# Patient Record
Sex: Male | Born: 1945
Health system: Southern US, Community
[De-identification: ages and names within clinical notes are randomized; demographics above are authoritative.]

## PROBLEM LIST (undated history)

## (undated) DIAGNOSIS — I714 Abdominal aortic aneurysm, without rupture, unspecified: Secondary | ICD-10-CM

## (undated) DIAGNOSIS — F0280 Dementia in other diseases classified elsewhere without behavioral disturbance: Secondary | ICD-10-CM

## (undated) DIAGNOSIS — E785 Hyperlipidemia, unspecified: Secondary | ICD-10-CM

## (undated) DIAGNOSIS — R270 Ataxia, unspecified: Secondary | ICD-10-CM

## (undated) DIAGNOSIS — I10 Essential (primary) hypertension: Secondary | ICD-10-CM

## (undated) DIAGNOSIS — Z8719 Personal history of other diseases of the digestive system: Secondary | ICD-10-CM

## (undated) DIAGNOSIS — E559 Vitamin D deficiency, unspecified: Secondary | ICD-10-CM

## (undated) DIAGNOSIS — G309 Alzheimer's disease, unspecified: Secondary | ICD-10-CM

## (undated) DIAGNOSIS — D649 Anemia, unspecified: Secondary | ICD-10-CM

## (undated) DIAGNOSIS — E119 Type 2 diabetes mellitus without complications: Secondary | ICD-10-CM

## (undated) HISTORY — DX: Essential (primary) hypertension: I10

## (undated) HISTORY — DX: Anemia, unspecified: D64.9

## (undated) HISTORY — PX: TONSILLECTOMY: SUR1361

## (undated) HISTORY — PX: INGUINAL HERNIA REPAIR: SUR1180

## (undated) HISTORY — DX: Type 2 diabetes mellitus without complications: E11.9

## (undated) HISTORY — PX: ERCP: SHX60

## (undated) HISTORY — DX: Hyperlipidemia, unspecified: E78.5

## (undated) HISTORY — DX: Personal history of other diseases of the digestive system: Z87.19

## (undated) HISTORY — DX: Abdominal aortic aneurysm, without rupture: I71.4

## (undated) HISTORY — DX: Vitamin D deficiency, unspecified: E55.9

## (undated) HISTORY — DX: Abdominal aortic aneurysm, without rupture, unspecified: I71.40

## (undated) HISTORY — DX: Ataxia, unspecified: R27.0

---

## 1999-01-14 ENCOUNTER — Ambulatory Visit (HOSPITAL_COMMUNITY): Admission: RE | Admit: 1999-01-14 | Discharge: 1999-01-14 | Payer: Self-pay | Admitting: Critical Care Medicine

## 1999-01-14 ENCOUNTER — Encounter: Payer: Self-pay | Admitting: Critical Care Medicine

## 1999-01-20 ENCOUNTER — Ambulatory Visit: Admission: RE | Admit: 1999-01-20 | Discharge: 1999-01-20 | Payer: Self-pay | Admitting: Critical Care Medicine

## 2009-07-23 ENCOUNTER — Encounter: Admission: RE | Admit: 2009-07-23 | Discharge: 2009-10-21 | Payer: Self-pay | Admitting: Family Medicine

## 2012-04-08 ENCOUNTER — Other Ambulatory Visit: Payer: Self-pay | Admitting: Family Medicine

## 2012-04-08 DIAGNOSIS — R4182 Altered mental status, unspecified: Secondary | ICD-10-CM

## 2012-04-09 ENCOUNTER — Ambulatory Visit (HOSPITAL_COMMUNITY)
Admission: RE | Admit: 2012-04-09 | Discharge: 2012-04-09 | Disposition: A | Discharge status: Home / Self Care | Source: Ambulatory Visit | Attending: Family Medicine | Admitting: Family Medicine

## 2012-04-09 DIAGNOSIS — R4182 Altered mental status, unspecified: Secondary | ICD-10-CM | POA: Insufficient documentation

## 2012-04-09 DIAGNOSIS — G319 Degenerative disease of nervous system, unspecified: Secondary | ICD-10-CM | POA: Insufficient documentation

## 2013-01-07 DIAGNOSIS — E119 Type 2 diabetes mellitus without complications: Secondary | ICD-10-CM | POA: Diagnosis not present

## 2013-02-06 ENCOUNTER — Ambulatory Visit (INDEPENDENT_AMBULATORY_CARE_PROVIDER_SITE_OTHER): Payer: Medicare Other | Admitting: Family Medicine

## 2013-02-06 ENCOUNTER — Encounter: Payer: Self-pay | Admitting: Family Medicine

## 2013-02-06 VITALS — BP 108/65 | HR 67 | Temp 97.9°F | Wt 193.4 lb

## 2013-02-06 DIAGNOSIS — I714 Abdominal aortic aneurysm, without rupture, unspecified: Secondary | ICD-10-CM | POA: Diagnosis not present

## 2013-02-06 DIAGNOSIS — G589 Mononeuropathy, unspecified: Secondary | ICD-10-CM | POA: Diagnosis not present

## 2013-02-06 DIAGNOSIS — Z789 Other specified health status: Secondary | ICD-10-CM

## 2013-02-06 DIAGNOSIS — E559 Vitamin D deficiency, unspecified: Secondary | ICD-10-CM | POA: Diagnosis not present

## 2013-02-06 DIAGNOSIS — G629 Polyneuropathy, unspecified: Secondary | ICD-10-CM

## 2013-02-06 DIAGNOSIS — E538 Deficiency of other specified B group vitamins: Secondary | ICD-10-CM | POA: Diagnosis not present

## 2013-02-06 DIAGNOSIS — E1165 Type 2 diabetes mellitus with hyperglycemia: Secondary | ICD-10-CM | POA: Insufficient documentation

## 2013-02-06 DIAGNOSIS — I10 Essential (primary) hypertension: Secondary | ICD-10-CM | POA: Diagnosis not present

## 2013-02-06 DIAGNOSIS — Z87891 Personal history of nicotine dependence: Secondary | ICD-10-CM | POA: Insufficient documentation

## 2013-02-06 DIAGNOSIS — E785 Hyperlipidemia, unspecified: Secondary | ICD-10-CM

## 2013-02-06 DIAGNOSIS — R683 Clubbing of fingers: Secondary | ICD-10-CM | POA: Diagnosis not present

## 2013-02-06 DIAGNOSIS — E1140 Type 2 diabetes mellitus with diabetic neuropathy, unspecified: Secondary | ICD-10-CM | POA: Insufficient documentation

## 2013-02-06 DIAGNOSIS — Z79899 Other long term (current) drug therapy: Secondary | ICD-10-CM | POA: Diagnosis not present

## 2013-02-06 DIAGNOSIS — E119 Type 2 diabetes mellitus without complications: Secondary | ICD-10-CM | POA: Diagnosis not present

## 2013-02-06 DIAGNOSIS — Z87898 Personal history of other specified conditions: Secondary | ICD-10-CM | POA: Insufficient documentation

## 2013-02-06 LAB — POCT GLYCOSYLATED HEMOGLOBIN (HGB A1C): Hemoglobin A1C: 9

## 2013-02-06 MED ORDER — METFORMIN HCL 1000 MG PO TABS: 1000.0000 mg | ORAL_TABLET | Freq: Two times a day (BID) | ORAL | Status: DC

## 2013-02-06 MED ORDER — SAXAGLIPTIN HCL 2.5 MG PO TABS: 5.0000 mg | ORAL_TABLET | Freq: Every day | ORAL | Status: DC

## 2013-02-06 MED ORDER — INSULIN GLARGINE 100 UNIT/ML SOLOSTAR PEN: 60.0000 [IU] | PEN_INJECTOR | Freq: Every day | SUBCUTANEOUS | Status: DC

## 2013-02-06 MED ORDER — INSULIN PEN NEEDLE 32G X 6 MM MISC: 1.0000 | Freq: Every day | Status: DC

## 2013-02-06 MED ORDER — LISINOPRIL 2.5 MG PO TABS: 2.5000 mg | ORAL_TABLET | Freq: Every day | ORAL | Status: DC

## 2013-02-06 NOTE — Patient Instructions (Addendum)
      Dr Manvi Guilliams's Recommendations  Diet and Exercise discussed with patient.  For nutrition information, I recommend books:  1).Eat to Live by Dr Joel Fuhrman. 2).Prevent and Reverse Heart Disease by Dr Caldwell Esselstyn. 3) Dr Neal Barnard's Book:  Program to Reverse Diabetes  Exercise recommendations are:  If unable to walk, then the patient can exercise in a chair 3 times a day. By flapping arms like a bird gently and raising legs outwards to the front.  If ambulatory, the patient can go for walks for 30 minutes 3 times a week. Then increase the intensity and duration as tolerated.  Goal is to try to attain exercise frequency to 5 times a week.  If applicable: Best to perform resistance exercises (machines or weights) 2 days a week and cardio type exercises 3 days per week.  

## 2013-02-06 NOTE — Progress Notes (Signed)
Patient ID: Jon [name], male   DOB: Jul 18, 1945, 67 y.o.   MRN: [id number] SUBJECTIVE: CC: Chief Complaint  Patient presents with  . Follow-up    non fasting  follow up diabetes  wants wriiten rx 90 day     HPI: Patient is here for follow up of Diabetes Mellitus: Symptoms of DM: Denies Nocturia ,Denies Urinary Frequency , denies Blurred vision ,deniesDizziness,denies.Dysuria,denies paresthesias, denies extremity pain or ulcers.Marland Kitchendenies chest pain. has had an annual eye exam. do check the feet. Does check CBGs. Average CBG: 135-150 Denies episodes of hypoglycemia. Does have an emergency hypoglycemic plan. admits toCompliance with medications. Denies Problems with medications.  Gets a stress test and AAA scanned annually in [place].  Past Medical History  Diagnosis Date  . Diabetes mellitus without complication   . AAA (abdominal aortic aneurysm)   . Hypertension   . Anemia   . History of gallstones    Past Surgical History  Procedure Laterality Date  . Ercp     History   Social History  . Marital Status: Married    Spouse Name: N/A    Number of Children: N/A  . Years of Education: N/A   Occupational History  . Not on file.   Social History Main Topics  . Smoking status: Former Smoker    Quit date: 02/06/2009  . Smokeless tobacco: Not on file  . Alcohol Use: Not on file  . Drug Use: Not on file  . Sexually Active: Not on file   Other Topics Concern  . Not on file   Social History Narrative  . No narrative on file   No family history on file. No current outpatient prescriptions on file prior to visit.   No current facility-administered medications on file prior to visit.   No Known Allergies  There is no immunization history on file for this patient. Prior to Admission medications   Medication Sig Start Date End Date Taking? Authorizing Provider  aspirin 81 MG tablet Take 81 mg by mouth daily.   Yes Historical Provider, MD  Chromium-Cinnamon  50-500 MCG-MG CAPS Take 1 capsule by mouth daily.   Yes Historical Provider, MD  Insulin Glargine (LANTUS SOLOSTAR) 100 UNIT/ML SOPN Inject 60 Units into the skin at bedtime.   Yes Historical Provider, MD  Insulin Pen Needle 32G X 6 MM MISC 1 Device by Does not apply route daily.   Yes Historical Provider, MD  lisinopril (PRINIVIL,ZESTRIL) 2.5 MG tablet Take 2.5 mg by mouth daily.   Yes Historical Provider, MD  metFORMIN (GLUCOPHAGE) 1000 MG tablet Take 1,000 mg by mouth 2 (two) times daily with a meal.   Yes Historical Provider, MD  Milk Thistle 1000 MG CAPS Take 1 capsule by mouth daily.   Yes Historical Provider, MD  Saw Palmetto, Serenoa repens, ([employer] SAW PALMETTO) 450 MG CAPS Take 1 capsule by mouth 2 (two) times daily.   Yes Historical Provider, MD  saxagliptin HCl (ONGLYZA) 2.5 MG TABS tablet Take 5 mg by mouth daily.   Yes Historical Provider, MD      ROS: As above in the HPI. All other systems are stable or negative.  OBJECTIVE: APPEARANCE:  Patient in no acute distress.The patient appeared well nourished and normally developed. Acyanotic. Waist: VITAL SIGNS:BP 108/65  Pulse 67  Temp(Src) 97.9 F (36.6 C) (Oral)  Wt 193 lb 6.4 oz (87.726 kg) WM  SKIN: warm and  Dry without overt rashes, tattoos and scars  HEAD and Neck: without JVD, Head  and scalp: normal Eyes:No scleral icterus. Fundi normal, eye movements normal. Ears: Auricle normal, canal normal, Tympanic membranes normal, insufflation normal. Nose: normal Throat: normal Neck & thyroid: normal  CHEST & LUNGS: Chest wall: normal Lungs: Clear  CVS: Reveals the PMI to be normally located. Regular rhythm, First and Second Heart sounds are normal,  absence of murmurs, rubs or gallops. Peripheral vasculature: Radial pulses: normal Dorsal pedis pulses: normal Posterior pulses: normal Clubbing of the finger tips  ABDOMEN:  Appearance: normal Benign, no organomegaly, no masses, no Abdominal Aortic  enlargement. No Guarding , no rebound. No Bruits. Bowel sounds: normal  RECTAL: N/A GU: N/A  EXTREMETIES: nonedematous. Clubbing of the fingers. Both Femoral and Pedal pulses are normal.  MUSCULOSKELETAL:  Spine: normal Joints: intact Clubbing of the finger nails of both hands.  NEUROLOGIC: oriented to time,place and person; nonfocal. Gait:a little off seen by neurologist. Strength is normal Sensory is abnormal: subjective paresthesias of the feet and arms. Reflexes are normal Cranial Nerves are normal.  ASSESSMENT: DM (diabetes mellitus) - Plan: CMP14+EGFR, POCT glycosylated hemoglobin (Hb A1C), POCT UA - Microalbumin, Insulin Glargine (LANTUS SOLOSTAR) 100 UNIT/ML SOPN, Insulin Pen Needle 32G X 6 MM MISC, metFORMIN (GLUCOPHAGE) 1000 MG tablet, saxagliptin HCl (ONGLYZA) 2.5 MG TABS tablet  HTN (hypertension) - Plan: CMP14+EGFR, lisinopril (PRINIVIL,ZESTRIL) 2.5 MG tablet  AAA (abdominal aortic aneurysm) without rupture  Clubbing of fingers - Plan: CBC With differential/Platelet  Ex-smoker  History of unsteady gait  Neuropathy - Plan: Vitamin B12, Folate, Vitamin D 25 hydroxy, TSH  HLD (hyperlipidemia) - Plan: CMP14+EGFR, NMR, lipoprofile  PLAN: Orders Placed This Encounter  Procedures  . CBC With differential/Platelet  . CMP14+EGFR  . NMR, lipoprofile  . Vitamin B12  . Folate  . Vitamin D 25 hydroxy  . TSH  . POCT glycosylated hemoglobin (Hb A1C)  . POCT UA - Microalbumin    Meds ordered this encounter  Medications  . DISCONTD: metFORMIN (GLUCOPHAGE) 1000 MG tablet    Sig: Take 1,000 mg by mouth 2 (two) times daily with a meal.  . DISCONTD: lisinopril (PRINIVIL,ZESTRIL) 2.5 MG tablet    Sig: Take 2.5 mg by mouth daily.  [name] DISCONTD: saxagliptin HCl (ONGLYZA) 2.5 MG TABS tablet    Sig: Take 5 mg by mouth daily.  [name] DISCONTD: Insulin Glargine (LANTUS SOLOSTAR) 100 UNIT/ML SOPN    Sig: Inject 60 Units into the skin at bedtime.  [name] DISCONTD: Insulin Pen Needle  32G X 6 MM MISC    Sig: 1 Device by Does not apply route daily.  . Saw Palmetto, Serenoa repens, ([employer] SAW PALMETTO) 450 MG CAPS    Sig: Take 1 capsule by mouth 2 (two) times daily.  . Chromium-Cinnamon 50-500 MCG-MG CAPS    Sig: Take 1 capsule by mouth daily.  . Milk Thistle 1000 MG CAPS    Sig: Take 1 capsule by mouth daily.  [name] aspirin 81 MG tablet    Sig: Take 81 mg by mouth daily.  . Insulin Glargine (LANTUS SOLOSTAR) 100 UNIT/ML SOPN    Sig: Inject 60 Units into the skin at bedtime.    Dispense:  15 pen    Refill:  11  . Insulin Pen Needle 32G X 6 MM MISC    Sig: 1 Device by Does not apply route daily.    Dispense:  100 each    Refill:  11  . lisinopril (PRINIVIL,ZESTRIL) 2.5 MG tablet    Sig: Take 1 tablet (2.5 mg total) by mouth daily.  Dispense:  90 tablet    Refill:  3  . metFORMIN (GLUCOPHAGE) 1000 MG tablet    Sig: Take 1 tablet (1,000 mg total) by mouth 2 (two) times daily with a meal.    Dispense:  90 tablet    Refill:  3  . saxagliptin HCl (ONGLYZA) 2.5 MG TABS tablet    Sig: Take 2 tablets (5 mg total) by mouth daily.    Dispense:  90 tablet    Refill:  3         Dr Woodroe Mode Recommendations  Diet and Exercise discussed with patient.  For nutrition information, I recommend books:  1).Eat to Live by Dr Monico Hoar. 2).Prevent and Reverse Heart Disease by Dr Suzzette Righter. 3) Dr Katherina Right Book:  Program to Reverse Diabetes  Exercise recommendations are:  If unable to walk, then the patient can exercise in a chair 3 times a day. By flapping arms like a bird gently and raising legs outwards to the front.  If ambulatory, the patient can go for walks for 30 minutes 3 times a week. Then increase the intensity and duration as tolerated.  Goal is to try to attain exercise frequency to 5 times a week.  If applicable: Best to perform resistance exercises (machines or weights) 2 days a week and cardio type exercises 3 days per week.  Return in  about 3 months (around 05/09/2013) for Recheck medical problems.  Sahiti Joswick P. Modesto Charon, M.D.

## 2013-02-08 LAB — VITAMIN D 25 HYDROXY (VIT D DEFICIENCY, FRACTURES): Vit D, 25-Hydroxy: 28.8 ng/mL — ABNORMAL LOW (ref 30.0–100.0)

## 2013-02-08 LAB — CMP14+EGFR
ALT: 45 IU/L — ABNORMAL HIGH (ref 0–44)
AST: 31 IU/L (ref 0–40)
Albumin/Globulin Ratio: 1.3 (ref 1.1–2.5)
Albumin: 4.1 g/dL (ref 3.6–4.8)
Alkaline Phosphatase: 115 IU/L (ref 39–117)
BUN/Creatinine Ratio: 18 (ref 10–22)
BUN: 17 mg/dL (ref 8–27)
CO2: 22 mmol/L (ref 18–29)
Calcium: 9.2 mg/dL (ref 8.6–10.2)
Chloride: 102 mmol/L (ref 97–108)
Creatinine, Ser: 0.94 mg/dL (ref 0.76–1.27)
GFR calc Af Amer: 98 mL/min/{1.73_m2} (ref >59)
GFR calc non Af Amer: 85 mL/min/{1.73_m2} (ref >59)
Globulin, Total: 3.1 g/dL (ref 1.5–4.5)
Glucose: 140 mg/dL — ABNORMAL HIGH (ref 65–99)
Potassium: 4.5 mmol/L (ref 3.5–5.2)
Sodium: 139 mmol/L (ref 134–144)
Total Bilirubin: 0.4 mg/dL (ref 0.0–1.2)
Total Protein: 7.2 g/dL (ref 6.0–8.5)

## 2013-02-08 LAB — NMR, LIPOPROFILE
Cholesterol: 175 mg/dL (ref <200)
HDL Cholesterol by NMR: 54 mg/dL (ref >=40)
HDL Particle Number: 34.7 umol/L (ref >=30.5)
LDL Particle Number: 1362 nmol/L — ABNORMAL HIGH (ref <1000)
LDL Size: 20.6 nm (ref >20.5)
LDLC SERPL CALC-MCNC: 100 mg/dL — ABNORMAL HIGH (ref <100)
LP-IR Score: 57 — ABNORMAL HIGH (ref <=45)
Small LDL Particle Number: 704 nmol/L — ABNORMAL HIGH (ref <=527)
Triglycerides by NMR: 106 mg/dL (ref <150)

## 2013-02-08 LAB — CBC WITH DIFFERENTIAL
Basophils Absolute: 0 10*3/uL (ref 0.0–0.2)
Basos: 0 % (ref 0–3)
Eos: 1 % (ref 0–5)
Eosinophils Absolute: 0.1 10*3/uL (ref 0.0–0.4)
HCT: 43.9 % (ref 37.5–51.0)
Hemoglobin: 15.4 g/dL (ref 12.6–17.7)
Immature Grans (Abs): 0 10*3/uL (ref 0.0–0.1)
Immature Granulocytes: 0 % (ref 0–2)
Lymphocytes Absolute: 3.5 10*3/uL — ABNORMAL HIGH (ref 0.7–3.1)
Lymphs: 33 % (ref 14–46)
MCH: 28.7 pg (ref 26.6–33.0)
MCHC: 35.1 g/dL (ref 31.5–35.7)
MCV: 82 fL (ref 79–97)
Monocytes Absolute: 0.9 10*3/uL (ref 0.1–0.9)
Monocytes: 8 % (ref 4–12)
Neutrophils Absolute: 6.1 10*3/uL (ref 1.4–7.0)
Neutrophils Relative %: 58 % (ref 40–74)
Platelets: 202 10*3/uL (ref 150–379)
RBC: 5.37 x10E6/uL (ref 4.14–5.80)
RDW: 14 % (ref 12.3–15.4)
WBC: 10.8 10*3/uL (ref 3.4–10.8)

## 2013-02-08 LAB — TSH: TSH: 2.68 u[IU]/mL (ref 0.450–4.500)

## 2013-02-08 LAB — FOLATE: Folate: 8.8 ng/mL (ref >3.0)

## 2013-02-08 LAB — VITAMIN B12: Vitamin B-12: 436 pg/mL (ref 211–946)

## 2013-02-27 ENCOUNTER — Telehealth: Payer: Self-pay | Admitting: Family Medicine

## 2013-02-27 NOTE — Telephone Encounter (Signed)
Agree with neurology appointment. Raymir Frommelt P. Modesto Charon, M.D.

## 2013-02-27 NOTE — Telephone Encounter (Signed)
Spoke with wife and she is concerned about his mental altered status and she states he has seen a neurologist . States he cant "figure things out ." Stated he is alert and oriented x 3. But worried about him. Advised to call and sch appt with his neurologist and will let dr Modesto Charon know

## 2013-03-19 ENCOUNTER — Ambulatory Visit (INDEPENDENT_AMBULATORY_CARE_PROVIDER_SITE_OTHER): Payer: Medicare Other | Admitting: Pharmacist

## 2013-03-19 ENCOUNTER — Encounter: Payer: Self-pay | Admitting: Pharmacist

## 2013-03-19 VITALS — BP 116/80 | HR 72 | Ht 72.0 in | Wt 194.0 lb

## 2013-03-19 DIAGNOSIS — R2681 Unsteadiness on feet: Secondary | ICD-10-CM

## 2013-03-19 DIAGNOSIS — E119 Type 2 diabetes mellitus without complications: Secondary | ICD-10-CM

## 2013-03-19 DIAGNOSIS — I10 Essential (primary) hypertension: Secondary | ICD-10-CM

## 2013-03-19 DIAGNOSIS — E785 Hyperlipidemia, unspecified: Secondary | ICD-10-CM

## 2013-03-19 MED ORDER — INSULIN PEN NEEDLE 32G X 4 MM MISC: Status: DC

## 2013-03-19 MED ORDER — GLUCOSE BLOOD VI STRP: ORAL_STRIP | Status: DC

## 2013-03-19 MED ORDER — INSULIN GLARGINE 100 UNIT/ML SOLOSTAR PEN: 60.0000 [IU] | PEN_INJECTOR | Freq: Every day | SUBCUTANEOUS | Status: DC

## 2013-03-19 MED ORDER — SAXAGLIPTIN HCL 5 MG PO TABS: 5.0000 mg | ORAL_TABLET | Freq: Every day | ORAL | Status: DC

## 2013-03-19 NOTE — Progress Notes (Signed)
Diabetes Flow Sheet:  Visit 1  Chief Complaint:   Chief Complaint  Patient presents with  . Diabetes  . Hyperlipidemia     Filed Vitals:   03/19/13 1321  BP: 116/80  Pulse: 72   Filed Weights   03/19/13 1321  Weight: 194 lb (87.998 kg)   HPI:  First visit for diabetes with clinical pharmacist.   A1C was 9.0% - per patient he was eating lots of snack and donuts.  Also LDL-P was eleaveted, triglycerides were normal.    Lantus 60 units Qhs, onzyza 5mg  daily and metformin 1000mg  bid  Exam Edema:  negative  Polyuria:  positive  Polydipsia:  negative Polyphagia:  negative  BMI:  Body mass index is 26.31 kg/(m^2).   Weight changes:  stable General Appearance:  alert, oriented, no acute distress, obese and postive abdominal obesity Mood/Affect:  normal and very talkative.    Low fat/carbohydrate diet?  No Nicotine Abuse?  No Medication Compliance?  Yes Exercise?  No Alcohol Abuse?  No  Checks BG a few times a week, usually only in the morning.  Am 110 - 130.  Lab Results  Component Value Date   HGBA1C 9.0 02/06/2013    Lab Results  Component Value Date   CHOL 175 02/06/2013     Medication Checklist: ACE Inhibitor/ARB?  yes Lipid Lowering Agent?  No Aspirin?  Yes Oral Hypoglycemic Agent(s)?  Yes  Assessment: 1.  type 2 Diabetes.  Uncontrolled and non compliant with ADA CHO counting diet.  2.  Blood Pressure Control.  Normal today 3.  Lipid Control.  Elevated LDL-P  Recommendations: 1.  1800 calorie, carbohydrate counting diet.  Patient is counseled extensively on carbohydrate counting, serving sizes, saturated fat intake and meal planning.  Patient is instructed to eat 3 meals a day and 3 small snacks.  Patient will supplement snacks based on physical activity. 2.  30 minutes of physical activity.  Patient is counseled to always carry glucose tablets, lifesavers, hard candies, etc., while exercising in case of hypoglycemic event. 3.  Patient is counseled on  pathophysiology of diabetes and the risk of long-term complications.  Fasting blood glucose goals are 80-120mg /dL.  Post-prandial goals are < 140.  A1C goals < 7.0%. 4.  LDL goal of < 100, HDL > 40 and TG < 150; BP goal < 130/80 5.  Patient is counseled on proper use of glucometer and lancing device.  Patient instructed to check BG 1 - 2 times daily a varying times and how to respond to unsuitable results. 6.  Medication recommendations at this time are as follows:    Discussed changes and patient refused - feels that diet was main cause of elevated BG.    RTC in 1 month to reassess BG control.  NOTE - patient's wife asked for referral to neurologist for second opinion due to unsteady gait, clubbing of fingernails and changes in memory and comprehension.    Time spent counseling patient:  45 minutes Referring provider:  Modesto Charon   PharmD:  Henrene Pastor, [facility]

## 2013-03-19 NOTE — Patient Instructions (Signed)
Blood Glucose / Sugar Goals - check blood glucose 2 times a day  Fasting 80 to 130  Within 2 hours of start of a meal - less than 180

## 2013-03-31 ENCOUNTER — Telehealth: Payer: Self-pay | Admitting: Family Medicine

## 2013-03-31 NOTE — Telephone Encounter (Signed)
Spoke with Jon [name] in referrals.  Patient has been referred to Dr Jon [name] a neurologist with [name] in [place].  Jon [name] sent referral prior auth to [employer] and we are waiting to hear if it was approved.   Expect to hear in 7 to 14 days (sent 03/24/13). Patient's wife instructed to call our office if they receive letter about approval (we should receive letter, Dr Jon [name] will receive a letter and patient will receive a letter).

## 2013-04-09 ENCOUNTER — Encounter: Payer: Self-pay | Admitting: Family Medicine

## 2013-04-09 ENCOUNTER — Telehealth: Payer: Self-pay | Admitting: Pharmacist

## 2013-04-09 DIAGNOSIS — E119 Type 2 diabetes mellitus without complications: Secondary | ICD-10-CM

## 2013-04-09 MED ORDER — SAXAGLIPTIN HCL 5 MG PO TABS: 5.0000 mg | ORAL_TABLET | Freq: Every day | ORAL | Status: DC

## 2013-04-09 MED ORDER — INSULIN GLARGINE 100 UNIT/ML SOLOSTAR PEN: 60.0000 [IU] | PEN_INJECTOR | Freq: Every day | SUBCUTANEOUS | Status: DC

## 2013-04-09 MED ORDER — INSULIN PEN NEEDLE 32G X 4 MM MISC: Status: DC

## 2013-04-09 MED ORDER — GLUCOSE BLOOD VI STRP: ORAL_STRIP | Status: DC

## 2013-04-09 NOTE — Telephone Encounter (Signed)
Spoke with patient - his wife actually just found rx's Cancelled Rx's printed today.

## 2013-04-11 DIAGNOSIS — R209 Unspecified disturbances of skin sensation: Secondary | ICD-10-CM | POA: Diagnosis not present

## 2013-04-11 DIAGNOSIS — R269 Unspecified abnormalities of gait and mobility: Secondary | ICD-10-CM | POA: Diagnosis not present

## 2013-04-11 DIAGNOSIS — R413 Other amnesia: Secondary | ICD-10-CM | POA: Diagnosis not present

## 2013-04-29 DIAGNOSIS — R413 Other amnesia: Secondary | ICD-10-CM | POA: Diagnosis not present

## 2013-05-08 ENCOUNTER — Other Ambulatory Visit: Payer: Self-pay

## 2013-05-09 ENCOUNTER — Ambulatory Visit (INDEPENDENT_AMBULATORY_CARE_PROVIDER_SITE_OTHER): Payer: Medicare Other | Admitting: Family Medicine

## 2013-05-09 ENCOUNTER — Encounter: Payer: Self-pay | Admitting: Family Medicine

## 2013-05-09 VITALS — BP 109/67 | HR 64 | Temp 98.7°F | Ht 72.0 in | Wt 190.2 lb

## 2013-05-09 DIAGNOSIS — Z87898 Personal history of other specified conditions: Secondary | ICD-10-CM

## 2013-05-09 DIAGNOSIS — I714 Abdominal aortic aneurysm, without rupture, unspecified: Secondary | ICD-10-CM | POA: Diagnosis not present

## 2013-05-09 DIAGNOSIS — E559 Vitamin D deficiency, unspecified: Secondary | ICD-10-CM | POA: Diagnosis not present

## 2013-05-09 DIAGNOSIS — E119 Type 2 diabetes mellitus without complications: Secondary | ICD-10-CM | POA: Diagnosis not present

## 2013-05-09 DIAGNOSIS — E785 Hyperlipidemia, unspecified: Secondary | ICD-10-CM

## 2013-05-09 DIAGNOSIS — Z87891 Personal history of nicotine dependence: Secondary | ICD-10-CM

## 2013-05-09 DIAGNOSIS — R683 Clubbing of fingers: Secondary | ICD-10-CM

## 2013-05-09 DIAGNOSIS — Z789 Other specified health status: Secondary | ICD-10-CM

## 2013-05-09 DIAGNOSIS — I10 Essential (primary) hypertension: Secondary | ICD-10-CM | POA: Diagnosis not present

## 2013-05-09 LAB — POCT UA - MICROALBUMIN: Microalbumin Ur, POC: POSITIVE mg/L

## 2013-05-09 LAB — POCT GLYCOSYLATED HEMOGLOBIN (HGB A1C): Hemoglobin A1C: 7.5

## 2013-05-09 MED ORDER — INSULIN GLARGINE 100 UNIT/ML SOLOSTAR PEN: 60.0000 [IU] | PEN_INJECTOR | Freq: Every day | SUBCUTANEOUS | Status: DC

## 2013-05-09 NOTE — Patient Instructions (Addendum)
    Dr Hamsa Laurich's Recommendations  For nutrition information, I recommend books:  1).Eat to Live by Dr Joel Fuhrman. 2).Prevent and Reverse Heart Disease by Dr Caldwell Esselstyn. 3) Dr Neal Barnard's Book:  Program to Reverse Diabetes  Exercise recommendations are:  If unable to walk, then the patient can exercise in a chair 3 times a day. By flapping arms like a bird gently and raising legs outwards to the front.  If ambulatory, the patient can go for walks for 30 minutes 3 times a week. Then increase the intensity and duration as tolerated.  Goal is to try to attain exercise frequency to 5 times a week.  If applicable: Best to perform resistance exercises (machines or weights) 2 days a week and cardio type exercises 3 days per week.   Diabetes and Foot Care Diabetes may cause you to have problems because of poor blood supply (circulation) to your feet and legs. This may cause the skin on your feet to become thinner, break easier, and heal more slowly. Your skin may become dry, and the skin may peel and crack. You may also have nerve damage in your legs and feet causing decreased feeling in them. You may not notice minor injuries to your feet that could lead to infections or more serious problems. Taking care of your feet is one of the most important things you can do for yourself.  HOME CARE INSTRUCTIONS  Wear shoes at all times, even in the house. Do not go barefoot. Bare feet are easily injured.  Check your feet daily for blisters, cuts, and redness. If you cannot see the bottom of your feet, use a mirror or ask someone for help.  Wash your feet with warm water (do not use hot water) and mild soap. Then pat your feet and the areas between your toes until they are completely dry. Do not soak your feet as this can dry your skin.  Apply a moisturizing lotion or petroleum jelly (that does not contain alcohol and is unscented) to the skin on your feet and to dry, brittle toenails.  Do not apply lotion between your toes.  Trim your toenails straight across. Do not dig under them or around the cuticle. File the edges of your nails with an emery board or nail file.  Do not cut corns or calluses or try to remove them with medicine.  Wear clean socks or stockings every day. Make sure they are not too tight. Do not wear knee-high stockings since they may decrease blood flow to your legs.  Wear shoes that fit properly and have enough cushioning. To break in new shoes, wear them for just a few hours a day. This prevents you from injuring your feet. Always look in your shoes before you put them on to be sure there are no objects inside.  Do not cross your legs. This may decrease the blood flow to your feet.  If you find a minor scrape, cut, or break in the skin on your feet, keep it and the skin around it clean and dry. These areas may be cleansed with mild soap and water. Do not cleanse the area with peroxide, alcohol, or iodine.  When you remove an adhesive bandage, be sure not to damage the skin around it.  If you have a wound, look at it several times a day to make sure it is healing.  Do not use heating pads or hot water bottles. They may burn your skin. If   you have lost feeling in your feet or legs, you may not know it is happening until it is too late.  Make sure your health care provider performs a complete foot exam at least annually or more often if you have foot problems. Report any cuts, sores, or bruises to your health care provider immediately. SEEK MEDICAL CARE IF:   You have an injury that is not healing.  You have cuts or breaks in the skin.  You have an ingrown nail.  You notice redness on your legs or feet.  You feel burning or tingling in your legs or feet.  You have pain or cramps in your legs and feet.  Your legs or feet are numb.  Your feet always feel cold. SEEK IMMEDIATE MEDICAL CARE IF:   There is increasing redness, swelling, or pain in  or around a wound.  There is a red line that goes up your leg.  Pus is coming from a wound.  You develop a fever or as directed by your health care provider.  You notice a bad smell coming from an ulcer or wound. Document Released: 06/16/2000 Document Revised: 02/19/2013 Document Reviewed: 11/26/2012 [employer] Patient Information 2014 [employer].  

## 2013-05-09 NOTE — Addendum Note (Signed)
Addended by: Lisbeth Ply C on: 05/09/2013 11:29 AM   Modules accepted: Orders

## 2013-05-09 NOTE — Progress Notes (Signed)
SUBJECTIVE: CC: Chief Complaint  Patient presents with  . Follow-up    3 month follow up chronic problems wants lantus pen corrected     HPI: Patient is here for follow up of Diabetes Mellitus: Symptoms evaluated: Denies Nocturia ,Denies Urinary Frequency , denies Blurred vision ,deniesDizziness,denies.Dysuria,denies paresthesias, denies extremity pain or ulcers.Jon Kitchendenies chest pain. has had an annual eye exam. do check the feet. Does check CBGs. Average CBG:130-135 Denies episodes of hypoglycemia. Does have an emergency hypoglycemic plan. admits toCompliance with medications. Denies Problems with medications.  Saw neurologist: had work up. Awaiting results. Breakfast: eggs & Sausage, bubbles& sweets (cabbage & potatoes) Lunch: nothing Supper: 2 pizzas Past Medical History  Diagnosis Date  . Diabetes mellitus without complication   . AAA (abdominal aortic aneurysm)   . Hypertension   . Anemia   . History of gallstones   . Vitamin D deficiency    Past Surgical History  Procedure Laterality Date  . Ercp     History   Social History  . Marital Status: Married    Spouse Name: N/A    Number of Children: N/A  . Years of Education: N/A   Occupational History  . Not on file.   Social History Main Topics  . Smoking status: Former Smoker    Quit date: 02/06/2009  . Smokeless tobacco: Not on file  . Alcohol Use: Not on file  . Drug Use: Not on file  . Sexual Activity: Not on file   Other Topics Concern  . Not on file   Social History Narrative  . No narrative on file   No family history on file. Current Outpatient Prescriptions on File Prior to Visit  Medication Sig Dispense Refill  . aspirin 81 MG tablet Take 81 mg by mouth daily.      . Chromium-Cinnamon 50-500 MCG-MG CAPS Take 1 capsule by mouth daily.      Jon Kitchen glucose blood (ACCU-CHEK SMARTVIEW) test strip Use to check BG twice a day.  DX:  250.02 uncontrolled type 2 diabetes.  200 each  2  . Insulin Pen  Needle 32G X 4 MM MISC Use with Lantus Solostar pen daily  100 each  2  . lisinopril (PRINIVIL,ZESTRIL) 2.5 MG tablet Take 1 tablet (2.5 mg total) by mouth daily.  90 tablet  3  . metFORMIN (GLUCOPHAGE) 1000 MG tablet Take 1 tablet (1,000 mg total) by mouth 2 (two) times daily with a meal.  90 tablet  3  . Milk Thistle 1000 MG CAPS Take 1 capsule by mouth daily.      . Saw Palmetto, Serenoa repens, ([facility] SAW PALMETTO) 450 MG CAPS Take 1 capsule by mouth 2 (two) times daily.      . saxagliptin HCl (ONGLYZA) 5 MG TABS tablet Take 1 tablet (5 mg total) by mouth daily.  90 tablet  1   No current facility-administered medications on file prior to visit.   No Known Allergies  There is no immunization history on file for this patient. Prior to Admission medications   Medication Sig Start Date End Date Taking? Authorizing Provider  aspirin 81 MG tablet Take 81 mg by mouth daily.    Historical Provider, MD  Chromium-Cinnamon 50-500 MCG-MG CAPS Take 1 capsule by mouth daily.    Historical Provider, MD  glucose blood (ACCU-CHEK SMARTVIEW) test strip Use to check BG twice a day.  DX:  250.02 uncontrolled type 2 diabetes. 04/09/13   Tammy Eckard, PHARMD  Insulin Glargine (LANTUS SOLOSTAR) 100 UNIT/ML  SOPN Inject 60 Units into the skin at bedtime. 04/09/13   Tammy Eckard, PHARMD  Insulin Pen Needle 32G X 4 MM MISC Use with Lantus Solostar pen daily 04/09/13   Tammy Eckard, PHARMD  lisinopril (PRINIVIL,ZESTRIL) 2.5 MG tablet Take 1 tablet (2.5 mg total) by mouth daily. 02/06/13   Ileana Ladd, MD  metFORMIN (GLUCOPHAGE) 1000 MG tablet Take 1 tablet (1,000 mg total) by mouth 2 (two) times daily with a meal. 02/06/13   Ileana Ladd, MD  Milk Thistle 1000 MG CAPS Take 1 capsule by mouth daily.    Historical Provider, MD  Saw Palmetto, Serenoa repens, ([employer] SAW PALMETTO) 450 MG CAPS Take 1 capsule by mouth 2 (two) times daily.    Historical Provider, MD  saxagliptin HCl (ONGLYZA) 5 MG TABS tablet Take 1 tablet (5 mg  total) by mouth daily. 04/09/13   Tammy Eckard, PHARMD     ROS: As above in the HPI. All other systems are stable or negative.  OBJECTIVE: APPEARANCE:  Patient in no acute distress.The patient appeared well nourished and normally developed. Acyanotic. Waist: VITAL SIGNS:BP 109/67  Pulse 64  Temp(Src) 98.7 F (37.1 C) (Oral)  Ht 6' (1.829 m)  Wt 190 lb 3.2 oz (86.274 kg)  BMI 25.79 kg/m2 WM  SKIN: warm and  Dry without overt rashes, tattoos and scars  HEAD and Neck: without JVD, Head and scalp: normal Eyes:No scleral icterus. Fundi normal, eye movements normal. Ears: Auricle normal, canal normal, Tympanic membranes normal, insufflation normal. Nose: normal Throat: normal Neck & thyroid: normal  CHEST & LUNGS: Chest wall: normal Lungs: Clear  CVS: Reveals the PMI to be normally located. Regular rhythm, First and Second Heart sounds are normal,  absence of murmurs, rubs or gallops. Peripheral vasculature: Radial pulses: normal Dorsal pedis pulses: normal Posterior pulses: normal  ABDOMEN:  Appearance: normal Benign, no organomegaly, no masses, no Abdominal Aortic enlargement. No Guarding , no rebound. No Bruits. Bowel sounds: normal  RECTAL: N/A GU: N/A  EXTREMETIES: nonedematous.  MUSCULOSKELETAL:  Spine: normal Joints: intact  NEUROLOGIC: oriented to time,place and person; nonfocal. Strength is normal Sensory is normal.DM foot exam: intact Reflexes are normal Cranial Nerves are normal.  ASSESSMENT:  DM (diabetes mellitus) - Plan: POCT glycosylated hemoglobin (Hb A1C), POCT UA - Microalbumin, CMP14+EGFR, Insulin Glargine (LANTUS SOLOSTAR) 100 UNIT/ML SOPN, DISCONTINUED: Insulin Glargine (LANTUS SOLOSTAR) 100 UNIT/ML SOPN  HTN (hypertension) - Plan: CMP14+EGFR  AAA (abdominal aortic aneurysm) without rupture  History of unsteady gait  Clubbing of fingers  Ex-smoker  Hyperlipidemia - Plan: NMR, lipoprofile  Vitamin D deficiency - Plan: Vit D   25 hydroxy (rtn osteoporosis monitoring) Work up in progress with neurologist for memory impairment.  PLAN:      Dr Woodroe Mode Recommendations  For nutrition information, I recommend books:  1).Eat to Live by Dr Monico Hoar. 2).Prevent and Reverse Heart Disease by Dr Suzzette Righter. 3) Dr Katherina Right Book:  Program to Reverse Diabetes  Exercise recommendations are:  If unable to walk, then the patient can exercise in a chair 3 times a day. By flapping arms like a bird gently and raising legs outwards to the front.  If ambulatory, the patient can go for walks for 30 minutes 3 times a week. Then increase the intensity and duration as tolerated.  Goal is to try to attain exercise frequency to 5 times a week.  If applicable: Best to perform resistance exercises (machines or weights) 2 days a week and cardio type  exercises 3 days per week.  Orders Placed This Encounter  Procedures  . CMP14+EGFR  . NMR, lipoprofile  . Vit D  25 hydroxy (rtn osteoporosis monitoring)  . POCT glycosylated hemoglobin (Hb A1C)  . POCT UA - Microalbumin   Meds ordered this encounter  Medications  . DISCONTD: Insulin Glargine (LANTUS SOLOSTAR) 100 UNIT/ML SOPN    Sig: Inject 60 Units into the skin at bedtime.    Dispense:  63 mL    Refill:  3  . Insulin Glargine (LANTUS SOLOSTAR) 100 UNIT/ML SOPN    Sig: Inject 60 Units into the skin at bedtime.    Dispense:  63 mL    Refill:  3   Medications Discontinued During This Encounter  Medication Reason  . Insulin Glargine (LANTUS SOLOSTAR) 100 UNIT/ML SOPN Reorder  . Insulin Glargine (LANTUS SOLOSTAR) 100 UNIT/ML SOPN Reorder   Return in about 3 months (around 08/09/2013) for Recheck medical problems.  Jon Hark P. [place] Charon, M.D.

## 2013-05-10 LAB — MICROALBUMIN, URINE: Microalbumin, Urine: 30.6 ug/mL — ABNORMAL HIGH (ref 0.0–17.0)

## 2013-05-11 LAB — CMP14+EGFR
ALT: 27 IU/L (ref 0–44)
AST: 26 IU/L (ref 0–40)
Albumin/Globulin Ratio: 1.5 (ref 1.1–2.5)
Albumin: 4.5 g/dL (ref 3.6–4.8)
Alkaline Phosphatase: 117 IU/L (ref 39–117)
BUN/Creatinine Ratio: 17 (ref 10–22)
BUN: 17 mg/dL (ref 8–27)
CO2: 22 mmol/L (ref 18–29)
Calcium: 9.7 mg/dL (ref 8.6–10.2)
Chloride: 100 mmol/L (ref 97–108)
Creatinine, Ser: 1.03 mg/dL (ref 0.76–1.27)
GFR calc Af Amer: 88 mL/min/{1.73_m2} (ref >59)
GFR calc non Af Amer: 76 mL/min/{1.73_m2} (ref >59)
Globulin, Total: 3.1 g/dL (ref 1.5–4.5)
Glucose: 84 mg/dL (ref 65–99)
Potassium: 4.4 mmol/L (ref 3.5–5.2)
Sodium: 138 mmol/L (ref 134–144)
Total Bilirubin: 0.4 mg/dL (ref 0.0–1.2)
Total Protein: 7.6 g/dL (ref 6.0–8.5)

## 2013-05-11 LAB — NMR, LIPOPROFILE
Cholesterol: 182 mg/dL (ref <200)
HDL Cholesterol by NMR: 57 mg/dL (ref >=40)
HDL Particle Number: 35.3 umol/L (ref >=30.5)
LDL Particle Number: 1390 nmol/L — ABNORMAL HIGH (ref <1000)
LDL Size: 20.8 nm (ref >20.5)
LDLC SERPL CALC-MCNC: 106 mg/dL — ABNORMAL HIGH (ref <100)
LP-IR Score: 48 — ABNORMAL HIGH (ref <=45)
Small LDL Particle Number: 704 nmol/L — ABNORMAL HIGH (ref <=527)
Triglycerides by NMR: 97 mg/dL (ref <150)

## 2013-05-11 LAB — VITAMIN D 25 HYDROXY (VIT D DEFICIENCY, FRACTURES): Vit D, 25-Hydroxy: 29.4 ng/mL — ABNORMAL LOW (ref 30.0–100.0)

## 2013-05-15 DIAGNOSIS — F09 Unspecified mental disorder due to known physiological condition: Secondary | ICD-10-CM | POA: Insufficient documentation

## 2013-05-15 DIAGNOSIS — R209 Unspecified disturbances of skin sensation: Secondary | ICD-10-CM | POA: Diagnosis not present

## 2013-05-15 DIAGNOSIS — R269 Unspecified abnormalities of gait and mobility: Secondary | ICD-10-CM | POA: Diagnosis not present

## 2013-09-13 ENCOUNTER — Other Ambulatory Visit: Payer: Self-pay | Admitting: Pharmacist

## 2013-10-01 DIAGNOSIS — I714 Abdominal aortic aneurysm, without rupture, unspecified: Secondary | ICD-10-CM | POA: Diagnosis not present

## 2013-10-01 DIAGNOSIS — E7800 Pure hypercholesterolemia, unspecified: Secondary | ICD-10-CM | POA: Diagnosis not present

## 2013-10-01 DIAGNOSIS — E1159 Type 2 diabetes mellitus with other circulatory complications: Secondary | ICD-10-CM | POA: Diagnosis not present

## 2013-10-01 DIAGNOSIS — I6529 Occlusion and stenosis of unspecified carotid artery: Secondary | ICD-10-CM | POA: Diagnosis not present

## 2013-10-01 DIAGNOSIS — I798 Other disorders of arteries, arterioles and capillaries in diseases classified elsewhere: Secondary | ICD-10-CM | POA: Diagnosis not present

## 2013-10-01 DIAGNOSIS — I10 Essential (primary) hypertension: Secondary | ICD-10-CM | POA: Diagnosis not present

## 2013-10-01 DIAGNOSIS — I658 Occlusion and stenosis of other precerebral arteries: Secondary | ICD-10-CM | POA: Diagnosis not present

## 2013-10-21 ENCOUNTER — Telehealth: Payer: Self-pay | Admitting: Family Medicine

## 2013-10-21 DIAGNOSIS — E119 Type 2 diabetes mellitus without complications: Secondary | ICD-10-CM

## 2013-10-23 ENCOUNTER — Other Ambulatory Visit: Payer: Self-pay | Admitting: Pharmacist

## 2013-10-23 MED ORDER — METFORMIN HCL 1000 MG PO TABS: 1000.0000 mg | ORAL_TABLET | Freq: Two times a day (BID) | ORAL | Status: DC

## 2013-10-23 NOTE — Telephone Encounter (Signed)
Per Paulene Floormary martin ok to refill for #60 for a month supply until seen with wong

## 2013-10-24 ENCOUNTER — Other Ambulatory Visit: Payer: Self-pay | Admitting: Family Medicine

## 2013-10-24 NOTE — Telephone Encounter (Signed)
Apparently patient dismissed. Refill denied. .[name]

## 2013-10-24 NOTE — Telephone Encounter (Signed)
This has already been taken care of and has been sent over

## 2013-11-06 ENCOUNTER — Encounter: Payer: Self-pay | Admitting: Family Medicine

## 2013-11-06 ENCOUNTER — Ambulatory Visit (INDEPENDENT_AMBULATORY_CARE_PROVIDER_SITE_OTHER): Payer: Medicare Other | Admitting: Family Medicine

## 2013-11-06 VITALS — BP 108/65 | HR 61 | Temp 97.6°F | Ht 72.0 in | Wt 189.6 lb

## 2013-11-06 DIAGNOSIS — I714 Abdominal aortic aneurysm, without rupture, unspecified: Secondary | ICD-10-CM

## 2013-11-06 DIAGNOSIS — Z87898 Personal history of other specified conditions: Secondary | ICD-10-CM

## 2013-11-06 DIAGNOSIS — E559 Vitamin D deficiency, unspecified: Secondary | ICD-10-CM

## 2013-11-06 DIAGNOSIS — R683 Clubbing of fingers: Secondary | ICD-10-CM

## 2013-11-06 DIAGNOSIS — I10 Essential (primary) hypertension: Secondary | ICD-10-CM | POA: Diagnosis not present

## 2013-11-06 DIAGNOSIS — Z9189 Other specified personal risk factors, not elsewhere classified: Secondary | ICD-10-CM

## 2013-11-06 DIAGNOSIS — E119 Type 2 diabetes mellitus without complications: Secondary | ICD-10-CM | POA: Diagnosis not present

## 2013-11-06 DIAGNOSIS — E785 Hyperlipidemia, unspecified: Secondary | ICD-10-CM | POA: Diagnosis not present

## 2013-11-06 DIAGNOSIS — Z87891 Personal history of nicotine dependence: Secondary | ICD-10-CM

## 2013-11-06 LAB — HEMOGLOBIN A1C: HEMOGLOBIN A1C: 7.5 % — AB (ref 4.0–6.0)

## 2013-11-06 LAB — POCT GLYCOSYLATED HEMOGLOBIN (HGB A1C): Hemoglobin A1C: 7.5

## 2013-11-06 MED ORDER — METFORMIN HCL 1000 MG PO TABS: 1000.0000 mg | ORAL_TABLET | Freq: Two times a day (BID) | ORAL | Status: DC

## 2013-11-06 MED ORDER — INSULIN GLARGINE 100 UNIT/ML SOLOSTAR PEN: 60.0000 [IU] | PEN_INJECTOR | Freq: Every day | SUBCUTANEOUS | Status: DC

## 2013-11-06 MED ORDER — SAXAGLIPTIN HCL 5 MG PO TABS: 5.0000 mg | ORAL_TABLET | Freq: Every day | ORAL | Status: DC

## 2013-11-06 MED ORDER — LISINOPRIL 2.5 MG PO TABS: 2.5000 mg | ORAL_TABLET | Freq: Every day | ORAL | Status: DC

## 2013-11-06 NOTE — Progress Notes (Signed)
Patient ID: Jon [name], male   DOB: 15-Jul-1945, 68 y.o.   MRN: [id number] SUBJECTIVE: CC: Chief Complaint  Patient presents with  . Diabetes    6 month recheck  . Hypertension  . Hyperlipidemia    HPI: Patient is here for follow up of Diabetes Mellitus/HLD/HTN/Vit D Def: Symptoms evaluated: Denies Nocturia ,Denies Urinary Frequency , denies Blurred vision ,deniesDizziness,denies.Dysuria,denies paresthesias, denies extremity pain or ulcers.Marland Kitchendenies chest pain. has had an annual eye exam. do check the feet. Does check CBGs. Average CBG:has been stable. Denies episodes of hypoglycemia. Does have an emergency hypoglycemic plan. admits toCompliance with medications. Denies Problems with medications.  Past Medical History  Diagnosis Date  . Diabetes mellitus without complication   . AAA (abdominal aortic aneurysm)   . Hypertension   . Anemia   . History of gallstones   . Vitamin D deficiency    Past Surgical History  Procedure Laterality Date  . Ercp     History   Social History  . Marital Status: Married    Spouse Name: N/A    Number of Children: N/A  . Years of Education: N/A   Occupational History  . Not on file.   Social History Main Topics  . Smoking status: Former Smoker    Quit date: 02/06/2009  . Smokeless tobacco: Not on file  . Alcohol Use: Not on file  . Drug Use: Not on file  . Sexual Activity: Not on file   Other Topics Concern  . Not on file   Social History Narrative  . No narrative on file   No family history on file. Current Outpatient Prescriptions on File Prior to Visit  Medication Sig Dispense Refill  . aspirin 81 MG tablet Take 81 mg by mouth daily.      [name] glucose blood (ACCU-CHEK SMARTVIEW) test strip Use to check BG twice a day.  DX:  250.02 uncontrolled type 2 diabetes.  200 each  2  . Insulin Pen Needle 32G X 4 MM MISC Use with Lantus Solostar pen daily  100 each  2   No current facility-administered medications on file prior to  visit.   No Known Allergies  There is no immunization history on file for this patient. Prior to Admission medications   Medication Sig Start Date End Date Taking? Authorizing Provider  aspirin 81 MG tablet Take 81 mg by mouth daily.   Yes Historical Provider, MD  glucose blood (ACCU-CHEK SMARTVIEW) test strip Use to check BG twice a day.  DX:  250.02 uncontrolled type 2 diabetes. 04/09/13  Yes Tammy Eckard, PHARMD  Insulin Glargine (LANTUS SOLOSTAR) 100 UNIT/ML SOPN Inject 60 Units into the skin at bedtime. 05/09/13  Yes Vernie Shanks, MD  Insulin Pen Needle 32G X 4 MM MISC Use with Lantus Solostar pen daily 04/09/13  Yes Tammy Eckard, PHARMD  lisinopril (PRINIVIL,ZESTRIL) 2.5 MG tablet Take 1 tablet (2.5 mg total) by mouth daily. 02/06/13  Yes Vernie Shanks, MD  metFORMIN (GLUCOPHAGE) 1000 MG tablet Take 1 tablet (1,000 mg total) by mouth 2 (two) times daily with a meal. 10/23/13  Yes Mary-Margaret Hassell Done, FNP  saxagliptin HCl (ONGLYZA) 5 MG TABS tablet Take 1 tablet (5 mg total) by mouth daily. 04/09/13  Yes Tammy Eckard, PHARMD     ROS: As above in the HPI. All other systems are stable or negative.  OBJECTIVE: APPEARANCE:  Patient in no acute distress.The patient appeared well nourished and normally developed. Acyanotic. Waist: VITAL SIGNS:BP 108/65  Pulse  61  Temp(Src) 97.6 F (36.4 C) (Oral)  Ht 6' (1.829 m)  Wt 189 lb 9.6 oz (86.002 kg)  BMI 25.71 kg/m2 WM  SKIN: warm and  Dry without overt rashes, tattoos and scars  HEAD and Neck: without JVD, Head and scalp: normal Eyes:No scleral icterus. Fundi normal, eye movements normal. Ears: Auricle normal, canal normal, Tympanic membranes normal, insufflation normal. Nose: normal Throat: normal Neck & thyroid: normal  CHEST & LUNGS: Chest wall: normal Lungs: Clear  CVS: Reveals the PMI to be normally located. Regular rhythm, First and Second Heart sounds are normal,  absence of murmurs, rubs or gallops. Peripheral  vasculature: Radial pulses: normal Dorsal pedis pulses: normal Posterior pulses: normal  ABDOMEN:  Appearance: normal Benign, no organomegaly, no masses, no Abdominal Aortic enlargement. No Guarding , no rebound. No Bruits. Bowel sounds: normal  RECTAL: N/A GU: N/A  EXTREMETIES: nonedematous.  MUSCULOSKELETAL:  Spine: normal Joints: intact  NEUROLOGIC: oriented to time,place and person; nonfocal. Cranial Nerves are normal.  ASSESSMENT: Hyperlipidemia - Plan: Lipid panel  HTN (hypertension) - Plan: CMP14+EGFR, lisinopril (PRINIVIL,ZESTRIL) 2.5 MG tablet  History of unsteady gait  AAA (abdominal aortic aneurysm) without rupture  DM (diabetes mellitus) - Plan: POCT glycosylated hemoglobin (Hb A1C), POCT UA - Microalbumin, Insulin Glargine (LANTUS SOLOSTAR) 100 UNIT/ML Solostar Pen, metFORMIN (GLUCOPHAGE) 1000 MG tablet, saxagliptin HCl (ONGLYZA) 5 MG TABS tablet, DISCONTINUED: saxagliptin HCl (ONGLYZA) 5 MG TABS tablet  Vitamin D deficiency - Plan: Vit D  25 hydroxy (rtn osteoporosis monitoring)  Clubbing of fingers  Ex-smoker  PLAN: Dash diet DM foot care       Dr Paula Libra Recommendations  For nutrition information, I recommend books:  1).Eat to Live by Dr Excell Seltzer. 2).Prevent and Reverse Heart Disease by Dr Karl Luke. 3) Dr Janene Harvey Book:  Program to Reverse Diabetes  Exercise recommendations are:  If unable to walk, then the patient can exercise in a chair 3 times a day. By flapping arms like a bird gently and raising legs outwards to the front.  If ambulatory, the patient can go for walks for 30 minutes 3 times a week. Then increase the intensity and duration as tolerated.  Goal is to try to attain exercise frequency to 5 times a week.  If applicable: Best to perform resistance exercises (machines or weights) 2 days a week and cardio type exercises 3 days per week.  Orders Placed This Encounter  Procedures  . CMP14+EGFR  . Vit  D  25 hydroxy (rtn osteoporosis monitoring)  . Lipid panel  . POCT glycosylated hemoglobin (Hb A1C)  . POCT UA - Microalbumin   Meds ordered this encounter  Medications  . Insulin Glargine (LANTUS SOLOSTAR) 100 UNIT/ML Solostar Pen    Sig: Inject 60 Units into the skin at bedtime.    Dispense:  63 mL    Refill:  5  . DISCONTD: saxagliptin HCl (ONGLYZA) 5 MG TABS tablet    Sig: Take 1 tablet (5 mg total) by mouth daily.    Dispense:  90 tablet    Refill:  3  . lisinopril (PRINIVIL,ZESTRIL) 2.5 MG tablet    Sig: Take 1 tablet (2.5 mg total) by mouth daily.    Dispense:  90 tablet    Refill:  3  . metFORMIN (GLUCOPHAGE) 1000 MG tablet    Sig: Take 1 tablet (1,000 mg total) by mouth 2 (two) times daily with a meal.    Dispense:  180 tablet    Refill:  3  . saxagliptin HCl (ONGLYZA) 5 MG TABS tablet    Sig: Take 1 tablet (5 mg total) by mouth daily.    Dispense:  90 tablet    Refill:  3   Medications Discontinued During This Encounter  Medication Reason  . Chromium-Cinnamon 50-500 MCG-MG CAPS Error  . Milk Thistle 1000 MG CAPS Error  . Saw Palmetto, Serenoa repens, ([employer] SAW PALMETTO) 450 MG CAPS Error  . Insulin Glargine (LANTUS SOLOSTAR) 100 UNIT/ML SOPN Reorder  . saxagliptin HCl (ONGLYZA) 5 MG TABS tablet Reorder  . lisinopril (PRINIVIL,ZESTRIL) 2.5 MG tablet Reorder  . metFORMIN (GLUCOPHAGE) 1000 MG tablet Reorder  . saxagliptin HCl (ONGLYZA) 5 MG TABS tablet Reorder   Return in about 4 months (around 03/09/2014) for Recheck medical problems.  Cameo Schmiesing P. Jacelyn Grip, M.D.

## 2013-11-06 NOTE — Patient Instructions (Signed)
DASH Diet The DASH diet stands for "Dietary Approaches to Stop Hypertension." It is a healthy eating plan that has been shown to reduce high blood pressure (hypertension) in as little as 14 days, while also possibly providing other significant health benefits. These other health benefits include reducing the risk of breast cancer after menopause and reducing the risk of type 2 diabetes, heart disease, colon cancer, and stroke. Health benefits also include weight loss and slowing kidney failure in patients with chronic kidney disease.  DIET GUIDELINES  Limit salt (sodium). Your diet should contain less than 1500 mg of sodium daily.  Limit refined or processed carbohydrates. Your diet should include mostly whole grains. Desserts and added sugars should be used sparingly.  Include small amounts of heart-healthy fats. These types of fats include nuts, oils, and tub margarine. Limit saturated and trans fats. These fats have been shown to be harmful in the body. CHOOSING FOODS  The following food groups are based on a 2000 calorie diet. See your Registered Dietitian for individual calorie needs. Grains and Grain Products (6 to 8 servings daily)  Eat More Often: Whole-wheat bread, brown rice, whole-grain or wheat pasta, quinoa, popcorn without added fat or salt (air popped).  Eat Less Often: White bread, white pasta, white rice, cornbread. Vegetables (4 to 5 servings daily)  Eat More Often: Fresh, frozen, and canned vegetables. Vegetables may be raw, steamed, roasted, or grilled with a minimal amount of fat.  Eat Less Often/Avoid: Creamed or fried vegetables. Vegetables in a cheese sauce. Fruit (4 to 5 servings daily)  Eat More Often: All fresh, canned (in natural juice), or frozen fruits. Dried fruits without added sugar. One hundred percent fruit juice ( cup [237 mL] daily).  Eat Less Often: Dried fruits with added sugar. Canned fruit in light or heavy syrup. Lean Meats, Fish, and Poultry (2  servings or less daily. One serving is 3 to 4 oz [85-114 g]).  Eat More Often: Ninety percent or leaner ground beef, tenderloin, sirloin. Round cuts of beef, chicken breast, turkey breast. All fish. Grill, bake, or broil your meat. Nothing should be fried.  Eat Less Often/Avoid: Fatty cuts of meat, turkey, or chicken leg, thigh, or wing. Fried cuts of meat or fish. Dairy (2 to 3 servings)  Eat More Often: Low-fat or fat-free milk, low-fat plain or light yogurt, reduced-fat or part-skim cheese.  Eat Less Often/Avoid: Milk (whole, 2%).Whole milk yogurt. Full-fat cheeses. Nuts, Seeds, and Legumes (4 to 5 servings per week)  Eat More Often: All without added salt.  Eat Less Often/Avoid: Salted nuts and seeds, canned beans with added salt. Fats and Sweets (limited)  Eat More Often: Vegetable oils, tub margarines without trans fats, sugar-free gelatin. Mayonnaise and salad dressings.  Eat Less Often/Avoid: Coconut oils, palm oils, butter, stick margarine, cream, half and half, cookies, candy, pie. FOR MORE INFORMATION The Dash Diet Eating Plan: www.dashdiet.org Document Released: 06/08/2011 Document Revised: 09/11/2011 Document Reviewed: 06/08/2011 Exit[employer] Patient Information 2014 [employer].   Diabetes and Foot Care Diabetes may cause you to have problems because of poor blood supply (circulation) to your feet and legs. This may cause the skin on your feet to become thinner, break easier, and heal more slowly. Your skin may become dry, and the skin may peel and crack. You may also have nerve damage in your legs and feet causing decreased feeling in them. You may not notice minor injuries to your feet that could lead to infections or more serious problems. Taking   care of your feet is one of the most important things you can do for yourself.  HOME CARE INSTRUCTIONS  Wear shoes at all times, even in the house. Do not go barefoot. Bare feet are easily injured.  Check your feet daily for  blisters, cuts, and redness. If you cannot see the bottom of your feet, use a mirror or ask someone for help.  Wash your feet with warm water (do not use hot water) and mild soap. Then pat your feet and the areas between your toes until they are completely dry. Do not soak your feet as this can dry your skin.  Apply a moisturizing lotion or petroleum jelly (that does not contain alcohol and is unscented) to the skin on your feet and to dry, brittle toenails. Do not apply lotion between your toes.  Trim your toenails straight across. Do not dig under them or around the cuticle. File the edges of your nails with an emery board or nail file.  Do not cut corns or calluses or try to remove them with medicine.  Wear clean socks or stockings every day. Make sure they are not too tight. Do not wear knee-high stockings since they may decrease blood flow to your legs.  Wear shoes that fit properly and have enough cushioning. To break in new shoes, wear them for just a few hours a day. This prevents you from injuring your feet. Always look in your shoes before you put them on to be sure there are no objects inside.  Do not cross your legs. This may decrease the blood flow to your feet.  If you find a minor scrape, cut, or break in the skin on your feet, keep it and the skin around it clean and dry. These areas may be cleansed with mild soap and water. Do not cleanse the area with peroxide, alcohol, or iodine.  When you remove an adhesive bandage, be sure not to damage the skin around it.  If you have a wound, look at it several times a day to make sure it is healing.  Do not use heating pads or hot water bottles. They may burn your skin. If you have lost feeling in your feet or legs, you may not know it is happening until it is too late.  Make sure your health care provider performs a complete foot exam at least annually or more often if you have foot problems. Report any cuts, sores, or bruises to your  health care provider immediately. SEEK MEDICAL CARE IF:   You have an injury that is not healing.  You have cuts or breaks in the skin.  You have an ingrown nail.  You notice redness on your legs or feet.  You feel burning or tingling in your legs or feet.  You have pain or cramps in your legs and feet.  Your legs or feet are numb.  Your feet always feel cold. SEEK IMMEDIATE MEDICAL CARE IF:   There is increasing redness, swelling, or pain in or around a wound.  There is a red line that goes up your leg.  Pus is coming from a wound.  You develop a fever or as directed by your health care provider.  You notice a bad smell coming from an ulcer or wound. Document Released: 06/16/2000 Document Revised: 02/19/2013 Document Reviewed: 11/26/2012 [employer] Patient Information 2014 [employer].        Dr Tobie Perdue's Recommendations  For nutrition information, I recommend books:    1).Eat to Live by Dr Joel Fuhrman. 2).Prevent and Reverse Heart Disease by Dr Caldwell Esselstyn. 3) Dr Neal Barnard's Book:  Program to Reverse Diabetes  Exercise recommendations are:  If unable to walk, then the patient can exercise in a chair 3 times a day. By flapping arms like a bird gently and raising legs outwards to the front.  If ambulatory, the patient can go for walks for 30 minutes 3 times a week. Then increase the intensity and duration as tolerated.  Goal is to try to attain exercise frequency to 5 times a week.  If applicable: Best to perform resistance exercises (machines or weights) 2 days a week and cardio type exercises 3 days per week.  

## 2013-11-07 LAB — LIPID PANEL
Chol/HDL Ratio: 3.1 ratio units (ref 0.0–5.0)
Cholesterol, Total: 170 mg/dL (ref 100–199)
HDL: 54 mg/dL (ref >39)
LDL Calculated: 93 mg/dL (ref 0–99)
Triglycerides: 116 mg/dL (ref 0–149)
VLDL Cholesterol Cal: 23 mg/dL (ref 5–40)

## 2013-11-07 LAB — VITAMIN D 25 HYDROXY (VIT D DEFICIENCY, FRACTURES): Vit D, 25-Hydroxy: 24.3 ng/mL — ABNORMAL LOW (ref 30.0–100.0)

## 2013-11-07 LAB — CMP14+EGFR
ALT: 27 IU/L (ref 0–44)
AST: 22 IU/L (ref 0–40)
Albumin/Globulin Ratio: 1.6 (ref 1.1–2.5)
Albumin: 4.5 g/dL (ref 3.6–4.8)
Alkaline Phosphatase: 114 IU/L (ref 39–117)
BUN/Creatinine Ratio: 16 (ref 10–22)
BUN: 16 mg/dL (ref 8–27)
CO2: 22 mmol/L (ref 18–29)
Calcium: 9.4 mg/dL (ref 8.6–10.2)
Chloride: 98 mmol/L (ref 97–108)
Creatinine, Ser: 1.01 mg/dL (ref 0.76–1.27)
GFR calc Af Amer: 90 mL/min/{1.73_m2} (ref >59)
GFR calc non Af Amer: 78 mL/min/{1.73_m2} (ref >59)
Globulin, Total: 2.9 g/dL (ref 1.5–4.5)
Glucose: 111 mg/dL — ABNORMAL HIGH (ref 65–99)
Potassium: 4.4 mmol/L (ref 3.5–5.2)
Sodium: 135 mmol/L (ref 134–144)
Total Bilirubin: 0.5 mg/dL (ref 0.0–1.2)
Total Protein: 7.4 g/dL (ref 6.0–8.5)

## 2013-11-07 NOTE — Progress Notes (Signed)
Quick Note:  Need to see Clinical Pharmacist/Tammy for patient education, medication review and adjustment to achieve goals.HGBA1C is not at goal. The vitamin D is low. Start on Vitamin D 2,000 units daily. Recheck the level in 2 to 3 months. ______

## 2013-11-13 ENCOUNTER — Ambulatory Visit: Payer: Self-pay

## 2013-12-08 ENCOUNTER — Ambulatory Visit (INDEPENDENT_AMBULATORY_CARE_PROVIDER_SITE_OTHER): Payer: Medicare Other | Admitting: Pharmacist

## 2013-12-08 ENCOUNTER — Encounter: Payer: Self-pay | Admitting: Pharmacist

## 2013-12-08 VITALS — BP 110/68 | HR 65 | Ht 72.0 in | Wt 191.0 lb

## 2013-12-08 DIAGNOSIS — E119 Type 2 diabetes mellitus without complications: Secondary | ICD-10-CM

## 2013-12-08 DIAGNOSIS — E559 Vitamin D deficiency, unspecified: Secondary | ICD-10-CM

## 2013-12-08 MED ORDER — VITAMIN D 1000 UNITS PO CAPS: 1000.0000 [IU] | ORAL_CAPSULE | Freq: Every day | ORAL | Status: DC

## 2013-12-08 MED ORDER — INSULIN GLARGINE 100 UNIT/ML SOLOSTAR PEN: PEN_INJECTOR | SUBCUTANEOUS | Status: DC

## 2013-12-08 NOTE — Progress Notes (Signed)
Diabetes Flow Sheet:  Visit 1  Chief Complaint:   Chief Complaint  Patient presents with  . Diabetes     Filed Vitals:   12/08/13 1100  BP: 110/68  Pulse: 65   Filed Weights   12/08/13 1100  Weight: 191 lb (86.637 kg)   HPI:  Most recent  A1C was 7.5% which is much improved from the first time I saw patient about 1 year ago when A1c was 9%.  He has been working hard to limit sugar and high CHO foods.     Current diabetes medcations:  Lantus 60 units Qhs, onzyza 5mg  daily and metformin 1000mg  bid  Exam Edema:  negative  Polyuria:  positive  Polydipsia:  negative Polyphagia:  negative  BMI:  Body mass index is 25.9 kg/(m^2).   Weight changes:  stable General Appearance:  alert, oriented, no acute distress, obese and postive abdominal obesity Mood/Affect:  normal and very talkative.    Low fat/carbohydrate diet?  Yes Nicotine Abuse?  No Medication Compliance?  Yes Exercise?  No Alcohol Abuse?  No  Checks BG 1-2 times a day a varing times.   AM usually 95 - 130;  Lunch - 150 - 200's; supper / night - 150 - 200's Denies any episodes of hypoglycemia  Lab Results  Component Value Date   HGBA1C 7.5% 11/06/2013    Lab Results  Component Value Date   CHOL 182 05/09/2013   HDL 54 11/06/2013   LDLCALC 93 11/06/2013   TRIG 116 11/06/2013   CHOLHDL 3.1 11/06/2013     Medication Checklist: ACE Inhibitor/ARB?  yes Lipid Lowering Agent?  No Aspirin?  Yes Oral Hypoglycemic Agent(s)?  Yes  Assessment: 1.  type 2 Diabetes.  Uncontrolled  2.  Blood Pressure Control.  Normal today 3.  Lipid Control.  At goals 4.  Vitamin D Deficiency  Recommendations: 1.  Reviewed 1800 calorie, carbohydrate counting diet.  2.  30 minutes of physical activity.  Patient is counseled to always carry glucose tablets, lifesavers, hard candies, etc., while exercising in case of hypoglycemic event. 3.  Reveied goals:  Fasting blood glucose goals are 80-130mg /dL.  Post-prandial goals are < 180.  A1C goals <  7.0%. 4.  LDL goal of < 100, HDL > 40 and TG < 150; BP goal < 140/80 5.  Patient instructed to continue to check BG 1 - 2 times daily a varying times and reviewed how to respond to unsuitable results. 6.  Medication recommendations at this time are as follows:    Increase Lantus to 62 units daily and switch from bedtime to morning (may increase every to 2days by 2 units until FBG is 120 or less)  Discussed Afrezza inhaled short acting insulin for patient  Start OTC vitamin d3 1000 IU daily RTC in 2 months to reassess BG control.  Time spent counseling patient:  30 minutes  Referring provider:  Modesto Charon    PharmD:  Henrene Pastor, [facility]

## 2013-12-08 NOTE — Patient Instructions (Signed)
Change Lantus to 62 units in the morning -  To do this take 20 units tonight (12/08/13) and then 42 units tomorrow morning (12/09/13).  Thereafter take 62 units every morning.

## 2013-12-15 ENCOUNTER — Telehealth: Payer: Self-pay | Admitting: Family Medicine

## 2013-12-16 ENCOUNTER — Ambulatory Visit (INDEPENDENT_AMBULATORY_CARE_PROVIDER_SITE_OTHER): Payer: Medicare Other | Admitting: Family

## 2013-12-16 ENCOUNTER — Encounter: Payer: Self-pay | Admitting: Family

## 2013-12-16 VITALS — BP 112/61 | HR 60 | Temp 97.6°F | Ht 72.0 in | Wt 186.6 lb

## 2013-12-16 DIAGNOSIS — S90569A Insect bite (nonvenomous), unspecified ankle, initial encounter: Secondary | ICD-10-CM | POA: Diagnosis not present

## 2013-12-16 DIAGNOSIS — S80862A Insect bite (nonvenomous), left lower leg, initial encounter: Secondary | ICD-10-CM

## 2013-12-16 DIAGNOSIS — W57XXXA Bitten or stung by nonvenomous insect and other nonvenomous arthropods, initial encounter: Secondary | ICD-10-CM

## 2013-12-16 MED ORDER — TRIAMCINOLONE ACETONIDE 0.025 % EX OINT: 1.0000 "application " | TOPICAL_OINTMENT | Freq: Two times a day (BID) | CUTANEOUS | Status: DC

## 2013-12-16 NOTE — Patient Instructions (Signed)
Insect Bite  Mosquitoes, flies, fleas, bedbugs, and many other insects can bite. Insect bites are different from insect stings. A sting is when venom is injected into the skin. Some insect bites can transmit infectious diseases.  SYMPTOMS   Insect bites usually turn red, swell, and itch for 2 to 4 days. They often go away on their own.  TREATMENT   Your caregiver may prescribe antibiotic medicines if a bacterial infection develops in the bite.  HOME CARE INSTRUCTIONS   Do not scratch the bite area.   Keep the bite area clean and dry. Wash the bite area thoroughly with soap and water.   Put ice or cool compresses on the bite area.   Put ice in a plastic bag.   Place a towel between your skin and the bag.   Leave the ice on for 20 minutes, 4 times a day for the first 2 to 3 days, or as directed.   You may apply a baking soda paste, cortisone cream, or calamine lotion to the bite area as directed by your caregiver. This can help reduce itching and swelling.   Only take over-the-counter or prescription medicines as directed by your caregiver.   If you are given antibiotics, take them as directed. Finish them even if you start to feel better.  You may need a tetanus shot if:   You cannot remember when you had your last tetanus shot.   You have never had a tetanus shot.   The injury broke your skin.  If you get a tetanus shot, your arm may swell, get red, and feel warm to the touch. This is common and not a problem. If you need a tetanus shot and you choose not to have one, there is a rare chance of getting tetanus. Sickness from tetanus can be serious.  SEEK IMMEDIATE MEDICAL CARE IF:    You have increased pain, redness, or swelling in the bite area.   You see a red line on the skin coming from the bite.   You have a fever.   You have joint pain.   You have a headache or neck pain.   You have unusual weakness.   You have a rash.   You have chest pain or shortness of breath.    You have abdominal pain, nausea, or vomiting.   You feel unusually tired or sleepy.  MAKE SURE YOU:    Understand these instructions.   Will watch your condition.   Will get help right away if you are not doing well or get worse.  Document Released: 07/27/2004 Document Revised: 09/11/2011 Document Reviewed: 01/18/2011  [employer] Patient Information 2014 [employer].

## 2013-12-16 NOTE — Progress Notes (Signed)
   Subjective:    Patient ID: Jon [name], male    DOB: 26-Apr-1946, 68 y.o.   MRN: [id number]  HPI Pt presents to office with several insect bites on left lower leg that occurred over a month ago, but that are still itching and have not healed. PT has tried some "anti-itch cream" and hot baths with no relief. Pt states some of them have "healed" that were on his right leg and upper left leg.    Review of Systems  HENT: Negative.   Respiratory: Negative.   Cardiovascular: Negative.   Gastrointestinal: Positive for abdominal distention.  Genitourinary: Negative.   Musculoskeletal: Negative.   Hematological: Negative.   Psychiatric/Behavioral: Negative.   All other systems reviewed and are negative.      Objective:   Physical Exam  Vitals reviewed. Constitutional: He is oriented to person, place, and time. He appears well-developed and well-nourished. No distress.  Cardiovascular: Normal rate, regular rhythm, normal heart sounds and intact distal pulses.   No murmur heard. Pulmonary/Chest: Effort normal and breath sounds normal. No respiratory distress. He has no wheezes.  Abdominal: Soft. Bowel sounds are normal. He exhibits no distension. There is no tenderness.  Musculoskeletal: Normal range of motion. He exhibits no edema and no tenderness.  Neurological: He is alert and oriented to person, place, and time.  Skin: Skin is warm and dry. No rash noted. There is erythema.  Scattered circular erythemas on lower left leg  Psychiatric: He has a normal mood and affect. His behavior is normal. Judgment and thought content normal.    BP 112/61  Pulse 60  Temp(Src) 97.6 F (36.4 C) (Oral)  Ht 6' (1.829 m)  Wt 186 lb 9.6 oz (84.641 kg)  BMI 25.30 kg/m2       Assessment & Plan:  1. Insect bite of leg, left -Do not scratch area -Keep clean and dry -Monitor of s/s of infection - triamcinolone (KENALOG) 0.025 % ointment; Apply 1 application topically 2 (two) times daily.   Dispense: 30 g; Refill: 1 -RTO prn  Jannifer Rodneyhristy Hawks, FNP

## 2013-12-23 DIAGNOSIS — G3184 Mild cognitive impairment, so stated: Secondary | ICD-10-CM | POA: Diagnosis not present

## 2013-12-23 DIAGNOSIS — R488 Other symbolic dysfunctions: Secondary | ICD-10-CM | POA: Diagnosis not present

## 2013-12-30 ENCOUNTER — Ambulatory Visit: Payer: Medicare Other | Attending: Neurology | Admitting: Physical Therapy

## 2013-12-30 DIAGNOSIS — IMO0001 Reserved for inherently not codable concepts without codable children: Secondary | ICD-10-CM | POA: Diagnosis not present

## 2013-12-30 DIAGNOSIS — R5381 Other malaise: Secondary | ICD-10-CM | POA: Insufficient documentation

## 2013-12-30 DIAGNOSIS — E119 Type 2 diabetes mellitus without complications: Secondary | ICD-10-CM | POA: Insufficient documentation

## 2013-12-30 DIAGNOSIS — R269 Unspecified abnormalities of gait and mobility: Secondary | ICD-10-CM | POA: Diagnosis not present

## 2014-01-01 ENCOUNTER — Encounter: Admitting: Physical Therapy

## 2014-01-06 ENCOUNTER — Ambulatory Visit: Payer: Medicare Other | Attending: Neurology | Admitting: Physical Therapy

## 2014-01-06 DIAGNOSIS — E119 Type 2 diabetes mellitus without complications: Secondary | ICD-10-CM | POA: Insufficient documentation

## 2014-01-06 DIAGNOSIS — IMO0001 Reserved for inherently not codable concepts without codable children: Secondary | ICD-10-CM | POA: Insufficient documentation

## 2014-01-06 DIAGNOSIS — R5381 Other malaise: Secondary | ICD-10-CM | POA: Insufficient documentation

## 2014-01-06 DIAGNOSIS — R269 Unspecified abnormalities of gait and mobility: Secondary | ICD-10-CM | POA: Insufficient documentation

## 2014-01-08 ENCOUNTER — Ambulatory Visit: Payer: Medicare Other | Admitting: Physical Therapy

## 2014-01-08 DIAGNOSIS — R5381 Other malaise: Secondary | ICD-10-CM | POA: Diagnosis not present

## 2014-01-08 DIAGNOSIS — IMO0001 Reserved for inherently not codable concepts without codable children: Secondary | ICD-10-CM | POA: Diagnosis not present

## 2014-01-08 DIAGNOSIS — R269 Unspecified abnormalities of gait and mobility: Secondary | ICD-10-CM | POA: Diagnosis not present

## 2014-01-08 DIAGNOSIS — E119 Type 2 diabetes mellitus without complications: Secondary | ICD-10-CM | POA: Diagnosis not present

## 2014-01-13 ENCOUNTER — Ambulatory Visit: Payer: Medicare Other | Admitting: Physical Therapy

## 2014-01-13 DIAGNOSIS — IMO0001 Reserved for inherently not codable concepts without codable children: Secondary | ICD-10-CM | POA: Diagnosis not present

## 2014-01-13 DIAGNOSIS — R269 Unspecified abnormalities of gait and mobility: Secondary | ICD-10-CM | POA: Diagnosis not present

## 2014-01-13 DIAGNOSIS — R5381 Other malaise: Secondary | ICD-10-CM | POA: Diagnosis not present

## 2014-01-13 DIAGNOSIS — E119 Type 2 diabetes mellitus without complications: Secondary | ICD-10-CM | POA: Diagnosis not present

## 2014-01-15 ENCOUNTER — Ambulatory Visit: Payer: Medicare Other | Admitting: Physical Therapy

## 2014-01-15 DIAGNOSIS — R5381 Other malaise: Secondary | ICD-10-CM | POA: Diagnosis not present

## 2014-01-15 DIAGNOSIS — IMO0001 Reserved for inherently not codable concepts without codable children: Secondary | ICD-10-CM | POA: Diagnosis not present

## 2014-01-15 DIAGNOSIS — E119 Type 2 diabetes mellitus without complications: Secondary | ICD-10-CM | POA: Diagnosis not present

## 2014-01-15 DIAGNOSIS — R269 Unspecified abnormalities of gait and mobility: Secondary | ICD-10-CM | POA: Diagnosis not present

## 2014-01-20 ENCOUNTER — Ambulatory Visit: Payer: Medicare Other | Admitting: *Deleted

## 2014-01-20 DIAGNOSIS — R269 Unspecified abnormalities of gait and mobility: Secondary | ICD-10-CM | POA: Diagnosis not present

## 2014-01-20 DIAGNOSIS — R5381 Other malaise: Secondary | ICD-10-CM | POA: Diagnosis not present

## 2014-01-20 DIAGNOSIS — E119 Type 2 diabetes mellitus without complications: Secondary | ICD-10-CM | POA: Diagnosis not present

## 2014-01-20 DIAGNOSIS — IMO0001 Reserved for inherently not codable concepts without codable children: Secondary | ICD-10-CM | POA: Diagnosis not present

## 2014-01-21 DIAGNOSIS — E119 Type 2 diabetes mellitus without complications: Secondary | ICD-10-CM | POA: Diagnosis not present

## 2014-01-21 DIAGNOSIS — G44209 Tension-type headache, unspecified, not intractable: Secondary | ICD-10-CM | POA: Diagnosis not present

## 2014-01-21 LAB — HM DIABETES EYE EXAM

## 2014-01-22 ENCOUNTER — Ambulatory Visit: Payer: Medicare Other | Admitting: *Deleted

## 2014-01-22 DIAGNOSIS — IMO0001 Reserved for inherently not codable concepts without codable children: Secondary | ICD-10-CM | POA: Diagnosis not present

## 2014-01-22 DIAGNOSIS — R5381 Other malaise: Secondary | ICD-10-CM | POA: Diagnosis not present

## 2014-01-22 DIAGNOSIS — R269 Unspecified abnormalities of gait and mobility: Secondary | ICD-10-CM | POA: Diagnosis not present

## 2014-01-22 DIAGNOSIS — E119 Type 2 diabetes mellitus without complications: Secondary | ICD-10-CM | POA: Diagnosis not present

## 2014-01-27 ENCOUNTER — Ambulatory Visit: Payer: Medicare Other | Admitting: Physical Therapy

## 2014-01-27 DIAGNOSIS — IMO0001 Reserved for inherently not codable concepts without codable children: Secondary | ICD-10-CM | POA: Diagnosis not present

## 2014-01-27 DIAGNOSIS — R269 Unspecified abnormalities of gait and mobility: Secondary | ICD-10-CM | POA: Diagnosis not present

## 2014-01-27 DIAGNOSIS — E119 Type 2 diabetes mellitus without complications: Secondary | ICD-10-CM | POA: Diagnosis not present

## 2014-01-27 DIAGNOSIS — R5381 Other malaise: Secondary | ICD-10-CM | POA: Diagnosis not present

## 2014-01-29 ENCOUNTER — Ambulatory Visit: Payer: Medicare Other | Admitting: *Deleted

## 2014-02-02 ENCOUNTER — Encounter: Payer: Self-pay | Admitting: Family Medicine

## 2014-02-02 ENCOUNTER — Ambulatory Visit (INDEPENDENT_AMBULATORY_CARE_PROVIDER_SITE_OTHER): Payer: Medicare Other | Admitting: Family Medicine

## 2014-02-02 VITALS — BP 104/66 | HR 103 | Temp 98.3°F | Ht 72.0 in | Wt 187.6 lb

## 2014-02-02 DIAGNOSIS — L259 Unspecified contact dermatitis, unspecified cause: Secondary | ICD-10-CM | POA: Diagnosis not present

## 2014-02-02 MED ORDER — HYDROXYZINE HCL 25 MG PO TABS: 25.0000 mg | ORAL_TABLET | Freq: Three times a day (TID) | ORAL | Status: DC | PRN

## 2014-02-02 MED ORDER — METHYLPREDNISOLONE ACETATE 80 MG/ML IJ SUSP
80.0000 mg | Freq: Once | INTRAMUSCULAR | Status: AC
  Administered 2014-02-02: 80 mg via INTRAMUSCULAR

## 2014-02-02 MED ORDER — METHYLPREDNISOLONE (PAK) 4 MG PO TABS: ORAL_TABLET | ORAL | Status: DC

## 2014-02-02 NOTE — Progress Notes (Signed)
   Subjective:    Patient ID: Annabell Howellsony L Thatch, male    DOB: 06/08/1946, 68 y.o.   MRN: [id number]  HPI This 68 y.o. male presents for evaluation of rash on lower extremities.   Review of Systems    No chest pain, SOB, HA, dizziness, vision change, N/V, diarrhea, constipation, dysuria, urinary urgency or frequency, myalgias, arthralgias or rash.  Objective:   Physical Exam  Vital signs noted  Well developed well nourished male.  HEENT - Head atraumatic Normocephalic                Eyes - PERRLA, Conjuctiva - clear Sclera- Clear EOMI                Ears - EAC's Wnl TM's Wnl Gross Hearing WNL\                Throat - oropharanx wnl Respiratory - Lungs CTA bilateral Cardiac - RRR S1 and S2 without murmur GI - Abdomen soft Nontender and bowel sounds active x 4 Extremities - No edema. Neuro - Grossly intact. Skin - Erythematous macular papular rash on lower extremities     Assessment & Plan:  Contact dermatitis - Depomedrol 80mg  po qd Medrol Dose pack as directed Vistaril 25mg  po qd qid prn #30  Deatra CanterWilliam J Marijo Quizon FNP

## 2014-02-03 ENCOUNTER — Ambulatory Visit: Payer: Medicare Other | Attending: Neurology | Admitting: Physical Therapy

## 2014-02-03 DIAGNOSIS — R269 Unspecified abnormalities of gait and mobility: Secondary | ICD-10-CM | POA: Diagnosis not present

## 2014-02-03 DIAGNOSIS — IMO0001 Reserved for inherently not codable concepts without codable children: Secondary | ICD-10-CM | POA: Diagnosis not present

## 2014-02-03 DIAGNOSIS — R5381 Other malaise: Secondary | ICD-10-CM | POA: Insufficient documentation

## 2014-02-03 DIAGNOSIS — E119 Type 2 diabetes mellitus without complications: Secondary | ICD-10-CM | POA: Diagnosis not present

## 2014-02-05 ENCOUNTER — Ambulatory Visit: Payer: Medicare Other | Admitting: *Deleted

## 2014-02-05 DIAGNOSIS — IMO0001 Reserved for inherently not codable concepts without codable children: Secondary | ICD-10-CM | POA: Diagnosis not present

## 2014-02-06 ENCOUNTER — Ambulatory Visit (INDEPENDENT_AMBULATORY_CARE_PROVIDER_SITE_OTHER): Payer: Medicare Other | Admitting: Pharmacist

## 2014-02-06 ENCOUNTER — Encounter: Payer: Self-pay | Admitting: Pharmacist

## 2014-02-06 ENCOUNTER — Telehealth: Payer: Self-pay | Admitting: Pharmacist

## 2014-02-06 VITALS — BP 134/72 | HR 77 | Ht 70.0 in | Wt 186.0 lb

## 2014-02-06 DIAGNOSIS — E119 Type 2 diabetes mellitus without complications: Secondary | ICD-10-CM

## 2014-02-06 DIAGNOSIS — E785 Hyperlipidemia, unspecified: Secondary | ICD-10-CM

## 2014-02-06 DIAGNOSIS — E559 Vitamin D deficiency, unspecified: Secondary | ICD-10-CM

## 2014-02-06 DIAGNOSIS — Z0000 Encounter for general adult medical examination without abnormal findings: Secondary | ICD-10-CM | POA: Diagnosis not present

## 2014-02-06 DIAGNOSIS — E1165 Type 2 diabetes mellitus with hyperglycemia: Secondary | ICD-10-CM

## 2014-02-06 LAB — POCT GLYCOSYLATED HEMOGLOBIN (HGB A1C): HEMOGLOBIN A1C: 8

## 2014-02-06 NOTE — Progress Notes (Addendum)
Subjective:    Jon [name] is a 68 y.o. male who presents for Medicare Initial Wellness Visit and Recheck type 2 DM   I saw patinet about 6 weeks ago for diabetes education and insulin adjustment.  He is currenlty taking Lantus 62 units daily, metformin 1041m bid and onglyza 563mqd.   HBG redings range from 130 to 175.   Preventive Screening-Counseling & Management  Tobacco History  Smoking status  . Former Smoker  . Quit date: 02/06/2009  Smokeless tobacco  . Never Used    Current Problems (verified) Patient Active Problem List   Diagnosis Date Noted  . Mild cognitive disorder 05/15/2013  . Vitamin D deficiency   . Hyperlipidemia 03/19/2013  . DM (diabetes mellitus) 02/06/2013  . HTN (hypertension) 02/06/2013  . AAA (abdominal aortic aneurysm) without rupture 02/06/2013  . Clubbing of fingers 02/06/2013  . Ex-smoker 02/06/2013  . History of unsteady gait 02/06/2013    Medications Prior to Visit Current Outpatient Prescriptions on File Prior to Visit  Medication Sig Dispense Refill  . aspirin 81 MG tablet Take 81 mg by mouth daily.      . Cholecalciferol (VITAMIN D) 1000 UNITS capsule Take 1 capsule (1,000 Units total) by mouth daily.      . Marland Kitchenlucose blood (ACCU-CHEK SMARTVIEW) test strip Use to check BG twice a day.  DX:  250.02 uncontrolled type 2 diabetes.  200 each  2  . hydrOXYzine (ATARAX/VISTARIL) 25 MG tablet Take 1 tablet (25 mg total) by mouth 3 (three) times daily as needed.  30 tablet  0  . Insulin Pen Needle 32G X 4 MM MISC Use with Lantus Solostar pen daily  100 each  2  . lisinopril (PRINIVIL,ZESTRIL) 2.5 MG tablet Take 1 tablet (2.5 mg total) by mouth daily.  90 tablet  3  . metFORMIN (GLUCOPHAGE) 1000 MG tablet Take 1 tablet (1,000 mg total) by mouth 2 (two) times daily with a meal.  180 tablet  3  . methylPREDNIsolone (MEDROL DOSPACK) 4 MG tablet follow package directions  21 tablet  0  . saxagliptin HCl (ONGLYZA) 5 MG TABS tablet Take 1 tablet (5 mg  total) by mouth daily.  90 tablet  3  . triamcinolone (KENALOG) 0.025 % ointment Apply 1 application topically 2 (two) times daily.  30 g  1   No current facility-administered medications on file prior to visit.    Current Medications (verified) Current Outpatient Prescriptions  Medication Sig Dispense Refill  . aspirin 81 MG tablet Take 81 mg by mouth daily.      . Cholecalciferol (VITAMIN D) 1000 UNITS capsule Take 1 capsule (1,000 Units total) by mouth daily.      . Marland Kitchenlucose blood (ACCU-CHEK SMARTVIEW) test strip Use to check BG twice a day.  DX:  250.02 uncontrolled type 2 diabetes.  200 each  2  . hydrOXYzine (ATARAX/VISTARIL) 25 MG tablet Take 1 tablet (25 mg total) by mouth 3 (three) times daily as needed.  30 tablet  0  . Insulin Glargine (LANTUS SOLOSTAR) 100 UNIT/ML Solostar Pen Inject up to 70 units daily as directed  60 mL  1  . Insulin Pen Needle 32G X 4 MM MISC Use with Lantus Solostar pen daily  100 each  2  . lisinopril (PRINIVIL,ZESTRIL) 2.5 MG tablet Take 1 tablet (2.5 mg total) by mouth daily.  90 tablet  3  . metFORMIN (GLUCOPHAGE) 1000 MG tablet Take 1 tablet (1,000 mg total) by mouth 2 (two) times daily  with a meal.  180 tablet  3  . methylPREDNIsolone (MEDROL DOSPACK) 4 MG tablet follow package directions  21 tablet  0  . saxagliptin HCl (ONGLYZA) 5 MG TABS tablet Take 1 tablet (5 mg total) by mouth daily.  90 tablet  3  . triamcinolone (KENALOG) 0.025 % ointment Apply 1 application topically 2 (two) times daily.  30 g  1   No current facility-administered medications for this visit.     Allergies (verified) Review of patient's allergies indicates no known allergies.   PAST HISTORY  Family History Family History  Problem Relation Age of Onset  . Diabetes Mother   . Hip fracture Mother   . Pulmonary embolism Mother     after hip fracture  . Lupus Sister   . Scleroderma Sister   . Diabetes Brother   . Hyperlipidemia Brother   . Hypertension Brother      Social History History  Substance Use Topics  . Smoking status: Former Smoker    Quit date: 02/06/2009  . Smokeless tobacco: Never Used  . Alcohol Use: No    Are there smokers in your home (other than you)?  Yes  Risk Factors Current exercise habits: Gym/ health club routine includes Physical Therapy 2 times per week.  Dietary issues discussed: limiting CHO intake  Cardiac risk factors: advanced age (older than 79 for men, 71 for women), diabetes mellitus, dyslipidemia, hypertension, male gender and microalbuminuria.  Depression Screen (Note: if answer to either of the following is "Yes", a more complete depression screening is indicated)   Q1: Over the past two weeks, have you felt down, depressed or hopeless? No  Q2: Over the past two weeks, have you felt little interest or pleasure in doing things? No  Have you lost interest or pleasure in daily life? No  Do you often feel hopeless? No  Do you cry easily over simple problems? No  Activities of Daily Living In your present state of health, do you have any difficulty performing the following activities?:  Driving? No Managing money?  No Feeding yourself? No Getting from bed to chair? No Climbing a flight of stairs? No Preparing food and eating?: No Bathing or showering? No Getting dressed: No Getting to the toilet? No Using the toilet:No Moving around from place to place: No In the past year have you fallen or had a near fall?:No   Are you sexually active?  Yes  Do you have more than one partner?  No  Hearing Difficulties: No - but c/o ear need to be washed out Do you often ask people to speak up or repeat themselves? No Do you experience ringing or noises in your ears? No Do you have difficulty understanding soft or whispered voices? No   Do you feel that you have a problem with memory? Per patient - Sometimes  Do you often misplace items? No  Do you feel safe at home?  Yes  Cognitive Testing  Alert? Yes   Normal Appearance?Yes  Oriented to person? Yes    Place? Yes   Time? Yes  Recall of three objects?  Remembered 2 of 3  Can perform simple calculations? Yes  Displays appropriate judgment?Yes  Can read the correct time from a watch face?Yes   Advanced Directives have been discussed with the patient? Yes   List the Names of Other Physician/Practitioners you currently use: 1.  Eye = Dr Truman Hayward / [facility] 2.  Vascular Surgeon / checks AAA yearly - [facility] 3.  Neurologist - [facility]   Indicate any recent Medical Services you may have received from other than [facility] providers in the past year (date may be approximate).   There is no immunization history on file for this patient.  Screening Tests Health Maintenance  Topic Date Due  . Tetanus/tdap  12/20/1966  . Colonoscopy  12/19/1997  . Zostavax  12/20/2007  . Pneumococcal Polysaccharide Vaccine Age 75 And Over  12/19/2012  . Influenza Vaccine  01/31/2014  . Foot Exam  05/09/2014  . Hemoglobin A1c  05/09/2014  . Urine Microalbumin  05/09/2014  . Ophthalmology Exam  01/22/2015    All answers were reviewed with the patient and necessary referrals were made:  Cherre Robins, [facility]   02/06/2014   History reviewed: allergies, current medications, past family history, past medical history, past social history, past surgical history and problem list   Objective:   Blood pressure 134/72, pulse 77, height _0  (1.778 m), weight 186 lb (84.369 kg). Body mass index is 26.69 kg/(m^2).   Assessment:   Medicare Wellness Visit - Initial Type 2 DM - not at goals     Plan:     During the course of the visit the patient was educated and counseled about appropriate screening and preventive services including:    Pneumococcal vaccine - patient declined  Influenza vaccine  Hepatitis B vaccine  Td vaccine - patient declined  Screening electrocardiogram  Prostate cancer screening  Colorectal cancer screening -  discussed in depth / patient declined  Glaucoma screening  Nutrition counseling   Advanced Directives Packet GIven  Increase Lantus to 70 units daily  Rx for new glucometer and test strips given - May use whichever glucometer is covered by his insurance. Patient has a letter which mentions which glucometers are covered at home 0 he will take along with prescriptions to pharmacy. Orders Placed This Encounter  Procedures  . CMP14+EGFR  . Lipid panel  . Vit D  25 hydroxy (rtn osteoporosis monitoring)  . POCT glycosylated hemoglobin (Hb A1C)     Diet review for nutrition referral? Yes ____  Not Indicated ____   Patient Instructions (the written plan) was given to the patient.  Medicare Attestation I have personally reviewed: The patient's medical and social history Their use of alcohol, tobacco or illicit drugs Their current medications and supplements The patient's functional ability including ADLs,fall risks, home safety risks, cognitive, and hearing and visual impairment Diet and physical activities Evidence for depression or mood disorders  The patient's weight, height, BMI and BP/HR have been recorded in the chart.  I have made referrals, counseling, and provided education to the patient based on review of the above and I have provided the patient with a written personalized care plan for preventive services.     Cherre Robins, [facility]   02/06/2014

## 2014-02-06 NOTE — Telephone Encounter (Signed)
A1c has increased - increase lantus to 70 units daily  rtc in 1 month

## 2014-02-06 NOTE — Patient Instructions (Addendum)
Health Maintenance Summary    TETANUS/TDAP Overdue 12/20/1966      COLONOSCOPY Overdue 12/19/1997      ZOSTAVAX Overdue 12/20/2007      PNEUMOCOCCAL POLYSACCHARIDE VACCINE AGE 66 AND OVER Overdue 12/19/2012      INFLUENZA VACCINE Next Due 01/31/2014      FOOT EXAM Next Due 05/09/2014      HEMOGLOBIN A1C Next Due 05/09/2014  Last was 7.5% 11/06/2013    URINE MICROALBUMIN Next Due 05/09/2014      OPHTHALMOLOGY EXAM Next Due 01/22/2015             Preventive Care for Adults A healthy lifestyle and preventive care can promote health and wellness. Preventive health guidelines for men include the following key practices:  A routine yearly physical is a good way to check with your health care provider about your health and preventative screening. It is a chance to share any concerns and updates on your health and to receive a thorough exam.  Visit your dentist for a routine exam and preventative care every 6 months. Brush your teeth twice a day and floss once a day. Good oral hygiene prevents tooth decay and gum disease.  The frequency of eye exams is based on your age, health, family medical history, use of contact lenses, and other factors. Follow your health care provider's recommendations for frequency of eye exams.  Eat a healthy diet. Foods such as vegetables, fruits, whole grains, low-fat dairy products, and lean protein foods contain the nutrients you need without too many calories. Decrease your intake of foods high in solid fats, added sugars, and salt. Eat the right amount of calories for you.Get information about a proper diet from your health care provider, if necessary.  Regular physical exercise is one of the most important things you can do for your health. Most adults should get at least 150 minutes of moderate-intensity exercise (any activity that increases your heart rate and causes you to sweat) each week. In addition, most adults need muscle-strengthening exercises on 2 or more days a  week.  Maintain a healthy weight. The body mass index (BMI) is a screening tool to identify possible weight problems. It provides an estimate of body fat based on height and weight. Your health care provider can find your BMI and can help you achieve or maintain a healthy weight.For adults 20 years and older:  A BMI below 18.5 is considered underweight.  A BMI of 18.5 to 24.9 is normal.  A BMI of 25 to 29.9 is considered overweight.  A BMI of 30 and above is considered obese.  Maintain normal blood lipids and cholesterol levels by exercising and minimizing your intake of saturated fat. Eat a balanced diet with plenty of fruit and vegetables. Blood tests for lipids and cholesterol should begin at age 57 and be repeated every 5 years. If your lipid or cholesterol levels are high, you are over 50, or you are at high risk for heart disease, you may need your cholesterol levels checked more frequently.Ongoing high lipid and cholesterol levels should be treated with medicines if diet and exercise are not working.  If you smoke, find out from your health care provider how to quit. If you do not use tobacco, do not start.  Lung cancer screening is recommended for adults aged 44-80 years who are at high risk for developing lung cancer because of a history of smoking. A yearly low-dose CT scan of the lungs is recommended for people who have  at least a 30-pack-year history of smoking and are a current smoker or have quit within the past 15 years. A pack year of smoking is smoking an average of 1 pack of cigarettes a day for 1 year (for example: 1 pack a day for 30 years or 2 packs a day for 15 years). Yearly screening should continue until the smoker has stopped smoking for at least 15 years. Yearly screening should be stopped for people who develop a health problem that would prevent them from having lung cancer treatment.  If you choose to drink alcohol, do not have more than 2 drinks per day. One drink  is considered to be 12 ounces (355 mL) of beer, 5 ounces (148 mL) of wine, or 1.5 ounces (44 mL) of liquor.  Avoid use of street drugs. Do not share needles with anyone. Ask for help if you need support or instructions about stopping the use of drugs.  High blood pressure causes heart disease and increases the risk of stroke. Your blood pressure should be checked at least every 1-2 years. Ongoing high blood pressure should be treated with medicines, if weight loss and exercise are not effective.  If you are 88-65 years old, ask your health care provider if you should take aspirin to prevent heart disease.  Diabetes screening involves taking a blood sample to check your fasting blood sugar level. This should be done once every 3 years, after age 13, if you are within normal weight and without risk factors for diabetes. Testing should be considered at a younger age or be carried out more frequently if you are overweight and have at least 1 risk factor for diabetes.  Colorectal cancer can be detected and often prevented. Most routine colorectal cancer screening begins at the age of 77 and continues through age 5. However, your health care provider may recommend screening at an earlier age if you have risk factors for colon cancer. On a yearly basis, your health care provider may provide home test kits to check for hidden blood in the stool. Use of a small camera at the end of a tube to directly examine the colon (sigmoidoscopy or colonoscopy) can detect the earliest forms of colorectal cancer. Talk to your health care provider about this at age 51, when routine screening begins. Direct exam of the colon should be repeated every 5-10 years through age 47, unless early forms of precancerous polyps or small growths are found.  People who are at an increased risk for hepatitis B should be screened for this virus. You are considered at high risk for hepatitis B if:  You were born in a country where hepatitis  B occurs often. Talk with your health care provider about which countries are considered high risk.  Your parents were born in a high-risk country and you have not received a shot to protect against hepatitis B (hepatitis B vaccine).  You have HIV or AIDS.  You use needles to inject street drugs.  You live with, or have sex with, someone who has hepatitis B.  You are a man who has sex with other men (MSM).  You get hemodialysis treatment.  You take certain medicines for conditions such as cancer, organ transplantation, and autoimmune conditions.  Hepatitis C blood testing is recommended for all people born from [birth year] through 1965 and any individual with known risks for hepatitis C.  Practice safe sex. Use condoms and avoid high-risk sexual practices to reduce the spread of sexually transmitted infections (  STIs). STIs include gonorrhea, chlamydia, syphilis, trichomonas, herpes, HPV, and human immunodeficiency virus (HIV). Herpes, HIV, and HPV are viral illnesses that have no cure. They can result in disability, cancer, and death.  If you are at risk of being infected with HIV, it is recommended that you take a prescription medicine daily to prevent HIV infection. This is called preexposure prophylaxis (PrEP). You are considered at risk if:  You are a man who has sex with other men (MSM) and have other risk factors.  You are a heterosexual man, are sexually active, and are at increased risk for HIV infection.  You take drugs by injection.  You are sexually active with a partner who has HIV.  Talk with your health care provider about whether you are at high risk of being infected with HIV. If you choose to begin PrEP, you should first be tested for HIV. You should then be tested every 3 months for as long as you are taking PrEP.  A one-time screening for abdominal aortic aneurysm (AAA) and surgical repair of large AAAs by ultrasound are recommended for men ages 95 to 65 years who are  current or former smokers.  Healthy men should no longer receive prostate-specific antigen (PSA) blood tests as part of routine cancer screening. Talk with your health care provider about prostate cancer screening.  Testicular cancer screening is not recommended for adult males who have no symptoms. Screening includes self-exam, a health care provider exam, and other screening tests. Consult with your health care provider about any symptoms you have or any concerns you have about testicular cancer.  Use sunscreen. Apply sunscreen liberally and repeatedly throughout the day. You should seek shade when your shadow is shorter than you. Protect yourself by wearing long sleeves, pants, a wide-brimmed hat, and sunglasses year round, whenever you are outdoors.  Once a month, do a whole-body skin exam, using a mirror to look at the skin on your back. Tell your health care provider about new moles, moles that have irregular borders, moles that are larger than a pencil eraser, or moles that have changed in shape or color.  Stay current with required vaccines (immunizations).  Influenza vaccine. All adults should be immunized every year.  Tetanus, diphtheria, and acellular pertussis (Td, Tdap) vaccine. An adult who has not previously received Tdap or who does not know his vaccine status should receive 1 dose of Tdap. This initial dose should be followed by tetanus and diphtheria toxoids (Td) booster doses every 10 years. Adults with an unknown or incomplete history of completing a 3-dose immunization series with Td-containing vaccines should begin or complete a primary immunization series including a Tdap dose. Adults should receive a Td booster every 10 years.  Varicella vaccine. An adult without evidence of immunity to varicella should receive 2 doses or a second dose if he has previously received 1 dose.  Human papillomavirus (HPV) vaccine. Males aged 67-21 years who have not received the vaccine  previously should receive the 3-dose series. Males aged 22-26 years may be immunized. Immunization is recommended through the age of 61 years for any male who has sex with males and did not get any or all doses earlier. Immunization is recommended for any person with an immunocompromised condition through the age of 65 years if he did not get any or all doses earlier. During the 3-dose series, the second dose should be obtained 4-8 weeks after the first dose. The third dose should be obtained 24 weeks after the  first dose and 16 weeks after the second dose.  Zoster vaccine. One dose is recommended for adults aged 57 years or older unless certain conditions are present.  Measles, mumps, and rubella (MMR) vaccine. Adults born before [birth year] generally are considered immune to measles and mumps. Adults born in [birth year] or later should have 1 or more doses of MMR vaccine unless there is a contraindication to the vaccine or there is laboratory evidence of immunity to each of the three diseases. A routine second dose of MMR vaccine should be obtained at least 28 days after the first dose for students attending postsecondary schools, health care workers, or international travelers. People who received inactivated measles vaccine or an unknown type of measles vaccine during 1963-1967 should receive 2 doses of MMR vaccine. People who received inactivated mumps vaccine or an unknown type of mumps vaccine before 1979 and are at high risk for mumps infection should consider immunization with 2 doses of MMR vaccine. Unvaccinated health care workers born before [birth year] who lack laboratory evidence of measles, mumps, or rubella immunity or laboratory confirmation of disease should consider measles and mumps immunization with 2 doses of MMR vaccine or rubella immunization with 1 dose of MMR vaccine.  Pneumococcal 13-valent conjugate (PCV13) vaccine. When indicated, a person who is uncertain of his immunization history and has no record  of immunization should receive the PCV13 vaccine. An adult aged 2 years or older who has certain medical conditions and has not been previously immunized should receive 1 dose of PCV13 vaccine. This PCV13 should be followed with a dose of pneumococcal polysaccharide (PPSV23) vaccine. The PPSV23 vaccine dose should be obtained at least 8 weeks after the dose of PCV13 vaccine. An adult aged 19 years or older who has certain medical conditions and previously received 1 or more doses of PPSV23 vaccine should receive 1 dose of PCV13. The PCV13 vaccine dose should be obtained 1 or more years after the last PPSV23 vaccine dose.  Pneumococcal polysaccharide (PPSV23) vaccine. When PCV13 is also indicated, PCV13 should be obtained first. All adults aged 65 years and older should be immunized. An adult younger than age 87 years who has certain medical conditions should be immunized. Any person who resides in a nursing home or long-term care facility should be immunized. An adult smoker should be immunized. People with an immunocompromised condition and certain other conditions should receive both PCV13 and PPSV23 vaccines. People with human immunodeficiency virus (HIV) infection should be immunized as soon as possible after diagnosis. Immunization during chemotherapy or radiation therapy should be avoided. Routine use of PPSV23 vaccine is not recommended for American Indians, [place] Natives, or people younger than 65 years unless there are medical conditions that require PPSV23 vaccine. When indicated, people who have unknown immunization and have no record of immunization should receive PPSV23 vaccine. One-time revaccination 5 years after the first dose of PPSV23 is recommended for people aged 19-64 years who have chronic kidney failure, nephrotic syndrome, asplenia, or immunocompromised conditions. People who received 1-2 doses of PPSV23 before age 21 years should receive another dose of PPSV23 vaccine at age 33 years or  later if at least 5 years have passed since the previous dose. Doses of PPSV23 are not needed for people immunized with PPSV23 at or after age 66 years.  Meningococcal vaccine. Adults with asplenia or persistent complement component deficiencies should receive 2 doses of quadrivalent meningococcal conjugate (MenACWY-D) vaccine. The doses should be obtained at least 2 months apart. Microbiologists working with  certain meningococcal bacteria, [employer] recruits, people at risk during an outbreak, and people who travel to or live in countries with a high rate of meningitis should be immunized. A first-year college student up through age 32 years who is living in a residence hall should receive a dose if he did not receive a dose on or after his 16th birthday. Adults who have certain high-risk conditions should receive one or more doses of vaccine.  Hepatitis A vaccine. Adults who wish to be protected from this disease, have certain high-risk conditions, work with hepatitis A-infected animals, work in hepatitis A research labs, or travel to or work in countries with a high rate of hepatitis A should be immunized. Adults who were previously unvaccinated and who anticipate close contact with an international adoptee during the first 60 days after arrival in the [place] from a country with a high rate of hepatitis A should be immunized.  Hepatitis B vaccine. Adults should be immunized if they wish to be protected from this disease, have certain high-risk conditions, may be exposed to blood or other infectious body fluids, are household contacts or sex partners of hepatitis B positive people, are clients or workers in certain care facilities, or travel to or work in countries with a high rate of hepatitis B.  Ages 19 and over  Blood pressure check.** / Every 1 to 2 years.  Lipid and cholesterol check.**/ Every 5 years beginning at age 46.  Lung cancer screening. / Every year if you are aged 72-80 years  and have a 30-pack-year history of smoking and currently smoke or have quit within the past 15 years. Yearly screening is stopped once you have quit smoking for at least 15 years or develop a health problem that would prevent you from having lung cancer treatment.  Fecal occult blood test (FOBT) of stool. / Every year beginning at age 62 and continuing until age 79. You may not have to do this test if you get a colonoscopy every 10 years.  Flexible sigmoidoscopy** or colonoscopy.** / Every 5 years for a flexible sigmoidoscopy or every 10 years for a colonoscopy beginning at age 35 and continuing until age 51.  Hepatitis C blood test.** / For all people born from [birth year] through 1965 and any individual with known risks for hepatitis C.  Abdominal aortic aneurysm (AAA) screening.** / A one-time screening for ages 89 to 30 years who are current or former smokers.  Skin self-exam. / Monthly.  Influenza vaccine. / Every year.  Tetanus, diphtheria, and acellular pertussis (Tdap/Td) vaccine.** / 1 dose of Td every 10 years.  Zoster vaccine.** / 1 dose for adults aged 66 years or older.  Pneumococcal 13-valent conjugate (PCV13) vaccine.** / Consult your health care provider.  Pneumococcal polysaccharide (PPSV23) vaccine.** / 1 dose for all adults aged 42 years and older.  Meningococcal vaccine.** / Consult your health care provider.  Hepatitis A vaccine.** / Consult your health care provider.  Hepatitis B vaccine.** / Consult your health care provider.  Haemophilus influenzae type b (Hib) vaccine.** / Consult your health care provider.

## 2014-02-07 LAB — LIPID PANEL
CHOLESTEROL TOTAL: 179 mg/dL (ref 100–199)
Chol/HDL Ratio: 2.8 ratio units (ref 0.0–5.0)
HDL: 63 mg/dL (ref >39)
LDL Calculated: 93 mg/dL (ref 0–99)
Triglycerides: 114 mg/dL (ref 0–149)
VLDL CHOLESTEROL CAL: 23 mg/dL (ref 5–40)

## 2014-02-07 LAB — CMP14+EGFR
ALT: 31 IU/L (ref 0–44)
AST: 25 IU/L (ref 0–40)
Albumin/Globulin Ratio: 1.4 (ref 1.1–2.5)
Albumin: 4.4 g/dL (ref 3.6–4.8)
Alkaline Phosphatase: 132 IU/L — ABNORMAL HIGH (ref 39–117)
BUN/Creatinine Ratio: 19 (ref 10–22)
BUN: 19 mg/dL (ref 8–27)
CALCIUM: 9.8 mg/dL (ref 8.6–10.2)
CHLORIDE: 95 mmol/L — AB (ref 97–108)
CO2: 24 mmol/L (ref 18–29)
Creatinine, Ser: 0.99 mg/dL (ref 0.76–1.27)
GFR calc Af Amer: 91 mL/min/{1.73_m2} (ref >59)
GFR calc non Af Amer: 79 mL/min/{1.73_m2} (ref >59)
GLUCOSE: 181 mg/dL — AB (ref 65–99)
Globulin, Total: 3.1 g/dL (ref 1.5–4.5)
POTASSIUM: 4.4 mmol/L (ref 3.5–5.2)
Sodium: 135 mmol/L (ref 134–144)
TOTAL PROTEIN: 7.5 g/dL (ref 6.0–8.5)
Total Bilirubin: 0.4 mg/dL (ref 0.0–1.2)

## 2014-02-07 LAB — VITAMIN D 25 HYDROXY (VIT D DEFICIENCY, FRACTURES): Vit D, 25-Hydroxy: 31 ng/mL (ref 30.0–100.0)

## 2014-02-10 ENCOUNTER — Ambulatory Visit: Payer: Medicare Other | Admitting: Physical Therapy

## 2014-02-10 DIAGNOSIS — IMO0001 Reserved for inherently not codable concepts without codable children: Secondary | ICD-10-CM | POA: Diagnosis not present

## 2014-02-12 ENCOUNTER — Ambulatory Visit: Payer: Medicare Other | Admitting: Physical Therapy

## 2014-02-12 DIAGNOSIS — IMO0001 Reserved for inherently not codable concepts without codable children: Secondary | ICD-10-CM | POA: Diagnosis not present

## 2014-02-13 ENCOUNTER — Encounter: Payer: Self-pay | Admitting: Pharmacist

## 2014-02-17 ENCOUNTER — Ambulatory Visit: Payer: Medicare Other | Admitting: Physical Therapy

## 2014-02-17 DIAGNOSIS — IMO0001 Reserved for inherently not codable concepts without codable children: Secondary | ICD-10-CM | POA: Diagnosis not present

## 2014-02-19 ENCOUNTER — Ambulatory Visit: Payer: Medicare Other | Admitting: Physical Therapy

## 2014-02-19 DIAGNOSIS — IMO0001 Reserved for inherently not codable concepts without codable children: Secondary | ICD-10-CM | POA: Diagnosis not present

## 2014-02-24 ENCOUNTER — Encounter: Admitting: Physical Therapy

## 2014-02-26 ENCOUNTER — Ambulatory Visit: Payer: Medicare Other | Admitting: Physical Therapy

## 2014-02-26 DIAGNOSIS — IMO0001 Reserved for inherently not codable concepts without codable children: Secondary | ICD-10-CM | POA: Diagnosis not present

## 2014-03-12 ENCOUNTER — Ambulatory Visit (INDEPENDENT_AMBULATORY_CARE_PROVIDER_SITE_OTHER): Payer: Medicare Other | Admitting: Pharmacist

## 2014-03-12 ENCOUNTER — Encounter: Payer: Self-pay | Admitting: Pharmacist

## 2014-03-12 VITALS — BP 110/78 | HR 72 | Ht 70.0 in | Wt 189.0 lb

## 2014-03-12 DIAGNOSIS — E119 Type 2 diabetes mellitus without complications: Secondary | ICD-10-CM

## 2014-03-12 DIAGNOSIS — I10 Essential (primary) hypertension: Secondary | ICD-10-CM | POA: Diagnosis not present

## 2014-03-12 DIAGNOSIS — E1165 Type 2 diabetes mellitus with hyperglycemia: Secondary | ICD-10-CM

## 2014-03-12 MED ORDER — INSULIN PEN NEEDLE 32G X 6 MM MISC: Status: DC

## 2014-03-12 MED ORDER — INSULIN GLARGINE 100 UNIT/ML SOLOSTAR PEN: PEN_INJECTOR | SUBCUTANEOUS | Status: DC

## 2014-03-12 NOTE — Progress Notes (Signed)
Diabetes Flow Sheet:  Visit 1  Chief Complaint:   Chief Complaint  Patient presents with  . Diabetes     Filed Vitals:   03/12/14 0909  BP: 110/78  Pulse: 72   Filed Weights   03/12/14 0909  Weight: 189 lb (85.73 kg)   HPI:  Most recent  A1C was 8.0% which is increased from 3 month ago when A1c was 7.5% [place] lower than 1 year ago when A1c was 9%.  He has been working hard to limit sugar and high CHO foods.     Current diabetes medcations:  Lantus 70 units Qhs, onzyza  daily and metformin  bid  Exam Edema:  negative  Polyuria:  positive  Polydipsia:  negative Polyphagia:  negative  BMI:  Body mass index is 27.12 kg/(m^2).   Weight changes:  Increased by 3# General Appearance:  alert, oriented, no acute distress, obese and postive abdominal obesity Mood/Affect:  normal and very talkative.    Low fat/carbohydrate diet?  Yes Nicotine Abuse?  No Medication Compliance?  Yes Exercise?  Yes - physical therapy 3x/week Alcohol Abuse?  No  Checks BG 1-2 times a day a varing times.   Averages about 130 fasting and just over 200 after meals Denies any episodes of hypoglycemia  Lab Results  Component Value Date   HGBA1C 8.0 02/06/2014    Lab Results  Component Value Date   CHOL 182 05/09/2013   HDL 63 02/06/2014   LDLCALC 93 02/06/2014   TRIG 114 02/06/2014   CHOLHDL 2.8 02/06/2014     Medication Checklist: ACE Inhibitor/ARB?  yes Lipid Lowering Agent?  No Aspirin?  Yes Oral Hypoglycemic Agent(s)?  Yes  Assessment: 1.  type 2 Diabetes.  Uncontrolled  2.  Blood Pressure Control.  Normal today 3.  Lipid Control.  At goals  Recommendations: 1.  Reviewed 1800 calorie, carbohydrate counting diet.  2.  Continue with physical therapy  - patient also plans to start walking daily. 3.  Reveied goals:  Fasting blood glucose goals are 80-130mg /dL.  Post-prandial goals are < 180.  A1C goals < 7.0%. 4.  LDL goal of < 100, HDL > 40 and TG < 150; BP goal < 140/80 5.  Patient  instructed to continue to check BG 1 - 2 times daily a varying times and reviewed how to respond to unsuitable results. 6.  Medication recommendations at this time are as follows:    Increase Lantus to 75 units daily  RTC in 2 months for labs and establish care with Dr Hyacinth Meeker  Time spent counseling patient:  30 minutes  Referring provider:  Modesto Charon    PharmD:  Henrene Pastor, [facility]

## 2014-03-13 ENCOUNTER — Ambulatory Visit (INDEPENDENT_AMBULATORY_CARE_PROVIDER_SITE_OTHER): Payer: Medicare Other | Admitting: Family

## 2014-03-13 ENCOUNTER — Encounter: Payer: Self-pay | Admitting: Family

## 2014-03-13 VITALS — BP 107/72 | HR 88 | Temp 98.2°F | Ht 70.0 in | Wt 185.5 lb

## 2014-03-13 DIAGNOSIS — H938X9 Other specified disorders of ear, unspecified ear: Secondary | ICD-10-CM | POA: Diagnosis not present

## 2014-03-13 DIAGNOSIS — H938X3 Other specified disorders of ear, bilateral: Secondary | ICD-10-CM

## 2014-03-13 DIAGNOSIS — H6123 Impacted cerumen, bilateral: Secondary | ICD-10-CM

## 2014-03-13 DIAGNOSIS — H6120 Impacted cerumen, unspecified ear: Secondary | ICD-10-CM

## 2014-03-13 NOTE — Patient Instructions (Addendum)
Cerumen Impaction A cerumen impaction is when the wax in your ear forms a plug. This plug usually causes reduced hearing. Sometimes it also causes an earache or dizziness. Removing a cerumen impaction can be difficult and painful. The wax sticks to the ear canal. The canal is sensitive and bleeds easily. If you try to remove a heavy wax buildup with a cotton tipped swab, you may push it in further. Irrigation with water, suction, and small ear curettes may be used to clear out the wax. If the impaction is fixed to the skin in the ear canal, ear drops may be needed for a few days to loosen the wax. People who build up a lot of wax frequently can use ear wax removal products available in your local drugstore. SEEK MEDICAL CARE IF:  You develop an earache, increased hearing loss, or marked dizziness. Document Released: 07/27/2004 Document Revised: 09/11/2011 Document Reviewed: 09/16/2009 [facility] Patient Information [address], [place]. This information is not intended to replace advice given to you by your health care provider. Make sure you discuss any questions you have with your health care provider.  Ear wax drops- Ask pharmacists

## 2014-03-13 NOTE — Progress Notes (Signed)
   Subjective:    Patient ID: Jon [name], male    DOB: Jan 09, 1946, 68 y.o.   MRN: [id number]  Ear Fullness  There is pain in both ears. This is a recurrent problem. The current episode started more than 1 month ago. The problem occurs constantly. The problem has been unchanged. There has been no fever. The pain is at a severity of 1/10. The pain is mild. Pertinent negatives include no coughing, diarrhea, drainage, ear discharge, headaches, hearing loss, rhinorrhea or sore throat. He has tried nothing for the symptoms. The treatment provided no relief.      Review of Systems  Constitutional: Negative.   HENT: Negative.  Negative for ear discharge, hearing loss, rhinorrhea and sore throat.   Respiratory: Negative.  Negative for cough.   Cardiovascular: Negative.   Gastrointestinal: Negative.  Negative for diarrhea.  Endocrine: Negative.   Genitourinary: Negative.   Musculoskeletal: Negative.   Neurological: Negative.  Negative for headaches.  Hematological: Negative.   Psychiatric/Behavioral: Negative.   All other systems reviewed and are negative.      Objective:   Physical Exam  Vitals reviewed. Constitutional: He is oriented to person, place, and time. He appears well-developed and well-nourished. No distress.  HENT:  Head: Normocephalic.  Right Ear: External ear normal.  Left Ear: External ear normal.  Mouth/Throat: Oropharynx is clear and moist.  Eyes: Pupils are equal, round, and reactive to light. Right eye exhibits no discharge. Left eye exhibits no discharge.  Neck: Normal range of motion. Neck supple. No thyromegaly present.  Cardiovascular: Normal rate, regular rhythm, normal heart sounds and intact distal pulses.   No murmur heard. Pulmonary/Chest: Effort normal and breath sounds normal. No respiratory distress. He has no wheezes.  Abdominal: Soft. Bowel sounds are normal. He exhibits no distension. There is no tenderness.  Musculoskeletal: Normal range of motion.  He exhibits no edema and no tenderness.  Neurological: He is alert and oriented to person, place, and time. He has normal reflexes. No cranial nerve deficit.  Skin: Skin is warm and dry. No rash noted. No erythema.  Psychiatric: He has a normal mood and affect. His behavior is normal. Judgment and thought content normal.      BP 107/72  Pulse 88  Temp(Src) 98.2 F (36.8 C) (Oral)  Ht  (1.778 m)  Wt 185 lb 8 oz (84.142 kg)  BMI 26.62 kg/m2     Assessment & Plan:  1. Ear fullness, bilateral  2. Cerumen impaction, bilateral   Keep ears clean and dry May need to use ear drops to loosen wax RTO prn  Jannifer Rodney, FNP

## 2014-05-13 ENCOUNTER — Ambulatory Visit: Payer: Self-pay | Admitting: Family Medicine

## 2014-06-24 ENCOUNTER — Encounter: Payer: Self-pay | Admitting: Family Medicine

## 2014-06-24 ENCOUNTER — Telehealth: Payer: Self-pay | Admitting: *Deleted

## 2014-06-24 ENCOUNTER — Ambulatory Visit (INDEPENDENT_AMBULATORY_CARE_PROVIDER_SITE_OTHER): Payer: Medicare Other | Admitting: Family Medicine

## 2014-06-24 VITALS — BP 99/59 | HR 72 | Temp 97.5°F | Ht 70.0 in | Wt 189.0 lb

## 2014-06-24 DIAGNOSIS — E119 Type 2 diabetes mellitus without complications: Secondary | ICD-10-CM

## 2014-06-24 DIAGNOSIS — G8929 Other chronic pain: Secondary | ICD-10-CM

## 2014-06-24 DIAGNOSIS — M25569 Pain in unspecified knee: Secondary | ICD-10-CM

## 2014-06-24 LAB — POCT GLYCOSYLATED HEMOGLOBIN (HGB A1C): Hemoglobin A1C: 8.3

## 2014-06-24 NOTE — Telephone Encounter (Signed)
Aware of lab results  

## 2014-06-24 NOTE — Progress Notes (Signed)
   Subjective:    Patient ID: Jon [name], male    DOB: [birth year], 68 y.o.   MRN: [id number]  HPI 68 year old male here to follow-up diabetes. His main complaint today is that he feels like there something in his ears and that his ears itch. He has seen several doctors in the past for balance issues with results of testing apparently been negative. He also complains of some knee discomfort as well as weakness in his legs after walking a bit. This is not described his pain such as one might find with claudication.    Review of Systems  Constitutional: Negative.   HENT: Negative for ear discharge.        Fullness and itching in his ears  Eyes: Negative.   Respiratory: Negative.   Cardiovascular: Negative.   Genitourinary: Negative.   Musculoskeletal: Negative.   Neurological: Negative.   Psychiatric/Behavioral: Negative.        Objective:   Physical Exam  HENT:  TMs are dull and immobile with Valsalva maneuver  Cardiovascular: Normal rate and regular rhythm.   Pulmonary/Chest: Effort normal and breath sounds normal.    BP 99/59 mmHg  Pulse 72  Temp(Src) 97.5 F (36.4 C) (Oral)  Ht 5\' 10"  (1.778 m)  Wt 189 lb (85.73 kg)  BMI 27.12 kg/m2      Assessment & Plan:  1. Knee pain, chronic, unspecified laterality With weakness consider polymyalgia. Will check sedimentation rate - Sedimentation rate  2. Type 2 diabetes mellitus without complication  - POCT glycosylated hemoglobin (Hb A1C)Jon Paul HalfM Miller MD

## 2014-06-25 ENCOUNTER — Telehealth: Payer: Self-pay | Admitting: *Deleted

## 2014-06-25 LAB — SEDIMENTATION RATE: SED RATE: 4 mm/h (ref 0–30)

## 2014-06-25 NOTE — Telephone Encounter (Signed)
-----   Message from Frederica KusterStephen M Miller, MD sent at 06/25/2014  7:38 AM EST ----- Normal sedimentation rate effectively rules out polymyalgia which was a consideration at the time of his visit

## 2014-06-25 NOTE — Telephone Encounter (Signed)
No answer

## 2014-07-21 ENCOUNTER — Encounter: Payer: Self-pay | Admitting: *Deleted

## 2014-08-04 ENCOUNTER — Other Ambulatory Visit: Payer: Self-pay | Admitting: Pharmacist

## 2014-09-14 ENCOUNTER — Telehealth: Payer: Self-pay | Admitting: Family Medicine

## 2014-09-14 DIAGNOSIS — R413 Other amnesia: Secondary | ICD-10-CM

## 2014-09-14 NOTE — Telephone Encounter (Signed)
Patient is requesting a referral to neurologist patient is having trouble with his memory and behavior. They are also requesting a referral for his knee pain, feet pain. He saw Hyacinth MeekerMiller for this is 12.

## 2014-09-14 NOTE — Telephone Encounter (Signed)
Okay to refer back to neuro

## 2014-09-15 NOTE — Telephone Encounter (Signed)
See referral from 03/19/13

## 2014-09-24 ENCOUNTER — Encounter: Payer: Self-pay | Admitting: Family Medicine

## 2014-09-24 ENCOUNTER — Ambulatory Visit (INDEPENDENT_AMBULATORY_CARE_PROVIDER_SITE_OTHER): Payer: Medicare Other | Admitting: Family Medicine

## 2014-09-24 VITALS — BP 113/69 | HR 87 | Temp 97.0°F | Ht 70.0 in | Wt 193.0 lb

## 2014-09-24 DIAGNOSIS — E1165 Type 2 diabetes mellitus with hyperglycemia: Secondary | ICD-10-CM | POA: Diagnosis not present

## 2014-09-24 DIAGNOSIS — R945 Abnormal results of liver function studies: Secondary | ICD-10-CM

## 2014-09-24 DIAGNOSIS — I10 Essential (primary) hypertension: Secondary | ICD-10-CM | POA: Diagnosis not present

## 2014-09-24 DIAGNOSIS — R7989 Other specified abnormal findings of blood chemistry: Secondary | ICD-10-CM

## 2014-09-24 DIAGNOSIS — F068 Other specified mental disorders due to known physiological condition: Secondary | ICD-10-CM

## 2014-09-24 DIAGNOSIS — E785 Hyperlipidemia, unspecified: Secondary | ICD-10-CM

## 2014-09-24 DIAGNOSIS — E119 Type 2 diabetes mellitus without complications: Secondary | ICD-10-CM | POA: Diagnosis not present

## 2014-09-24 DIAGNOSIS — F09 Unspecified mental disorder due to known physiological condition: Secondary | ICD-10-CM

## 2014-09-24 LAB — POCT UA - MICROALBUMIN: MICROALBUMIN (UR) POC: 20 mg/L

## 2014-09-24 LAB — POCT GLYCOSYLATED HEMOGLOBIN (HGB A1C): Hemoglobin A1C: 8.2

## 2014-09-24 MED ORDER — GABAPENTIN 300 MG PO CAPS: 300.0000 mg | ORAL_CAPSULE | Freq: Three times a day (TID) | ORAL | Status: DC

## 2014-09-24 NOTE — Progress Notes (Signed)
   Subjective:    Patient ID: Jon [name], male    DOB: 09/28/1945, 69 y.o.   MRN: [id number]  HPI  69 year old gentleman who is accompanied today by his girlfriend. She has lots of issues and concerns about change in personality memory loss. His chief complaint is pain in his left foot. He does have diabetes and has been checked one or 2 years ago for neuropathy. There are also balance issues and he has had what sounds like a very thorough neurologic workup for that symptom. Changes in behavior include cursing, temper outbursts, and general apathy such that some days he does not get dressed and stays around the house choosing to watch TV.    Review of Systems  Constitutional: Negative.   HENT: Negative.   Respiratory: Negative.   Cardiovascular: Negative.   Genitourinary: Negative.  Negative for dysuria.  Psychiatric/Behavioral: Positive for confusion and decreased concentration.   Patient Active Problem List   Diagnosis Date Noted  . Mild cognitive disorder 05/15/2013  . Vitamin D deficiency   . Hyperlipidemia 03/19/2013  . DM (diabetes mellitus) 02/06/2013  . HTN (hypertension) 02/06/2013  . AAA (abdominal aortic aneurysm) without rupture 02/06/2013  . Clubbing of fingers 02/06/2013  . Ex-smoker 02/06/2013  . History of unsteady gait 02/06/2013   Outpatient Encounter Prescriptions as of 09/24/2014  Medication Sig  . aspirin 81 MG tablet Take 81 mg by mouth daily.  . Cholecalciferol (VITAMIN D) 1000 UNITS capsule Take 1 capsule (1,000 Units total) by mouth daily.  [name] glucose blood (ACCU-CHEK SMARTVIEW) test strip Use to check BG twice a day.  DX:  250.02 uncontrolled type 2 diabetes.  [name] LANTUS SOLOSTAR 100 UNIT/ML Solostar Pen INJECT UP TO 75 UNITS DAILY AS DIRECTED  . lisinopril (PRINIVIL,ZESTRIL) 2.5 MG tablet Take 1 tablet (2.5 mg total) by mouth daily.  . metFORMIN (GLUCOPHAGE) 1000 MG tablet Take 1 tablet (1,000 mg total) by mouth 2 (two) times daily with a meal.  .  saxagliptin HCl (ONGLYZA) 5 MG TABS tablet Take 1 tablet (5 mg total) by mouth daily.  . SURE COMFORT PEN NEEDLES 32G X 6 MM MISC USE WITH LANTUS PEN DAILY      Objective:   Physical Exam  Constitutional: He is oriented to person, place, and time. He appears well-developed and well-nourished.  Cardiovascular: Normal rate and regular rhythm.   Pulmonary/Chest: Effort normal and breath sounds normal.  Musculoskeletal: Normal range of motion.  Neurological: He is alert and oriented to person, place, and time.          Assessment & Plan:  1. Type 2 diabetes mellitus with hyperglycemia May have some early neuropathy. I would like to initiate a trial of Neurontin and recheck him in one month  - POCT glycosylated hemoglobin (Hb A1C) - POCT UA - Microalbumin  2. Hyperlipidemia   3. Essential hypertension   4. Elevated LFTs  - CMP14+EGFR  5. Mild cognitive disorder Scored 30 out of 30 on mental status exam. From the history of significant other really sounds like there may be a problem I would consider possible atypical depression with psychomotor retardation and have asked them to consider beginning an antidepressant to help treat and make diagnosis.

## 2014-09-25 LAB — CMP14+EGFR
A/G RATIO: 1.4 (ref 1.1–2.5)
ALBUMIN: 4.2 g/dL (ref 3.6–4.8)
ALK PHOS: 106 IU/L (ref 39–117)
ALT: 30 IU/L (ref 0–44)
AST: 24 IU/L (ref 0–40)
BILIRUBIN TOTAL: 0.4 mg/dL (ref 0.0–1.2)
BUN / CREAT RATIO: 16 (ref 10–22)
BUN: 16 mg/dL (ref 8–27)
CO2: 20 mmol/L (ref 18–29)
Calcium: 9.5 mg/dL (ref 8.6–10.2)
Chloride: 100 mmol/L (ref 97–108)
Creatinine, Ser: 1.02 mg/dL (ref 0.76–1.27)
GFR, EST AFRICAN AMERICAN: 88 mL/min/{1.73_m2} (ref >59)
GFR, EST NON AFRICAN AMERICAN: 76 mL/min/{1.73_m2} (ref >59)
Globulin, Total: 2.9 g/dL (ref 1.5–4.5)
Glucose: 270 mg/dL — ABNORMAL HIGH (ref 65–99)
Potassium: 5 mmol/L (ref 3.5–5.2)
SODIUM: 138 mmol/L (ref 134–144)
Total Protein: 7.1 g/dL (ref 6.0–8.5)

## 2014-09-25 LAB — MICROALBUMIN, URINE: Microalbumin, Urine: 48.5 ug/mL — ABNORMAL HIGH (ref 0.0–17.0)

## 2014-09-29 ENCOUNTER — Telehealth: Payer: Self-pay | Admitting: *Deleted

## 2014-09-29 NOTE — Telephone Encounter (Signed)
-----   Message from Frederica KusterStephen M Miller, MD sent at 09/28/2014  7:51 AM EDT ----- There is protein in the urine up some from one year ago and metabolic panel shows elevated sugar but normal liver enzymes

## 2014-09-29 NOTE — Telephone Encounter (Signed)
Aware of results. 

## 2014-10-15 ENCOUNTER — Other Ambulatory Visit: Payer: Self-pay | Admitting: Family Medicine

## 2014-10-22 ENCOUNTER — Ambulatory Visit: Payer: Medicare Other | Admitting: Family Medicine

## 2014-10-26 ENCOUNTER — Ambulatory Visit: Payer: Medicare Other | Admitting: Family Medicine

## 2014-11-10 ENCOUNTER — Telehealth: Payer: Self-pay | Admitting: Family Medicine

## 2014-11-10 ENCOUNTER — Other Ambulatory Visit: Payer: Self-pay | Admitting: Family Medicine

## 2014-11-10 ENCOUNTER — Other Ambulatory Visit: Payer: Self-pay | Admitting: *Deleted

## 2014-11-10 MED ORDER — LISINOPRIL 2.5 MG PO TABS: 2.5000 mg | ORAL_TABLET | Freq: Every day | ORAL | Status: DC

## 2014-11-10 MED ORDER — METFORMIN HCL 1000 MG PO TABS: 1000.0000 mg | ORAL_TABLET | Freq: Two times a day (BID) | ORAL | Status: DC

## 2014-11-10 NOTE — Telephone Encounter (Signed)
Wants 30 day supply sent to [facility] of l[facility]ril and metformin until med order arrives

## 2014-11-10 NOTE — Telephone Encounter (Signed)
done

## 2014-12-03 ENCOUNTER — Ambulatory Visit (INDEPENDENT_AMBULATORY_CARE_PROVIDER_SITE_OTHER): Payer: Medicare Other | Admitting: Family Medicine

## 2014-12-03 ENCOUNTER — Encounter: Payer: Self-pay | Admitting: Family Medicine

## 2014-12-03 VITALS — BP 114/66 | HR 73 | Temp 98.0°F | Ht 70.0 in | Wt 189.0 lb

## 2014-12-03 DIAGNOSIS — F068 Other specified mental disorders due to known physiological condition: Secondary | ICD-10-CM

## 2014-12-03 DIAGNOSIS — F09 Unspecified mental disorder due to known physiological condition: Secondary | ICD-10-CM

## 2014-12-03 DIAGNOSIS — Z87898 Personal history of other specified conditions: Secondary | ICD-10-CM | POA: Diagnosis not present

## 2014-12-03 DIAGNOSIS — I10 Essential (primary) hypertension: Secondary | ICD-10-CM

## 2014-12-03 MED ORDER — INSULIN GLARGINE 100 UNIT/ML SOLOSTAR PEN: 75.0000 [IU] | PEN_INJECTOR | Freq: Every day | SUBCUTANEOUS | Status: DC

## 2014-12-03 MED ORDER — METFORMIN HCL 1000 MG PO TABS: 1000.0000 mg | ORAL_TABLET | Freq: Two times a day (BID) | ORAL | Status: DC

## 2014-12-03 MED ORDER — LISINOPRIL 2.5 MG PO TABS: 2.5000 mg | ORAL_TABLET | Freq: Every day | ORAL | Status: DC

## 2014-12-03 MED ORDER — SAXAGLIPTIN HCL 5 MG PO TABS: 5.0000 mg | ORAL_TABLET | Freq: Every day | ORAL | Status: DC

## 2014-12-03 NOTE — Addendum Note (Signed)
Addended by: Gwenith DailyHUDY, Oluwatoyin Banales N on: 12/03/2014 05:07 PM   Modules accepted: Orders

## 2014-12-03 NOTE — Addendum Note (Signed)
Addended by: Gwenith DailyHUDY, KRISTEN N on: 12/03/2014 12:09 PM   Modules accepted: Orders

## 2014-12-03 NOTE — Progress Notes (Signed)
Subjective:    Patient ID: Jon [name], male    DOB: 01-30-1946, 69 y.o.   MRN: [id number]  HPI  69 year old man here to follow-up diabetes. He has similar symptoms as last visit pain in his foot. He questions heel spur. I explained that I could evaluate that but he desires to see a sports medicine doctor for some reason and will provide the name who he wants to be referred to. He also has some other pain in his legs and we spent some time talking about neuropathy but he rejects that as a calls even though he is a diabetic he was told about 2 years ago by a neurologist that he did not have neuropathy so he's pretty sure that it's not he does have some knee pain and we talked about degenerative arthritis in he seems to agree that that might be a possibility. He also had an episode of dizziness where he fell out of the shower was associated with vomiting but then seemed to clear. He denies any palpitations. He checked his sugar and blood pressure and they were normal at that time.  Patient Active Problem List   Diagnosis Date Noted  . Mild cognitive disorder 05/15/2013  . Vitamin D deficiency   . Hyperlipidemia 03/19/2013  . DM (diabetes mellitus) 02/06/2013  . HTN (hypertension) 02/06/2013  . AAA (abdominal aortic aneurysm) without rupture 02/06/2013  . Clubbing of fingers 02/06/2013  . Ex-smoker 02/06/2013  . History of unsteady gait 02/06/2013   Outpatient Encounter Prescriptions as of 12/03/2014  Medication Sig  . aspirin 81 MG tablet Take 81 mg by mouth daily.  . Cholecalciferol (VITAMIN D) 1000 UNITS capsule Take 1 capsule (1,000 Units total) by mouth daily.  [name]. gabapentin (NEURONTIN) 300 MG capsule Take 1 capsule (300 mg total) by mouth 3 (three) times daily.  [name]. glucose blood (ACCU-CHEK SMARTVIEW) test strip Use to check BG twice a day.  DX:  250.02 uncontrolled type 2 diabetes.  [name]. LANTUS SOLOSTAR 100 UNIT/ML Solostar Pen INJECT UP TO 75 UNITS DAILY AS DIRECTED  . lisinopril  (PRINIVIL,ZESTRIL) 2.5 MG tablet Take 1 tablet (2.5 mg total) by mouth daily.  . metFORMIN (GLUCOPHAGE) 1000 MG tablet Take 1 tablet (1,000 mg total) by mouth 2 (two) times daily with a meal.  . saxagliptin HCl (ONGLYZA) 5 MG TABS tablet Take 1 tablet (5 mg total) by mouth daily.  Ronette Deter. SURE COMFORT PEN NEEDLES 32G X 6 MM MISC USE WITH LANTUS PEN DAILY   No facility-administered encounter medications on file as of 12/03/2014.       Review of Systems  Constitutional: Negative.   Respiratory: Positive for cough.   Cardiovascular: Negative.   Neurological: Positive for dizziness.  Psychiatric/Behavioral: Negative.        Objective:   Physical Exam  Constitutional: He is oriented to person, place, and time. He appears well-developed and well-nourished.  HENT:  Head: Normocephalic.  Cardiovascular: Normal rate and regular rhythm.   Pulmonary/Chest: Effort normal and breath sounds normal.  Neurological: He is alert and oriented to person, place, and time.  Psychiatric: He has a normal mood and affect.    BP 114/66 mmHg  Pulse 73  Temp(Src) 98 F (36.7 C) (Oral)  Ht 5\' 10"  (1.778 m)  Wt 189 lb (85.73 kg)  BMI 27.12 kg/m2       Assessment & Plan:  1. Essential hypertension Blood pressures have been good on low-dose lisinopril. I think he takes this medicine more for  renal protective effect and blood pressure  2. Mild cognitive disorder He demonstrates good memory today I do not think that any change in cognition. When asked about temper outbursts he tends to shrug that off  3. History of unsteady gait No change on exam. As noted he has issues with tandem gait but Romberg is negative.  Frederica Kuster MD

## 2014-12-10 ENCOUNTER — Other Ambulatory Visit: Payer: Self-pay | Admitting: Family Medicine

## 2014-12-15 ENCOUNTER — Telehealth: Payer: Self-pay | Admitting: Family Medicine

## 2014-12-16 NOTE — Telephone Encounter (Signed)
Rx left up front for patient pick up. Patients wife notified

## 2015-02-11 DIAGNOSIS — I6523 Occlusion and stenosis of bilateral carotid arteries: Secondary | ICD-10-CM | POA: Diagnosis not present

## 2015-02-11 DIAGNOSIS — E1151 Type 2 diabetes mellitus with diabetic peripheral angiopathy without gangrene: Secondary | ICD-10-CM | POA: Diagnosis not present

## 2015-02-11 DIAGNOSIS — I10 Essential (primary) hypertension: Secondary | ICD-10-CM | POA: Diagnosis not present

## 2015-02-11 DIAGNOSIS — E780 Pure hypercholesterolemia: Secondary | ICD-10-CM | POA: Diagnosis not present

## 2015-02-11 DIAGNOSIS — I714 Abdominal aortic aneurysm, without rupture: Secondary | ICD-10-CM | POA: Diagnosis not present

## 2015-02-11 DIAGNOSIS — I719 Aortic aneurysm of unspecified site, without rupture: Secondary | ICD-10-CM | POA: Diagnosis not present

## 2015-02-15 ENCOUNTER — Encounter: Payer: Self-pay | Admitting: *Deleted

## 2015-02-22 DIAGNOSIS — R569 Unspecified convulsions: Secondary | ICD-10-CM | POA: Diagnosis not present

## 2015-02-22 DIAGNOSIS — G3184 Mild cognitive impairment, so stated: Secondary | ICD-10-CM | POA: Diagnosis not present

## 2015-03-09 ENCOUNTER — Encounter: Payer: Self-pay | Admitting: Family Medicine

## 2015-03-09 ENCOUNTER — Ambulatory Visit (INDEPENDENT_AMBULATORY_CARE_PROVIDER_SITE_OTHER): Payer: Medicare Other | Admitting: Family Medicine

## 2015-03-09 VITALS — BP 102/57 | HR 69 | Temp 97.3°F | Ht 70.0 in | Wt 189.0 lb

## 2015-03-09 DIAGNOSIS — I10 Essential (primary) hypertension: Secondary | ICD-10-CM | POA: Diagnosis not present

## 2015-03-09 DIAGNOSIS — E1165 Type 2 diabetes mellitus with hyperglycemia: Secondary | ICD-10-CM | POA: Diagnosis not present

## 2015-03-09 DIAGNOSIS — E785 Hyperlipidemia, unspecified: Secondary | ICD-10-CM

## 2015-03-09 LAB — POCT GLYCOSYLATED HEMOGLOBIN (HGB A1C): Hemoglobin A1C: 13

## 2015-03-09 NOTE — Progress Notes (Signed)
Subjective:    Patient ID: Jon [name], male    DOB: 1946-04-21, 69 y.o.   MRN: [id number]  HPI 69 year old gentleman who is here to follow-up diabetes. We spent some time talking about his knee pain. Apparently has some degenerative arthritis. He does use a cane to walk but he has some gait instability possibly due to neuropathy although he denies that. Sugars and generally been good fasting when he checks at home. He is on combination Onglyza, metformin, and Lantus.  Patient Active Problem List   Diagnosis Date Noted  . Mild cognitive disorder 05/15/2013  . Vitamin D deficiency   . Hyperlipidemia 03/19/2013  . DM (diabetes mellitus) 02/06/2013  . HTN (hypertension) 02/06/2013  . AAA (abdominal aortic aneurysm) without rupture 02/06/2013  . Clubbing of fingers 02/06/2013  . Ex-smoker 02/06/2013  . History of unsteady gait 02/06/2013   Outpatient Encounter Prescriptions as of 03/09/2015  Medication Sig  . aspirin 81 MG tablet Take 81 mg by mouth daily.  . Cholecalciferol (VITAMIN D) 1000 UNITS capsule Take 1 capsule (1,000 Units total) by mouth daily.  [name] glucose blood (ACCU-CHEK SMARTVIEW) test strip Use to check BG twice a day.  DX:  250.02 uncontrolled type 2 diabetes.  . Insulin Glargine (LANTUS SOLOSTAR) 100 UNIT/ML Solostar Pen Inject 75 Units into the skin daily.  [name] lisinopril (PRINIVIL,ZESTRIL) 2.5 MG tablet Take 1 tablet (2.5 mg total) by mouth daily.  . metFORMIN (GLUCOPHAGE) 1000 MG tablet Take 1 tablet (1,000 mg total) by mouth 2 (two) times daily with a meal.  . saxagliptin HCl (ONGLYZA) 5 MG TABS tablet Take 1 tablet (5 mg total) by mouth daily.  . SURE COMFORT PEN NEEDLES 32G X 6 MM MISC USE WITH LANTUS PEN DAILY  . [DISCONTINUED] gabapentin (NEURONTIN) 300 MG capsule Take 1 capsule (300 mg total) by mouth 3 (three) times daily.  . [DISCONTINUED] metFORMIN (GLUCOPHAGE) 1000 MG tablet TAKE 1 TABLET (1,000 MG TOTAL) BY MOUTH 2 (TWO) TIMES DAILY WITH A MEAL.   No  facility-administered encounter medications on file as of 03/09/2015.        Review of Systems  Constitutional: Negative.   Respiratory: Negative.   Cardiovascular: Negative.   Gastrointestinal: Negative.   Musculoskeletal: Positive for arthralgias.  Neurological: Negative.   Psychiatric/Behavioral: Negative.        Objective:   Physical Exam  Constitutional: He is oriented to person, place, and time. He appears well-developed and well-nourished.  HENT:  Head: Normocephalic.  Cardiovascular: Normal rate, regular rhythm, normal heart sounds and intact distal pulses.   Pulmonary/Chest: Effort normal and breath sounds normal.  Musculoskeletal: Normal range of motion.  Neurological: He is alert and oriented to person, place, and time.  Psychiatric: He has a normal mood and affect. His behavior is normal.    BP 102/57 mmHg  Pulse 69  Temp(Src) 97.3 F (36.3 C) (Oral)  Ht  (1.778 m)  Wt 189 lb (85.73 kg)  BMI 27.12 kg/m2       Assessment & Plan:  1. Type 2 diabetes mellitus with hyperglycemia By history, diabetes is controlled. Last A1c is elevated today will continue same - POCT glycosylated hemoglobin (Hb A1C)  2. Essential hypertension Blood pressure is very good. He is on low-dose lisinopril, more for renal protection and blood pressure. He does have have aortic aneurysm. That is followed yearly in stable.  3. Hyperlipidemia He is on no medications for lipids; LDL is less than 100  Frederica Kuster MD

## 2015-03-10 DIAGNOSIS — R569 Unspecified convulsions: Secondary | ICD-10-CM | POA: Diagnosis not present

## 2015-03-12 ENCOUNTER — Ambulatory Visit: Payer: Medicare Other | Admitting: Family Medicine

## 2015-03-12 ENCOUNTER — Encounter: Payer: Self-pay | Admitting: Pharmacist

## 2015-03-12 ENCOUNTER — Ambulatory Visit (INDEPENDENT_AMBULATORY_CARE_PROVIDER_SITE_OTHER): Payer: Medicare Other | Admitting: Pharmacist

## 2015-03-12 VITALS — BP 110/67 | HR 70 | Ht 70.0 in | Wt 187.0 lb

## 2015-03-12 DIAGNOSIS — E119 Type 2 diabetes mellitus without complications: Secondary | ICD-10-CM

## 2015-03-12 DIAGNOSIS — R809 Proteinuria, unspecified: Secondary | ICD-10-CM | POA: Diagnosis not present

## 2015-03-12 DIAGNOSIS — E1129 Type 2 diabetes mellitus with other diabetic kidney complication: Secondary | ICD-10-CM | POA: Insufficient documentation

## 2015-03-12 DIAGNOSIS — E1165 Type 2 diabetes mellitus with hyperglycemia: Secondary | ICD-10-CM | POA: Diagnosis not present

## 2015-03-12 LAB — POCT GLYCOSYLATED HEMOGLOBIN (HGB A1C): HEMOGLOBIN A1C: 8.4

## 2015-03-12 MED ORDER — INSULIN GLARGINE 300 UNIT/ML ~~LOC~~ SOPN: 80.0000 [IU] | PEN_INJECTOR | Freq: Every day | SUBCUTANEOUS | Status: DC

## 2015-03-12 NOTE — Progress Notes (Signed)
  Subjective:    Jon [name] is a 69 y.o. male who presents for assessment of Type 2 diabetes mellitus.  I last saw patient about 1 year ago.  A1c has been increasing over the last year and at last visit was 13%.  However when I questioned patient about HBG readings he reports that highest reading he has gotten has been mid 200's and is mostly 120 to 150 in the am.  I had the lab rerun A1c today and today A1c was 8.4% which more closely corresponds to HBG readings.  Over the last Current symptoms/problems include hyperglycemia and have been stable.  Current diabetes medications includes:  lantus 75 units daiy;  onglyze  1 tablet daily and metformin  bid.   Known diabetic complications: cardiovascular disease and cerebrovascular disease Cardiovascular risk factors: advanced age (older than 64 for men, 32 for women), diabetes mellitus, dyslipidemia, family history of premature cardiovascular disease, hypertension, male gender and microalbuminuria Eye exam current (within one year): yes Weight trend: stable Prior visit with dietician: yes -   Current diet: in general, a "healthy" diet   Current exercise: none  Current monitoring regimen: home blood tests - two times daily Home blood sugar records: am - 120 to 150;  post prandial = 200's Any episodes of hypoglycemia? no  Is He on ACE inhibitor or angiotensin II receptor blocker?  Yes  lisinopril (Prinivil)    The following portions of the patient's history were reviewed and updated as appropriate: allergies, current medications, past family history, past medical history, past social history, past surgical history and problem list.    Objective:    BP 110/67 mmHg  Pulse 70  Ht  (1.778 m)  Wt 187 lb (84.823 kg)  BMI 26.83 kg/m2  Lab Review GLUCOSE (mg/dL)  Date Value  40/98/1191 270*  02/06/2014 181*  11/06/2013 111*   CO2 (mmol/L)  Date Value  09/24/2014 20  02/06/2014 24  11/06/2013 22   BUN (mg/dL)  Date  Value  47/82/9562 16  02/06/2014 19  11/06/2013 16   CREATININE, SER (mg/dL)  Date Value  13/01/6577 1.02  02/06/2014 0.99  11/06/2013 1.01    A1c = 8.4% today  Assessment:    Diabetes Mellitus type II, under inadequate control.    Plan:    1.  Rx changes: changed Lantus to Toujeo and increased done to 78 units for 2 days.  If blood glucose still over 120 fasting in the morning then increase to 80 units once daily. 2.  Education: Reviewed 'ABCs' of diabetes management (respective goals in parentheses):  A1C (<7), blood pressure (<130/80), and cholesterol (LDL <100). 3.  Compliance at present is estimated to be good. Efforts to improve compliance (if necessary) will be directed at dietary modifications: reviewed CHO counting diet.  Patient has not been comliant and admits to dietary indescretions.. 4.  Increase physical activity - walking or other exercise - start with 10 minutes daily and increase to 30 minutes as able 5.  Follow up: 1 month    Henrene Pastor, PharmD, CPP, CDE

## 2015-03-17 ENCOUNTER — Telehealth: Payer: Self-pay | Admitting: Family Medicine

## 2015-03-17 NOTE — Telephone Encounter (Signed)
This is the first phone call I have received from this patient since I saw him last week I returned his called.  It appears that Toujeo required prior authorization.  He states that [facility] has attempted to contact us for PA.  I spoke with our PA department and  PA form was received but I could on tell date of receipt.   I have filled out PA and Dr Hyacinth Meeker will need to sign when he is here tomorrow.   In the meantime, I left Jon [name] a sample for Toujeo in the refridge to pick up at his convenience.

## 2015-03-17 NOTE — Telephone Encounter (Signed)
TC to pt to let him know that a Toujeo sample is here at the office for him to pickup until his PA is signed by Dr. Hyacinth Meeker who will be working again next Tuesday.

## 2015-03-25 DIAGNOSIS — M25562 Pain in left knee: Secondary | ICD-10-CM | POA: Diagnosis not present

## 2015-03-25 DIAGNOSIS — M79672 Pain in left foot: Secondary | ICD-10-CM | POA: Diagnosis not present

## 2015-03-25 DIAGNOSIS — M25561 Pain in right knee: Secondary | ICD-10-CM | POA: Diagnosis not present

## 2015-03-26 ENCOUNTER — Telehealth: Payer: Self-pay | Admitting: Pharmacist

## 2015-03-26 MED ORDER — INSULIN GLARGINE 300 UNIT/ML ~~LOC~~ SOPN: 80.0000 [IU] | PEN_INJECTOR | Freq: Every day | SUBCUTANEOUS | Status: DC

## 2015-03-26 NOTE — Telephone Encounter (Signed)
Patient's [employer] denied coverage / PA for Toujeo.  Only allowed if using greater than 100 units and if tried Lanus in split dosing and has hypoglycemia.  Patient notified  [facility] and tried on his Medicare plan.

## 2015-03-31 ENCOUNTER — Ambulatory Visit (INDEPENDENT_AMBULATORY_CARE_PROVIDER_SITE_OTHER): Payer: Medicare Other | Admitting: Physician Assistant

## 2015-03-31 ENCOUNTER — Encounter: Payer: Self-pay | Admitting: Physician Assistant

## 2015-03-31 VITALS — BP 103/57 | HR 75 | Ht 70.0 in | Wt 186.0 lb

## 2015-03-31 DIAGNOSIS — B86 Scabies: Secondary | ICD-10-CM

## 2015-03-31 DIAGNOSIS — L309 Dermatitis, unspecified: Secondary | ICD-10-CM | POA: Diagnosis not present

## 2015-03-31 MED ORDER — TRIAMCINOLONE ACETONIDE 0.1 % EX CREA: 1.0000 "application " | TOPICAL_CREAM | Freq: Two times a day (BID) | CUTANEOUS | Status: DC

## 2015-03-31 MED ORDER — PERMETHRIN 5 % EX CREA: TOPICAL_CREAM | CUTANEOUS | Status: DC

## 2015-03-31 MED ORDER — HYDROXYZINE HCL 10 MG PO TABS: 10.0000 mg | ORAL_TABLET | Freq: Three times a day (TID) | ORAL | Status: DC | PRN

## 2015-03-31 NOTE — Progress Notes (Signed)
   Subjective:    Patient ID: Jon [name], male    DOB: 1945/08/27, 69 y.o.   MRN: [id number]  HPI 69 y/o male presents with rash on entire body after walking in the woods. Lesions are itching. Constant. Has tried otc anti itch medication with mild relief. Wife was treated 3 weeks ago with similar symptoms.     Review of Systems  Constitutional: Positive for fever.  Gastrointestinal: Negative.   Skin: Positive for rash.       Objective:   Physical Exam  Constitutional: He is oriented to person, place, and time. He appears well-developed and well-nourished.  Neurological: He is alert and oriented to person, place, and time.  Skin: Rash noted. There is erythema.  Psychiatric: He has a normal mood and affect. His behavior is normal. Judgment and thought content normal.  Nursing note and vitals reviewed.         Assessment & Plan:  1. Dermatitis  - permethrin (ELIMITE) 5 % cream; Apply neck down. Leave on overnight and wash off in morning  Dispense: 60 g; Refill: 0 - triamcinolone cream (KENALOG) 0.1 %; Apply 1 application topically 2 (two) times daily.  Dispense: 453 g; Refill: 0 - hydrOXYzine (ATARAX/VISTARIL) 10 MG tablet; Take 1 tablet (10 mg total) by mouth 3 (three) times daily as needed.  Dispense: 30 tablet; Refill: 0  2. Scabies  - permethrin (ELIMITE) 5 % cream; Apply neck down. Leave on overnight and wash off in morning  Dispense: 60 g; Refill: 0 - triamcinolone cream (KENALOG) 0.1 %; Apply 1 application topically 2 (two) times daily.  Dispense: 453 g; Refill: 0 - hydrOXYzine (ATARAX/VISTARIL) 10 MG tablet; Take 1 tablet (10 mg total) by mouth 3 (three) times daily as needed.  Dispense: 30 tablet; Refill: 0     Tiffany A. Chauncey Reading PA-C

## 2015-04-12 ENCOUNTER — Ambulatory Visit: Payer: Self-pay | Admitting: Pharmacist

## 2015-04-12 ENCOUNTER — Other Ambulatory Visit: Payer: Self-pay | Admitting: Family Medicine

## 2015-04-29 ENCOUNTER — Ambulatory Visit (INDEPENDENT_AMBULATORY_CARE_PROVIDER_SITE_OTHER): Payer: Medicare Other | Admitting: Pharmacist

## 2015-04-29 ENCOUNTER — Other Ambulatory Visit: Payer: Self-pay | Admitting: Pharmacist

## 2015-04-29 ENCOUNTER — Encounter: Payer: Self-pay | Admitting: Pharmacist

## 2015-04-29 VITALS — BP 138/80 | HR 68 | Ht 70.0 in | Wt 188.0 lb

## 2015-04-29 DIAGNOSIS — Z87891 Personal history of nicotine dependence: Secondary | ICD-10-CM

## 2015-04-29 DIAGNOSIS — E118 Type 2 diabetes mellitus with unspecified complications: Secondary | ICD-10-CM | POA: Diagnosis not present

## 2015-04-29 DIAGNOSIS — Z794 Long term (current) use of insulin: Secondary | ICD-10-CM | POA: Diagnosis not present

## 2015-04-29 DIAGNOSIS — Z0000 Encounter for general adult medical examination without abnormal findings: Secondary | ICD-10-CM | POA: Diagnosis not present

## 2015-04-29 DIAGNOSIS — E1165 Type 2 diabetes mellitus with hyperglycemia: Secondary | ICD-10-CM

## 2015-04-29 DIAGNOSIS — R809 Proteinuria, unspecified: Secondary | ICD-10-CM

## 2015-04-29 DIAGNOSIS — E1129 Type 2 diabetes mellitus with other diabetic kidney complication: Secondary | ICD-10-CM

## 2015-04-29 DIAGNOSIS — Z1211 Encounter for screening for malignant neoplasm of colon: Secondary | ICD-10-CM

## 2015-04-29 DIAGNOSIS — IMO0002 Reserved for concepts with insufficient information to code with codable children: Secondary | ICD-10-CM

## 2015-04-29 MED ORDER — INSULIN GLARGINE 100 UNIT/ML SOLOSTAR PEN: 38.0000 [IU] | PEN_INJECTOR | Freq: Two times a day (BID) | SUBCUTANEOUS | Status: DC

## 2015-04-29 MED ORDER — GLUCOSE BLOOD VI STRP: ORAL_STRIP | Status: DC

## 2015-04-29 MED ORDER — FREESTYLE SYSTEM KIT: 1.0000 | PACK | Status: DC | PRN

## 2015-04-29 MED ORDER — FREESTYLE LANCETS MISC: Status: DC

## 2015-04-29 NOTE — Progress Notes (Signed)
Patient ID: Jon [name], male   DOB: 1945/12/09, 69 y.o.   MRN: [id number]    Subjective:   Jon [name] is a 69 y.o. male who presents for an Initial Medicare Annual Wellness Visit. Jon [name] is retired from [employer] for which he served 20 years.  He lives in [place] with his wife.   Jon [name] mention the he would like better control of diabetes. He also asks about what clubbed fingers / fingernails indicate.  He was told by a dermatologist 2 years ago that it could indicate cancer.    Patient states that HBG readings have been in 120's in morning and 200's in afternoon / evening. FBG today in office  = 124 mg/dL    Current Medications (verified) Outpatient Encounter Prescriptions as of 04/29/2015  Medication Sig  . aspirin 81 MG tablet Take 81 mg by mouth daily.  . Cholecalciferol (VITAMIN D) 1000 UNITS capsule Take 1 capsule (1,000 Units total) by mouth daily.  [name]. glucose blood (ACCU-CHEK SMARTVIEW) test strip Use to check BG twice a day.  DX:  250.02 uncontrolled type 2 diabetes.  . hydrOXYzine (ATARAX/VISTARIL) 10 MG tablet Take 1 tablet (10 mg total) by mouth 3 (three) times daily as needed.  . Insulin Glargine (LANTUS SOLOSTAR) 100 UNIT/ML Solostar Pen Inject 38 Units into the skin 2 (two) times daily.  [name]. lisinopril (PRINIVIL,ZESTRIL) 2.5 MG tablet Take 1 tablet (2.5 mg total) by mouth daily.  . metFORMIN (GLUCOPHAGE) 1000 MG tablet Take 1 tablet (1,000 mg total) by mouth 2 (two) times daily with a meal.  . saxagliptin HCl (ONGLYZA) 5 MG TABS tablet Take 1 tablet (5 mg total) by mouth daily.  . SURE COMFORT PEN NEEDLES 32G X 6 MM MISC USE WITH LANTUS PEN DAILY  . triamcinolone cream (KENALOG) 0.1 % Apply 1 application topically 2 (two) times daily.  . [DISCONTINUED] Insulin Glargine (LANTUS SOLOSTAR) 100 UNIT/ML Solostar Pen Inject 75 Units into the skin every morning.  . [DISCONTINUED] permethrin (ELIMITE) 5 % cream Apply neck down. Leave on overnight and wash off in  morning   No facility-administered encounter medications on file as of 04/29/2015.    Allergies (verified) Review of patient's allergies indicates no known allergies.   History: Past Medical History  Diagnosis Date  . Diabetes mellitus without complication (HCC)   . AAA (abdominal aortic aneurysm) (HCC)   . Hypertension   . Anemia   . History of gallstones   . Vitamin D deficiency   . Hyperlipidemia   . Ataxia    Past Surgical History  Procedure Laterality Date  . Ercp    . Inguinal hernia repair    . Tonsillectomy     Family History  Problem Relation Age of Onset  . Diabetes Mother   . Hip fracture Mother   . Pulmonary embolism Mother     after hip fracture  . Lupus Sister   . Scleroderma Sister   . Diabetes Brother   . Hyperlipidemia Brother   . Hypertension Brother    Social History   Occupational History  . Not on file.   Social History Main Topics  . Smoking status: Former Smoker -- 1.00 packs/day for 40 years    Types: Cigarettes    Start date: 07/03/1966    Quit date: 02/06/2009  . Smokeless tobacco: Never Used  . Alcohol Use: No  . Drug Use: No  . Sexual Activity: Yes   Patient has a 30 pack year  history of smoking  Do you feel safe at home?  Yes  Dietary issues and exercise activities discussed: Current Exercise Habits:: Home exercise routine, Type of exercise: walking (1/2 mile), Time (Minutes): 15, Frequency (Times/Week): 6, Weekly Exercise (Minutes/Week): 90, Intensity: Mild  Current Dietary habits:  Patient has stopped any sugar contain drinks.  He tries to limit sugar containing foods and high CHO foods.    Cardiac Risk Factors include: advanced age (>9men, >7 women);diabetes mellitus;dyslipidemia;hypertension;male gender;microalbuminuria  Objective:    Today's Vitals   04/29/15 1044  BP: 138/80  Pulse: 68  Height:  (1.778 m)  Weight: 188 lb (85.276 kg)  PainSc: 2    Body mass index is 26.98 kg/(m^2).   Activities of  Daily Living In your present state of health, do you have any difficulty performing the following activities: 04/29/2015  Hearing? Y  Vision? N  Difficulty concentrating or making decisions? N  Walking or climbing stairs? Y  Dressing or bathing? N  Doing errands, shopping? N  Preparing Food and eating ? N  Using the Toilet? N  In the past six months, have you accidently leaked urine? N  Do you have problems with loss of bowel control? N  Managing your Medications? N  Managing your Finances? N  Housekeeping or managing your Housekeeping? N    Are there smokers in your home (other than you)? No    Depression Screen PHQ 2/9 Scores 04/29/2015 03/09/2015 06/24/2014 02/06/2014  PHQ - 2 Score 0 0 0 0    Fall Risk Fall Risk  04/29/2015 03/09/2015 06/24/2014 02/06/2014 02/02/2014  Falls in the past year? No No No No No    Cognitive Function: MMSE - Mini Mental State Exam 04/29/2015  Orientation to time 5  Orientation to Place 5  Registration 3  Attention/ Calculation 4  Recall 3  Language- name 2 objects 2  Language- repeat 1  Language- follow 3 step command 3  Language- read & follow direction 1  Write a sentence 1  Copy design 0  Total score 28    Immunizations and Health Maintenance  There is no immunization history on file for this patient. Health Maintenance Due  Topic Date Due  . Hepatitis C Screening  [birth year]  . COLONOSCOPY  12/19/1997  . PNA vac Low Risk Adult (1 of 2 - PCV13) 12/19/2012    Patient Care Team: Frederica Kuster, MD as PCP - General (Family Medicine) Everlena Cooper as Consulting Physician (Neurology)   [facility]oyt Koch - Sports Medicine [place], [place])  Indicate any recent Medical Services you may have received from other than [facility] providers in the past year (date may be approximate).    Assessment:    Annual Wellness Visit  Type 2 DM uncontrolled with long term insulin use Clubbing of fingers History of smoking with greater than 30 pack  years  Screening Tests Health Maintenance  Topic Date Due  . Hepatitis C Screening  Mar 14, 1946  . COLONOSCOPY  12/19/1997  . PNA vac Low Risk Adult (1 of 2 - PCV13) 12/19/2012  . ZOSTAVAX  05/09/2015 (Originally 12/20/2007)  . TETANUS/TDAP  05/09/2015 (Originally 12/20/1966)  . INFLUENZA VACCINE  06/25/2015 (Originally 02/01/2015)  . HEMOGLOBIN A1C  09/09/2015  . FOOT EXAM  09/24/2015  . URINE MICROALBUMIN  09/24/2015  . OPHTHALMOLOGY EXAM  01/15/2016        Plan:   During the course of the visit Jon [name] was educated and counseled about the following appropriate screening and preventive services:  Vaccines to include Pneumoccal, Influenza, Hepatitis B, Td, Zostavax - patient declined all vaccinations  Colorectal cancer screening - refused colonoscopy but did take FOBT home to complete  Cardiovascular disease screening - Lipids at goal.  BP at goal today  CT scan for lung cancer screening due to past smoking history  Diabetes - A1c and evening BG not at goal ; FBG at goal - due to recheck A1c 06/2015. Lanuts changes to 38 units BID continue metformin   Glaucoma screening / Diabetic Eye Exam - UTD  Nutrition counseling - Reviewed CHO counting  Prostate cancer screening - will get PSA with next labs  Advanced Directives - UTD  Continue to walking daily  RTC in 6 weeks to follow up type 2 DM  Orders Placed This Encounter  Procedures  . Fecal occult blood, imunochemical    Standing Status: Future     Number of Occurrences:      Standing Expiration Date: 07/02/2015  . CT CHEST LUNG CA SCREEN LOW DOSE W/O CM    Scheduling Instructions:     Patient prefers Kathryne Sharper - first  or Jeani Hawking - second    Order Specific Question:  Reason for Exam (SYMPTOM  OR DIAGNOSIS REQUIRED)    Answer:  30 pack year of cigarette smoking - stopped in 2010  . Microalbumin / creatinine urine ratio      Goals    None       Patient Instructions (the written plan) were given to the  patient.   Henrene Pastor, [facility]   04/29/2015

## 2015-04-29 NOTE — Patient Instructions (Addendum)
[name] , Thank you for taking time to come for your Medicare Wellness Visit. I appreciate your ongoing commitment to your health goals. Please review the following plan we discussed and let me know if I can assist you in the future.   These are the goals we discussed:  Continue with regular exercise Change Lantus to 38 units twice a day We are sending referral for Chest CT   This is a list of the screening recommended for you and due dates:  Health Maintenance  Topic Date Due  .  Hepatitis C: One time screening is recommended by Center for Disease Control  (CDC) for  adults born from [birth year] through 1963.   Dec 07, 1945  . Colon Cancer Screening  12/19/1997  . Pneumonia vaccines (1 of 2 - PCV13) 12/19/2012  . Shingles Vaccine  05/09/2015*  . Tetanus Vaccine  05/09/2015*  . Flu Shot  06/25/2015*  . Hemoglobin A1C  09/09/2015  . Complete foot exam   09/24/2015  . Urine Protein Check  09/24/2015  . Eye exam for diabetics  01/15/2016  *Topic was postponed. The date shown is not the original due date.    Managing Your High Blood Pressure Blood pressure is a measurement of how forceful your blood is pressing against the walls of the arteries. Arteries are muscular tubes within the circulatory system. Blood pressure does not stay the same. Blood pressure rises when you are active, excited, or nervous; and it lowers during sleep and relaxation. If the numbers measuring your blood pressure stay above normal most of the time, you are at risk for health problems. High blood pressure (hypertension) is a long-term (chronic) condition in which blood pressure is elevated. A blood pressure reading is recorded as two numbers, such as 120 over 80 (or 120/80). The first, higher number is called the systolic pressure. It is a measure of the pressure in your arteries as the heart beats. The second, lower number is called the diastolic pressure. It is a measure of the pressure in your arteries as the heart  relaxes between beats.  Keeping your blood pressure in a normal range is important to your overall health and prevention of health problems, such as heart disease and stroke. When your blood pressure is uncontrolled, your heart has to work harder than normal. High blood pressure is a very common condition in adults because blood pressure tends to rise with age. Men and women are equally likely to have hypertension but at different times in life. Before age 67, men are more likely to have hypertension. After 67 years of age, women are more likely to have it. Hypertension is especially common in African Americans. This condition often has no signs or symptoms. The cause of the condition is usually not known. Your caregiver can help you come up with a plan to keep your blood pressure in a normal, healthy range. BLOOD PRESSURE STAGES Blood pressure is classified into four stages: normal, prehypertension, stage 1, and stage 2. Your blood pressure reading will be used to determine what type of treatment, if any, is necessary. Appropriate treatment options are tied to these four stages:  Normal  Systolic pressure (mm Hg): below 120.  Diastolic pressure (mm Hg): below 80. Prehypertension  Systolic pressure (mm Hg): 120 to 139.  Diastolic pressure (mm Hg): 80 to 89. Stage1  Systolic pressure (mm Hg): 140 to 159.  Diastolic pressure (mm Hg): 90 to 99. Stage2  Systolic pressure (mm Hg): 160 or above.  Diastolic  pressure (mm Hg): 100 or above. RISKS RELATED TO HIGH BLOOD PRESSURE Managing your blood pressure is an important responsibility. Uncontrolled high blood pressure can lead to:  A heart attack.  A stroke.  A weakened blood vessel (aneurysm).  Heart failure.  Kidney damage.  Eye damage.  Metabolic syndrome.  Memory and concentration problems. HOW TO MANAGE YOUR BLOOD PRESSURE Blood pressure can be managed effectively with lifestyle changes and medicines (if needed). Your  caregiver will help you come up with a plan to bring your blood pressure within a normal range. Your plan should include the following: Education  Read all information provided by your caregivers about how to control blood pressure.  Educate yourself on the latest guidelines and treatment recommendations. New research is always being done to further define the risks and treatments for high blood pressure. Lifestylechanges  Control your weight.  Avoid smoking.  Stay physically active.  Reduce the amount of salt in your diet.  Reduce stress.  Control any chronic conditions, such as high cholesterol or diabetes.  Reduce your alcohol intake. Medicines  Several medicines (antihypertensive medicines) are available, if needed, to bring blood pressure within a normal range. Communication  Review all the medicines you take with your caregiver because there may be side effects or interactions.  Talk with your caregiver about your diet, exercise habits, and other lifestyle factors that may be contributing to high blood pressure.  See your caregiver regularly. Your caregiver can help you create and adjust your plan for managing high blood pressure. RECOMMENDATIONS FOR TREATMENT AND FOLLOW-UP  The following recommendations are based on current guidelines for managing high blood pressure in nonpregnant adults. Use these recommendations to identify the proper follow-up period or treatment option based on your blood pressure reading. You can discuss these options with your caregiver.  Systolic pressure of 120 to 139 or diastolic pressure of 80 to 89: Follow up with your caregiver as directed.  Systolic pressure of 140 to 160 or diastolic pressure of 90 to 100: Follow up with your caregiver within 2 months.  Systolic pressure above 160 or diastolic pressure above 100: Follow up with your caregiver within 1 month.  Systolic pressure above 180 or diastolic pressure above 110: Consider  antihypertensive therapy; follow up with your caregiver within 1 week.  Systolic pressure above 200 or diastolic pressure above 120: Begin antihypertensive therapy; follow up with your caregiver within 1 week.   This information is not intended to replace advice given to you by your health care provider. Make sure you discuss any questions you have with your health care provider.   Document Released: 03/13/2012 Document Reviewed: 03/13/2012 Elsevier Interactive Patient Education [employer]

## 2015-04-30 ENCOUNTER — Other Ambulatory Visit: Payer: Self-pay | Admitting: Pharmacist

## 2015-04-30 LAB — MICROALBUMIN / CREATININE URINE RATIO
CREATININE, UR: 47.9 mg/dL
MICROALB/CREAT RATIO: 83.5 mg/g{creat} — AB (ref 0.0–30.0)
MICROALBUM., U, RANDOM: 40 ug/mL

## 2015-04-30 MED ORDER — LISINOPRIL 5 MG PO TABS: 5.0000 mg | ORAL_TABLET | Freq: Every day | ORAL | Status: DC

## 2015-05-05 ENCOUNTER — Other Ambulatory Visit: Payer: Self-pay | Admitting: Pharmacist

## 2015-05-05 NOTE — Telephone Encounter (Signed)
Error

## 2015-05-06 ENCOUNTER — Telehealth: Payer: Self-pay | Admitting: Pharmacist

## 2015-05-06 ENCOUNTER — Ambulatory Visit (HOSPITAL_COMMUNITY)
Admission: RE | Admit: 2015-05-06 | Discharge: 2015-05-06 | Disposition: A | Discharge status: Home / Self Care | Payer: Medicare Other | Source: Ambulatory Visit | Attending: Pharmacist | Admitting: Pharmacist

## 2015-05-06 DIAGNOSIS — Z87891 Personal history of nicotine dependence: Secondary | ICD-10-CM | POA: Diagnosis not present

## 2015-05-06 DIAGNOSIS — Z122 Encounter for screening for malignant neoplasm of respiratory organs: Secondary | ICD-10-CM | POA: Diagnosis not present

## 2015-05-06 DIAGNOSIS — I2510 Atherosclerotic heart disease of native coronary artery without angina pectoris: Secondary | ICD-10-CM | POA: Insufficient documentation

## 2015-05-06 NOTE — Telephone Encounter (Signed)
Patient's call returned.  Discussed ways to decrease protein in urine such as better BG and BP control and how to improved both of those with diet.

## 2015-05-10 NOTE — Telephone Encounter (Signed)
Discussed how ACE-I work to protect kidney function in diabetics.  Also discussed limiting salt in diet.

## 2015-05-19 ENCOUNTER — Other Ambulatory Visit: Payer: Self-pay | Admitting: Pharmacist

## 2015-05-19 DIAGNOSIS — Z87891 Personal history of nicotine dependence: Secondary | ICD-10-CM

## 2015-05-25 ENCOUNTER — Ambulatory Visit (HOSPITAL_COMMUNITY): Admission: RE | Admit: 2015-05-25 | Payer: Medicare Other | Source: Ambulatory Visit

## 2015-05-30 ENCOUNTER — Other Ambulatory Visit: Payer: Self-pay | Admitting: Family Medicine

## 2015-06-10 DIAGNOSIS — R413 Other amnesia: Secondary | ICD-10-CM | POA: Diagnosis not present

## 2015-06-14 ENCOUNTER — Other Ambulatory Visit: Payer: Self-pay

## 2015-06-18 DIAGNOSIS — G3184 Mild cognitive impairment, so stated: Secondary | ICD-10-CM | POA: Diagnosis not present

## 2015-06-25 ENCOUNTER — Telehealth: Payer: Self-pay | Admitting: Pharmacist

## 2015-06-25 NOTE — Telephone Encounter (Signed)
Patient's wife is very concerned about his memory and gait.  She states that by the end of the day he is dragging his foot and this has gotten worse of the last few month.  He has seen an orthopedist / sports medicine physician in [place] but she could not remember his name who only determined that [name] has an extra bone in his foot.  He also saw Dr Maury DusBey as neurologist in 2014 about his gait.  She is also concern because his memory appears to be worsening.  Last night when they got into bed she found an axe. He said he has been looking at it earlier and has forgotten that he laid it on the bed.  [name] saw a neurologist 09/2014 Dr Logan BoresEvans about memory deficit.   I told Jon [name] that I would review his medications to see if there was anything that might be affecting his gait or memory.  I will also request and review notes from the above specialist and discuss her concerns with Dr Hyacinth MeekerMiller.  Patient has an appt with Dr Hyacinth MeekerMiller 07/14/2015.

## 2015-07-06 ENCOUNTER — Other Ambulatory Visit: Payer: Self-pay | Admitting: Family Medicine

## 2015-07-08 ENCOUNTER — Telehealth: Payer: Self-pay | Admitting: Pharmacist

## 2015-07-08 NOTE — Telephone Encounter (Signed)
Researched patient's medication list.   There is a small incidence (less than 1%) of confusion and memory impairment reported with ACE-I / lisinopril. Also Onglyza and DPP4 inhibitors have been associated with arthralgias.   Patient is instructed to hold lisinopril and onglyza for the next 2 to 4 weeks to see if symptoms of memory changes / loss and lower extremity pain/ weakness improve.   He is to call for insulin adjustment if he gets BG greater than 200 on more than 1 occasion.  Patient also already has appt to follow up with PCP 07/14/2015.

## 2015-07-08 NOTE — Addendum Note (Signed)
Addended by: Henrene PastorECKARD, Millissa Deese on: 07/08/2015 03:37 PM   Modules accepted: Orders, Medications

## 2015-07-14 ENCOUNTER — Other Ambulatory Visit: Payer: Self-pay | Admitting: *Deleted

## 2015-07-14 ENCOUNTER — Encounter: Payer: Self-pay | Admitting: Family Medicine

## 2015-07-14 ENCOUNTER — Ambulatory Visit: Payer: Medicare Other | Admitting: Family Medicine

## 2015-07-14 ENCOUNTER — Ambulatory Visit (INDEPENDENT_AMBULATORY_CARE_PROVIDER_SITE_OTHER): Payer: Medicare Other | Admitting: Family Medicine

## 2015-07-14 DIAGNOSIS — E785 Hyperlipidemia, unspecified: Secondary | ICD-10-CM

## 2015-07-14 DIAGNOSIS — Z794 Long term (current) use of insulin: Principal | ICD-10-CM

## 2015-07-14 DIAGNOSIS — E1129 Type 2 diabetes mellitus with other diabetic kidney complication: Secondary | ICD-10-CM | POA: Diagnosis not present

## 2015-07-14 DIAGNOSIS — I10 Essential (primary) hypertension: Secondary | ICD-10-CM | POA: Diagnosis not present

## 2015-07-14 DIAGNOSIS — R809 Proteinuria, unspecified: Principal | ICD-10-CM

## 2015-07-14 LAB — POCT GLYCOSYLATED HEMOGLOBIN (HGB A1C): Hemoglobin A1C: 7.4

## 2015-07-14 MED ORDER — INSULIN PEN NEEDLE 32G X 6 MM MISC: Status: DC

## 2015-07-14 MED ORDER — INSULIN GLARGINE 100 UNIT/ML SOLOSTAR PEN: 42.0000 [IU] | PEN_INJECTOR | Freq: Two times a day (BID) | SUBCUTANEOUS | Status: DC

## 2015-07-14 NOTE — Progress Notes (Signed)
Subjective:    Patient ID: Jon [name], male    DOB: [birth year], 70 y.o.   MRN: [id number]  HPI 70 year old male who is here to follow-up diabetes. Currently takes Lantus 80 units and metformin. Last A1c in September was 8.4. He had also been on Onglyza but that was stopped due to some vague joint symptoms. Regarding the joint symptoms is seen the sports medicine doctor in [place] who told him that he did not have any degenerative arthritis. There have been concerns in the past about cognitive impairment but he scored 30 on mental status exam here and was told by neurologist in [facility] there was no issues. He has had no incontinence. He has had one episode of dizziness but no real gait instability although he cannot do tandem walking.  Patient Active Problem List   Diagnosis Date Noted  . Microalbuminuria due to type 2 diabetes mellitus ([place]) 03/12/2015  . Mild cognitive disorder 05/15/2013  . Vitamin D deficiency   . Hyperlipidemia 03/19/2013  . Uncontrolled type 2 diabetes mellitus with complication, with long-term current use of insulin ([place]) 02/06/2013  . HTN (hypertension) 02/06/2013  . AAA (abdominal aortic aneurysm) without rupture ([place]) 02/06/2013  . Clubbing of fingers 02/06/2013  . Ex-smoker 02/06/2013  . History of unsteady gait 02/06/2013   Outpatient Encounter Prescriptions as of 07/14/2015  Medication Sig  . aspirin 81 MG tablet Take 81 mg by mouth daily.  . Cholecalciferol (VITAMIN D) 1000 UNITS capsule Take 1 capsule (1,000 Units total) by mouth daily.  [name] glucose blood (FREESTYLE LITE) test strip Use to check BG up to twice daily (Dx: E11.8, E11.65, Z79.4 - type 2 DM with complications and long term insulin use)  . glucose monitoring kit (FREESTYLE) monitoring kit 1 each by Does not apply route as needed for other. Use to check BG up to twice daily (Dx: E11.8, E11.65, Z79.4 - type 2 DM with complications and long term insulin use)  . Insulin Glargine (LANTUS  SOLOSTAR) 100 UNIT/ML Solostar Pen Inject 38 Units into the skin 2 (two) times daily.  . Lancets (FREESTYLE) lancets Use to check BG up to twice daily (Dx: E11.8, E11.65, Z79.4 - type 2 DM with complications and long term insulin use)  . metFORMIN (GLUCOPHAGE) 1000 MG tablet TAKE 1 TABLET TWICE A DAY WITH MEALS  . SURE COMFORT PEN NEEDLES 32G X 6 MM MISC USE WITH LANTUS PEN DAILY  . hydrOXYzine (ATARAX/VISTARIL) 10 MG tablet Take 1 tablet (10 mg total) by mouth 3 (three) times daily as needed. (Patient not taking: Reported on 07/14/2015)  . triamcinolone cream (KENALOG) 0.1 % Apply 1 application topically 2 (two) times daily. (Patient not taking: Reported on 07/14/2015)   No facility-administered encounter medications on file as of 07/14/2015.      Review of Systems  Constitutional: Negative.   HENT: Negative.   Respiratory: Negative.   Cardiovascular: Negative.   Musculoskeletal: Positive for arthralgias.  Psychiatric/Behavioral: Negative.        Objective:   Physical Exam  Constitutional: He is oriented to person, place, and time. He appears well-developed and well-nourished.  Cardiovascular: Normal rate and regular rhythm.   Pulmonary/Chest: Effort normal and breath sounds normal.  Neurological: He is alert and oriented to person, place, and time.          Assessment & Plan:  1. Type 2 diabetes mellitus with microalbuminuria, with long-term current use of insulin (HCC) A1c today is down from 8.4-7.4. I would recommend continuation of  Lantus insulin along with metformin. - CMP14+EGFR - Lipid panel - POCT glycosylated hemoglobin (Hb A1C)  2. Hyperlipidemia Last lipid panel was done in August 2015 with LDL at goal of 93. Will repeat today - CMP14+EGFR - Lipid panel - POCT glycosylated hemoglobin (Hb A1C)  3. Essential hypertension Blood pressures have not been an issue in this man. He was started on lisinopril for renal protection solely but that was stopped recently due  to the possibility of causing some joint symptoms with Onglyza - CMP14+EGFR - Lipid panel   - POCT glycosylated hemoglobin (Hb A1C)  Wardell Honour MD

## 2015-07-15 LAB — CMP14+EGFR
ALBUMIN: 4.1 g/dL (ref 3.6–4.8)
ALK PHOS: 94 IU/L (ref 39–117)
ALT: 32 IU/L (ref 0–44)
AST: 24 IU/L (ref 0–40)
Albumin/Globulin Ratio: 1.4 (ref 1.1–2.5)
BILIRUBIN TOTAL: 0.3 mg/dL (ref 0.0–1.2)
BUN / CREAT RATIO: 13 (ref 10–22)
BUN: 12 mg/dL (ref 8–27)
CHLORIDE: 100 mmol/L (ref 96–106)
CO2: 22 mmol/L (ref 18–29)
CREATININE: 0.89 mg/dL (ref 0.76–1.27)
Calcium: 9.1 mg/dL (ref 8.6–10.2)
GFR calc Af Amer: 102 mL/min/{1.73_m2} (ref >59)
GFR calc non Af Amer: 88 mL/min/{1.73_m2} (ref >59)
GLUCOSE: 245 mg/dL — AB (ref 65–99)
Globulin, Total: 3 g/dL (ref 1.5–4.5)
Potassium: 4.3 mmol/L (ref 3.5–5.2)
SODIUM: 139 mmol/L (ref 134–144)
Total Protein: 7.1 g/dL (ref 6.0–8.5)

## 2015-07-15 LAB — LIPID PANEL
CHOLESTEROL TOTAL: 173 mg/dL (ref 100–199)
Chol/HDL Ratio: 3.5 ratio units (ref 0.0–5.0)
HDL: 50 mg/dL (ref >39)
LDL Calculated: 86 mg/dL (ref 0–99)
TRIGLYCERIDES: 185 mg/dL — AB (ref 0–149)
VLDL CHOLESTEROL CAL: 37 mg/dL (ref 5–40)

## 2015-07-19 ENCOUNTER — Encounter: Payer: Self-pay | Admitting: *Deleted

## 2015-08-09 ENCOUNTER — Telehealth: Payer: Self-pay | Admitting: *Deleted

## 2015-08-09 NOTE — Telephone Encounter (Signed)
Patient was given an FOBT 04/2015 that hasn't been returned. Called to follow up. No answer and voicemail has not been setup. Will attempt again at a later time.

## 2015-08-13 ENCOUNTER — Encounter: Payer: Self-pay | Admitting: Family Medicine

## 2015-08-27 ENCOUNTER — Other Ambulatory Visit: Payer: Self-pay | Admitting: Family Medicine

## 2015-09-03 DIAGNOSIS — F0280 Dementia in other diseases classified elsewhere without behavioral disturbance: Secondary | ICD-10-CM | POA: Diagnosis not present

## 2015-09-03 DIAGNOSIS — G3109 Other frontotemporal dementia: Secondary | ICD-10-CM | POA: Diagnosis not present

## 2015-09-20 DIAGNOSIS — G3109 Other frontotemporal dementia: Secondary | ICD-10-CM | POA: Diagnosis not present

## 2015-09-20 DIAGNOSIS — I6782 Cerebral ischemia: Secondary | ICD-10-CM | POA: Diagnosis not present

## 2015-09-20 DIAGNOSIS — F0280 Dementia in other diseases classified elsewhere without behavioral disturbance: Secondary | ICD-10-CM | POA: Diagnosis not present

## 2015-09-20 DIAGNOSIS — R9089 Other abnormal findings on diagnostic imaging of central nervous system: Secondary | ICD-10-CM | POA: Diagnosis not present

## 2015-09-28 ENCOUNTER — Encounter: Payer: Self-pay | Admitting: Family Medicine

## 2015-09-28 ENCOUNTER — Ambulatory Visit (INDEPENDENT_AMBULATORY_CARE_PROVIDER_SITE_OTHER): Payer: Medicare Other | Admitting: Family Medicine

## 2015-09-28 VITALS — BP 126/81 | HR 82 | Temp 97.4°F | Ht 70.0 in | Wt 190.6 lb

## 2015-09-28 DIAGNOSIS — J439 Emphysema, unspecified: Secondary | ICD-10-CM

## 2015-09-28 DIAGNOSIS — R05 Cough: Secondary | ICD-10-CM | POA: Diagnosis not present

## 2015-09-28 DIAGNOSIS — J209 Acute bronchitis, unspecified: Secondary | ICD-10-CM

## 2015-09-28 DIAGNOSIS — R059 Cough, unspecified: Secondary | ICD-10-CM

## 2015-09-28 MED ORDER — OMEPRAZOLE 20 MG PO CPDR: 20.0000 mg | DELAYED_RELEASE_CAPSULE | Freq: Every day | ORAL | Status: DC

## 2015-09-28 MED ORDER — AZITHROMYCIN 250 MG PO TABS: ORAL_TABLET | ORAL | Status: DC

## 2015-09-28 MED ORDER — BENZONATATE 200 MG PO CAPS: 200.0000 mg | ORAL_CAPSULE | Freq: Two times a day (BID) | ORAL | Status: DC | PRN

## 2015-09-28 NOTE — Progress Notes (Addendum)
   HPI  Patient presents today here with cough.  Patient and his wife explained that he's had cough and nasal congestion over the last 2 days. He denies fever, chills, body aches, and sick contacts. He denies any chest pain. He does have a history of bullous emphysema.  He like a refill on omeprazole, he states that he's been on this for a while for heartburn he only has symptoms whenever he eats certain foods. He has triedH2 blockers with some improvement  PMH: Smoking status noted ROS: Per HPI  Objective: BP 126/81 mmHg  Pulse 82  Temp(Src) 97.4 F (36.3 C) (Oral)  Ht 5\' 10"  (1.778 m)  Wt 190 lb 9.6 oz (86.456 kg)  BMI 27.35 kg/m2 Gen: NAD, alert, cooperative with exam HEENT: NCAT, ares clear, oropharynx clear, TMs normal bilaterally CV: RRR, good S1/S2, no murmur Resp: CTABL, no wheezes, non-labored Ext: No edema, warm Neuro: Alert and oriented, No gross deficits  Assessment and plan:  # bronchitis Considering history of severe bullous disease noted on a CAT scan from  2001 I'm treating a little bit more aggressively than usual Azithromycin No need for steroids, caution of those consider diabetes Return to clinic with any concerns, worsening symptoms, or failure to improve as expected    Meds ordered this encounter  Medications  . azithromycin (ZITHROMAX) 250 MG tablet    Sig: Take 2 tablets on day 1 and 1 tablet daily after that    Dispense:  6 tablet    Refill:  0  . omeprazole (PRILOSEC) 20 MG capsule    Sig: Take 1 capsule (20 mg total) by mouth daily.    Dispense:  30 capsule    Refill:  3    Murtis SinkSam Kemiah Booz, MD [facility] 09/28/2015, 5:46 PM

## 2015-09-28 NOTE — Patient Instructions (Signed)
Great to meet you!  Come back if you have any concerns  Azithromycin is an antibiotic, finish all of the pills  Acute Bronchitis Bronchitis is when the airways that extend from the windpipe into the lungs get red, puffy, and painful (inflamed). Bronchitis often causes thick spit (mucus) to develop. This leads to a cough. A cough is the most common symptom of bronchitis. In acute bronchitis, the condition usually begins suddenly and goes away over time (usually in 2 weeks). Smoking, allergies, and asthma can make bronchitis worse. Repeated episodes of bronchitis may cause more lung problems. HOME CARE  Rest.  Drink enough fluids to keep your pee (urine) clear or pale yellow (unless you need to limit fluids as told by your doctor).  Only take over-the-counter or prescription medicines as told by your doctor.  Avoid smoking and secondhand smoke. These can make bronchitis worse. If you are a smoker, think about using nicotine gum or skin patches. Quitting smoking will help your lungs heal faster.  Reduce the chance of getting bronchitis again by:  Washing your hands often.  Avoiding people with cold symptoms.  Trying not to touch your hands to your mouth, nose, or eyes.  Follow up with your doctor as told. GET HELP IF: Your symptoms do not improve after 1 week of treatment. Symptoms include:  Cough.  Fever.  Coughing up thick spit.  Body aches.  Chest congestion.  Chills.  Shortness of breath.  Sore throat. GET HELP RIGHT AWAY IF:   You have an increased fever.  You have chills.  You have severe shortness of breath.  You have bloody thick spit (sputum).  You throw up (vomit) often.  You lose too much body fluid (dehydration).  You have a severe headache.  You faint. MAKE SURE YOU:   Understand these instructions.  Will watch your condition.  Will get help right away if you are not doing well or get worse.   This information is not intended to replace  advice given to you by your health care provider. Make sure you discuss any questions you have with your health care provider.   Document Released: 12/06/2007 Document Revised: 02/19/2013 Document Reviewed: 12/10/2012 Elsevier Interactive Patient Education [employer]2016 Elsevier Inc.

## 2015-10-02 ENCOUNTER — Other Ambulatory Visit: Payer: Self-pay | Admitting: Family Medicine

## 2015-11-02 ENCOUNTER — Ambulatory Visit (INDEPENDENT_AMBULATORY_CARE_PROVIDER_SITE_OTHER): Payer: Medicare Other | Admitting: Family Medicine

## 2015-11-02 ENCOUNTER — Encounter: Payer: Self-pay | Admitting: Family Medicine

## 2015-11-02 VITALS — BP 119/81 | HR 77 | Temp 97.1°F | Ht 70.0 in | Wt 192.6 lb

## 2015-11-02 DIAGNOSIS — E1129 Type 2 diabetes mellitus with other diabetic kidney complication: Secondary | ICD-10-CM | POA: Diagnosis not present

## 2015-11-02 DIAGNOSIS — Z794 Long term (current) use of insulin: Secondary | ICD-10-CM | POA: Diagnosis not present

## 2015-11-02 DIAGNOSIS — R6889 Other general symptoms and signs: Secondary | ICD-10-CM | POA: Diagnosis not present

## 2015-11-02 DIAGNOSIS — R809 Proteinuria, unspecified: Secondary | ICD-10-CM | POA: Diagnosis not present

## 2015-11-02 DIAGNOSIS — E785 Hyperlipidemia, unspecified: Secondary | ICD-10-CM

## 2015-11-02 DIAGNOSIS — I10 Essential (primary) hypertension: Secondary | ICD-10-CM

## 2015-11-02 LAB — BAYER DCA HB A1C WAIVED: HB A1C (BAYER DCA - WAIVED): 8.7 % — ABNORMAL HIGH (ref <7.0)

## 2015-11-02 MED ORDER — OMEPRAZOLE 20 MG PO CPDR: 20.0000 mg | DELAYED_RELEASE_CAPSULE | Freq: Every day | ORAL | Status: DC

## 2015-11-02 MED ORDER — INSULIN GLARGINE 100 UNIT/ML SOLOSTAR PEN: 42.0000 [IU] | PEN_INJECTOR | Freq: Two times a day (BID) | SUBCUTANEOUS | Status: DC

## 2015-11-02 MED ORDER — METFORMIN HCL 1000 MG PO TABS: 1000.0000 mg | ORAL_TABLET | Freq: Two times a day (BID) | ORAL | Status: DC

## 2015-11-02 NOTE — Progress Notes (Signed)
Subjective:    Patient ID: Jon [name], male    DOB: 1946/02/20, 70 y.o.   MRN: [id number]  HPI 70 year old gentleman with type 2 diabetes, unsteady gait (but not related to neuropathy per neurology) bullous emphysema. Last A1c 3 months ago was 7.4. Medicines include glargine insulin and metformin. His lifestyle is not very active partly because his inability to ambulate without the cane. According to girlfriend who accompanies him today most of the time is spent sitting in front of his computer. He complains of some loss of hearing in his right ear. He had formally been on lisinopril, not for blood pressure but for renal protection but apparently that was stopped for unclear reasons  Patient Active Problem List   Diagnosis Date Noted  . Bullous emphysema ([place]) 09/28/2015  . Microalbuminuria due to type 2 diabetes mellitus ([place]) 03/12/2015  . Mild cognitive disorder 05/15/2013  . Vitamin D deficiency   . Hyperlipidemia 03/19/2013  . Uncontrolled type 2 diabetes mellitus with complication, with long-term current use of insulin ([place]) 02/06/2013  . HTN (hypertension) 02/06/2013  . AAA (abdominal aortic aneurysm) without rupture ([place]) 02/06/2013  . Clubbing of fingers 02/06/2013  . Ex-smoker 02/06/2013  . History of unsteady gait 02/06/2013   Outpatient Encounter Prescriptions as of 11/02/2015  Medication Sig  . aspirin 81 MG tablet Take 81 mg by mouth daily.  . Cholecalciferol (VITAMIN D) 1000 UNITS capsule Take 1 capsule (1,000 Units total) by mouth daily.  [name] glucose blood (FREESTYLE LITE) test strip Use to check BG up to twice daily (Dx: E11.8, E11.65, Z79.4 - type 2 DM with complications and long term insulin use)  . glucose monitoring kit (FREESTYLE) monitoring kit 1 each by Does not apply route as needed for other. Use to check BG up to twice daily (Dx: E11.8, E11.65, Z79.4 - type 2 DM with complications and long term insulin use)  . Insulin Glargine (LANTUS SOLOSTAR) 100 UNIT/ML  Solostar Pen Inject 42 Units into the skin 2 (two) times daily.  . Insulin Pen Needle (SURE COMFORT PEN NEEDLES) 32G X 6 MM MISC Use with insulin pen twice daily  . Lancets (FREESTYLE) lancets Use to check BG up to twice daily (Dx: E11.8, E11.65, Z79.4 - type 2 DM with complications and long term insulin use)  . metFORMIN (GLUCOPHAGE) 1000 MG tablet TAKE 1 TABLET TWICE A DAY WITH MEALS  . omeprazole (PRILOSEC) 20 MG capsule Take 1 capsule (20 mg total) by mouth daily.  . [DISCONTINUED] azithromycin (ZITHROMAX) 250 MG tablet Take 2 tablets on day 1 and 1 tablet daily after that  . [DISCONTINUED] benzonatate (TESSALON) 200 MG capsule Take 1 capsule (200 mg total) by mouth 2 (two) times daily as needed for cough.   No facility-administered encounter medications on file as of 11/02/2015.      Review of Systems  Constitutional: Negative.   HENT: Positive for hearing loss.   Respiratory: Negative.   Cardiovascular: Negative.   Gastrointestinal: Negative.   Genitourinary: Negative.   Neurological: Negative.   Psychiatric/Behavioral: Negative.        Objective:   Physical Exam  Constitutional: He is oriented to person, place, and time. He appears well-developed and well-nourished.  HENT:  Right Ear: External ear normal.  Right EAC occluded by cerumen  Cardiovascular: Normal rate, regular rhythm and normal heart sounds.   Pulmonary/Chest: Effort normal and breath sounds normal.  Neurological: He is alert and oriented to person, place, and time.  Psychiatric: He has a  normal mood and affect. His behavior is normal.          Assessment & Plan:  1. Type 2 diabetes mellitus with microalbuminuria, with long-term current use of insulin (HCC) Expect A1c to be about the same based on his reporting of sugars at home - Bayer DCA Hb A1c Waived - Vitamin B12  2. Hyperlipidemia He is not on cholesterol medicine. Last check showed LDL of 86 - Lipid panel  3. Essential hypertension Sugar is  good. Not on medication but probably should be on ACE inhibitor for renal protective - CMP14+EGFR - Vitamin B12

## 2015-11-02 NOTE — Patient Instructions (Signed)
Hamstring Strain With Rehab The hamstring muscle and tendons are vulnerable to muscle or tendon tear (strain). Hamstring tears cause pain and inflammation in the backside of the thigh, where the hamstring muscles are located. The hamstring is comprised of three muscles that are responsible for straightening the hip, bending the knee, and stabilizing the knee. These muscles are important for walking, running, and jumping. Hamstring strain is the most common injury of the thigh. Hamstring strains are classified as grade 1 or 2 strains. Grade 1 strains cause pain, but the tendon is not lengthened. Grade 2 strains include a lengthened ligament due to the ligament being stretched or partially ruptured. With grade 2 strains there is still function, although the function may be decreased.  SYMPTOMS   Pain, tenderness, swelling, warmth, or redness over the hamstring muscles, at the back of the thigh.  Pain that gets worse during and after intense activity.  A "pop" heard in the area, at the time of injury.  Muscle spasm in the hamstring muscles.  Pain or weakness with running, jumping, or bending the knee against resistance.  Crackling sound (crepitation) when the tendon is moved or touched.  Bruising (contusion) in the thigh within 48 hours of injury.  Loss of fullness of the muscle, or area of muscle bulging in the case of a complete rupture. CAUSES  A muscle strain occurs when a force is placed on the muscle or tendon that is greater than it can withstand. Common causes of injury include:  Strain from overuse or sudden increase in the frequency, duration, or intensity of activity.  Single violent blow or force to the back of the knee or the hamstring area of the thigh. RISK INCREASES WITH:  Sports that require quick starts (sprinting, racquetball, tennis).  Sports that require jumping (basketball, volleyball).  Kicking sports and water skiing.  Contact sports (soccer, football).  Poor  strength and flexibility.  Failure to warm up properly before activity.  Previous thigh, knee, or pelvis injury.  Poor exercise technique.  Poor posture. PREVENTION  Maintain physical fitness:  Strength, flexibility, and endurance.  Cardiovascular fitness.  Learn and use proper exercise technique and posture.  Wear proper fitted and padded protective equipment. PROGNOSIS  If treated properly, hamstring strains are usually curable in 2 to 6 weeks. RELATED COMPLICATIONS   Longer healing time, if not properly treated or if not given adequate time to heal.  Chronically inflamed tendon, causing persistent pain with activity that may progress to constant pain.  Recurring symptoms, if activity is resumed too soon.  Vulnerable to repeated injury (in up to 25% of cases). TREATMENT  Treatment first involves the use of ice and medication to help reduce pain and inflammation. It is also important to complete strengthening and stretching exercises, as well as modifying any activities that aggravate the symptoms. These exercises may be completed at home or with a therapist. Your caregiver may recommend the use of crutches to help reduce pain and discomfort, especially is the strain is severe enough to cause limping. If the tendon has pulled away from the bone, then surgery is necessary to reattach it. MEDICATION   If pain medicine is needed, nonsteroidal anti-inflammatory medicines (aspirin and ibuprofen), or other minor pain relievers (acetaminophen), are often advised.  Do not take pain medicine for 7 days before surgery.  Prescription pain relievers may be given if your caregiver thinks they are needed. Use only as directed and only as much as you need.  Corticosteroid injections may be   recommended. However, these injections should only be used for serious cases, as they can only be given a certain number of times.  Ointments applied to the skin may be beneficial. HEAT AND  COLD  Cold treatment (icing) relieves pain and reduces inflammation. Cold treatment should be applied for 10 to 15 minutes every 2 to 3 hours, and immediately after activity that aggravates your symptoms. Use ice packs or an ice massage.  Heat treatment may be used before performing the stretching and strengthening activities prescribed by your caregiver, physical therapist, or athletic trainer. Use a heat pack or a warm water soak. SEEK MEDICAL CARE IF:   Symptoms get worse or do not improve in 2 weeks, despite treatment.  New, unexplained symptoms develop. (Drugs used in treatment may produce side effects.) EXERCISES RANGE OF MOTION (ROM) AND STRETCHING EXERCISES - Hamstring Strain These exercises may help you when beginning to rehabilitate your injury. Your symptoms may go away with or without further involvement from your physician, physical therapist or athletic trainer. While completing these exercises, remember:   Restoring tissue flexibility helps normal motion to return to the joints. This allows healthier, less painful movement and activity.  An effective stretch should be held for at least 30 seconds.  A stretch should never be painful. You should only feel a gentle lengthening or release in the stretched tissue. STRETCH - Hamstrings, Standing  Stand or sit, and extend your right / left leg, placing your foot on a chair or foot stool.  Keep a slight arch in your low back and your hips straight forward.  Lead with your chest, and lean forward at the waist until you feel a gentle stretch in the back of your right / left knee or thigh. (When done correctly, this exercise requires leaning only a small distance.)  Hold this position for __________ seconds. Repeat __________ times. Complete this stretch __________ times per day. STRETCH - Hamstrings, Supine   Lie on your back. Loop a belt or towel over the ball of your right / left foot.  Straighten your right / left knee and  slowly pull on the belt to raise your leg. Do not allow the right / left knee to bend. Keep your opposite leg flat on the floor.  Raise the leg until you feel a gentle stretch behind your right / left knee or thigh. Hold this position for __________ seconds. Repeat __________ times. Complete this stretch __________ times per day.  STRETCH - Hamstrings, Doorway  Lie on your back with your right / left leg extended and resting on the wall, and the opposite leg flat on the ground through the door. Initially, position your bottom farther away from the wall.  Keep your right / left knee straight. If you feel a stretch behind your knee or thigh, hold this position for __________ seconds.  If you do not feel a stretch, scoot your bottom closer to the door and hold __________ seconds. Repeat __________ times. Complete this stretch __________ times per day.  STRETCH - Hamstrings/Adductors, V-Sit   Sit on the floor with your legs extended in a large "V," keeping your knees straight.  With your head and chest upright, bend at your waist reaching for your left foot to stretch your right thigh muscles.  You should feel a stretch in your right inner thigh. Hold for __________ seconds.  Return to the upright position to relax your leg muscles.  Continuing to keep your chest upright, bend straight forward at your   waist to stretch your hamstrings.  You should feel a stretch behind both of your thighs and knees. Hold for __________ seconds.  Return to the upright position to relax your leg muscles.  With your head and chest upright, bend at your waist reaching for your right foot to stretch your left thigh muscles.  You should feel a stretch in your left inner thigh. Hold for __________ seconds.  Return to the upright position to relax your leg muscles. Repeat __________ times. Complete this exercise __________ times per day.  STRENGTHENING EXERCISES - Hamstring Strain These exercises may help you  when beginning to rehabilitate your injury. They may resolve your symptoms with or without further involvement from your physician, physical therapist or athletic trainer. While completing these exercises, remember:   Muscles can gain both the endurance and the strength needed for everyday activities through controlled exercises.  Complete these exercises as instructed by your physician, physical therapist or athletic trainer. Increase the resistance and repetitions only as guided.  You may experience muscle soreness or fatigue, but the pain or discomfort you are trying to eliminate should never get worse during these exercises. If this pain does get worse, stop and make certain you are following the directions exactly. If the pain is still present after adjustments, discontinue the exercise until you can discuss the trouble with your clinician. STRENGTH - Hip Extensors, Straight Leg Raises   Lie on your stomach on a firm surface.  Tense the muscles in your buttocks to lift your right / left leg about 4 inches. If you cannot lift your leg this high without arching your back, place a pillow under your hips.  Keep your knee straight. Hold for __________ seconds.  Slowly lower your leg to the starting position and allow it to relax completely before starting the next repetition. Repeat __________ times. Complete this exercise __________ times per day.  STRENGTH - Hamstring, Isometrics   Lie on your back on a firm surface.  Bend your right / left knee approximately __________ degrees.  Dig your heel into the surface, as if you are trying to pull it toward your buttocks. Tighten the muscles in the back of your thighs to "dig" as hard as you can, without increasing any pain.  Hold this position for __________ seconds.  Release the tension gradually and allow your muscles to completely relax for __________ seconds between each exercise. Repeat __________ times. Complete this exercise __________  times per day.  STRENGTH - Hamstring, Curls   Lay on your stomach with your legs extended. (If you lay on a bed, your feet may hang over the edge.)  Tighten the muscles in the back of your thigh to bend your right / left knee up to 90 degrees. Keep your hips flat on the bed or floor.  Hold this position for __________ seconds.  Slowly lower your leg back to the starting position. Repeat __________ times. Complete this exercise __________ times per day.  OPTIONAL ANKLE WEIGHTS: Begin with ____________________, but DO NOT exceed ____________________. Increase in 1 pound/0.5 kilogram increments.   This information is not intended to replace advice given to you by your health care provider. Make sure you discuss any questions you have with your health care provider.   Document Released: 06/19/2005 Document Revised: 07/10/2014 Document Reviewed: 10/01/2008 Elsevier Interactive Patient Education 2016 [employer]  

## 2015-11-04 LAB — CMP14+EGFR
A/G RATIO: 1.3 (ref 1.2–2.2)
ALBUMIN: 4.2 g/dL (ref 3.6–4.8)
ALT: 38 IU/L (ref 0–44)
AST: 30 IU/L (ref 0–40)
Alkaline Phosphatase: 112 IU/L (ref 39–117)
BILIRUBIN TOTAL: 0.5 mg/dL (ref 0.0–1.2)
BUN / CREAT RATIO: 19 (ref 10–24)
BUN: 17 mg/dL (ref 8–27)
CALCIUM: 9.1 mg/dL (ref 8.6–10.2)
CHLORIDE: 95 mmol/L — AB (ref 96–106)
CO2: 20 mmol/L (ref 18–29)
Creatinine, Ser: 0.88 mg/dL (ref 0.76–1.27)
GFR calc Af Amer: 103 mL/min/{1.73_m2} (ref >59)
GFR, EST NON AFRICAN AMERICAN: 89 mL/min/{1.73_m2} (ref >59)
Globulin, Total: 3.2 g/dL (ref 1.5–4.5)
Glucose: 201 mg/dL — ABNORMAL HIGH (ref 65–99)
Potassium: 4.1 mmol/L (ref 3.5–5.2)
Sodium: 133 mmol/L — ABNORMAL LOW (ref 134–144)
TOTAL PROTEIN: 7.4 g/dL (ref 6.0–8.5)

## 2015-11-04 LAB — LIPID PANEL
CHOL/HDL RATIO: 4.2 ratio (ref 0.0–5.0)
Cholesterol, Total: 185 mg/dL (ref 100–199)
HDL: 44 mg/dL (ref >39)
LDL Calculated: 99 mg/dL (ref 0–99)
Triglycerides: 211 mg/dL — ABNORMAL HIGH (ref 0–149)
VLDL CHOLESTEROL CAL: 42 mg/dL — AB (ref 5–40)

## 2015-11-04 LAB — VITAMIN B12: VITAMIN B 12: 1257 pg/mL — AB (ref 211–946)

## 2015-11-11 ENCOUNTER — Ambulatory Visit: Payer: Medicare Other | Admitting: Family Medicine

## 2015-11-15 ENCOUNTER — Other Ambulatory Visit: Payer: Self-pay | Admitting: Family Medicine

## 2015-11-15 MED ORDER — OMEPRAZOLE 20 MG PO CPDR
20.0000 mg | DELAYED_RELEASE_CAPSULE | Freq: Every day | ORAL | Status: DC
Start: 2015-11-15 — End: 2016-01-17

## 2015-11-15 NOTE — Telephone Encounter (Signed)
done

## 2016-01-05 ENCOUNTER — Emergency Department (HOSPITAL_COMMUNITY)
Admission: EM | Admit: 2016-01-05 | Discharge: 2016-01-06 | Disposition: A | Discharge status: Home / Self Care | Payer: Medicare Other | Attending: Emergency Medicine | Admitting: Emergency Medicine

## 2016-01-05 ENCOUNTER — Encounter (HOSPITAL_COMMUNITY): Payer: Self-pay

## 2016-01-05 ENCOUNTER — Emergency Department (HOSPITAL_COMMUNITY): Payer: Medicare Other

## 2016-01-05 DIAGNOSIS — Z7984 Long term (current) use of oral hypoglycemic drugs: Secondary | ICD-10-CM | POA: Insufficient documentation

## 2016-01-05 DIAGNOSIS — Z049 Encounter for examination and observation for unspecified reason: Secondary | ICD-10-CM | POA: Diagnosis not present

## 2016-01-05 DIAGNOSIS — S3992XA Unspecified injury of lower back, initial encounter: Secondary | ICD-10-CM | POA: Diagnosis present

## 2016-01-05 DIAGNOSIS — Y9389 Activity, other specified: Secondary | ICD-10-CM | POA: Diagnosis not present

## 2016-01-05 DIAGNOSIS — I10 Essential (primary) hypertension: Secondary | ICD-10-CM | POA: Diagnosis not present

## 2016-01-05 DIAGNOSIS — S32019A Unspecified fracture of first lumbar vertebra, initial encounter for closed fracture: Secondary | ICD-10-CM | POA: Diagnosis not present

## 2016-01-05 DIAGNOSIS — M549 Dorsalgia, unspecified: Secondary | ICD-10-CM | POA: Diagnosis not present

## 2016-01-05 DIAGNOSIS — Z794 Long term (current) use of insulin: Secondary | ICD-10-CM | POA: Insufficient documentation

## 2016-01-05 DIAGNOSIS — Z7982 Long term (current) use of aspirin: Secondary | ICD-10-CM | POA: Diagnosis not present

## 2016-01-05 DIAGNOSIS — Y999 Unspecified external cause status: Secondary | ICD-10-CM | POA: Insufficient documentation

## 2016-01-05 DIAGNOSIS — Z87891 Personal history of nicotine dependence: Secondary | ICD-10-CM | POA: Insufficient documentation

## 2016-01-05 DIAGNOSIS — Y92009 Unspecified place in unspecified non-institutional (private) residence as the place of occurrence of the external cause: Secondary | ICD-10-CM | POA: Diagnosis not present

## 2016-01-05 DIAGNOSIS — W1839XA Other fall on same level, initial encounter: Secondary | ICD-10-CM | POA: Diagnosis not present

## 2016-01-05 DIAGNOSIS — G301 Alzheimer's disease with late onset: Secondary | ICD-10-CM | POA: Diagnosis not present

## 2016-01-05 DIAGNOSIS — E119 Type 2 diabetes mellitus without complications: Secondary | ICD-10-CM | POA: Diagnosis not present

## 2016-01-05 DIAGNOSIS — E785 Hyperlipidemia, unspecified: Secondary | ICD-10-CM | POA: Diagnosis not present

## 2016-01-05 DIAGNOSIS — S32010A Wedge compression fracture of first lumbar vertebra, initial encounter for closed fracture: Secondary | ICD-10-CM | POA: Insufficient documentation

## 2016-01-05 DIAGNOSIS — F0280 Dementia in other diseases classified elsewhere without behavioral disturbance: Secondary | ICD-10-CM | POA: Diagnosis not present

## 2016-01-05 DIAGNOSIS — W19XXXA Unspecified fall, initial encounter: Secondary | ICD-10-CM

## 2016-01-05 DIAGNOSIS — M545 Low back pain: Secondary | ICD-10-CM | POA: Diagnosis not present

## 2016-01-05 DIAGNOSIS — S32000A Wedge compression fracture of unspecified lumbar vertebra, initial encounter for closed fracture: Secondary | ICD-10-CM

## 2016-01-05 DIAGNOSIS — R482 Apraxia: Secondary | ICD-10-CM | POA: Diagnosis not present

## 2016-01-05 MED ORDER — KETOROLAC TROMETHAMINE 30 MG/ML IJ SOLN
30.0000 mg | Freq: Once | INTRAMUSCULAR | Status: AC
  Administered 2016-01-05: 30 mg via INTRAVENOUS
  Filled 2016-01-05: qty 1

## 2016-01-05 NOTE — ED Notes (Signed)
Patient placed on 2 liters of oxygen via nasal canula. Room air oxygen saturation 85%. Patient states does not wear oxygen at home and received 100 mcg of fentanyl via EMS.

## 2016-01-05 NOTE — ED Notes (Signed)
Pt states he was trying to move a computer monitor and lost his balance and fell onto his left side, strained his lower back d/t same.  Pt was given 100 mcg of fentanyl en route by ems with relief of pain

## 2016-01-05 NOTE — ED Provider Notes (Signed)
CSN: [id number]     Arrival date & time 01/05/16  2251 History  By signing my name below, I, Higinio Plan, attest that this documentation has been prepared under the direction and in the presence of Rolland Porter, MD at 23:33 PM . Electronically Signed: Higinio Plan, Scribe. 01/05/2016. 3:42 AM.   Chief Complaint  Patient presents with  . Back Injury   The history is provided by the patient. No language interpreter was used.   HPI Comments: IZYAN EZZELL is a 70 y.o. male with PMHx of DM, HTN, and HLD, who presents to the Emergency Department complaining of gradually worsening,lower back pain s/p a fall that occurred PTA. Pt reports his pain is centered right at his usual belt-line. He notes he was picking up a computer monitor from the floor when he lost his balance and fell onto his left side on the floor; pt reports a hx of balance problems. He denies hitting his head or LOC at the time of his fall. He states hx of back problems And states "it goes out" every 4-5 years; though, he denies Surgery to his back or steroid injections. He denies numbness or tingling in his legs and pain on his ribs.  He denies feeling short of breath, he denies neck pain.  PCP Dr Laurance Flatten in [place]  Past Medical History  Diagnosis Date  . Diabetes mellitus without complication ([place])   . AAA (abdominal aortic aneurysm) ([place])   . Hypertension   . Anemia   . History of gallstones   . Vitamin D deficiency   . Hyperlipidemia   . Ataxia    Past Surgical History  Procedure Laterality Date  . Ercp    . Inguinal hernia repair    . Tonsillectomy     Family History  Problem Relation Age of Onset  . Diabetes Mother   . Hip fracture Mother   . Pulmonary embolism Mother     after hip fracture  . Lupus Sister   . Scleroderma Sister   . Diabetes Brother   . Hyperlipidemia Brother   . Hypertension Brother    Social History  Substance Use Topics  . Smoking status: Former Smoker -- 1.00 packs/day for 40 years    Types:  Cigarettes    Start date: 07/03/1966    Quit date: 02/06/2009  . Smokeless tobacco: Never Used  . Alcohol Use: No  Pt states he quit smoking ~5-6 years ago;  he states he is currently retired from [employer].     Review of Systems  Musculoskeletal: Positive for myalgias and back pain.  Neurological: Negative for numbness.  All other systems reviewed and are negative.  Allergies  Review of patient's allergies indicates no known allergies.  Home Medications   Prior to Admission medications   Medication Sig Start Date End Date Taking? Authorizing Provider  aspirin 81 MG tablet Take 81 mg by mouth daily.   Yes Historical Provider, MD  Insulin Glargine (LANTUS SOLOSTAR) 100 UNIT/ML Solostar Pen Inject 42 Units into the skin 2 (two) times daily. Patient taking differently: Inject 50 Units into the skin 2 (two) times daily.  11/02/15  Yes Wardell Honour, MD  metFORMIN (GLUCOPHAGE) 1000 MG tablet Take 1 tablet (1,000 mg total) by mouth 2 (two) times daily with a meal. 11/02/15  Yes Wardell Honour, MD  omeprazole (PRILOSEC) 20 MG capsule Take 1 capsule (20 mg total) by mouth daily. 11/15/15  Yes Wardell Honour, MD  glucose blood (FREESTYLE LITE) test strip Use to check BG up to twice daily (Dx: E11.8, E11.65, Z79.4 - type 2 DM with complications and long term insulin use) 04/29/15   Wardell Honour, MD  glucose monitoring kit (FREESTYLE) monitoring kit 1 each by Does not apply route as needed for other. Use to check BG up to twice daily (Dx: E11.8, E11.65, Z79.4 - type 2 DM with complications and long term insulin use) 04/29/15   Wardell Honour, MD  Insulin Pen Needle (SURE COMFORT PEN NEEDLES) 32G X 6 MM MISC Use with insulin pen twice daily 07/14/15   Wardell Honour, MD  Lancets (FREESTYLE) lancets Use to check BG up to twice daily (Dx: E11.8, E11.65, Z79.4 - type 2 DM with complications and long term insulin use) 04/29/15   Wardell Honour, MD   oxyCODONE-acetaminophen (PERCOCET/ROXICET) 5-325 MG tablet Take 1-2 tablets by mouth every 6 (six) hours as needed for severe pain. 01/06/16   Rolland Porter, MD  oxyCODONE-acetaminophen (PERCOCET/ROXICET) 5-325 MG tablet Take 1-2 tablets by mouth every 6 (six) hours as needed for severe pain. 01/06/16   Rolland Porter, MD   BP 137/79 mmHg  Pulse 68  Temp(Src) 97.7 F (36.5 C) (Oral)  Resp 14  Ht '6\' 1"'  (1.854 m)  Wt 189 lb (85.73 kg)  BMI 24.94 kg/m2  SpO2 92%  Vital signs normal   Physical Exam  Constitutional: He is oriented to person, place, and time. He appears well-developed and well-nourished.  Non-toxic appearance. He does not appear ill. No distress.  HENT:  Head: Normocephalic and atraumatic.  Right Ear: External ear normal.  Left Ear: External ear normal.  Nose: Nose normal. No mucosal edema or rhinorrhea.  Mouth/Throat: Oropharynx is clear and moist and mucous membranes are normal. No dental abscesses or uvula swelling.  Eyes: Conjunctivae and EOM are normal. Pupils are equal, round, and reactive to light.  Neck: Normal range of motion and full passive range of motion without pain. Neck supple.  Cardiovascular: Normal rate, regular rhythm and normal heart sounds.  Exam reveals no gallop and no friction rub.   No murmur heard. Pulmonary/Chest: Effort normal and breath sounds normal. No respiratory distress. He has no wheezes. He has no rhonchi. He has no rales. He exhibits no tenderness and no crepitus.  Abdominal: Soft. Normal appearance and bowel sounds are normal. He exhibits no distension. There is no tenderness. There is no rebound and no guarding.  Musculoskeletal: Normal range of motion. He exhibits no edema or tenderness.       Back:  Well localized tenderness to palpation of lumbar at level  L1 or L2, no parapinus muscle tenderness. He seems painful when he changes positions.  Neurological: He is alert and oriented to person, place, and time. He has normal strength. No cranial  nerve deficit.  Skin: Skin is warm, dry and intact. No rash noted. No erythema. No pallor.  Psychiatric: He has a normal mood and affect. His speech is normal and behavior is normal. His mood appears not anxious.  Nursing note and vitals reviewed.   ED Course  Procedures   Medications  ketorolac (TORADOL) 30 MG/ML injection 30 mg (30 mg Intravenous Given 01/05/16 2352)  oxyCODONE-acetaminophen (PERCOCET/ROXICET) 5-325 MG per tablet 1 tablet (1 tablet Oral Given 01/06/16 0051)    DIAGNOSTIC STUDIES:  Oxygen Saturation is 92% on RA, adequate by my interpretation.    COORDINATION OF CARE:  11:36 PM Discussed treatment plan with pt at bedside  and pt agreed to plan.  Patient had been given 100 g of fentanyl prior to arrival to the ED EMS. His pulse ox was noted to drop to 85% and nasal cannula oxygen was placed. He was given IV Toradol after reviewing his x-ray.  12:40 AM Pt has been told he has a fracture to his left lumbar spine. Pt agrees to get a CT of his spine.   CT of the lumbar spine was ordered to further clarify his fracture. He was then given oral Percocet.  TLSO was ordered however patient does not want to wait for them to come to the ED and fitting. He was given the address and a prescription to get it done later today in [place]. He also is given referral to Dr. Waldemar Dickens to see if his fracture is a minimal to kyphoplasty and he also was referred to Dr. Kathyrn Sheriff, neurosurgeon to follow his fracture. Due to pharmacies being close to his given a prepack of Percocet to take for his acute fracture pain.  [place] has no entries in the past 6 months.  Imaging Review Dg Lumbar Spine Complete  01/06/2016  CLINICAL DATA:  Fall yesterday with lumbosacral back pain localized in the upper lumbar spine midline. EXAM: LUMBAR SPINE - COMPLETE 4+ VIEW COMPARISON:  None. FINDINGS: Mild compression deformity of L1 vertebral body with 20-30% loss of height central and  anteriorly. Possible buckling of the posterior cortex. Remaining lumbar vertebral body heights are maintained. No additional acute fracture. There is facet arthropathy in the lower lumbar spine. IMPRESSION: Mild L1 compression deformity, likely acute. Electronically Signed   By: Jeb Levering M.D.   On: 01/06/2016 00:32   Ct Lumbar Spine Wo Contrast  01/06/2016  CLINICAL DATA:  Status post fall. Assess fracture of L1, noted on radiograph. Initial encounter. EXAM: CT LUMBAR SPINE WITHOUT CONTRAST TECHNIQUE: Multidetector CT imaging of the lumbar spine was performed without intravenous contrast administration. Multiplanar CT image reconstructions were also generated. COMPARISON:  Lumbar spine radiographs performed 01/05/2016 FINDINGS: There is an acute fracture of vertebral body L1, with approximately 55% loss of height. The fracture extends through the anterior aspect of the vertebral body. The posterior portion of the vertebral body appears intact. There is no evidence of extension to the posterior elements or retropulsion. No additional fractures are seen. Intervertebral disc spaces are preserved. Mild vacuum phenomenon is noted at L5-S1. Circumferential disc bulges are seen at L3-L4, L4-L5 and L5-S1. There is no evidence of impression on exiting nerve roots. Scattered calcification is seen along the abdominal aorta and its branches. Mild nonspecific perinephric stranding is noted bilaterally. The paraspinal musculature is unremarkable in appearance. IMPRESSION: 1. Acute fracture involving the anterior half of vertebral body L1, with approximately 55% loss of height. No evidence of involvement of the posterior elements. No evidence of retropulsion. 2. Scattered calcification along the abdominal aorta and its branches. Electronically Signed   By: Garald Balding M.D.   On: 01/06/2016 02:18   I have personally reviewed and evaluated these images and lab results as part of my medical decision-making.    MDM    Final diagnoses:  Lumbar compression fracture, closed, initial encounter ([place])  Fall at home, initial encounter   Discharge Medication List as of 01/06/2016  3:23 AM    START taking these medications   Details  !! oxyCODONE-acetaminophen (PERCOCET/ROXICET) 5-325 MG tablet Take 1-2 tablets by mouth every 6 (six) hours as needed for severe pain., Starting 01/06/2016, Until Discontinued,  Print    !! oxyCODONE-acetaminophen (PERCOCET/ROXICET) 5-325 MG tablet Take 1-2 tablets by mouth every 6 (six) hours as needed for severe pain., Starting 01/06/2016, Until Discontinued, Print     !! - Potential duplicate medications found. Please discuss with provider.      Plan discharge  Rolland Porter, MD, Barbette Or, MD 01/06/16 [id number]

## 2016-01-06 ENCOUNTER — Emergency Department (HOSPITAL_COMMUNITY): Payer: Medicare Other

## 2016-01-06 ENCOUNTER — Ambulatory Visit: Payer: Medicare Other | Admitting: Pediatrics

## 2016-01-06 ENCOUNTER — Telehealth: Payer: Self-pay | Admitting: Family Medicine

## 2016-01-06 DIAGNOSIS — S32010A Wedge compression fracture of first lumbar vertebra, initial encounter for closed fracture: Secondary | ICD-10-CM | POA: Diagnosis not present

## 2016-01-06 DIAGNOSIS — M545 Low back pain: Secondary | ICD-10-CM | POA: Diagnosis not present

## 2016-01-06 MED ORDER — OXYCODONE-ACETAMINOPHEN 5-325 MG PO TABS
1.0000 | ORAL_TABLET | Freq: Once | ORAL | Status: AC
  Administered 2016-01-06: 1 via ORAL
  Filled 2016-01-06: qty 1

## 2016-01-06 MED ORDER — OXYCODONE-ACETAMINOPHEN 5-325 MG PO TABS: 1.0000 | ORAL_TABLET | Freq: Four times a day (QID) | ORAL | Status: DC | PRN

## 2016-01-06 NOTE — ED Notes (Signed)
[facility] paged for TLSO brace. Per tech, it would be about 1 hour and 30 minutes before someone would be here to fit him for brace. Patient given the option per Dr. Lynelle DoctorKnapp to wait or to go get brace later today at the office. Patient stated he would go to office later today.

## 2016-01-06 NOTE — Discharge Instructions (Signed)
Use ice on the area. Take the percocet for pain. Call Dr Fatima Sangereveshwar's office to get an appointment to see if you would benefit from kyphoplasty (injecting cement into the broken bone to expand the bone back to normal height).  Call [facility] at [phone] to get an appointment to be fitted for a TLSO for your 1st lumbar compression fracture.  Their address is [address] in [place].  Call Dr Conchita ParisNundkumar, a neurosurgeon to follow your fracture until healed.    Thoracolumbosacral Orthosis Brace A thoracolumbosacral orthosis (TLSO) brace can be worn for different purposes. A TLSO brace can be worn to support the back. Your back may need more support if you had a broken bone in your back (fractured vertebrae), back surgery, or you cannot move (paralysis) your torso muscles. A TLSO brace can also be worn by a child to help prevent curving back problems from getting worse as the child grows. TLSO braces are used to limit bending and twisting. The braces are made from a hard plastic. They are custom fit for each wearer. The TLSO brace covers both the front and back of the torso. On the back, the brace reaches from just above the tailbone to just below the shoulder blades. On the front, the brace covers the ribs and often reaches past the waist and over the hips. The brace can be worn under clothing.  HOME CARE INSTRUCTIONS  Ask your caregiver if you can remove your brace when showering or sleeping.  Follow your caregiver's instructions for putting on your brace.  Follow your caregiver's instructions about how much weight you can lift.  Always wear a T-shirt under the brace.  Make sure the brace is always well-aligned and fits you closely.  Check your skin daily for sores and redness caused by rubbing or pressure.  If you feel unsteady, use a cane or walker for support until you feel more steady.  Sit in high, firm chairs. It may be difficult to stand up from low, soft  chairs.  Keep all follow-up appointments as directed by your caregiver. SEEK IMMEDIATE MEDICAL CARE IF:  You have a fever.  You have shortness of breath.  You have chest pain.  You have numbness, tingling, or pain. Developed in conjunction with the [employer] [facility] [facility].   This information is not intended to replace advice given to you by your health care provider. Make sure you discuss any questions you have with your health care provider.   Document Released: 06/08/2011 Document Revised: 09/11/2011 Document Reviewed: 06/08/2011 Elsevier Interactive Patient Education 2016 Elsevier Inc.  Lumbar Fracture A lumbar fracture is a break in one of the bones of the lower back. Lumbar fractures range in severity. Severe fractures can damage the spinal cord. CAUSES This condition may be caused by:  A fall (common).  A car accident (common).  A gunshot wound.  A hard, direct hit to the back.  Osteoporosis. SYMPTOMS The main symptom of this condition is severe pain in the lower back. If a fracture is complex or severe, there may also be:  A misshapen or swollen area on the lower back.  A limited ability to move an area of the lower back.  An inability to empty the bladder or bowel.  A loss of strength or sensation in the legs, feet, and toes.  Paralysis. DIAGNOSIS This condition is diagnosed based on:  A physical exam.  Symptoms and what happened just before they developed.  The results of imaging tests, such as an X-ray, CT scan, or MRI. If your nerves have been damaged, you may also have other tests to find out how much damage there is. TREATMENT Treatment for this condition depends on the specifics of the injury. Most fractures can be treated with:  A back brace.  Bed rest and activity restrictions.  Pain medicine.  Physical therapy. Fractures that are complex, involve multiple bones, or make the spine  unstable may require surgery to remove pressure from the nerves or spinal cord and to stabilize the broken pieces of bone. During recovery, it is normal to have pain and stiffness in the back for weeks. HOME CARE INSTRUCTIONS Medicines  Take medicines only as directed by your health care provider.  Do not drive or operate heavy machinery while taking pain medicine. Activity  Stay in bed for as long as directed by your health care provider.  If you were shown how to do any exercises to improve motion and strength in your back, do them as directed by your health care provider.  Return to your normal activities as directed by your health care provider. Ask your health care provider what activities are safe for you. General Instructions  If you were given a neck brace or back brace, wear it as directed by your health care provider.  Keep all follow-up visits as directed by your health care provider. This is important. Failure to follow-up as recommended could result in permanent injury, disability, and long-lasting (chronic) pain. SEEK MEDICAL CARE IF:  Your pain does not improve over time.  You have a persistent cough.  You cannot return to your normal activities as planned or expected. SEEK IMMEDIATE MEDICAL CARE IF:  You have severe pain or your pain suddenly gets worse.  You are unable to move.  You have numbness, tingling, weakness, or paralysis in any part of your body.  You cannot control your bladder or bowel.  You have difficulty breathing.  You have a fever.  You have pain in your chest or abdomen.  You vomit.   This information is not intended to replace advice given to you by your health care provider. Make sure you discuss any questions you have with your health care provider.   Document Released: 10/04/2006 Document Revised: 11/03/2014 Document Reviewed: 06/15/2014 Elsevier Interactive Patient Education 2016 Elsevier Inc.  Percutaneous  Vertebroplasty Percutaneous (through the skin) vertebroplasty is a procedure used to treat collapsed bones (compression fractures) of the spine. Spine (vertebral) fractures can be painful and limit movement. Percutaneous vertebroplasty stabilizes the fracture by injecting bone cement into the collapsed bone. This restores the vertebra and helps prevent further collapse.  LET [facility]OUR HEALTH CARE PROVIDER KNOW ABOUT:  Any allergies you have.  All medicines you are taking, including vitamins, herbs, eye drops, creams, and over-the-counter medicines.  Previous problems you or members of your family have had with the use of anesthetics.  Any blood disorders you have.  Previous surgeries you have had. RISKS AND COMPLICATIONS Generally, this is a safe procedure. However, as with any procedure, complications can occur. Possible complications include:  Bone cement leakage.  Nerve damage.  Infection.  Need for another surgery.   Paralysis (very rare). Some people are at higher risk than others for this complication to occur. Your particular risks should be discussed with your health care provider. BEFORE THE PROCEDURE  Your health care provider may want you to have blood tests. These tests can help tell how well your  kidneys and liver are working. They can also show how well your blood clots.   You may be asked to stop using medicines that make it hard for your blood to clot. These can include blood thinners, aspirin, and nonsteroidal anti-inflammatory drugs (NSAIDs) like ibuprofen and naproxen. You may also need to stop taking vitamin E. Your health care provider will tell you when to stop taking these medicines, and when it is safe to start taking them again.  You may need to take medicine to help make your bones stronger. This will help prepare you for the procedure.  Do not eat or drink for 8 hours before your procedure or as told by your health care provider.  You might be asked to  shower or wash at home with a soap that kills skin bacteria.  Make arrangements for someone to drive you home and stay with you for 24 hours. PROCEDURE  An IV tube will be inserted into one of your veins. Medicine to help you relax (sedative) will be given through the IV tube.  You will lie face down for the procedure.  Medicine that numbs the area (local anesthetic) will be injected into the skin right above the fractured vertebra. A small cut (incision) is then made in that same area.  A hollow needle is inserted through the incision. An X-ray machine (fluoroscope) is used to guide the needle to the fractured vertebrae.  Bone cement is put through the hollow needle into the fractured vertebra. It hardens in about 20 minutes.  A bandage (dressing) is put over the incision site. AFTER THE PROCEDURE  You will stay in a recovery area until you are awake enough to eat and drink.  You will be checked to make sure you can get out of bed and walk around comfortably.  Some people find that pain relief is immediate. Others may notice pain going away within 2 days of the procedure.   This information is not intended to replace advice given to you by your health care provider. Make sure you discuss any questions you have with your health care provider.   Document Released: 02/15/2011 Document Revised: 06/24/2013 Document Reviewed: 02/24/2013 Elsevier Interactive Patient Education [employer]

## 2016-01-06 NOTE — Telephone Encounter (Signed)
Patient has had a fall and has a lower lumbar fracture.  His wife has called and is trying to get an order faxed to [facility] for a wheelchair for him.  Please advise.

## 2016-01-07 DIAGNOSIS — E119 Type 2 diabetes mellitus without complications: Secondary | ICD-10-CM | POA: Diagnosis not present

## 2016-01-07 DIAGNOSIS — Z7984 Long term (current) use of oral hypoglycemic drugs: Secondary | ICD-10-CM | POA: Diagnosis not present

## 2016-01-07 DIAGNOSIS — Z794 Long term (current) use of insulin: Secondary | ICD-10-CM | POA: Diagnosis not present

## 2016-01-07 DIAGNOSIS — E785 Hyperlipidemia, unspecified: Secondary | ICD-10-CM | POA: Diagnosis not present

## 2016-01-07 DIAGNOSIS — Z5181 Encounter for therapeutic drug level monitoring: Secondary | ICD-10-CM | POA: Diagnosis not present

## 2016-01-07 DIAGNOSIS — F0280 Dementia in other diseases classified elsewhere without behavioral disturbance: Secondary | ICD-10-CM | POA: Diagnosis not present

## 2016-01-07 DIAGNOSIS — D649 Anemia, unspecified: Secondary | ICD-10-CM | POA: Diagnosis not present

## 2016-01-07 DIAGNOSIS — W19XXXD Unspecified fall, subsequent encounter: Secondary | ICD-10-CM | POA: Diagnosis not present

## 2016-01-07 DIAGNOSIS — S32010D Wedge compression fracture of first lumbar vertebra, subsequent encounter for fracture with routine healing: Secondary | ICD-10-CM | POA: Diagnosis not present

## 2016-01-07 DIAGNOSIS — M549 Dorsalgia, unspecified: Secondary | ICD-10-CM | POA: Diagnosis not present

## 2016-01-07 DIAGNOSIS — E559 Vitamin D deficiency, unspecified: Secondary | ICD-10-CM | POA: Diagnosis not present

## 2016-01-07 DIAGNOSIS — Z9181 History of falling: Secondary | ICD-10-CM | POA: Diagnosis not present

## 2016-01-07 DIAGNOSIS — I10 Essential (primary) hypertension: Secondary | ICD-10-CM | POA: Diagnosis not present

## 2016-01-07 DIAGNOSIS — G300 Alzheimer's disease with early onset: Secondary | ICD-10-CM | POA: Diagnosis not present

## 2016-01-08 DIAGNOSIS — Z049 Encounter for examination and observation for unspecified reason: Secondary | ICD-10-CM | POA: Diagnosis not present

## 2016-01-08 DIAGNOSIS — Z7982 Long term (current) use of aspirin: Secondary | ICD-10-CM | POA: Diagnosis not present

## 2016-01-08 DIAGNOSIS — M545 Low back pain: Secondary | ICD-10-CM | POA: Diagnosis not present

## 2016-01-08 DIAGNOSIS — G309 Alzheimer's disease, unspecified: Secondary | ICD-10-CM | POA: Diagnosis present

## 2016-01-08 DIAGNOSIS — Z87891 Personal history of nicotine dependence: Secondary | ICD-10-CM | POA: Diagnosis not present

## 2016-01-08 DIAGNOSIS — M549 Dorsalgia, unspecified: Secondary | ICD-10-CM | POA: Diagnosis not present

## 2016-01-08 DIAGNOSIS — S32000A Wedge compression fracture of unspecified lumbar vertebra, initial encounter for closed fracture: Secondary | ICD-10-CM | POA: Diagnosis not present

## 2016-01-08 DIAGNOSIS — S32010A Wedge compression fracture of first lumbar vertebra, initial encounter for closed fracture: Secondary | ICD-10-CM | POA: Diagnosis not present

## 2016-01-08 DIAGNOSIS — F0280 Dementia in other diseases classified elsewhere without behavioral disturbance: Secondary | ICD-10-CM | POA: Diagnosis present

## 2016-01-08 DIAGNOSIS — I6523 Occlusion and stenosis of bilateral carotid arteries: Secondary | ICD-10-CM | POA: Diagnosis not present

## 2016-01-08 DIAGNOSIS — S32008A Other fracture of unspecified lumbar vertebra, initial encounter for closed fracture: Secondary | ICD-10-CM | POA: Diagnosis not present

## 2016-01-08 DIAGNOSIS — I6522 Occlusion and stenosis of left carotid artery: Secondary | ICD-10-CM | POA: Diagnosis present

## 2016-01-08 DIAGNOSIS — K219 Gastro-esophageal reflux disease without esophagitis: Secondary | ICD-10-CM | POA: Diagnosis present

## 2016-01-08 DIAGNOSIS — I714 Abdominal aortic aneurysm, without rupture: Secondary | ICD-10-CM | POA: Diagnosis present

## 2016-01-08 DIAGNOSIS — F419 Anxiety disorder, unspecified: Secondary | ICD-10-CM | POA: Diagnosis present

## 2016-01-08 DIAGNOSIS — Z79899 Other long term (current) drug therapy: Secondary | ICD-10-CM | POA: Diagnosis not present

## 2016-01-08 DIAGNOSIS — R0902 Hypoxemia: Secondary | ICD-10-CM | POA: Diagnosis present

## 2016-01-08 DIAGNOSIS — J441 Chronic obstructive pulmonary disease with (acute) exacerbation: Secondary | ICD-10-CM | POA: Diagnosis present

## 2016-01-08 DIAGNOSIS — S32019A Unspecified fracture of first lumbar vertebra, initial encounter for closed fracture: Secondary | ICD-10-CM | POA: Diagnosis present

## 2016-01-08 DIAGNOSIS — S32048A Other fracture of fourth lumbar vertebra, initial encounter for closed fracture: Secondary | ICD-10-CM | POA: Diagnosis not present

## 2016-01-08 DIAGNOSIS — E119 Type 2 diabetes mellitus without complications: Secondary | ICD-10-CM | POA: Diagnosis present

## 2016-01-08 DIAGNOSIS — W19XXXA Unspecified fall, initial encounter: Secondary | ICD-10-CM | POA: Diagnosis not present

## 2016-01-08 DIAGNOSIS — J189 Pneumonia, unspecified organism: Secondary | ICD-10-CM | POA: Diagnosis not present

## 2016-01-08 DIAGNOSIS — J13 Pneumonia due to Streptococcus pneumoniae: Secondary | ICD-10-CM | POA: Diagnosis present

## 2016-01-08 DIAGNOSIS — Z794 Long term (current) use of insulin: Secondary | ICD-10-CM | POA: Diagnosis not present

## 2016-01-10 MED FILL — Oxycodone w/ Acetaminophen Tab 5-325 MG: ORAL | Qty: 6 | Status: AC

## 2016-01-11 ENCOUNTER — Other Ambulatory Visit: Payer: Self-pay | Admitting: Family Medicine

## 2016-01-15 DIAGNOSIS — M549 Dorsalgia, unspecified: Secondary | ICD-10-CM | POA: Diagnosis not present

## 2016-01-15 DIAGNOSIS — E119 Type 2 diabetes mellitus without complications: Secondary | ICD-10-CM | POA: Diagnosis not present

## 2016-01-15 DIAGNOSIS — G300 Alzheimer's disease with early onset: Secondary | ICD-10-CM | POA: Diagnosis not present

## 2016-01-15 DIAGNOSIS — S32010D Wedge compression fracture of first lumbar vertebra, subsequent encounter for fracture with routine healing: Secondary | ICD-10-CM | POA: Diagnosis not present

## 2016-01-15 DIAGNOSIS — I10 Essential (primary) hypertension: Secondary | ICD-10-CM | POA: Diagnosis not present

## 2016-01-15 DIAGNOSIS — F0280 Dementia in other diseases classified elsewhere without behavioral disturbance: Secondary | ICD-10-CM | POA: Diagnosis not present

## 2016-01-17 ENCOUNTER — Telehealth: Payer: Self-pay | Admitting: Family Medicine

## 2016-01-17 ENCOUNTER — Ambulatory Visit (INDEPENDENT_AMBULATORY_CARE_PROVIDER_SITE_OTHER): Payer: Medicare Other | Admitting: Family Medicine

## 2016-01-17 ENCOUNTER — Ambulatory Visit (INDEPENDENT_AMBULATORY_CARE_PROVIDER_SITE_OTHER): Payer: Medicare Other

## 2016-01-17 ENCOUNTER — Other Ambulatory Visit: Payer: Self-pay | Admitting: *Deleted

## 2016-01-17 ENCOUNTER — Encounter: Payer: Self-pay | Admitting: Family Medicine

## 2016-01-17 VITALS — BP 117/85 | HR 104 | Temp 98.6°F

## 2016-01-17 DIAGNOSIS — K5900 Constipation, unspecified: Secondary | ICD-10-CM

## 2016-01-17 DIAGNOSIS — B370 Candidal stomatitis: Secondary | ICD-10-CM

## 2016-01-17 DIAGNOSIS — S32000A Wedge compression fracture of unspecified lumbar vertebra, initial encounter for closed fracture: Secondary | ICD-10-CM | POA: Diagnosis not present

## 2016-01-17 DIAGNOSIS — J189 Pneumonia, unspecified organism: Secondary | ICD-10-CM

## 2016-01-17 DIAGNOSIS — S32000S Wedge compression fracture of unspecified lumbar vertebra, sequela: Secondary | ICD-10-CM

## 2016-01-17 MED ORDER — NYSTATIN 100000 UNIT/ML MT SUSP: 5.0000 mL | Freq: Four times a day (QID) | OROMUCOSAL | Status: DC

## 2016-01-17 MED ORDER — OMEPRAZOLE 20 MG PO CPDR: 20.0000 mg | DELAYED_RELEASE_CAPSULE | Freq: Every day | ORAL | Status: DC
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
## 2016-01-17 NOTE — Telephone Encounter (Signed)
Patients wife Steward DroneBrenda aware that reports are not back yet.

## 2016-01-18 DIAGNOSIS — S32010D Wedge compression fracture of first lumbar vertebra, subsequent encounter for fracture with routine healing: Secondary | ICD-10-CM | POA: Diagnosis not present

## 2016-01-18 DIAGNOSIS — I10 Essential (primary) hypertension: Secondary | ICD-10-CM | POA: Diagnosis not present

## 2016-01-18 DIAGNOSIS — M549 Dorsalgia, unspecified: Secondary | ICD-10-CM | POA: Diagnosis not present

## 2016-01-18 DIAGNOSIS — F0280 Dementia in other diseases classified elsewhere without behavioral disturbance: Secondary | ICD-10-CM | POA: Diagnosis not present

## 2016-01-18 DIAGNOSIS — G300 Alzheimer's disease with early onset: Secondary | ICD-10-CM | POA: Diagnosis not present

## 2016-01-18 DIAGNOSIS — E119 Type 2 diabetes mellitus without complications: Secondary | ICD-10-CM | POA: Diagnosis not present

## 2016-01-18 NOTE — Telephone Encounter (Signed)
Pt coming 01/31/16, if still needs face to face should be done

## 2016-01-20 ENCOUNTER — Telehealth: Payer: Self-pay | Admitting: Family Medicine

## 2016-01-20 ENCOUNTER — Other Ambulatory Visit: Payer: Self-pay | Admitting: Family

## 2016-01-20 DIAGNOSIS — S32010D Wedge compression fracture of first lumbar vertebra, subsequent encounter for fracture with routine healing: Secondary | ICD-10-CM | POA: Diagnosis not present

## 2016-01-20 DIAGNOSIS — E119 Type 2 diabetes mellitus without complications: Secondary | ICD-10-CM | POA: Diagnosis not present

## 2016-01-20 DIAGNOSIS — I10 Essential (primary) hypertension: Secondary | ICD-10-CM | POA: Diagnosis not present

## 2016-01-20 DIAGNOSIS — F0280 Dementia in other diseases classified elsewhere without behavioral disturbance: Secondary | ICD-10-CM | POA: Diagnosis not present

## 2016-01-20 DIAGNOSIS — G300 Alzheimer's disease with early onset: Secondary | ICD-10-CM | POA: Diagnosis not present

## 2016-01-20 DIAGNOSIS — M549 Dorsalgia, unspecified: Secondary | ICD-10-CM | POA: Diagnosis not present

## 2016-01-20 DIAGNOSIS — R9389 Abnormal findings on diagnostic imaging of other specified body structures: Secondary | ICD-10-CM

## 2016-01-21 DIAGNOSIS — I10 Essential (primary) hypertension: Secondary | ICD-10-CM | POA: Diagnosis not present

## 2016-01-21 DIAGNOSIS — M549 Dorsalgia, unspecified: Secondary | ICD-10-CM | POA: Diagnosis not present

## 2016-01-21 DIAGNOSIS — G300 Alzheimer's disease with early onset: Secondary | ICD-10-CM | POA: Diagnosis not present

## 2016-01-21 DIAGNOSIS — S32010D Wedge compression fracture of first lumbar vertebra, subsequent encounter for fracture with routine healing: Secondary | ICD-10-CM | POA: Diagnosis not present

## 2016-01-21 DIAGNOSIS — R918 Other nonspecific abnormal finding of lung field: Secondary | ICD-10-CM | POA: Diagnosis not present

## 2016-01-21 DIAGNOSIS — E119 Type 2 diabetes mellitus without complications: Secondary | ICD-10-CM | POA: Diagnosis not present

## 2016-01-21 DIAGNOSIS — F0280 Dementia in other diseases classified elsewhere without behavioral disturbance: Secondary | ICD-10-CM | POA: Diagnosis not present

## 2016-01-21 NOTE — Telephone Encounter (Signed)
Spoke with wife and appointment date/time given

## 2016-01-22 ENCOUNTER — Telehealth: Payer: Self-pay

## 2016-01-22 ENCOUNTER — Other Ambulatory Visit: Payer: Self-pay | Admitting: Family

## 2016-01-22 MED ORDER — HYDROCODONE-ACETAMINOPHEN 10-325 MG PO TABS: 1.0000 | ORAL_TABLET | Freq: Four times a day (QID) | ORAL | Status: DC | PRN

## 2016-01-22 NOTE — Telephone Encounter (Signed)
Patient's wife called about Hydrocodone prescription.  They have taken the prescription to [facility] and they will not fill for 6 more days.  She states patient has 10 more pills left which will last him 2-1/2 days.  She is concerned that he will be out of medication for 3-1/2 days and would like to know what can be done.  Please advise.

## 2016-01-22 NOTE — Telephone Encounter (Signed)
Wife called and reports blood sugars have been running low, 64 this morning.  He normally takes Metformin 1000 mg bid and 30 units of insulin bid.  Since he had his CT scan last night, he did not take his PM dose of metformin and will not take again until 48 hours after CT, so metformin will not be restarted until Monday.  She would like to know what we recommend for the low readings.  Per Neysa Bonito, patient is not to take Metformin again until Monday and then should decrease dosage of insulin to 25 units bid and monitor blood sugars.  If that does not help to bring numbers up, he may decrease insulin to 20 units bid.  If any problems, they are to contact us.

## 2016-01-22 NOTE — Progress Notes (Signed)
CT scan of chest discussed in length with patient. Pt currently taking doxycycline and Omnicef. PT denies any cough or SOB at this time. We will continue to treat the pneumonia and patient has follow up appt with Dr. Louanne Skye. Pt states they are almost of out Norco pain medication and do not want to run out during the weekend. PT states they are not taking oxycodone, but are taking fentanyl 12 mcg/hr and Norco 10/325 every 4-6 hours. I discussed the importance of only taking Norco as needed and not on schedule. I will refill with #20 and pt is to follow up with Dr. Louanne Skye about pain medication.

## 2016-01-24 ENCOUNTER — Other Ambulatory Visit: Payer: Self-pay | Admitting: Family Medicine

## 2016-01-24 ENCOUNTER — Telehealth: Payer: Self-pay | Admitting: Family Medicine

## 2016-01-24 DIAGNOSIS — E119 Type 2 diabetes mellitus without complications: Secondary | ICD-10-CM | POA: Diagnosis not present

## 2016-01-24 DIAGNOSIS — I10 Essential (primary) hypertension: Secondary | ICD-10-CM | POA: Diagnosis not present

## 2016-01-24 DIAGNOSIS — S32010D Wedge compression fracture of first lumbar vertebra, subsequent encounter for fracture with routine healing: Secondary | ICD-10-CM | POA: Diagnosis not present

## 2016-01-24 DIAGNOSIS — G300 Alzheimer's disease with early onset: Secondary | ICD-10-CM | POA: Diagnosis not present

## 2016-01-24 DIAGNOSIS — M549 Dorsalgia, unspecified: Secondary | ICD-10-CM | POA: Diagnosis not present

## 2016-01-24 DIAGNOSIS — F0280 Dementia in other diseases classified elsewhere without behavioral disturbance: Secondary | ICD-10-CM | POA: Diagnosis not present

## 2016-01-24 MED ORDER — HYDROCODONE-ACETAMINOPHEN 10-325 MG PO TABS: 1.0000 | ORAL_TABLET | ORAL | 0 refills | Status: DC | PRN

## 2016-01-24 NOTE — Telephone Encounter (Signed)
Per Dr Louanne Skye - he will sign script and pt to see provider in the morning for pain mgmt and further meds.

## 2016-01-25 ENCOUNTER — Encounter: Payer: Self-pay | Admitting: Family Medicine

## 2016-01-25 ENCOUNTER — Ambulatory Visit (INDEPENDENT_AMBULATORY_CARE_PROVIDER_SITE_OTHER): Payer: Medicare Other | Admitting: Family Medicine

## 2016-01-25 VITALS — BP 99/75 | HR 78 | Temp 98.0°F

## 2016-01-25 DIAGNOSIS — J439 Emphysema, unspecified: Secondary | ICD-10-CM

## 2016-01-25 DIAGNOSIS — I739 Peripheral vascular disease, unspecified: Secondary | ICD-10-CM

## 2016-01-25 DIAGNOSIS — B370 Candidal stomatitis: Secondary | ICD-10-CM | POA: Diagnosis not present

## 2016-01-25 DIAGNOSIS — R601 Generalized edema: Secondary | ICD-10-CM | POA: Diagnosis not present

## 2016-01-25 DIAGNOSIS — Z794 Long term (current) use of insulin: Secondary | ICD-10-CM

## 2016-01-25 DIAGNOSIS — E1165 Type 2 diabetes mellitus with hyperglycemia: Secondary | ICD-10-CM | POA: Diagnosis not present

## 2016-01-25 DIAGNOSIS — I779 Disorder of arteries and arterioles, unspecified: Secondary | ICD-10-CM

## 2016-01-25 DIAGNOSIS — S32000S Wedge compression fracture of unspecified lumbar vertebra, sequela: Secondary | ICD-10-CM

## 2016-01-25 DIAGNOSIS — E160 Drug-induced hypoglycemia without coma: Secondary | ICD-10-CM

## 2016-01-25 DIAGNOSIS — R938 Abnormal findings on diagnostic imaging of other specified body structures: Secondary | ICD-10-CM

## 2016-01-25 DIAGNOSIS — E118 Type 2 diabetes mellitus with unspecified complications: Secondary | ICD-10-CM

## 2016-01-25 DIAGNOSIS — T383X5A Adverse effect of insulin and oral hypoglycemic [antidiabetic] drugs, initial encounter: Secondary | ICD-10-CM

## 2016-01-25 DIAGNOSIS — IMO0002 Reserved for concepts with insufficient information to code with codable children: Secondary | ICD-10-CM

## 2016-01-25 DIAGNOSIS — R9389 Abnormal findings on diagnostic imaging of other specified body structures: Secondary | ICD-10-CM

## 2016-01-25 DIAGNOSIS — S32000A Wedge compression fracture of unspecified lumbar vertebra, initial encounter for closed fracture: Secondary | ICD-10-CM | POA: Insufficient documentation

## 2016-01-25 MED ORDER — INSULIN GLARGINE 100 UNIT/ML SOLOSTAR PEN: 50.0000 [IU] | PEN_INJECTOR | SUBCUTANEOUS | 1 refills | Status: DC

## 2016-01-25 MED ORDER — CLOTRIMAZOLE 10 MG MT TROC: 10.0000 mg | Freq: Every day | OROMUCOSAL | 2 refills | Status: DC

## 2016-01-25 MED ORDER — HYDROCODONE-ACETAMINOPHEN 10-325 MG PO TABS: 1.0000 | ORAL_TABLET | ORAL | 0 refills | Status: DC | PRN

## 2016-01-25 NOTE — Progress Notes (Signed)
Subjective:  Patient ID: Jon [name], male    DOB: 05-18-1946  Age: 70 y.o. MRN: [id number]  CC: Back Pain (pain management, fell on 7/5, compression frx) and CAP (follow up)   HPI Jon [name] presents for Recheck of pneumonia. Had CT of the chest a few days ago. Report reviewed. Copied below.  Shows ground glass infiltrates that are nonspecific and likely not neoplstic. However they do not look particularly like infection, but are getting larger. There are no symptoms of pneumonia. Pt. Denies fever, chills, cough, dyspnea. He states that NEurosurgeon Dr. Carloyn Manner will not operate on his compression fracture until the abnormality resolves.   He suffered a compression Fracture of L1 on July 5 when he fell. Report reviewed, copied below. He has been using fentanyl patch for baseline pain and hydrocodone for breakthrough. Using for 3 weeks. Pain reduced to 5/10 by the fentanyl. Further relief by hydrocodone, but only lasts 2-3 hours.He was told he would need pain management, but needs refil of the hydrocodone in three days and fentanyl in one week.   Pt. Is a diabetic and reports low glucose. Has decreased insulin to 30 units BID, but continues to have lows in the night. Was taken off of metformin in order to have the CT chest. Since then glucose has been well controlled. Doesn't want to restart med just yet.   CT of spine showed L1 anterior compression. There has been discussion of surgical repair to help with pain relief.   During eval someone told pt. That he has a 70% carotid blockage. He and partner are skeptical as he was told that plaque was minimal when checked last year.Also developed thrush. Mouth was painful. Treated with nystatin with partial relief.   History Jon [name] has a past medical history of AAA (abdominal aortic aneurysm) ([place]); Anemia; Ataxia; Diabetes mellitus without complication ([place]); History of gallstones; Hyperlipidemia; Hypertension; and Vitamin D deficiency.   He has a  past surgical history that includes ERCP; Inguinal hernia repair; and Tonsillectomy.   His family history includes Diabetes in his brother and mother; Hip fracture in his mother; Hyperlipidemia in his brother; Hypertension in his brother; Lupus in his sister; Pulmonary embolism in his mother; Scleroderma in his sister.He reports that he quit smoking about 6 years ago. His smoking use included Cigarettes. He started smoking about 49 years ago. He has a 40.00 pack-year smoking history. He has never used smokeless tobacco. He reports that he does not drink alcohol or use drugs.    ROS Review of Systems  Constitutional: Positive for activity change (due to fx L1) and fatigue. Negative for appetite change, chills, diaphoresis, fever and unexpected weight change.  HENT: Negative for congestion, ear pain, hearing loss, postnasal drip, rhinorrhea, sore throat, tinnitus and trouble swallowing.   Eyes: Negative for photophobia, pain, discharge and redness.  Respiratory: Negative for apnea, cough, choking, chest tightness, shortness of breath, wheezing and stridor.   Cardiovascular: Positive for leg swelling (unilateral right leg edema without pain or erythema). Negative for chest pain and palpitations.  Gastrointestinal: Negative for abdominal distention, abdominal pain, blood in stool, constipation, diarrhea, nausea and vomiting.  Endocrine: Negative for cold intolerance, heat intolerance, polydipsia, polyphagia and polyuria.  Genitourinary: Negative for difficulty urinating, dysuria, enuresis, flank pain, frequency, genital sores, hematuria and urgency.  Musculoskeletal: Positive for arthralgias, back pain and myalgias. Negative for joint swelling.  Skin: Negative for color change, rash and wound.  Allergic/Immunologic: Negative for immunocompromised state.  Neurological: Negative for  dizziness, tremors, seizures, syncope, facial asymmetry, speech difficulty, weakness, light-headedness, numbness and  headaches.  Hematological: Does not bruise/bleed easily.  Psychiatric/Behavioral: Negative for agitation, behavioral problems, confusion, decreased concentration, dysphoric mood, hallucinations, sleep disturbance and suicidal ideas. The patient is not nervous/anxious and is not hyperactive.     Objective:  BP 99/75 (BP Location: Right Arm, Patient Position: Sitting, Cuff Size: Normal)   Pulse 78   Temp 98 F (36.7 C) (Oral)   SpO2 96%   BP Readings from Last 3 Encounters:  01/25/16 99/75  01/17/16 117/85  01/06/16 144/68    Wt Readings from Last 3 Encounters:  01/05/16 189 lb (85.7 kg)  11/02/15 192 lb 9.6 oz (87.4 kg)  09/28/15 190 lb 9.6 oz (86.5 kg)     Physical Exam  Constitutional: He is oriented to person, place, and time. He appears well-developed and well-nourished. No distress.  HENT:  Head: Normocephalic and atraumatic.  Right Ear: External ear normal.  Left Ear: External ear normal.  Nose: Nose normal.  Mouth/Throat: Oropharyngeal exudate (Posterior palate has small amount of grey plaque remaining) present.  Eyes: Conjunctivae and EOM are normal. Pupils are equal, round, and reactive to light.  Neck: Normal range of motion. Neck supple. No thyromegaly present.  Cardiovascular: Normal rate, regular rhythm and normal heart sounds.   No murmur heard. Pulmonary/Chest: Effort normal and breath sounds normal. No respiratory distress. He has no wheezes. He has no rales.  Abdominal: Soft. Bowel sounds are normal. He exhibits no distension. There is no tenderness.  Musculoskeletal: He exhibits tenderness (midline upper lumbar region for percussion). He exhibits no deformity.  Lymphadenopathy:    He has no cervical adenopathy.  Neurological: He is alert and oriented to person, place, and time. He has normal reflexes.  Skin: Skin is warm and dry. No rash noted. No erythema.  Psychiatric: He has a normal mood and affect. His behavior is normal. Judgment and thought content  normal.     Lab Results  Component Value Date   WBC 10.8 02/06/2013   HGB 15.4 02/06/2013   HCT 43.9 02/06/2013   PLT 202 02/06/2013   GLUCOSE 201 (H) 11/02/2015   CHOL 185 11/02/2015   TRIG 211 (H) 11/02/2015   HDL 44 11/02/2015   LDLCALC 99 11/02/2015   ALT 38 11/02/2015   AST 30 11/02/2015   NA 133 (L) 11/02/2015   K 4.1 11/02/2015   CL 95 (L) 11/02/2015   CREATININE 0.88 11/02/2015   BUN 17 11/02/2015   CO2 20 11/02/2015   TSH 2.680 02/06/2013   HGBA1C 7.4 07/14/2015    Dg Lumbar Spine Complete  Result Date: 01/06/2016 CLINICAL DATA:  Fall yesterday with lumbosacral back pain localized in the upper lumbar spine midline. EXAM: LUMBAR SPINE - COMPLETE 4+ VIEW COMPARISON:  None. FINDINGS: Mild compression deformity of L1 vertebral body with 20-30% loss of height central and anteriorly. Possible buckling of the posterior cortex. Remaining lumbar vertebral body heights are maintained. No additional acute fracture. There is facet arthropathy in the lower lumbar spine. IMPRESSION: Mild L1 compression deformity, likely acute. Electronically Signed   By: Jeb Levering M.D.   On: 01/06/2016 00:32   Ct Lumbar Spine Wo Contrast  Result Date: 01/06/2016 CLINICAL DATA:  Status post fall. Assess fracture of L1, noted on radiograph. Initial encounter. EXAM: CT LUMBAR SPINE WITHOUT CONTRAST TECHNIQUE: Multidetector CT imaging of the lumbar spine was performed without intravenous contrast administration. Multiplanar CT image reconstructions were also generated. COMPARISON:  Lumbar  spine radiographs performed 01/05/2016 FINDINGS: There is an acute fracture of vertebral body L1, with approximately 55% loss of height. The fracture extends through the anterior aspect of the vertebral body. The posterior portion of the vertebral body appears intact. There is no evidence of extension to the posterior elements or retropulsion. No additional fractures are seen. Intervertebral disc spaces are preserved.  Mild vacuum phenomenon is noted at L5-S1. Circumferential disc bulges are seen at L3-L4, L4-L5 and L5-S1. There is no evidence of impression on exiting nerve roots. Scattered calcification is seen along the abdominal aorta and its branches. Mild nonspecific perinephric stranding is noted bilaterally. The paraspinal musculature is unremarkable in appearance. IMPRESSION: 1. Acute fracture involving the anterior half of vertebral body L1, with approximately 55% loss of height. No evidence of involvement of the posterior elements. No evidence of retropulsion. 2. Scattered calcification along the abdominal aorta and its branches. Electronically Signed   By: Garald Balding M.D.   On: 01/06/2016 02:18    Assessment & Plan:   Antwine was seen today for back pain and cap.  Diagnoses and all orders for this visit:  Generalized edema -     Ultrasound doppler venous legs bilat  Bilateral carotid artery disease (HCC) -     Ambulatory referral to Vascular Surgery  Abnormal chest CT -     Ambulatory referral to Pulmonology  Lumbar compression fracture, sequela  Thrush, oral  Uncontrolled type 2 diabetes mellitus with complication, with long-term current use of insulin (HCC)  Hypoglycemia due to insulin  Other orders -     clotrimazole (MYCELEX) 10 MG troche; Take 1 tablet (10 mg total) by mouth 5 (five) times daily. For yeast in mouth and throat -     Insulin Glargine (LANTUS SOLOSTAR) 100 UNIT/ML Solostar Pen; Inject 50 Units into the skin every morning. -     HYDROcodone-acetaminophen (NORCO) 10-325 MG tablet; Take 1 tablet by mouth every 4 (four) hours as needed.    I have changed Mr. Kock Insulin Glargine. I am also having him start on clotrimazole. Additionally, I am having him maintain his aspirin, glucose blood, freestyle, glucose monitoring kit, metFORMIN, SURE COMFORT PEN NEEDLES, cefdinir, doxycycline, fentaNYL, ADVAIR DISKUS, donepezil, LORazepam, SPIRIVA HANDIHALER, nystatin,  omeprazole, levalbuterol, and HYDROcodone-acetaminophen.  Meds ordered this encounter  Medications  . levalbuterol (XOPENEX) 1.25 MG/3ML nebulizer solution    Sig: Inhale 1 ampule into the lungs 4 (four) times daily.  . clotrimazole (MYCELEX) 10 MG troche    Sig: Take 1 tablet (10 mg total) by mouth 5 (five) times daily. For yeast in mouth and throat    Dispense:  35 tablet    Refill:  2  . Insulin Glargine (LANTUS SOLOSTAR) 100 UNIT/ML Solostar Pen    Sig: Inject 50 Units into the skin every morning.    Dispense:  50 mL    Refill:  1  . HYDROcodone-acetaminophen (NORCO) 10-325 MG tablet    Sig: Take 1 tablet by mouth every 4 (four) hours as needed.    Dispense:  50 tablet    Refill:  0   OVer 1 hour was spent with pt, over 1/2 in counseling regarding above dx. DM diet, peak of Lantus, Differentiating acute from chronic pain & treatment of each.  Follow-up: Return in about 7 days (around 02/01/2016) for vertebral fx, diabetes.  Claretta Fraise, M.D.

## 2016-01-26 DIAGNOSIS — E119 Type 2 diabetes mellitus without complications: Secondary | ICD-10-CM | POA: Diagnosis not present

## 2016-01-26 DIAGNOSIS — H40033 Anatomical narrow angle, bilateral: Secondary | ICD-10-CM | POA: Diagnosis not present

## 2016-01-27 DIAGNOSIS — S32010D Wedge compression fracture of first lumbar vertebra, subsequent encounter for fracture with routine healing: Secondary | ICD-10-CM | POA: Diagnosis not present

## 2016-01-27 DIAGNOSIS — I10 Essential (primary) hypertension: Secondary | ICD-10-CM | POA: Diagnosis not present

## 2016-01-27 DIAGNOSIS — G300 Alzheimer's disease with early onset: Secondary | ICD-10-CM | POA: Diagnosis not present

## 2016-01-27 DIAGNOSIS — F0280 Dementia in other diseases classified elsewhere without behavioral disturbance: Secondary | ICD-10-CM | POA: Diagnosis not present

## 2016-01-27 DIAGNOSIS — M549 Dorsalgia, unspecified: Secondary | ICD-10-CM | POA: Diagnosis not present

## 2016-01-27 DIAGNOSIS — E119 Type 2 diabetes mellitus without complications: Secondary | ICD-10-CM | POA: Diagnosis not present

## 2016-01-28 DIAGNOSIS — F0280 Dementia in other diseases classified elsewhere without behavioral disturbance: Secondary | ICD-10-CM | POA: Diagnosis not present

## 2016-01-28 DIAGNOSIS — S32010D Wedge compression fracture of first lumbar vertebra, subsequent encounter for fracture with routine healing: Secondary | ICD-10-CM | POA: Diagnosis not present

## 2016-01-28 DIAGNOSIS — G300 Alzheimer's disease with early onset: Secondary | ICD-10-CM | POA: Diagnosis not present

## 2016-01-28 DIAGNOSIS — E119 Type 2 diabetes mellitus without complications: Secondary | ICD-10-CM | POA: Diagnosis not present

## 2016-01-28 DIAGNOSIS — I10 Essential (primary) hypertension: Secondary | ICD-10-CM | POA: Diagnosis not present

## 2016-01-28 DIAGNOSIS — M549 Dorsalgia, unspecified: Secondary | ICD-10-CM | POA: Diagnosis not present

## 2016-01-31 ENCOUNTER — Ambulatory Visit: Payer: Medicare Other | Admitting: Family Medicine

## 2016-01-31 ENCOUNTER — Ambulatory Visit (INDEPENDENT_AMBULATORY_CARE_PROVIDER_SITE_OTHER): Payer: Medicare Other | Admitting: Family Medicine

## 2016-01-31 DIAGNOSIS — G300 Alzheimer's disease with early onset: Secondary | ICD-10-CM | POA: Diagnosis not present

## 2016-01-31 DIAGNOSIS — F0280 Dementia in other diseases classified elsewhere without behavioral disturbance: Secondary | ICD-10-CM

## 2016-01-31 DIAGNOSIS — S32010D Wedge compression fracture of first lumbar vertebra, subsequent encounter for fracture with routine healing: Secondary | ICD-10-CM

## 2016-01-31 DIAGNOSIS — M549 Dorsalgia, unspecified: Secondary | ICD-10-CM | POA: Diagnosis not present

## 2016-01-31 DIAGNOSIS — E119 Type 2 diabetes mellitus without complications: Secondary | ICD-10-CM | POA: Diagnosis not present

## 2016-01-31 DIAGNOSIS — I10 Essential (primary) hypertension: Secondary | ICD-10-CM | POA: Diagnosis not present

## 2016-02-01 DIAGNOSIS — I719 Aortic aneurysm of unspecified site, without rupture: Secondary | ICD-10-CM | POA: Diagnosis not present

## 2016-02-01 DIAGNOSIS — I6523 Occlusion and stenosis of bilateral carotid arteries: Secondary | ICD-10-CM | POA: Diagnosis not present

## 2016-02-01 DIAGNOSIS — I714 Abdominal aortic aneurysm, without rupture: Secondary | ICD-10-CM | POA: Diagnosis not present

## 2016-02-01 DIAGNOSIS — R600 Localized edema: Secondary | ICD-10-CM | POA: Insufficient documentation

## 2016-02-01 DIAGNOSIS — I779 Disorder of arteries and arterioles, unspecified: Secondary | ICD-10-CM | POA: Diagnosis not present

## 2016-02-02 ENCOUNTER — Ambulatory Visit (INDEPENDENT_AMBULATORY_CARE_PROVIDER_SITE_OTHER): Payer: Medicare Other

## 2016-02-02 ENCOUNTER — Encounter: Payer: Self-pay | Admitting: Family Medicine

## 2016-02-02 ENCOUNTER — Ambulatory Visit (INDEPENDENT_AMBULATORY_CARE_PROVIDER_SITE_OTHER): Payer: Medicare Other | Admitting: Family Medicine

## 2016-02-02 VITALS — BP 92/59 | HR 79 | Temp 97.2°F | Ht 73.0 in | Wt 189.0 lb

## 2016-02-02 DIAGNOSIS — R918 Other nonspecific abnormal finding of lung field: Secondary | ICD-10-CM | POA: Diagnosis not present

## 2016-02-02 DIAGNOSIS — E039 Hypothyroidism, unspecified: Secondary | ICD-10-CM

## 2016-02-02 DIAGNOSIS — E270 Other adrenocortical overactivity: Secondary | ICD-10-CM | POA: Diagnosis not present

## 2016-02-02 DIAGNOSIS — S32000D Wedge compression fracture of unspecified lumbar vertebra, subsequent encounter for fracture with routine healing: Secondary | ICD-10-CM | POA: Diagnosis not present

## 2016-02-02 DIAGNOSIS — IMO0002 Reserved for concepts with insufficient information to code with codable children: Secondary | ICD-10-CM

## 2016-02-02 DIAGNOSIS — R938 Abnormal findings on diagnostic imaging of other specified body structures: Secondary | ICD-10-CM | POA: Diagnosis not present

## 2016-02-02 DIAGNOSIS — J449 Chronic obstructive pulmonary disease, unspecified: Secondary | ICD-10-CM | POA: Diagnosis not present

## 2016-02-02 DIAGNOSIS — S32000S Wedge compression fracture of unspecified lumbar vertebra, sequela: Secondary | ICD-10-CM

## 2016-02-02 DIAGNOSIS — E160 Drug-induced hypoglycemia without coma: Secondary | ICD-10-CM

## 2016-02-02 DIAGNOSIS — T383X5A Adverse effect of insulin and oral hypoglycemic [antidiabetic] drugs, initial encounter: Secondary | ICD-10-CM | POA: Diagnosis not present

## 2016-02-02 DIAGNOSIS — E118 Type 2 diabetes mellitus with unspecified complications: Secondary | ICD-10-CM | POA: Diagnosis not present

## 2016-02-02 DIAGNOSIS — M545 Low back pain: Secondary | ICD-10-CM | POA: Diagnosis not present

## 2016-02-02 DIAGNOSIS — R683 Clubbing of fingers: Secondary | ICD-10-CM | POA: Diagnosis not present

## 2016-02-02 DIAGNOSIS — J439 Emphysema, unspecified: Secondary | ICD-10-CM

## 2016-02-02 DIAGNOSIS — J189 Pneumonia, unspecified organism: Secondary | ICD-10-CM

## 2016-02-02 DIAGNOSIS — R7989 Other specified abnormal findings of blood chemistry: Secondary | ICD-10-CM

## 2016-02-02 DIAGNOSIS — Z794 Long term (current) use of insulin: Secondary | ICD-10-CM | POA: Diagnosis not present

## 2016-02-02 DIAGNOSIS — F068 Other specified mental disorders due to known physiological condition: Secondary | ICD-10-CM | POA: Diagnosis not present

## 2016-02-02 DIAGNOSIS — I10 Essential (primary) hypertension: Secondary | ICD-10-CM | POA: Diagnosis not present

## 2016-02-02 DIAGNOSIS — E1165 Type 2 diabetes mellitus with hyperglycemia: Secondary | ICD-10-CM | POA: Diagnosis not present

## 2016-02-02 MED ORDER — GLUCOSE BLOOD VI STRP: ORAL_STRIP | 1 refills | Status: DC

## 2016-02-02 MED ORDER — HYDROCODONE-ACETAMINOPHEN 10-325 MG PO TABS: 1.0000 | ORAL_TABLET | Freq: Four times a day (QID) | ORAL | 0 refills | Status: DC | PRN

## 2016-02-02 NOTE — Progress Notes (Signed)
Subjective:  Patient ID: Jon [name], male    DOB: Jan 25, 1946  Age: 70 y.o. MRN: [id number]  CC: Vertebral Fracture (1 wk rck)   HPI Jon [name] presents for Recheck of pneumonia. Had CT of the chest a few days ago. Report reviewed. Copied below.  Shows ground glass infiltrates that are nonspecific and likely not neoplstic. However they do not look particularly like infection, but are getting larger. There are no symptoms of pneumonia. Pt. Denies fever, chills, cough, dyspnea. He states that NEurosurgeon Dr. Carloyn Manner will not operate on his compression fracture until the abnormality resolves.   He suffered a compression Fracture of L1 on July 5 when he fell. Report reviewed, copied below. He has Discontinued using fentanyl patch for baseline pain. He continues to use hydrocodone for breakthrough.   Pt. Is a diabetic and reports the hypoglycemic spells have discontinued. He has had no further nighttime hypoglycemic episodes.  CT of spine showed L1 anterior compression. There has been discussion of surgical repair to help with pain relief. He saw pulmonology earlier today. I had a telephone conversation with Dr. Luan Pulling. He believes there is an atypical infection present. He started the patient on a course of levofloxacin. Additionally he felt that checking the cortisol level would be useful. He also suggested thyroid be checked.  During eval someone told pt. That he has a 70% carotid blockage. He saw vascular yesterday and they rated the blockage as 10% instead. The AAA is stable at 3 cm.  He has had some cold intolerance. Energy has certainly been poor. He has had constipation and nonpitting edema as well. Therefore she is in need of a check for thyroid disease. History Jon [name] has a past medical history of AAA (abdominal aortic aneurysm) ([place]); Anemia; Ataxia; Diabetes mellitus without complication ([place]); History of gallstones; Hyperlipidemia; Hypertension; and Vitamin D deficiency.   He has a  past surgical history that includes ERCP; Inguinal hernia repair; and Tonsillectomy.   His family history includes Diabetes in his brother and mother; Hip fracture in his mother; Hyperlipidemia in his brother; Hypertension in his brother; Lupus in his sister; Pulmonary embolism in his mother; Scleroderma in his sister.He reports that he quit smoking about 6 years ago. His smoking use included Cigarettes. He started smoking about 49 years ago. He has a 40.00 pack-year smoking history. He has never used smokeless tobacco. He reports that he does not drink alcohol or use drugs.    ROS Review of Systems  Constitutional: Positive for activity change (due to fx L1) and fatigue. Negative for appetite change, chills, diaphoresis, fever and unexpected weight change.  HENT: Negative for congestion, ear pain, hearing loss, postnasal drip, rhinorrhea, sore throat, tinnitus and trouble swallowing.   Eyes: Negative for photophobia, pain, discharge and redness.  Respiratory: Negative for apnea, cough, choking, chest tightness, shortness of breath, wheezing and stridor.   Cardiovascular: Positive for leg swelling (unilateral right leg edema without pain or erythema). Negative for chest pain and palpitations.  Gastrointestinal: Negative for abdominal distention, abdominal pain, blood in stool, constipation, diarrhea, nausea and vomiting.  Endocrine: Negative for cold intolerance, heat intolerance, polydipsia, polyphagia and polyuria.  Genitourinary: Negative for difficulty urinating, dysuria, enuresis, flank pain, frequency, genital sores, hematuria and urgency.  Musculoskeletal: Positive for arthralgias, back pain and myalgias. Negative for joint swelling.  Skin: Negative for color change, rash and wound.  Allergic/Immunologic: Negative for immunocompromised state.  Neurological: Negative for dizziness, tremors, seizures, syncope, facial asymmetry, speech difficulty, weakness, light-headedness,  numbness and  headaches.  Hematological: Does not bruise/bleed easily.  Psychiatric/Behavioral: Negative for agitation, behavioral problems, confusion, decreased concentration, dysphoric mood, hallucinations, sleep disturbance and suicidal ideas. The patient is not nervous/anxious and is not hyperactive.     Objective:  BP (!) 92/59 (BP Location: Left Arm, Patient Position: Sitting, Cuff Size: Large)   Pulse 79   Temp 97.2 F (36.2 C) (Oral)   Ht '6\' 1"'  (1.854 m)   Wt 189 lb (85.7 kg)   SpO2 95%   BMI 24.94 kg/m   BP Readings from Last 3 Encounters:  02/02/16 (!) 92/59  01/25/16 99/75  01/17/16 117/85    Wt Readings from Last 3 Encounters:  02/02/16 189 lb (85.7 kg)  01/05/16 189 lb (85.7 kg)  11/02/15 192 lb 9.6 oz (87.4 kg)     Physical Exam  Constitutional: He is oriented to person, place, and time. He appears well-developed and well-nourished. No distress.  HENT:  Head: Normocephalic and atraumatic.  Right Ear: External ear normal.  Left Ear: External ear normal.  Nose: Nose normal.  Mouth/Throat: Oropharyngeal exudate (Posterior palate has small amount of grey plaque remaining) present.  Eyes: Conjunctivae and EOM are normal. Pupils are equal, round, and reactive to light.  Neck: Normal range of motion. Neck supple. No thyromegaly present.  Cardiovascular: Normal rate, regular rhythm and normal heart sounds.   No murmur heard. Pulmonary/Chest: Effort normal and breath sounds normal. No respiratory distress. He has no wheezes. He has no rales.  Abdominal: Soft. Bowel sounds are normal. He exhibits no distension. There is no tenderness.  Musculoskeletal: He exhibits tenderness (midline upper lumbar region for percussion). He exhibits no deformity.  Lymphadenopathy:    He has no cervical adenopathy.  Neurological: He is alert and oriented to person, place, and time. He has normal reflexes.  Skin: Skin is warm and dry. No rash noted. No erythema.  Psychiatric: He has a normal mood  and affect. His behavior is normal. Judgment and thought content normal.     Lab Results  Component Value Date   WBC 10.8 02/06/2013   HGB 15.4 02/06/2013   HCT 43.9 02/06/2013   PLT 202 02/06/2013   GLUCOSE 197 (H) 02/02/2016   CHOL 185 11/02/2015   TRIG 211 (H) 11/02/2015   HDL 44 11/02/2015   LDLCALC 99 11/02/2015   ALT 107 (H) 02/02/2016   AST 67 (H) 02/02/2016   NA 138 02/02/2016   K 4.1 02/02/2016   CL 100 02/02/2016   CREATININE 1.06 02/02/2016   BUN 17 02/02/2016   CO2 23 02/02/2016   TSH 5.680 (H) 02/02/2016   HGBA1C 7.4 07/14/2015    Dg Lumbar Spine Complete  Result Date: 01/06/2016 CLINICAL DATA:  Fall yesterday with lumbosacral back pain localized in the upper lumbar spine midline. EXAM: LUMBAR SPINE - COMPLETE 4+ VIEW COMPARISON:  None. FINDINGS: Mild compression deformity of L1 vertebral body with 20-30% loss of height central and anteriorly. Possible buckling of the posterior cortex. Remaining lumbar vertebral body heights are maintained. No additional acute fracture. There is facet arthropathy in the lower lumbar spine. IMPRESSION: Mild L1 compression deformity, likely acute. Electronically Signed   By: Jeb Levering M.D.   On: 01/06/2016 00:32   Ct Lumbar Spine Wo Contrast  Result Date: 01/06/2016 CLINICAL DATA:  Status post fall. Assess fracture of L1, noted on radiograph. Initial encounter. EXAM: CT LUMBAR SPINE WITHOUT CONTRAST TECHNIQUE: Multidetector CT imaging of the lumbar spine was performed without intravenous contrast administration.  Multiplanar CT image reconstructions were also generated. COMPARISON:  Lumbar spine radiographs performed 01/05/2016 FINDINGS: There is an acute fracture of vertebral body L1, with approximately 55% loss of height. The fracture extends through the anterior aspect of the vertebral body. The posterior portion of the vertebral body appears intact. There is no evidence of extension to the posterior elements or retropulsion. No  additional fractures are seen. Intervertebral disc spaces are preserved. Mild vacuum phenomenon is noted at L5-S1. Circumferential disc bulges are seen at L3-L4, L4-L5 and L5-S1. There is no evidence of impression on exiting nerve roots. Scattered calcification is seen along the abdominal aorta and its branches. Mild nonspecific perinephric stranding is noted bilaterally. The paraspinal musculature is unremarkable in appearance. IMPRESSION: 1. Acute fracture involving the anterior half of vertebral body L1, with approximately 55% loss of height. No evidence of involvement of the posterior elements. No evidence of retropulsion. 2. Scattered calcification along the abdominal aorta and its branches. Electronically Signed   By: Garald Balding M.D.   On: 01/06/2016 02:18    Assessment & Plan:   Jon [name] was seen today for vertebral fracture.  Diagnoses and all orders for this visit:  Uncontrolled type 2 diabetes mellitus with complication, with long-term current use of insulin ([place])  Lung mass -     DG Chest 2 View; Future -     CMP14+EGFR -     TSH + free T4 -     Cortisol-pm, blood -     Cancel: VMA and HVA, 24 hour urine -     HIV antibody -     Homovanillic Acid, 53-ZJ Urine; Future -     Vanillylmandelic Acid, 67-HA U  Lumbar compression fracture, sequela  Hypoglycemia due to insulin  CAP (community acquired pneumonia)  Essential hypertension  Bullous emphysema (HCC)  Hypothyroidism, unspecified hypothyroidism type  Other orders -     HYDROcodone-acetaminophen (NORCO) 10-325 MG tablet; Take 1 tablet by mouth 4 (four) times daily as needed for moderate pain or severe pain. -     glucose blood (FREESTYLE LITE) test strip; Use to check BG up to three times daily (Dx: E11.8, E11.65, Z79.4 - type 2 DM with complications and long term insulin use) -     levothyroxine (SYNTHROID, LEVOTHROID) 25 MCG tablet; Take 1 tablet (25 mcg total) by mouth daily before breakfast. On an empty stomach.  Weight 1 hour after taking the medication before eating breakfast  Chest x-ray shows continued infiltrate unchanged. However he is just starting his course of Levaquin today based on seeing Dr. Luan Pulling of pulmonology today. Based on that conversation I will also be checking his adrenal glands, his cortisol level and VMA HVA..  Labs showed that his cortisol level is moderately elevated. This will be monitored currently. Should it not normalize as his various conditions improve we'll consider treatment at that time.  I have discontinued Jon [name] cefdinir, doxycycline, and fentaNYL. I have also changed his HYDROcodone-acetaminophen and glucose blood. Additionally, I am having him start on levothyroxine. Lastly, I am having him maintain his aspirin, freestyle, glucose monitoring kit, metFORMIN, SURE COMFORT PEN NEEDLES, ADVAIR DISKUS, donepezil, LORazepam, SPIRIVA HANDIHALER, nystatin, omeprazole, levalbuterol, clotrimazole, and Insulin Glargine.  Meds ordered this encounter  Medications  . HYDROcodone-acetaminophen (NORCO) 10-325 MG tablet    Sig: Take 1 tablet by mouth 4 (four) times daily as needed for moderate pain or severe pain.    Dispense:  120 tablet    Refill:  0  . glucose  blood (FREESTYLE LITE) test strip    Sig: Use to check BG up to three times daily (Dx: E11.8, E11.65, Z79.4 - type 2 DM with complications and long term insulin use)    Dispense:  200 each    Refill:  1  . levothyroxine (SYNTHROID, LEVOTHROID) 25 MCG tablet    Sig: Take 1 tablet (25 mcg total) by mouth daily before breakfast. On an empty stomach. Weight 1 hour after taking the medication before eating breakfast    Dispense:  30 tablet    Refill:  1    Follow-up: Return in about 2 weeks (around 02/16/2016).  Claretta Fraise, M.D.

## 2016-02-04 ENCOUNTER — Other Ambulatory Visit: Payer: Self-pay | Admitting: Family Medicine

## 2016-02-04 ENCOUNTER — Telehealth: Payer: Self-pay | Admitting: Family Medicine

## 2016-02-04 DIAGNOSIS — E119 Type 2 diabetes mellitus without complications: Secondary | ICD-10-CM | POA: Diagnosis not present

## 2016-02-04 DIAGNOSIS — F0280 Dementia in other diseases classified elsewhere without behavioral disturbance: Secondary | ICD-10-CM | POA: Diagnosis not present

## 2016-02-04 DIAGNOSIS — S32010D Wedge compression fracture of first lumbar vertebra, subsequent encounter for fracture with routine healing: Secondary | ICD-10-CM | POA: Diagnosis not present

## 2016-02-04 DIAGNOSIS — G300 Alzheimer's disease with early onset: Secondary | ICD-10-CM | POA: Diagnosis not present

## 2016-02-04 DIAGNOSIS — R748 Abnormal levels of other serum enzymes: Secondary | ICD-10-CM

## 2016-02-04 DIAGNOSIS — M549 Dorsalgia, unspecified: Secondary | ICD-10-CM | POA: Diagnosis not present

## 2016-02-04 DIAGNOSIS — I10 Essential (primary) hypertension: Secondary | ICD-10-CM | POA: Diagnosis not present

## 2016-02-04 LAB — CMP14+EGFR
ALT: 107 IU/L — AB (ref 0–44)
AST: 67 IU/L — AB (ref 0–40)
Albumin/Globulin Ratio: 1.2 (ref 1.2–2.2)
Albumin: 4 g/dL (ref 3.6–4.8)
Alkaline Phosphatase: 393 IU/L — ABNORMAL HIGH (ref 39–117)
BUN/Creatinine Ratio: 16 (ref 10–24)
BUN: 17 mg/dL (ref 8–27)
Bilirubin Total: 0.3 mg/dL (ref 0.0–1.2)
CALCIUM: 9.1 mg/dL (ref 8.6–10.2)
CO2: 23 mmol/L (ref 18–29)
CREATININE: 1.06 mg/dL (ref 0.76–1.27)
Chloride: 100 mmol/L (ref 96–106)
GFR calc Af Amer: 83 mL/min/{1.73_m2} (ref >59)
GFR, EST NON AFRICAN AMERICAN: 72 mL/min/{1.73_m2} (ref >59)
GLOBULIN, TOTAL: 3.3 g/dL (ref 1.5–4.5)
Glucose: 197 mg/dL — ABNORMAL HIGH (ref 65–99)
Potassium: 4.1 mmol/L (ref 3.5–5.2)
SODIUM: 138 mmol/L (ref 134–144)
Total Protein: 7.3 g/dL (ref 6.0–8.5)

## 2016-02-04 LAB — TSH+FREE T4
FREE T4: 0.97 ng/dL (ref 0.82–1.77)
TSH: 5.68 u[IU]/mL — ABNORMAL HIGH (ref 0.450–4.500)

## 2016-02-04 LAB — HIV ANTIBODY (ROUTINE TESTING W REFLEX): HIV Screen 4th Generation wRfx: NONREACTIVE

## 2016-02-04 LAB — CORTISOL-PM, BLOOD: CORTISOL - PM: 15.7 ug/dL — AB (ref 2.3–11.9)

## 2016-02-04 MED ORDER — LEVOTHYROXINE SODIUM 25 MCG PO TABS: 25.0000 ug | ORAL_TABLET | Freq: Every day | ORAL | 1 refills | Status: DC

## 2016-02-04 NOTE — Telephone Encounter (Signed)
Spoke with pt's wife regarding previous diagnosis of htn She states pt has never had hypertension Will discuss with Dr at next appt

## 2016-02-07 ENCOUNTER — Telehealth: Payer: Self-pay | Admitting: Family Medicine

## 2016-02-07 DIAGNOSIS — Z794 Long term (current) use of insulin: Secondary | ICD-10-CM | POA: Diagnosis not present

## 2016-02-07 DIAGNOSIS — E1165 Type 2 diabetes mellitus with hyperglycemia: Secondary | ICD-10-CM | POA: Diagnosis not present

## 2016-02-07 DIAGNOSIS — E118 Type 2 diabetes mellitus with unspecified complications: Secondary | ICD-10-CM | POA: Diagnosis not present

## 2016-02-07 NOTE — Telephone Encounter (Signed)
Pt's wife notified of appt and RX Verbalizes understanding

## 2016-02-08 DIAGNOSIS — G300 Alzheimer's disease with early onset: Secondary | ICD-10-CM | POA: Diagnosis not present

## 2016-02-08 DIAGNOSIS — F0280 Dementia in other diseases classified elsewhere without behavioral disturbance: Secondary | ICD-10-CM | POA: Diagnosis not present

## 2016-02-08 DIAGNOSIS — S32010D Wedge compression fracture of first lumbar vertebra, subsequent encounter for fracture with routine healing: Secondary | ICD-10-CM | POA: Diagnosis not present

## 2016-02-08 DIAGNOSIS — I10 Essential (primary) hypertension: Secondary | ICD-10-CM | POA: Diagnosis not present

## 2016-02-08 DIAGNOSIS — M549 Dorsalgia, unspecified: Secondary | ICD-10-CM | POA: Diagnosis not present

## 2016-02-08 DIAGNOSIS — E119 Type 2 diabetes mellitus without complications: Secondary | ICD-10-CM | POA: Diagnosis not present

## 2016-02-08 LAB — BAYER DCA HB A1C WAIVED: HB A1C: 8.1 % — AB (ref <7.0)

## 2016-02-08 LAB — SPECIMEN STATUS REPORT

## 2016-02-08 LAB — HEPATITIS PANEL, ACUTE
HEP A IGM: NEGATIVE
HEP B C IGM: NEGATIVE
HEP B S AG: NEGATIVE
Hep C Virus Ab: <0.1 s/co ratio (ref 0.0–0.9)

## 2016-02-08 NOTE — Addendum Note (Signed)
Addended by: Margorie JohnJOHNSON, Kaelon Weekes M on: 02/08/2016 10:23 AM   Modules accepted: Orders

## 2016-02-08 NOTE — Addendum Note (Signed)
Addended by: Bearl MulberryUTHERFORD, NATALIE K on: 02/08/2016 10:16 AM   Modules accepted: Orders

## 2016-02-09 ENCOUNTER — Telehealth: Payer: Self-pay

## 2016-02-09 ENCOUNTER — Ambulatory Visit (HOSPITAL_COMMUNITY)
Admission: RE | Admit: 2016-02-09 | Discharge: 2016-02-09 | Disposition: A | Discharge status: Home / Self Care | Payer: Medicare Other | Source: Ambulatory Visit | Attending: Family Medicine | Admitting: Family Medicine

## 2016-02-09 DIAGNOSIS — K8020 Calculus of gallbladder without cholecystitis without obstruction: Secondary | ICD-10-CM | POA: Insufficient documentation

## 2016-02-09 DIAGNOSIS — I714 Abdominal aortic aneurysm, without rupture: Secondary | ICD-10-CM | POA: Insufficient documentation

## 2016-02-09 DIAGNOSIS — R748 Abnormal levels of other serum enzymes: Secondary | ICD-10-CM | POA: Insufficient documentation

## 2016-02-09 NOTE — Telephone Encounter (Signed)
Called and discussed results with pt and his wife. They have f/u appt with Dr. Darlyn ReadStacks on Monday.

## 2016-02-09 NOTE — Telephone Encounter (Signed)
Patient had an ultrasound this AM and would like for you to review results. See Stacks. Please advise and route to pool B

## 2016-02-10 DIAGNOSIS — M549 Dorsalgia, unspecified: Secondary | ICD-10-CM | POA: Diagnosis not present

## 2016-02-10 DIAGNOSIS — S32010D Wedge compression fracture of first lumbar vertebra, subsequent encounter for fracture with routine healing: Secondary | ICD-10-CM | POA: Diagnosis not present

## 2016-02-10 DIAGNOSIS — I10 Essential (primary) hypertension: Secondary | ICD-10-CM | POA: Diagnosis not present

## 2016-02-10 DIAGNOSIS — F0280 Dementia in other diseases classified elsewhere without behavioral disturbance: Secondary | ICD-10-CM | POA: Diagnosis not present

## 2016-02-10 DIAGNOSIS — E119 Type 2 diabetes mellitus without complications: Secondary | ICD-10-CM | POA: Diagnosis not present

## 2016-02-10 DIAGNOSIS — G300 Alzheimer's disease with early onset: Secondary | ICD-10-CM | POA: Diagnosis not present

## 2016-02-14 ENCOUNTER — Telehealth: Payer: Self-pay | Admitting: Family Medicine

## 2016-02-14 ENCOUNTER — Ambulatory Visit (INDEPENDENT_AMBULATORY_CARE_PROVIDER_SITE_OTHER): Payer: Medicare Other

## 2016-02-14 ENCOUNTER — Encounter: Payer: Self-pay | Admitting: Family Medicine

## 2016-02-14 ENCOUNTER — Ambulatory Visit (INDEPENDENT_AMBULATORY_CARE_PROVIDER_SITE_OTHER): Payer: Medicare Other | Admitting: Family Medicine

## 2016-02-14 VITALS — BP 91/57 | HR 86 | Temp 97.9°F | Ht 73.0 in | Wt 176.0 lb

## 2016-02-14 DIAGNOSIS — J439 Emphysema, unspecified: Secondary | ICD-10-CM

## 2016-02-14 DIAGNOSIS — J189 Pneumonia, unspecified organism: Secondary | ICD-10-CM

## 2016-02-14 DIAGNOSIS — Z794 Long term (current) use of insulin: Secondary | ICD-10-CM

## 2016-02-14 DIAGNOSIS — E1165 Type 2 diabetes mellitus with hyperglycemia: Secondary | ICD-10-CM

## 2016-02-14 DIAGNOSIS — S32000D Wedge compression fracture of unspecified lumbar vertebra, subsequent encounter for fracture with routine healing: Secondary | ICD-10-CM

## 2016-02-14 DIAGNOSIS — IMO0002 Reserved for concepts with insufficient information to code with codable children: Secondary | ICD-10-CM

## 2016-02-14 DIAGNOSIS — E118 Type 2 diabetes mellitus with unspecified complications: Secondary | ICD-10-CM | POA: Diagnosis not present

## 2016-02-14 NOTE — Progress Notes (Signed)
Subjective:  Patient ID: Jon [name], male    DOB: [birth year]  Age: 70 y.o. MRN: [id number]  CC: CAP (2 wk rck)   HPI GODWIN TEDESCO presents for Patient has no cough he's not short of breath. Therefore he is discontinued the Advair and Spiriva. Additionally he has been taking less of the hydrocodone. He is down to 2 or 3 a day because the pain although severe is somewhat improved. He has appointment to follow up with Dr. Luan Pulling of pulmonary in a couple of days.. At that time a decision will be made [place] to whether he can undergo surgery. However he is questioning whether he will need the surgery since the pain is improving. His blood sugar readings were brought in today showing multiple readings through the day and fasting. These are running in the low 100s. He denies any frequency/polyuria or polydipsia. No nausea or dizziness. No low sugar spells/hypoglycemia. History Blayke has a past medical history of AAA (abdominal aortic aneurysm) ([place]); Anemia; Ataxia; Diabetes mellitus without complication ([place]); History of gallstones; Hyperlipidemia; Hypertension; and Vitamin D deficiency.   He has a past surgical history that includes ERCP; Inguinal hernia repair; and Tonsillectomy.   His family history includes Diabetes in his brother and mother; Hip fracture in his mother; Hyperlipidemia in his brother; Hypertension in his brother; Lupus in his sister; Pulmonary embolism in his mother; Scleroderma in his sister.He reports that he quit smoking about 7 years ago. His smoking use included Cigarettes. He started smoking about 49 years ago. He has a 40.00 pack-year smoking history. He has never used smokeless tobacco. He reports that he does not drink alcohol or use drugs.    ROS Review of Systems  Constitutional: Negative for chills, diaphoresis and fever.  HENT: Negative for sore throat.   Cardiovascular: Negative for chest pain.  Gastrointestinal: Negative for abdominal pain.  Musculoskeletal:  Positive for arthralgias, back pain and myalgias. Negative for gait problem and neck pain.  Skin: Negative for rash.  Neurological: Positive for weakness. Negative for numbness.    Objective:  BP (!) 91/57 (BP Location: Right Arm, Patient Position: Sitting, Cuff Size: Normal)   Pulse 86   Temp 97.9 F (36.6 C) (Oral)   Ht _0  (1.854 m)   Wt 176 lb (79.8 kg)   SpO2 95%   BMI 23.22 kg/m   BP Readings from Last 3 Encounters:  02/14/16 (!) 91/57  02/02/16 (!) 92/59  01/25/16 99/75    Wt Readings from Last 3 Encounters:  02/14/16 176 lb (79.8 kg)  02/02/16 189 lb (85.7 kg)  01/05/16 189 lb (85.7 kg)     Physical Exam  Constitutional: He is oriented to person, place, and time. He appears well-developed and well-nourished. No distress.  HENT:  Head: Normocephalic.  Eyes: Pupils are equal, round, and reactive to light.  Neck: Normal range of motion.  Cardiovascular: Normal rate, regular rhythm and normal heart sounds.   No murmur heard. Pulmonary/Chest: Effort normal and breath sounds normal.  Abdominal: There is no tenderness.  Musculoskeletal: He exhibits tenderness (this is located over the vertebral fracture region for percussion).  Neurological: He is alert and oriented to person, place, and time. He has normal reflexes.  Skin: Skin is warm and dry.  Psychiatric: His behavior is normal. Thought content normal.  Vitals reviewed.    Lab Results  Component Value Date   WBC 10.8 02/06/2013   HGB 15.4 02/06/2013   HCT 43.9 02/06/2013   PLT  202 02/06/2013   GLUCOSE 197 (H) 02/02/2016   CHOL 185 11/02/2015   TRIG 211 (H) 11/02/2015   HDL 44 11/02/2015   LDLCALC 99 11/02/2015   ALT 107 (H) 02/02/2016   AST 67 (H) 02/02/2016   NA 138 02/02/2016   K 4.1 02/02/2016   CL 100 02/02/2016   CREATININE 1.06 02/02/2016   BUN 17 02/02/2016   CO2 23 02/02/2016   TSH 5.680 (H) 02/02/2016   HGBA1C 7.4 07/14/2015    US Abdomen Complete  Result Date: 02/09/2016 CLINICAL  DATA:  Gallstones.  Aneurysm. EXAM: ABDOMEN ULTRASOUND COMPLETE COMPARISON:  CT 01/08/2016. FINDINGS: Gallbladder: Multiple gallstones. The largest measures 8 mm.Sludge also noted noted. Gallbladder wall thickness is 3 mm. Negative Murphy sign. Common bile duct: Diameter: 4 mm Liver: Liver is echogenic consistent with fatty infiltration and/or hepatocellular disease. No focal hepatic abnormality identified. IVC: No abnormality visualized. Pancreas: Visualized portion unremarkable. Spleen: Size and appearance within normal limits. Right Kidney: Length: 11.6 cm. Echogenicity within normal limits. No side mass or hydronephrosis visualized 2.2 cm simple cyst. Left Kidney: Length: 11.2 cm. Echogenicity within normal limits. No mass or hydronephrosis visualized. Abdominal aorta: 3.1 cm abdominal aortic aneurysm. Other findings: None. IMPRESSION: 1. Multiple gallstones and gallbladder sludge. Gallbladder wall is minimally prominent 3 mm. Negative Murphy sign. No biliary distention. 2. 3.1 cm abdominal aortic aneurysm. Recommend followup by ultrasound in 3 years. This recommendation follows ACR consensus guidelines: White Paper of the ACR Incidental Findings Committee II on Vascular Findings. J Am Coll Radiol 2013; 74:259-563 3. There is echogenic consistent with fatty infiltration and/or hepatocellular disease. Electronically Signed   By: Marcello Moores  Register   On: 02/09/2016 08:55    Assessment & Plan:   Geroge was seen today for cap.  Diagnoses and all orders for this visit:  CAP (community acquired pneumonia) -     DG Chest 2 View  Uncontrolled type 2 diabetes mellitus with complication, with long-term current use of insulin (HCC)  Bullous emphysema (HCC)  Lumbar compression fracture, with routine healing, subsequent encounter      I have discontinued Mr. Dobbins nystatin. I am also having him maintain his aspirin, freestyle, glucose monitoring kit, metFORMIN, SURE COMFORT PEN NEEDLES, ADVAIR DISKUS,  donepezil, LORazepam, SPIRIVA HANDIHALER, omeprazole, levalbuterol, clotrimazole, Insulin Glargine, HYDROcodone-acetaminophen, glucose blood, and levothyroxine.  No orders of the defined types were placed in this encounter.    Follow-up: Return in about 1 month (around 03/16/2016).  Claretta Fraise, M.D.

## 2016-02-14 NOTE — Progress Notes (Signed)
Please contact the patient regarding: Chest XRay shows pneumonia is improving

## 2016-02-15 DIAGNOSIS — E119 Type 2 diabetes mellitus without complications: Secondary | ICD-10-CM | POA: Diagnosis not present

## 2016-02-15 DIAGNOSIS — I10 Essential (primary) hypertension: Secondary | ICD-10-CM | POA: Diagnosis not present

## 2016-02-15 DIAGNOSIS — F0280 Dementia in other diseases classified elsewhere without behavioral disturbance: Secondary | ICD-10-CM | POA: Diagnosis not present

## 2016-02-15 DIAGNOSIS — M549 Dorsalgia, unspecified: Secondary | ICD-10-CM | POA: Diagnosis not present

## 2016-02-15 DIAGNOSIS — S32010D Wedge compression fracture of first lumbar vertebra, subsequent encounter for fracture with routine healing: Secondary | ICD-10-CM | POA: Diagnosis not present

## 2016-02-15 DIAGNOSIS — G300 Alzheimer's disease with early onset: Secondary | ICD-10-CM | POA: Diagnosis not present

## 2016-02-17 DIAGNOSIS — S32010D Wedge compression fracture of first lumbar vertebra, subsequent encounter for fracture with routine healing: Secondary | ICD-10-CM | POA: Diagnosis not present

## 2016-02-17 DIAGNOSIS — E119 Type 2 diabetes mellitus without complications: Secondary | ICD-10-CM | POA: Diagnosis not present

## 2016-02-17 DIAGNOSIS — F0280 Dementia in other diseases classified elsewhere without behavioral disturbance: Secondary | ICD-10-CM | POA: Diagnosis not present

## 2016-02-17 DIAGNOSIS — M549 Dorsalgia, unspecified: Secondary | ICD-10-CM | POA: Diagnosis not present

## 2016-02-17 DIAGNOSIS — G300 Alzheimer's disease with early onset: Secondary | ICD-10-CM | POA: Diagnosis not present

## 2016-02-17 DIAGNOSIS — I10 Essential (primary) hypertension: Secondary | ICD-10-CM | POA: Diagnosis not present

## 2016-02-17 NOTE — Telephone Encounter (Signed)
Faxed and received confirmation

## 2016-02-21 ENCOUNTER — Telehealth: Payer: Self-pay | Admitting: Family Medicine

## 2016-02-21 NOTE — Telephone Encounter (Signed)
As long as this is a patient request, go ahead. If it is from the supplier make sure that pt. Is on board with this first.

## 2016-02-21 NOTE — Telephone Encounter (Signed)
Spoke with pt's EC Steward DroneBrenda and she said we just need to get in touch with [facility] to discontinue the order for in home oxygen. D/C order written and Dr.Stacks signed. Faxed to [facility] [phone] and pt is aware.

## 2016-02-21 NOTE — Telephone Encounter (Signed)
Dr Darlyn ReadStacks please address and route to pool B if approved to D/C oxygen therapy

## 2016-02-22 ENCOUNTER — Institutional Professional Consult (permissible substitution): Admitting: Internal Medicine

## 2016-02-23 ENCOUNTER — Other Ambulatory Visit: Payer: Self-pay | Admitting: Family Medicine

## 2016-02-23 DIAGNOSIS — G300 Alzheimer's disease with early onset: Secondary | ICD-10-CM | POA: Diagnosis not present

## 2016-02-23 DIAGNOSIS — S32010D Wedge compression fracture of first lumbar vertebra, subsequent encounter for fracture with routine healing: Secondary | ICD-10-CM | POA: Diagnosis not present

## 2016-02-23 DIAGNOSIS — M549 Dorsalgia, unspecified: Secondary | ICD-10-CM | POA: Diagnosis not present

## 2016-02-23 DIAGNOSIS — F0280 Dementia in other diseases classified elsewhere without behavioral disturbance: Secondary | ICD-10-CM | POA: Diagnosis not present

## 2016-02-23 DIAGNOSIS — I10 Essential (primary) hypertension: Secondary | ICD-10-CM | POA: Diagnosis not present

## 2016-02-23 DIAGNOSIS — E119 Type 2 diabetes mellitus without complications: Secondary | ICD-10-CM | POA: Diagnosis not present

## 2016-02-25 DIAGNOSIS — M549 Dorsalgia, unspecified: Secondary | ICD-10-CM | POA: Diagnosis not present

## 2016-02-25 DIAGNOSIS — E119 Type 2 diabetes mellitus without complications: Secondary | ICD-10-CM | POA: Diagnosis not present

## 2016-02-25 DIAGNOSIS — I10 Essential (primary) hypertension: Secondary | ICD-10-CM | POA: Diagnosis not present

## 2016-02-25 DIAGNOSIS — S32010D Wedge compression fracture of first lumbar vertebra, subsequent encounter for fracture with routine healing: Secondary | ICD-10-CM | POA: Diagnosis not present

## 2016-02-25 DIAGNOSIS — G300 Alzheimer's disease with early onset: Secondary | ICD-10-CM | POA: Diagnosis not present

## 2016-02-25 DIAGNOSIS — F0280 Dementia in other diseases classified elsewhere without behavioral disturbance: Secondary | ICD-10-CM | POA: Diagnosis not present

## 2016-02-28 DIAGNOSIS — E119 Type 2 diabetes mellitus without complications: Secondary | ICD-10-CM | POA: Diagnosis not present

## 2016-02-28 DIAGNOSIS — I10 Essential (primary) hypertension: Secondary | ICD-10-CM | POA: Diagnosis not present

## 2016-02-28 DIAGNOSIS — F0280 Dementia in other diseases classified elsewhere without behavioral disturbance: Secondary | ICD-10-CM | POA: Diagnosis not present

## 2016-02-28 DIAGNOSIS — S32010D Wedge compression fracture of first lumbar vertebra, subsequent encounter for fracture with routine healing: Secondary | ICD-10-CM | POA: Diagnosis not present

## 2016-02-28 DIAGNOSIS — M549 Dorsalgia, unspecified: Secondary | ICD-10-CM | POA: Diagnosis not present

## 2016-02-28 DIAGNOSIS — G300 Alzheimer's disease with early onset: Secondary | ICD-10-CM | POA: Diagnosis not present

## 2016-03-01 ENCOUNTER — Telehealth: Payer: Self-pay | Admitting: Family Medicine

## 2016-03-01 DIAGNOSIS — I10 Essential (primary) hypertension: Secondary | ICD-10-CM | POA: Diagnosis not present

## 2016-03-01 DIAGNOSIS — G300 Alzheimer's disease with early onset: Secondary | ICD-10-CM | POA: Diagnosis not present

## 2016-03-01 DIAGNOSIS — M549 Dorsalgia, unspecified: Secondary | ICD-10-CM | POA: Diagnosis not present

## 2016-03-01 DIAGNOSIS — E119 Type 2 diabetes mellitus without complications: Secondary | ICD-10-CM | POA: Diagnosis not present

## 2016-03-01 DIAGNOSIS — S32010D Wedge compression fracture of first lumbar vertebra, subsequent encounter for fracture with routine healing: Secondary | ICD-10-CM | POA: Diagnosis not present

## 2016-03-01 DIAGNOSIS — F0280 Dementia in other diseases classified elsewhere without behavioral disturbance: Secondary | ICD-10-CM | POA: Diagnosis not present

## 2016-03-01 NOTE — Telephone Encounter (Signed)
TC back to pt, since this did come in from mail order pharmacy to make sure that their list of medications match what the patient is taking so that they do not request a refill on an incorrect medication in the future. Per Ardine EngJill Cayton, Nurse Mgr ask pt to bring in the receipt from the mail order pharmacy to see if we can get them reimbursed.

## 2016-03-14 ENCOUNTER — Ambulatory Visit (INDEPENDENT_AMBULATORY_CARE_PROVIDER_SITE_OTHER): Payer: Medicare Other | Admitting: Family Medicine

## 2016-03-14 ENCOUNTER — Encounter: Payer: Self-pay | Admitting: Family Medicine

## 2016-03-14 ENCOUNTER — Ambulatory Visit (INDEPENDENT_AMBULATORY_CARE_PROVIDER_SITE_OTHER): Payer: Medicare Other

## 2016-03-14 ENCOUNTER — Other Ambulatory Visit: Payer: Self-pay | Admitting: Family Medicine

## 2016-03-14 VITALS — BP 119/57 | HR 67 | Temp 97.3°F | Ht 73.0 in | Wt 179.5 lb

## 2016-03-14 DIAGNOSIS — IMO0002 Reserved for concepts with insufficient information to code with codable children: Secondary | ICD-10-CM

## 2016-03-14 DIAGNOSIS — E1165 Type 2 diabetes mellitus with hyperglycemia: Secondary | ICD-10-CM

## 2016-03-14 DIAGNOSIS — F0390 Unspecified dementia without behavioral disturbance: Secondary | ICD-10-CM

## 2016-03-14 DIAGNOSIS — E118 Type 2 diabetes mellitus with unspecified complications: Principal | ICD-10-CM

## 2016-03-14 DIAGNOSIS — S32000S Wedge compression fracture of unspecified lumbar vertebra, sequela: Secondary | ICD-10-CM

## 2016-03-14 DIAGNOSIS — Z794 Long term (current) use of insulin: Principal | ICD-10-CM

## 2016-03-14 DIAGNOSIS — R600 Localized edema: Secondary | ICD-10-CM | POA: Diagnosis not present

## 2016-03-14 DIAGNOSIS — J690 Pneumonitis due to inhalation of food and vomit: Secondary | ICD-10-CM

## 2016-03-14 DIAGNOSIS — R918 Other nonspecific abnormal finding of lung field: Secondary | ICD-10-CM

## 2016-03-14 DIAGNOSIS — R109 Unspecified abdominal pain: Secondary | ICD-10-CM | POA: Diagnosis not present

## 2016-03-14 LAB — URINALYSIS, COMPLETE
Bilirubin, UA: NEGATIVE
Ketones, UA: NEGATIVE
LEUKOCYTES UA: NEGATIVE
Nitrite, UA: NEGATIVE
PH UA: 7 (ref 5.0–7.5)
PROTEIN UA: NEGATIVE
RBC UA: NEGATIVE
Specific Gravity, UA: 1.02 (ref 1.005–1.030)
Urobilinogen, Ur: 0.2 mg/dL (ref 0.2–1.0)

## 2016-03-14 LAB — MICROSCOPIC EXAMINATION
BACTERIA UA: NONE SEEN
EPITHELIAL CELLS (NON RENAL): NONE SEEN /HPF (ref 0–10)
WBC, UA: NONE SEEN /hpf (ref 0–?)

## 2016-03-14 MED ORDER — ALBIGLUTIDE 30 MG ~~LOC~~ PEN: PEN_INJECTOR | SUBCUTANEOUS | 12 refills | Status: DC

## 2016-03-14 MED ORDER — HYDROCODONE-ACETAMINOPHEN 10-325 MG PO TABS: 1.0000 | ORAL_TABLET | Freq: Four times a day (QID) | ORAL | 0 refills | Status: DC | PRN

## 2016-03-14 MED ORDER — DONEPEZIL HCL 5 MG PO TABS: 5.0000 mg | ORAL_TABLET | Freq: Every day | ORAL | 1 refills | Status: DC

## 2016-03-14 NOTE — Progress Notes (Signed)
Subjective:  Patient ID: Jon [name], male    DOB: [birth year]  Age: 70 y.o. MRN: [id number]  CC: Medication Refill (pt here today for refill on norco and also brought in BS log)   HPI Jon [name] presents forFollow-up of diabetes. Patient checks blood sugar at home. Log sheet returned, reviewed with pt.  100-140 fasting and 160-220 postprandial Patient denies symptoms such as polyuria, polydipsia, excessive hunger, nausea No significant hypoglycemic spells noted. Medications as noted below. Taking them regularly without complication/adverse reaction being reported today. Increased Lantus to 60 units 2-3 weeks ago due to concern for climbing glucose readings. Discussion reveals that patient did not respond to the use of Onglyza in the past. There is a remaining supply sent to the patient as a result of the auto refill process. However, due to its ineffectiveness earlier this year patient does not want to resume the medication. However he is interested in using medication for the postprandial highs.  Patient states that he longer has a cough. He is not short of breath. He is not having any fever chills or sweats. His pneumonia symptoms have completely resolved. However, he has not been able to have surgery due to the questionable finding on his chest CT previously noted. He is in today for a repeat of his chest x-ray. He is still interested in having surgery for his low back pain if he can clear the lung finding. However he has still been told recently that until it is cleared surgery is not an option.  He reports that he is using approximately 2-1/2 Norco per day. Sometimes he has to use more. This is due to the severity of the pain in his lower back. He reports that he is now having some left flank pain. He has a history of renal stones found on recent evaluation. However they were in the kidney itself. At this time he has moderate flank pain on the left but he denies hematuria urgency and  dysuria. He is known to have a compression fracture. This is felt to be the cause for this pain. He is now concerned that some much time his past that he may not benefit from the surgery after all.  He has recently developed significant swelling at the right lower extremity. This is noted when he pulls his sock. It extends up the lower leg to the mid calf. There are no associated symptoms. It does not seem to go down overnight. He and his wife expresses significant concern that this could be related to his heart. This is of even more concerned due to the abnormality on the chest x-ray and relating that back to the swelling.  His wife is very concerned about his forgetfulness. He was offered a trial of Aricept earlier this year and has deferred that based on the [facility] of activity related to his spinal fracture and subsequent pneumonia. He also does not seem able to reason out things as well as he has in the past.  History Shavar has a past medical history of AAA (abdominal aortic aneurysm) ([place]); Anemia; Ataxia; Diabetes mellitus without complication ([place]); History of gallstones; Hyperlipidemia; Hypertension; and Vitamin D deficiency.   He has a past surgical history that includes ERCP; Inguinal hernia repair; and Tonsillectomy.   His family history includes Diabetes in his brother and mother; Hip fracture in his mother; Hyperlipidemia in his brother; Hypertension in his brother; Lupus in his sister; Pulmonary embolism in his mother; Scleroderma in his sister.He reports  that he quit smoking about 7 years ago. His smoking use included Cigarettes. He started smoking about 49 years ago. He has a 40.00 pack-year smoking history. He has never used smokeless tobacco. He reports that he does not drink alcohol or use drugs.  Current Outpatient Prescriptions on File Prior to Visit  Medication Sig Dispense Refill  . aspirin 81 MG tablet Take 81 mg by mouth daily.    . Insulin Glargine (LANTUS SOLOSTAR) 100  UNIT/ML Solostar Pen Inject 50 Units into the skin every morning. 50 mL 1  . omeprazole (PRILOSEC) 20 MG capsule Take 1 capsule (20 mg total) by mouth daily. 90 capsule 1   No current facility-administered medications on file prior to visit.     ROS Review of Systems  Constitutional: Negative for chills, diaphoresis, fever and unexpected weight change.  HENT: Negative for congestion, hearing loss, rhinorrhea and sore throat.   Eyes: Negative for visual disturbance.  Respiratory: Negative for cough and shortness of breath.   Cardiovascular: Negative for chest pain.  Gastrointestinal: Negative for abdominal pain, constipation and diarrhea.  Genitourinary: Negative for dysuria and flank pain.  Musculoskeletal: Positive for arthralgias and back pain. Negative for joint swelling.  Skin: Negative for rash.  Neurological: Negative for dizziness, syncope, weakness, numbness and headaches.  Psychiatric/Behavioral: Positive for confusion. Negative for dysphoric mood and sleep disturbance.    Objective:  BP (!) 119/57   Pulse 67   Temp 97.3 F (36.3 C) (Oral)   Ht '6\' 1"'  (1.854 m)   Wt 179 lb 8 oz (81.4 kg)   BMI 23.68 kg/m   BP Readings from Last 3 Encounters:  03/14/16 (!) 119/57  02/14/16 (!) 91/57  02/02/16 (!) 92/59    Wt Readings from Last 3 Encounters:  03/14/16 179 lb 8 oz (81.4 kg)  02/14/16 176 lb (79.8 kg)  02/02/16 189 lb (85.7 kg)     Physical Exam  Constitutional: He is oriented to person, place, and time. He appears well-developed and well-nourished. No distress.  HENT:  Head: Normocephalic and atraumatic.  Right Ear: External ear normal.  Left Ear: External ear normal.  Nose: Nose normal.  Mouth/Throat: Oropharynx is clear and moist.  Eyes: Conjunctivae and EOM are normal. Pupils are equal, round, and reactive to light.  Neck: Normal range of motion. Neck supple. No thyromegaly present.  Cardiovascular: Normal rate, regular rhythm and normal heart sounds.     No murmur heard. Pulmonary/Chest: Effort normal and breath sounds normal. No respiratory distress. He has no wheezes. He has no rales.  Abdominal: Soft. Bowel sounds are normal. He exhibits no distension. There is no tenderness.  Musculoskeletal: He exhibits tenderness (at left flank, mild for percussion).  Lymphadenopathy:    He has no cervical adenopathy.  Neurological: He is alert and oriented to person, place, and time. He has normal reflexes.  Skin: Skin is warm and dry.  Psychiatric: He has a normal mood and affect. His behavior is normal. Judgment and thought content normal.    Lab Results  Component Value Date   HGBA1C 7.4 07/14/2015   HGBA1C 8.4 03/12/2015   HGBA1C 13.0 03/09/2015    Lab Results  Component Value Date   WBC 10.8 02/06/2013   HGB 15.4 02/06/2013   HCT 43.9 02/06/2013   PLT 202 02/06/2013   GLUCOSE 197 (H) 02/02/2016   CHOL 185 11/02/2015   TRIG 211 (H) 11/02/2015   HDL 44 11/02/2015   LDLCALC 99 11/02/2015   ALT 107 (H) 02/02/2016  AST 67 (H) 02/02/2016   NA 138 02/02/2016   K 4.1 02/02/2016   CL 100 02/02/2016   CREATININE 1.06 02/02/2016   BUN 17 02/02/2016   CO2 23 02/02/2016   TSH 5.680 (H) 02/02/2016   HGBA1C 7.4 07/14/2015   MICROALBUR 20 09/24/2014     Assessment & Plan:   Jon [name] was seen today for medication refill.  Diagnoses and all orders for this visit:  Uncontrolled type 2 diabetes mellitus with complication, with long-term current use of insulin (HCC) -     Urinalysis, Complete -     DG Abd 2 Views; Future -     DG Chest 2 View; Future  Lumbar compression fracture, sequela -     HYDROcodone-acetaminophen (NORCO) 10-325 MG tablet; Take 1 tablet by mouth 4 (four) times daily as needed for moderate pain or severe pain. -     Urinalysis, Complete -     DG Abd 2 Views; Future -     DG Chest 2 View; Future  Aspiration pneumonia, unspecified aspiration pneumonia type, unspecified laterality, unspecified part of lung ([place]) -      DG Chest 2 View; Future  Flank pain -     DG Abd 2 Views; Future  Edema of right lower extremity -     Ambulatory referral to Cardiology  Other orders -     donepezil (ARICEPT) 5 MG tablet; Take 1 tablet (5 mg total) by mouth at bedtime. -     Albiglutide (TANZEUM) 30 MG PEN; Inject 30 mg subcutaneously once a a week. -     Microscopic Examination  Chest x-ray shows no improvement in the previously noted nonspecific mass. Therefore CT of the chest has been ordered.  I have discontinued Mr. Haynesworth freestyle, glucose monitoring kit, metFORMIN, SURE COMFORT PEN NEEDLES, ADVAIR DISKUS, LORazepam, SPIRIVA HANDIHALER, levalbuterol, clotrimazole, glucose blood, levothyroxine, and ONGLYZA. I have also changed his donepezil. Additionally, I am having him start on Albiglutide. Lastly, I am having him maintain his aspirin, omeprazole, Insulin Glargine, and HYDROcodone-acetaminophen.  Meds ordered this encounter  Medications  . HYDROcodone-acetaminophen (NORCO) 10-325 MG tablet    Sig: Take 1 tablet by mouth 4 (four) times daily as needed for moderate pain or severe pain.    Dispense:  120 tablet    Refill:  0  . donepezil (ARICEPT) 5 MG tablet    Sig: Take 1 tablet (5 mg total) by mouth at bedtime.    Dispense:  30 tablet    Refill:  1  . Albiglutide (TANZEUM) 30 MG PEN    Sig: Inject 30 mg subcutaneously once a a week.    Dispense:  4 each    Refill:  12     Follow-up: Return in about 1 month (around 04/13/2016).  Claretta Fraise, M.D.

## 2016-03-20 ENCOUNTER — Telehealth: Payer: Self-pay | Admitting: Family Medicine

## 2016-03-21 ENCOUNTER — Other Ambulatory Visit: Payer: Self-pay | Admitting: *Deleted

## 2016-03-21 ENCOUNTER — Other Ambulatory Visit: Payer: Self-pay | Admitting: Family Medicine

## 2016-03-21 DIAGNOSIS — R9389 Abnormal findings on diagnostic imaging of other specified body structures: Secondary | ICD-10-CM

## 2016-03-21 DIAGNOSIS — S32010S Wedge compression fracture of first lumbar vertebra, sequela: Secondary | ICD-10-CM

## 2016-03-21 DIAGNOSIS — E1165 Type 2 diabetes mellitus with hyperglycemia: Secondary | ICD-10-CM

## 2016-03-21 DIAGNOSIS — IMO0002 Reserved for concepts with insufficient information to code with codable children: Secondary | ICD-10-CM

## 2016-03-21 DIAGNOSIS — E118 Type 2 diabetes mellitus with unspecified complications: Principal | ICD-10-CM

## 2016-03-21 DIAGNOSIS — Z794 Long term (current) use of insulin: Principal | ICD-10-CM

## 2016-03-21 MED ORDER — ALBIGLUTIDE 30 MG ~~LOC~~ PEN: PEN_INJECTOR | SUBCUTANEOUS | 3 refills | Status: DC

## 2016-03-21 NOTE — Telephone Encounter (Signed)
Patients wife aware

## 2016-03-21 NOTE — Telephone Encounter (Signed)
Please contact the patient M know the CT scan has been ordered per their request. Also I'm not sure why the 10 cm was not picked [facility] although was ordered. However we did send a new ordered this morning.

## 2016-03-21 NOTE — Telephone Encounter (Signed)
Please refer as requested 

## 2016-03-21 NOTE — Telephone Encounter (Signed)
Patient's wife called stating that patient did not like the pulmonologist they seen previously and would like to have a new referral to pulmonology.  Referral placed.  Patient's wife also states that Rx for tanzeum was never sent [facility].  New rx printed and faxed.  Patient would also like to have xray or CT scan to check on lumbar fracture.

## 2016-03-22 ENCOUNTER — Other Ambulatory Visit: Payer: Self-pay | Admitting: Family Medicine

## 2016-03-23 NOTE — Telephone Encounter (Signed)
Wife aware of appointment date/time Letter sent with appointment date/time

## 2016-03-28 ENCOUNTER — Ambulatory Visit (HOSPITAL_COMMUNITY)
Admission: RE | Admit: 2016-03-28 | Discharge: 2016-03-28 | Disposition: A | Discharge status: Home / Self Care | Payer: Medicare Other | Source: Ambulatory Visit | Attending: Family Medicine | Admitting: Family Medicine

## 2016-03-28 DIAGNOSIS — R918 Other nonspecific abnormal finding of lung field: Secondary | ICD-10-CM | POA: Diagnosis not present

## 2016-03-28 DIAGNOSIS — I2510 Atherosclerotic heart disease of native coronary artery without angina pectoris: Secondary | ICD-10-CM | POA: Insufficient documentation

## 2016-03-28 DIAGNOSIS — S32010S Wedge compression fracture of first lumbar vertebra, sequela: Secondary | ICD-10-CM | POA: Insufficient documentation

## 2016-03-28 DIAGNOSIS — J439 Emphysema, unspecified: Secondary | ICD-10-CM | POA: Diagnosis not present

## 2016-03-28 DIAGNOSIS — R29890 Loss of height: Secondary | ICD-10-CM | POA: Insufficient documentation

## 2016-03-28 DIAGNOSIS — M545 Low back pain: Secondary | ICD-10-CM | POA: Diagnosis not present

## 2016-03-28 DIAGNOSIS — X58XXXS Exposure to other specified factors, sequela: Secondary | ICD-10-CM | POA: Insufficient documentation

## 2016-03-28 DIAGNOSIS — M5136 Other intervertebral disc degeneration, lumbar region: Secondary | ICD-10-CM | POA: Diagnosis not present

## 2016-03-28 DIAGNOSIS — I700 Atherosclerosis of aorta: Secondary | ICD-10-CM | POA: Insufficient documentation

## 2016-03-28 DIAGNOSIS — J8410 Pulmonary fibrosis, unspecified: Secondary | ICD-10-CM | POA: Diagnosis not present

## 2016-03-29 ENCOUNTER — Other Ambulatory Visit: Payer: Self-pay | Admitting: Family Medicine

## 2016-04-04 ENCOUNTER — Encounter: Payer: Self-pay | Admitting: *Deleted

## 2016-04-05 ENCOUNTER — Encounter: Payer: Self-pay | Admitting: Cardiovascular Disease

## 2016-04-05 ENCOUNTER — Ambulatory Visit (INDEPENDENT_AMBULATORY_CARE_PROVIDER_SITE_OTHER): Payer: Medicare Other | Admitting: Cardiovascular Disease

## 2016-04-05 VITALS — BP 100/62 | HR 65 | Ht 72.0 in | Wt 177.0 lb

## 2016-04-05 DIAGNOSIS — M7989 Other specified soft tissue disorders: Secondary | ICD-10-CM | POA: Diagnosis not present

## 2016-04-05 NOTE — Progress Notes (Signed)
CARDIOLOGY CONSULT NOTE  Patient ID: Jon [name] MRN: [id number] DOB/AGE: 1946/02/08 70 y.o.  Admit date: (Not on file) Primary Physician: Mechele Claude, MD Referring Physician:   Reason for Consultation: edema  HPI: 70 yr old male with diabetes presents for evaluation of right leg swelling. Extends as high as mid calf region. No associated symptoms.  02/02/16: BUN 17, creatinine 1.06, TSH 5.68. CXR 03/14/16: RLL atelectasis vs pneumonia. Chest CT 03/28/16: Scattered foci of atherosclerotic calcification as well as foci of coronary artery calcification noted.  Reportedly underwent right lower extremity ultrasonography at [facility] in [facility] and was not found to have DVT.   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 He recently sustained a fall and a lumbar fracture and was hospitalized at [facility] in early July. He had noticed bilateral feet swelling right moreso than left since then. He quit smoking 7 or 8 years ago.   He denies chest pain, palpitations, exertional dyspnea, orthopnea, and paroxysmal nocturnal dyspnea.  ECG performed in the office today which I personally reviewed demonstrates normal sinus rhythm with no ischemic ST segment or T-wave abnormalities, nor any arrhythmias.     Allergies  Allergen Reactions  . Donepezil Nausea And Vomiting    Current Outpatient Prescriptions  Medication Sig Dispense Refill  . Albiglutide (TANZEUM) 30 MG PEN Inject 30 mg subcutaneously once a a week. 12 each 3  . aspirin 81 MG tablet Take 81 mg by mouth daily.    [name] HYDROcodone-acetaminophen (NORCO) 10-325 MG tablet Take 1 tablet by mouth 4 (four) times daily as needed for moderate pain or severe pain. 120 tablet 0  . Insulin Glargine (LANTUS SOLOSTAR) 100 UNIT/ML Solostar Pen Inject 50 Units into the skin every morning. (Patient taking differently: Inject 65 Units into the skin every morning. ) 50 mL 1  . omeprazole (PRILOSEC) 20 MG  capsule Take 1 capsule (20 mg total) by mouth daily. 90 capsule 1  . rivastigmine (EXELON) 1.5 MG capsule Take 1-2 capsules by mouth as directed.     No current facility-administered medications for this visit.     Past Medical History:  Diagnosis Date  . AAA (abdominal aortic aneurysm) (HCC)   . Anemia   . Ataxia   . Diabetes mellitus without complication (HCC)   . History of gallstones   . Hyperlipidemia   . Hypertension   . Vitamin D deficiency     Past Surgical History:  Procedure Laterality Date  . ERCP    . INGUINAL HERNIA REPAIR    . TONSILLECTOMY      Social History   Social History  . Marital status: Married    Spouse name: N/A  . Number of children: N/A  . Years of education: N/A   Occupational History  . Not on file.   Social History Main Topics  . Smoking status: Former Smoker    Packs/day: 1.00    Years: 40.00    Types: Cigarettes    Start date: 07/03/1966    Quit date: 02/06/2009  . Smokeless tobacco: Never Used  . Alcohol use No  . Drug use: No  . Sexual activity: Yes   Other Topics Concern  . Not on file   Social History Narrative  . No narrative on file     No family history of premature CAD in 1st degree relatives.  Prior to Admission medications   Medication Sig Start Date End Date  Taking? Authorizing Provider  Albiglutide (TANZEUM) 30 MG PEN Inject 30 mg subcutaneously once a a week. 03/21/16   Mechele ClaudeWarren Stacks, MD  aspirin 81 MG tablet Take 81 mg by mouth daily.    Historical Provider, MD  donepezil (ARICEPT) 5 MG tablet Take 1 tablet (5 mg total) by mouth at bedtime. 03/14/16   Mechele ClaudeWarren Stacks, MD  HYDROcodone-acetaminophen (NORCO) 10-325 MG tablet Take 1 tablet by mouth 4 (four) times daily as needed for moderate pain or severe pain. 03/14/16   Mechele ClaudeWarren Stacks, MD  Insulin Glargine (LANTUS SOLOSTAR) 100 UNIT/ML Solostar Pen Inject 50 Units into the skin every morning. 01/25/16   Mechele ClaudeWarren Stacks, MD  omeprazole (PRILOSEC) 20 MG capsule Take 1  capsule (20 mg total) by mouth daily. 01/17/16   Elige RadonJoshua A Dettinger, MD     Review of systems complete and found to be negative unless listed above in HPI     Physical exam Blood pressure 100/62, pulse 65, height 6' (1.829 m), weight 177 lb (80.3 kg). General: NAD Neck: No JVD, no thyromegaly or thyroid nodule.  Lungs: Clear to auscultation bilaterally with normal respiratory effort. CV: Nondisplaced PMI. Regular rate and rhythm, normal S1/S2, no S3/S4, no murmur.  No peripheral edema. +venous varicosities b/l.  Normal pedal pulses.  Abdomen: Soft, nontender, no distention.  Skin: Intact without lesions or rashes.  Neurologic: Alert and oriented x 3.  Psych: Normal affect. Extremities: No clubbing or cyanosis.  HEENT: Normal.   ECG: Most recent ECG reviewed.  Labs:   Lab Results  Component Value Date   WBC 10.8 02/06/2013   HGB 15.4 02/06/2013   HCT 43.9 02/06/2013   MCV 82 02/06/2013   PLT 202 02/06/2013   No results for input(s): NA, K, CL, CO2, BUN, CREATININE, CALCIUM, PROT, BILITOT, ALKPHOS, ALT, AST, GLUCOSE in the last 168 hours.  Invalid input(s): LABALBU No results found for: CKTOTAL, CKMB, CKMBINDEX, TROPONINI  Lab Results  Component Value Date   CHOL 185 11/02/2015   CHOL 173 07/14/2015   CHOL 179 02/06/2014   Lab Results  Component Value Date   HDL 44 11/02/2015   HDL 50 07/14/2015   HDL 63 02/06/2014   Lab Results  Component Value Date   LDLCALC 99 11/02/2015   LDLCALC 86 07/14/2015   LDLCALC 93 02/06/2014   Lab Results  Component Value Date   TRIG 211 (H) 11/02/2015   TRIG 185 (H) 07/14/2015   TRIG 114 02/06/2014   Lab Results  Component Value Date   CHOLHDL 4.2 11/02/2015   CHOLHDL 3.5 07/14/2015   CHOLHDL 2.8 02/06/2014   No results found for: LDLDIRECT       Studies: No results found.  ASSESSMENT AND PLAN:  1. Bilateral feet swelling: Likely due to venous varicosities. No symptoms or physical exam findings to suggest cardiac  disease. ECG is normal. Reportedly no evidence of DVT. Recommend compression stockings.   Dispo: fu prn.   Signed: Prentice DockerSuresh Dailah Opperman, M.D., F.A.C.C.  04/05/2016, 10:41 AM

## 2016-04-05 NOTE — Patient Instructions (Signed)
Medication Instructions:  Continue all current medications.  Labwork: none  Testing/Procedures: none  Follow-Up: As needed.    Any Other Special Instructions Will Be Listed Below (If Applicable).  If you need a refill on your cardiac medications before your next appointment, please call your pharmacy.  

## 2016-04-06 DIAGNOSIS — G301 Alzheimer's disease with late onset: Secondary | ICD-10-CM | POA: Diagnosis not present

## 2016-04-06 DIAGNOSIS — M5416 Radiculopathy, lumbar region: Secondary | ICD-10-CM | POA: Diagnosis not present

## 2016-04-06 DIAGNOSIS — F0280 Dementia in other diseases classified elsewhere without behavioral disturbance: Secondary | ICD-10-CM | POA: Diagnosis not present

## 2016-04-10 ENCOUNTER — Telehealth: Payer: Self-pay | Admitting: Family Medicine

## 2016-04-10 NOTE — Telephone Encounter (Signed)
Patient was scheduled for a one month recheck per Dr. Sharen HonesStack's notes from last visit.   His wife can discuss whether she should return to work or continue with FMLA at that time.

## 2016-04-11 DIAGNOSIS — J849 Interstitial pulmonary disease, unspecified: Secondary | ICD-10-CM | POA: Diagnosis not present

## 2016-04-11 DIAGNOSIS — J449 Chronic obstructive pulmonary disease, unspecified: Secondary | ICD-10-CM | POA: Diagnosis not present

## 2016-04-11 DIAGNOSIS — Z77090 Contact with and (suspected) exposure to asbestos: Secondary | ICD-10-CM | POA: Diagnosis not present

## 2016-04-11 DIAGNOSIS — R0683 Snoring: Secondary | ICD-10-CM | POA: Diagnosis not present

## 2016-04-11 DIAGNOSIS — Z87891 Personal history of nicotine dependence: Secondary | ICD-10-CM | POA: Diagnosis not present

## 2016-04-11 DIAGNOSIS — G4733 Obstructive sleep apnea (adult) (pediatric): Secondary | ICD-10-CM | POA: Insufficient documentation

## 2016-04-11 DIAGNOSIS — J984 Other disorders of lung: Secondary | ICD-10-CM | POA: Diagnosis not present

## 2016-04-12 ENCOUNTER — Encounter: Payer: Self-pay | Admitting: Family Medicine

## 2016-04-12 ENCOUNTER — Ambulatory Visit (INDEPENDENT_AMBULATORY_CARE_PROVIDER_SITE_OTHER): Payer: Medicare Other | Admitting: Family Medicine

## 2016-04-12 VITALS — BP 104/67 | HR 71 | Ht 72.0 in | Wt 178.0 lb

## 2016-04-12 DIAGNOSIS — Z794 Long term (current) use of insulin: Secondary | ICD-10-CM

## 2016-04-12 DIAGNOSIS — E1165 Type 2 diabetes mellitus with hyperglycemia: Secondary | ICD-10-CM | POA: Diagnosis not present

## 2016-04-12 DIAGNOSIS — IMO0002 Reserved for concepts with insufficient information to code with codable children: Secondary | ICD-10-CM

## 2016-04-12 DIAGNOSIS — E118 Type 2 diabetes mellitus with unspecified complications: Secondary | ICD-10-CM

## 2016-04-12 DIAGNOSIS — I10 Essential (primary) hypertension: Secondary | ICD-10-CM | POA: Diagnosis not present

## 2016-04-12 DIAGNOSIS — S32000S Wedge compression fracture of unspecified lumbar vertebra, sequela: Secondary | ICD-10-CM

## 2016-04-12 MED ORDER — RIVASTIGMINE TARTRATE 1.5 MG PO CAPS: 1.5000 mg | ORAL_CAPSULE | ORAL | 2 refills | Status: DC

## 2016-04-12 MED ORDER — ONDANSETRON 8 MG PO TBDP: 8.0000 mg | ORAL_TABLET | Freq: Four times a day (QID) | ORAL | 2 refills | Status: DC | PRN

## 2016-04-12 MED ORDER — HYDROCODONE-ACETAMINOPHEN 10-325 MG PO TABS: 1.0000 | ORAL_TABLET | Freq: Four times a day (QID) | ORAL | 0 refills | Status: DC | PRN

## 2016-04-12 NOTE — Progress Notes (Signed)
Subjective:  Patient ID: Jon [name], male    DOB: August 23, 1945  Age: 70 y.o. MRN: [id number]  CC: Back Pain (pt here today wanting a refill on pain medication for his back, he just saw the pulmonologist yesterday and was cleared for surgery and also he will start his Exelon )   HPI Jon [name] presents for Recheck of his use of pain seem injection for his diabetes. He continues to use the Lantus as well. His wife says that his blood sugars are running in the 90-110 range. None have been high or low. He does have some vague nausea. Of note is that his insurance would not cover Exelon patch. He had a lot of nausea when taking Aricept so that was discontinued. Neurology has given him a prescription for Exelon pills. He was cleared by pulmonology yesterday to have surgery for his spinal compression fracture. They await a call from Dr. Channing Muttersoy to schedule the vertebral plasty. Until then he continues to use one half of a hydrocodone tablet 4 times daily. His wife questions if she can go back to work right away. After discussion it seems it would be more appropriate for her to wait until after he has recuperated from surgery and become stable on his full dose of Exelon. Patient is silent for this discussion. History primarily is coming from his wife today.   History Jon [name] has a past medical history of AAA (abdominal aortic aneurysm) (HCC); Anemia; Ataxia; Diabetes mellitus without complication (HCC); History of gallstones; Hyperlipidemia; Hypertension; and Vitamin D deficiency.   He has a past surgical history that includes ERCP; Inguinal hernia repair; and Tonsillectomy.   His family history includes Diabetes in his brother and mother; Hip fracture in his mother; Hyperlipidemia in his brother; Hypertension in his brother; Lupus in his sister; Pulmonary embolism in his mother; Scleroderma in his sister.He reports that he quit smoking about 7 years ago. His smoking use included Cigarettes. He started smoking  about 49 years ago. He has a 40.00 pack-year smoking history. He has never used smokeless tobacco. He reports that he does not drink alcohol or use drugs.     Medication List       Accurate as of 04/12/16 10:22 PM. Always use your most recent med list.          Albiglutide 30 MG Pen Commonly known as:  TANZEUM Inject 30 mg subcutaneously once a a week.   aspirin 81 MG tablet Take 81 mg by mouth daily.   HYDROcodone-acetaminophen 10-325 MG tablet Commonly known as:  NORCO Take 1 tablet by mouth 4 (four) times daily as needed for moderate pain or severe pain.   Insulin Glargine 100 UNIT/ML Solostar Pen Commonly known as:  LANTUS SOLOSTAR Inject 50 Units into the skin every morning.   omeprazole 20 MG capsule Commonly known as:  PRILOSEC Take 1 capsule (20 mg total) by mouth daily.   ondansetron 8 MG disintegrating tablet Commonly known as:  ZOFRAN-ODT Take 1 tablet (8 mg total) by mouth every 6 (six) hours as needed for nausea or vomiting.   rivastigmine 1.5 MG capsule Commonly known as:  EXELON Take 1-2 capsules (1.5-3 mg total) by mouth as directed.       ROS Review of Systems  Constitutional: Negative for chills, diaphoresis and fever.  HENT: Negative for rhinorrhea and sore throat.   Respiratory: Negative for cough and shortness of breath.   Cardiovascular: Negative for chest pain.  Gastrointestinal: Positive for nausea. Negative  for abdominal pain, constipation, diarrhea and vomiting.  Musculoskeletal: Positive for arthralgias, back pain and myalgias.  Skin: Negative for rash.  Neurological: Negative for weakness and headaches.    Objective:  BP 104/67   Pulse 71   Ht 6' (1.829 m)   Wt 178 lb (80.7 kg)   BMI 24.14 kg/m   BP Readings from Last 3 Encounters:  04/12/16 104/67  04/05/16 100/62  03/14/16 (!) 119/57    Wt Readings from Last 3 Encounters:  04/12/16 178 lb (80.7 kg)  04/05/16 177 lb (80.3 kg)  03/14/16 179 lb 8 oz (81.4 kg)      Physical Exam  Constitutional: He appears well-developed and well-nourished.  HENT:  Head: Normocephalic and atraumatic.  Right Ear: Tympanic membrane and external ear normal. No decreased hearing is noted.  Left Ear: Tympanic membrane and external ear normal. No decreased hearing is noted.  Mouth/Throat: No oropharyngeal exudate or posterior oropharyngeal erythema.  Eyes: Pupils are equal, round, and reactive to light.  Neck: Normal range of motion. Neck supple.  Cardiovascular: Normal rate and regular rhythm.   No murmur heard. Pulmonary/Chest: Breath sounds normal. No respiratory distress.  Abdominal: Soft. Bowel sounds are normal. He exhibits no mass. There is no tenderness.  Vitals reviewed.    Lab Results  Component Value Date   WBC 10.8 02/06/2013   HGB 15.4 02/06/2013   HCT 43.9 02/06/2013   PLT 202 02/06/2013   GLUCOSE 197 (H) 02/02/2016   CHOL 185 11/02/2015   TRIG 211 (H) 11/02/2015   HDL 44 11/02/2015   LDLCALC 99 11/02/2015   ALT 107 (H) 02/02/2016   AST 67 (H) 02/02/2016   NA 138 02/02/2016   K 4.1 02/02/2016   CL 100 02/02/2016   CREATININE 1.06 02/02/2016   BUN 17 02/02/2016   CO2 23 02/02/2016   TSH 5.680 (H) 02/02/2016   HGBA1C 7.4 07/14/2015   MICROALBUR 20 09/24/2014    Ct Chest Wo Contrast  Result Date: 03/28/2016 CLINICAL DATA:  Abnormal chest radiograph. EXAM: CT CHEST WITHOUT CONTRAST TECHNIQUE: Multidetector CT imaging of the chest was performed following the standard protocol without IV contrast. COMPARISON:  Chest radiograph March 14, 2016; chest CT January 21, 2016 FINDINGS: Cardiovascular: There is no apparent thoracic aortic aneurysm. Visualized great vessels show mild atherosclerotic calcification but otherwise no abnormality on this noncontrast enhanced study. There are scattered foci of atherosclerotic calcification in the aorta. There are multiple foci of coronary artery calcification. The pericardium is not appreciably thickened.  Mediastinum/Nodes: Thyroid appears unremarkable. There are scattered subcentimeter mediastinal lymph nodes which are stable. No adenopathy by size criteria is apparent. There is a small hiatal hernia. Lungs/Pleura: There is extensive underlying emphysematous change with multiple bullae. There is bibasilar fibrotic type change as well as areas of primarily lower lobe bronchiectatic change bilaterally. The previously noted airspace consolidation in the right lower lobe has resolved. There is mild scarring in this area. There is no evident parenchymal lung mass. There are areas of mild pleural thickening in the lung base regions, stable. Upper Abdomen: Visualized upper abdomen appears unremarkable. Musculoskeletal: Collapse of the L1 vertebral body is partially visualized on this study, better seen on prior CT. Prior which fracture at T6 is stable. There are multiple focal areas of degenerative type change. There are no blastic or lytic bone lesions. IMPRESSION: Underlying bullous emphysematous change with parenchymal lung changes most consistent with usual interstitial pneumonitis. Fibrotic change is predominant in the bases, typical for usual interstitial pneumonitis.  There is no frank edema or consolidation. No mass or adenopathy is evident currently. There are scattered foci of atherosclerotic calcification as well as foci of coronary artery calcification noted. Electronically Signed   By: Bretta Bang III M.D.   On: 03/28/2016 08:50   Mr Lumbar Spine Wo Contrast  Result Date: 03/28/2016 CLINICAL DATA:  L1 compression fracture. Low back pain after fall. Subsequent encounter EXAM: MRI LUMBAR SPINE WITHOUT CONTRAST TECHNIQUE: Multiplanar, multisequence MR imaging of the lumbar spine was performed. No intravenous contrast was administered. COMPARISON:  Lumbar spine CT 01/08/2016 FINDINGS: Segmentation:  Standard. Alignment:  Mild kyphotic deformity due to L1 fracture. Vertebrae: L1 vertebral body fracture  with advanced central height loss that is progressed from prior. No height loss along anterior or posterior cortex. There is mild residual marrow edema along the fracture. No perispinal edema. No significant retropulsion or canal stenosis. No new fracture. No evidence of underlying bone lesion or discitis. Conus medullaris: Extends to the L1 level and appears normal. Paraspinal and other soft tissues: Abdominal aortic aneurysm also reported on lumbar CT, measuring up to 35 mm on sagittal acquisition. 2 cm left renal cyst. Disc levels: T12- L1: No impingement L1-L2: Mild annulus bulging.  Negative facets.  No impingement L2-L3: Mild annulus bulging.  Negative facets.  No impingement L3-L4: Right eccentric disc bulging. Negative facets. No impingement L4-L5: Right eccentric disc bulging. Facet arthropathy with joint effusion on the left. No impingement L5-S1:Disc narrowing and bulging with small noncompressive left paracentral disc protrusion. Minor facet spurring. Negative for impingement. IMPRESSION: 1. Nearly healed L1 body fracture. Central height loss progressed since 01/08/2016; no retropulsion or canal stenosis. 2. Noncompressive disc and facet degeneration as described above. 3. **An incidental finding of potential clinical significance has been found. 35 mm abdominal aortic aneurysm. Recommend followup by ultrasound in 2 years. This recommendation follows ACR consensus guidelines: White Paper of the ACR Incidental Findings Committee II on Vascular Findings. J Am Coll Radiol 2013; 10:789-794.** Electronically Signed   By: Marnee Spring M.D.   On: 03/28/2016 09:26    Assessment & Plan:   Keisean was seen today for back pain.  Diagnoses and all orders for this visit:  Uncontrolled type 2 diabetes mellitus with complication, with long-term current use of insulin (HCC)  Lumbar compression fracture, sequela -     HYDROcodone-acetaminophen (NORCO) 10-325 MG tablet; Take 1 tablet by mouth 4 (four) times  daily as needed for moderate pain or severe pain.  Essential hypertension  Other orders -     Discontinue: ondansetron (ZOFRAN-ODT) 8 MG disintegrating tablet; Take 1 tablet (8 mg total) by mouth every 6 (six) hours as needed for nausea or vomiting. -     rivastigmine (EXELON) 1.5 MG capsule; Take 1-2 capsules (1.5-3 mg total) by mouth as directed. -     ondansetron (ZOFRAN-ODT) 8 MG disintegrating tablet; Take 1 tablet (8 mg total) by mouth every 6 (six) hours as needed for nausea or vomiting.      I have changed Jon [name] rivastigmine. I am also having him maintain his aspirin, omeprazole, Insulin Glargine, Albiglutide, HYDROcodone-acetaminophen, and ondansetron.  Meds ordered this encounter  Medications  . DISCONTD: ondansetron (ZOFRAN-ODT) 8 MG disintegrating tablet    Sig: Take 1 tablet (8 mg total) by mouth every 6 (six) hours as needed for nausea or vomiting.    Dispense:  50 tablet    Refill:  2  . rivastigmine (EXELON) 1.5 MG capsule    Sig: Take 1-2  capsules (1.5-3 mg total) by mouth as directed.    Dispense:  90 capsule    Refill:  2  . HYDROcodone-acetaminophen (NORCO) 10-325 MG tablet    Sig: Take 1 tablet by mouth 4 (four) times daily as needed for moderate pain or severe pain.    Dispense:  120 tablet    Refill:  0  . ondansetron (ZOFRAN-ODT) 8 MG disintegrating tablet    Sig: Take 1 tablet (8 mg total) by mouth every 6 (six) hours as needed for nausea or vomiting.    Dispense:  50 tablet    Refill:  2     Follow-up: Return in about 30 days (around 05/12/2016) for diabetes, Pain.  Mechele Claude, M.D.

## 2016-04-24 DIAGNOSIS — M4856XA Collapsed vertebra, not elsewhere classified, lumbar region, initial encounter for fracture: Secondary | ICD-10-CM | POA: Diagnosis not present

## 2016-04-24 DIAGNOSIS — G912 (Idiopathic) normal pressure hydrocephalus: Secondary | ICD-10-CM | POA: Diagnosis not present

## 2016-04-24 DIAGNOSIS — S32010A Wedge compression fracture of first lumbar vertebra, initial encounter for closed fracture: Secondary | ICD-10-CM | POA: Diagnosis not present

## 2016-04-26 DIAGNOSIS — M5416 Radiculopathy, lumbar region: Secondary | ICD-10-CM | POA: Diagnosis not present

## 2016-05-01 ENCOUNTER — Telehealth: Payer: Self-pay | Admitting: *Deleted

## 2016-05-01 NOTE — Telephone Encounter (Signed)
call from pt's wife regarding CT chest w/ contrast scheduled for 05/08/2016 Pt had recent CT so does he need another Please review and advise

## 2016-05-02 NOTE — Telephone Encounter (Signed)
I saw that you had ordered a low dose CT for this pt. Do you still want it? He had a CT chest on Sept 26. Thanks, WS

## 2016-05-02 NOTE — Telephone Encounter (Signed)
Patient has lung CT 05/2015 for lung cancer screening - order for 05/2016 was for yearly screening.  Since CT of lung was done 03/2016 I don't think repeat 05/2016 is needed but will defer to PCP.

## 2016-05-02 NOTE — Telephone Encounter (Signed)
Please contact the patient and cancel the upcoming CT

## 2016-05-02 NOTE — Telephone Encounter (Signed)
Patients wife aware

## 2016-05-03 ENCOUNTER — Telehealth: Payer: Self-pay | Admitting: Family Medicine

## 2016-05-03 NOTE — Telephone Encounter (Signed)
Patient states he would like a rx for a walker with a seat in breaks due to his trouble walking because of back injury. Please advise and send back to pools.

## 2016-05-04 ENCOUNTER — Other Ambulatory Visit: Payer: Self-pay

## 2016-05-04 DIAGNOSIS — S32030D Wedge compression fracture of third lumbar vertebra, subsequent encounter for fracture with routine healing: Secondary | ICD-10-CM

## 2016-05-04 DIAGNOSIS — Z87898 Personal history of other specified conditions: Secondary | ICD-10-CM

## 2016-05-04 NOTE — Telephone Encounter (Signed)
Prescription printed, Paula ComptonKarla placed on Dr Darlyn ReadStacks desk to sign and she will contact patient for pick up

## 2016-05-04 NOTE — Telephone Encounter (Signed)
Please  write and I will sign. Thanks, WS 

## 2016-05-04 NOTE — Telephone Encounter (Signed)
Waiting for Dr. Darlyn ReadStacks to sign Rx

## 2016-05-08 ENCOUNTER — Encounter (HOSPITAL_COMMUNITY): Payer: Medicare Other

## 2016-05-08 NOTE — Telephone Encounter (Signed)
Spoke w/wife.  They will pick up RX for walker with seat in the morning.

## 2016-05-09 ENCOUNTER — Other Ambulatory Visit: Payer: Self-pay | Admitting: *Deleted

## 2016-05-09 ENCOUNTER — Other Ambulatory Visit (INDEPENDENT_AMBULATORY_CARE_PROVIDER_SITE_OTHER): Payer: Medicare Other

## 2016-05-09 DIAGNOSIS — E118 Type 2 diabetes mellitus with unspecified complications: Secondary | ICD-10-CM

## 2016-05-09 DIAGNOSIS — R5383 Other fatigue: Secondary | ICD-10-CM

## 2016-05-09 DIAGNOSIS — Z794 Long term (current) use of insulin: Secondary | ICD-10-CM

## 2016-05-09 DIAGNOSIS — R918 Other nonspecific abnormal finding of lung field: Secondary | ICD-10-CM

## 2016-05-09 DIAGNOSIS — IMO0002 Reserved for concepts with insufficient information to code with codable children: Secondary | ICD-10-CM

## 2016-05-09 DIAGNOSIS — E1165 Type 2 diabetes mellitus with hyperglycemia: Secondary | ICD-10-CM

## 2016-05-11 ENCOUNTER — Other Ambulatory Visit: Payer: Medicare Other

## 2016-05-11 DIAGNOSIS — R918 Other nonspecific abnormal finding of lung field: Secondary | ICD-10-CM | POA: Diagnosis not present

## 2016-05-11 DIAGNOSIS — Z794 Long term (current) use of insulin: Secondary | ICD-10-CM | POA: Diagnosis not present

## 2016-05-11 DIAGNOSIS — E118 Type 2 diabetes mellitus with unspecified complications: Secondary | ICD-10-CM | POA: Diagnosis not present

## 2016-05-11 DIAGNOSIS — E1165 Type 2 diabetes mellitus with hyperglycemia: Secondary | ICD-10-CM | POA: Diagnosis not present

## 2016-05-11 DIAGNOSIS — R5383 Other fatigue: Secondary | ICD-10-CM | POA: Diagnosis not present

## 2016-05-11 LAB — BAYER DCA HB A1C WAIVED: HB A1C (BAYER DCA - WAIVED): 7.8 % — ABNORMAL HIGH (ref <7.0)

## 2016-05-12 LAB — THYROID PANEL WITH TSH
FREE THYROXINE INDEX: 1.6 (ref 1.2–4.9)
T3 UPTAKE RATIO: 27 % (ref 24–39)
T4, Total: 6.1 ug/dL (ref 4.5–12.0)
TSH: 3.25 u[IU]/mL (ref 0.450–4.500)

## 2016-05-15 ENCOUNTER — Ambulatory Visit: Payer: Medicare Other | Admitting: Family Medicine

## 2016-05-15 LAB — HOMOVANILLIC ACID, 24-HR URINE
HOMOVANILLIC ACID 24H UR: 4 mg/(24.h) (ref 0.0–10.0)
HVA, URINE: 1.6 mg/L

## 2016-05-16 ENCOUNTER — Telehealth: Payer: Self-pay | Admitting: Family Medicine

## 2016-05-16 NOTE — Telephone Encounter (Signed)
Patient aware of results.

## 2016-05-23 ENCOUNTER — Ambulatory Visit: Payer: Medicare Other | Admitting: Family Medicine

## 2016-05-29 ENCOUNTER — Encounter: Payer: Self-pay | Admitting: Family Medicine

## 2016-05-29 ENCOUNTER — Ambulatory Visit (INDEPENDENT_AMBULATORY_CARE_PROVIDER_SITE_OTHER): Payer: Medicare Other | Admitting: Family Medicine

## 2016-05-29 DIAGNOSIS — S32000S Wedge compression fracture of unspecified lumbar vertebra, sequela: Secondary | ICD-10-CM

## 2016-05-29 MED ORDER — HYDROCODONE-ACETAMINOPHEN 10-325 MG PO TABS: 1.0000 | ORAL_TABLET | Freq: Four times a day (QID) | ORAL | 0 refills | Status: DC | PRN

## 2016-05-29 NOTE — Progress Notes (Signed)
Subjective:  Patient ID: [name], male    DOB: October 30, 1945  Age: 70 y.o. MRN: [id number]  CC: Medication Refill (pt here today for refill on pain medication)   HPI DEYVI BONANNO presents for Recheck of low back pain. Patient states that he is concerned about using the medication long-term. He would like to have epidural steroid injections because his brother-in-law gets them and they help him  Pain assessment: Cause of pain- vertebral compression fracture. This was not treated due to a persistent pneumonia which occurred concurrent with the fracture Pain location- proximately L3 region Pain on scale of 1-10- 5-6/10 Frequency-daily What increases pain-bending twisting What makes pain Better-nothing other than the medication Effects on ADL - made more difficult Any change in general medical condition-none  Current medications- hydrocodone 5 mg 4 times a day as one half of a 10 mg Norco Effectiveness of current meds-adequate Adverse reactions form pain meds-denied Morphine equivalent 20  Pill count performed-Yes Urine drug screen- No Was the NCCSR reviewed- no  History Jatavion has a past medical history of AAA (abdominal aortic aneurysm) (HCC); Anemia; Ataxia; Diabetes mellitus without complication (HCC); History of gallstones; Hyperlipidemia; Hypertension; and Vitamin D deficiency.   He has a past surgical history that includes ERCP; Inguinal hernia repair; and Tonsillectomy.   His family history includes Diabetes in his brother and mother; Hip fracture in his mother; Hyperlipidemia in his brother; Hypertension in his brother; Lupus in his sister; Pulmonary embolism in his mother; Scleroderma in his sister.He reports that he quit smoking about 7 years ago. His smoking use included Cigarettes. He started smoking about 49 years ago. He has a 40.00 pack-year smoking history. He has never used smokeless tobacco. He reports that he does not drink alcohol or use drugs.    ROS Review  of Systems  Constitutional: Negative for chills, diaphoresis, fever and unexpected weight change.  HENT: Negative for congestion, hearing loss, rhinorrhea and sore throat.   Eyes: Negative for visual disturbance.  Respiratory: Negative for cough and shortness of breath.   Cardiovascular: Negative for chest pain.  Gastrointestinal: Negative for abdominal pain, constipation and diarrhea.  Genitourinary: Negative for dysuria and flank pain.  Musculoskeletal: Positive for arthralgias, back pain and myalgias. Negative for joint swelling.  Skin: Negative for rash.  Neurological: Negative for dizziness and headaches.  Psychiatric/Behavioral: Negative for dysphoric mood and sleep disturbance.    Objective:  BP 115/75   Pulse 73   Temp (!) 96.9 F (36.1 C) (Oral)   Ht 6' (1.829 m)   Wt 184 lb (83.5 kg)   BMI 24.95 kg/m   BP Readings from Last 3 Encounters:  05/29/16 115/75  04/12/16 104/67  04/05/16 100/62    Wt Readings from Last 3 Encounters:  05/29/16 184 lb (83.5 kg)  04/12/16 178 lb (80.7 kg)  04/05/16 177 lb (80.3 kg)     Physical Exam  Constitutional: He appears well-developed and well-nourished.  HENT:  Head: Normocephalic and atraumatic.  Right Ear: Tympanic membrane and external ear normal. No decreased hearing is noted.  Left Ear: Tympanic membrane and external ear normal. No decreased hearing is noted.  Mouth/Throat: No oropharyngeal exudate or posterior oropharyngeal erythema.  Eyes: Pupils are equal, round, and reactive to light.  Neck: Normal range of motion. Neck supple.  Cardiovascular: Normal rate and regular rhythm.   No murmur heard. Pulmonary/Chest: Breath sounds normal. No respiratory distress.  Abdominal: Soft. Bowel sounds are normal.  Vitals reviewed.    Lab  Results  Component Value Date   WBC 10.8 02/06/2013   HGB 15.4 02/06/2013   HCT 43.9 02/06/2013   PLT 202 02/06/2013   GLUCOSE 197 (H) 02/02/2016   CHOL 185 11/02/2015   TRIG 211 (H)  11/02/2015   HDL 44 11/02/2015   LDLCALC 99 11/02/2015   ALT 107 (H) 02/02/2016   AST 67 (H) 02/02/2016   NA 138 02/02/2016   K 4.1 02/02/2016   CL 100 02/02/2016   CREATININE 1.06 02/02/2016   BUN 17 02/02/2016   CO2 23 02/02/2016   TSH 3.250 05/11/2016   HGBA1C 7.4 07/14/2015   MICROALBUR 20 09/24/2014    Ct Chest Wo Contrast  Result Date: 03/28/2016 CLINICAL DATA:  Abnormal chest radiograph. EXAM: CT CHEST WITHOUT CONTRAST TECHNIQUE: Multidetector CT imaging of the chest was performed following the standard protocol without IV contrast. COMPARISON:  Chest radiograph March 14, 2016; chest CT January 21, 2016 FINDINGS: Cardiovascular: There is no apparent thoracic aortic aneurysm. Visualized great vessels show mild atherosclerotic calcification but otherwise no abnormality on this noncontrast enhanced study. There are scattered foci of atherosclerotic calcification in the aorta. There are multiple foci of coronary artery calcification. The pericardium is not appreciably thickened. Mediastinum/Nodes: Thyroid appears unremarkable. There are scattered subcentimeter mediastinal lymph nodes which are stable. No adenopathy by size criteria is apparent. There is a small hiatal hernia. Lungs/Pleura: There is extensive underlying emphysematous change with multiple bullae. There is bibasilar fibrotic type change as well as areas of primarily lower lobe bronchiectatic change bilaterally. The previously noted airspace consolidation in the right lower lobe has resolved. There is mild scarring in this area. There is no evident parenchymal lung mass. There are areas of mild pleural thickening in the lung base regions, stable. Upper Abdomen: Visualized upper abdomen appears unremarkable. Musculoskeletal: Collapse of the L1 vertebral body is partially visualized on this study, better seen on prior CT. Prior which fracture at T6 is stable. There are multiple focal areas of degenerative type change. There are no  blastic or lytic bone lesions. IMPRESSION: Underlying bullous emphysematous change with parenchymal lung changes most consistent with usual interstitial pneumonitis. Fibrotic change is predominant in the bases, typical for usual interstitial pneumonitis. There is no frank edema or consolidation. No mass or adenopathy is evident currently. There are scattered foci of atherosclerotic calcification as well as foci of coronary artery calcification noted. Electronically Signed   By: Bretta BangWilliam  Woodruff III M.D.   On: 03/28/2016 08:50   Mr Lumbar Spine Wo Contrast  Result Date: 03/28/2016 CLINICAL DATA:  L1 compression fracture. Low back pain after fall. Subsequent encounter EXAM: MRI LUMBAR SPINE WITHOUT CONTRAST TECHNIQUE: Multiplanar, multisequence MR imaging of the lumbar spine was performed. No intravenous contrast was administered. COMPARISON:  Lumbar spine CT 01/08/2016 FINDINGS: Segmentation:  Standard. Alignment:  Mild kyphotic deformity due to L1 fracture. Vertebrae: L1 vertebral body fracture with advanced central height loss that is progressed from prior. No height loss along anterior or posterior cortex. There is mild residual marrow edema along the fracture. No perispinal edema. No significant retropulsion or canal stenosis. No new fracture. No evidence of underlying bone lesion or discitis. Conus medullaris: Extends to the L1 level and appears normal. Paraspinal and other soft tissues: Abdominal aortic aneurysm also reported on lumbar CT, measuring up to 35 mm on sagittal acquisition. 2 cm left renal cyst. Disc levels: T12- L1: No impingement L1-L2: Mild annulus bulging.  Negative facets.  No impingement L2-L3: Mild annulus bulging.  Negative facets.  No impingement L3-L4: Right eccentric disc bulging. Negative facets. No impingement L4-L5: Right eccentric disc bulging. Facet arthropathy with joint effusion on the left. No impingement L5-S1:Disc narrowing and bulging with small noncompressive left  paracentral disc protrusion. Minor facet spurring. Negative for impingement. IMPRESSION: 1. Nearly healed L1 body fracture. Central height loss progressed since 01/08/2016; no retropulsion or canal stenosis. 2. Noncompressive disc and facet degeneration as described above. 3. **An incidental finding of potential clinical significance has been found. 35 mm abdominal aortic aneurysm. Recommend followup by ultrasound in 2 years. This recommendation follows ACR consensus guidelines: White Paper of the ACR Incidental Findings Committee II on Vascular Findings. J Am Coll Radiol 2013; 10:789-794.** Electronically Signed   By: Marnee SpringJonathon  Watts M.D.   On: 03/28/2016 09:26    Assessment & Plan:   [name] was seen today for medication refill.  Diagnoses and all orders for this visit:  Lumbar compression fracture, sequela -     Ambulatory referral to Pain Clinic -     HYDROcodone-acetaminophen (NORCO) 10-325 MG tablet; Take 1 tablet by mouth 4 (four) times daily as needed for moderate pain or severe pain.  Pain clinic referral due to this pain which had been treated up until now as acute is now becoming chronic. Although the patient has exceeded 90 days, the complicated nature of his situation demanded that the normal time of healing be extended. I believe he is closing in on the limits I can offer based on this. Therefore we discussed weaning off the medication slowly this month by going to 3 tabs a day for several days followed by 2 halves and then one half per day. He opted instead for pain clinic referral.  I have discontinued Mr. Killam's ondansetron. I am also having him maintain his aspirin, omeprazole, Insulin Glargine, Albiglutide, rivastigmine, and HYDROcodone-acetaminophen.  Meds ordered this encounter  Medications  . HYDROcodone-acetaminophen (NORCO) 10-325 MG tablet    Sig: Take 1 tablet by mouth 4 (four) times daily as needed for moderate pain or severe pain.    Dispense:  120 tablet    Refill:   0     Follow-up: Return in about 1 month (around 06/28/2016).  Mechele ClaudeWarren Isaac Dubie, M.D.

## 2016-06-01 ENCOUNTER — Other Ambulatory Visit: Payer: Self-pay | Admitting: Family Medicine

## 2016-06-09 ENCOUNTER — Other Ambulatory Visit: Payer: Self-pay | Admitting: Family Medicine

## 2016-06-16 ENCOUNTER — Other Ambulatory Visit: Payer: Self-pay | Admitting: Family Medicine

## 2016-06-16 DIAGNOSIS — E118 Type 2 diabetes mellitus with unspecified complications: Secondary | ICD-10-CM

## 2016-06-16 DIAGNOSIS — Z794 Long term (current) use of insulin: Principal | ICD-10-CM

## 2016-06-16 DIAGNOSIS — IMO0002 Reserved for concepts with insufficient information to code with codable children: Secondary | ICD-10-CM

## 2016-06-16 DIAGNOSIS — E1165 Type 2 diabetes mellitus with hyperglycemia: Secondary | ICD-10-CM

## 2016-07-04 ENCOUNTER — Telehealth: Payer: Self-pay | Admitting: Family Medicine

## 2016-07-04 NOTE — Telephone Encounter (Signed)
Please write note asking for pt. To be excused froom jury duty because of multiple medical conditions including chronic back pain, brittle diabetes and dementia

## 2016-07-04 NOTE — Telephone Encounter (Signed)
Letter ready for pick up

## 2016-07-18 DIAGNOSIS — S32010A Wedge compression fracture of first lumbar vertebra, initial encounter for closed fracture: Secondary | ICD-10-CM | POA: Diagnosis not present

## 2016-07-24 ENCOUNTER — Other Ambulatory Visit: Payer: Self-pay | Admitting: Family Medicine

## 2016-07-24 DIAGNOSIS — G5731 Lesion of lateral popliteal nerve, right lower limb: Secondary | ICD-10-CM | POA: Diagnosis not present

## 2016-07-24 DIAGNOSIS — G301 Alzheimer's disease with late onset: Secondary | ICD-10-CM | POA: Diagnosis not present

## 2016-07-24 DIAGNOSIS — F0280 Dementia in other diseases classified elsewhere without behavioral disturbance: Secondary | ICD-10-CM | POA: Diagnosis not present

## 2016-07-24 DIAGNOSIS — R482 Apraxia: Secondary | ICD-10-CM | POA: Diagnosis not present

## 2016-07-31 ENCOUNTER — Encounter: Payer: Self-pay | Admitting: Family Medicine

## 2016-07-31 ENCOUNTER — Ambulatory Visit (INDEPENDENT_AMBULATORY_CARE_PROVIDER_SITE_OTHER): Payer: Medicare Other | Admitting: Family Medicine

## 2016-07-31 ENCOUNTER — Ambulatory Visit (INDEPENDENT_AMBULATORY_CARE_PROVIDER_SITE_OTHER): Payer: Medicare Other

## 2016-07-31 VITALS — BP 104/62 | HR 76 | Temp 97.1°F | Ht 72.0 in | Wt 183.0 lb

## 2016-07-31 DIAGNOSIS — I10 Essential (primary) hypertension: Secondary | ICD-10-CM | POA: Diagnosis not present

## 2016-07-31 DIAGNOSIS — Z794 Long term (current) use of insulin: Secondary | ICD-10-CM

## 2016-07-31 DIAGNOSIS — S32000S Wedge compression fracture of unspecified lumbar vertebra, sequela: Secondary | ICD-10-CM | POA: Diagnosis not present

## 2016-07-31 DIAGNOSIS — E782 Mixed hyperlipidemia: Secondary | ICD-10-CM

## 2016-07-31 DIAGNOSIS — R911 Solitary pulmonary nodule: Secondary | ICD-10-CM

## 2016-07-31 DIAGNOSIS — E1165 Type 2 diabetes mellitus with hyperglycemia: Secondary | ICD-10-CM | POA: Diagnosis not present

## 2016-07-31 DIAGNOSIS — R809 Proteinuria, unspecified: Secondary | ICD-10-CM | POA: Diagnosis not present

## 2016-07-31 DIAGNOSIS — H60542 Acute eczematoid otitis externa, left ear: Secondary | ICD-10-CM

## 2016-07-31 DIAGNOSIS — IMO0002 Reserved for concepts with insufficient information to code with codable children: Secondary | ICD-10-CM

## 2016-07-31 DIAGNOSIS — E1129 Type 2 diabetes mellitus with other diabetic kidney complication: Secondary | ICD-10-CM | POA: Diagnosis not present

## 2016-07-31 DIAGNOSIS — H6121 Impacted cerumen, right ear: Secondary | ICD-10-CM | POA: Diagnosis not present

## 2016-07-31 DIAGNOSIS — E118 Type 2 diabetes mellitus with unspecified complications: Secondary | ICD-10-CM

## 2016-07-31 MED ORDER — GLUCOSE BLOOD VI STRP: ORAL_STRIP | 12 refills | Status: DC

## 2016-07-31 MED ORDER — FREESTYLE LITE DEVI: 1 refills | Status: DC

## 2016-07-31 MED ORDER — HYDROCODONE-ACETAMINOPHEN 10-325 MG PO TABS
1.0000 | ORAL_TABLET | Freq: Four times a day (QID) | ORAL | 0 refills | Status: DC | PRN
Start: 2016-07-31 — End: 2017-01-29

## 2016-07-31 NOTE — Patient Instructions (Addendum)
Put a small amount of vinegar in   1/2 cup of warm water - enough to barely detect the vinegar odorl.  Mix. Then place 3-5 drops in the ear canal. Use every 2 hours as needed for itching in the ear  Earwax removal:  Debrox drops are available without a prescription at your pharmacy.  Lay on your side with the ear up that you want to treat. Place for 5 drops of the Debrox in the ear canal and lay still for 15 minutes. After that time you considered up and allow the excess to run out of the year and catch it with a Kleenex. Repeat this with the other ear if needed.  Repeat this process daily for 1 week. By that time the ear should feel less clogged and her hearing should be better, if not, follow up in the office for recheck of the ear.  Thanks, Fluor CorporationWarren Jenasia [name]

## 2016-07-31 NOTE — Progress Notes (Signed)
Subjective:  Patient ID: Jon [name], male    DOB: 10-Jan-1946  Age: 71 y.o. MRN: [id number]  CC: Diabetes (pt here today for routine follow up on diabetes, he also needs an rx for Norco)   HPI [name] presents forFollow-up of diabetes. Patient checks blood sugar at home.  130 fasting and not checking postprandial Patient denies symptoms such as polyuria, polydipsia, excessive hunger, nausea No significant hypoglycemic spells noted. Medications as noted below. Taking them regularly without complication/adverse reaction being reported today. Checking feet daily.  Pain assessment: Cause of pain- lumbar compression fx. Considering lumbar spinal injections. Very worried that it will cause her sugar to go up. His brother took the injections and his sugar went up to 600.Pain location- lower back Pain on scale of 1-10- 3-5   Frequency-some pain most days What increases pain-when he is physically active What makes pain Better-rest and medication Effects on ADL - able to do most ADLs without difficulty. Any change in general medical condition-none  Current medications- Norco 10 Effectiveness of current meds-adequate Adverse reactions form pain meds-denied Morphine equivalent 10-40. Currently only taking 10, prescription allow up to 40 Pill count performed-No Urine drug screen- No Was the [place] reviewed- yes  If yes were their any concerning findings? - No, scanned   History Oshea has a past medical history of AAA (abdominal aortic aneurysm) ([facility]); Anemia; Ataxia; Diabetes mellitus without complication ([place]); History of gallstones; Hyperlipidemia; Hypertension; and Vitamin D deficiency.   He has a past surgical history that includes ERCP; Inguinal hernia repair; and Tonsillectomy.   His family history includes Diabetes in his brother and mother; Hip fracture in his mother; Hyperlipidemia in his brother; Hypertension in his brother; Lupus in his sister; Pulmonary embolism in his  mother; Scleroderma in his sister.He reports that he quit smoking about 7 years ago. His smoking use included Cigarettes. He started smoking about 50 years ago. He has a 40.00 pack-year smoking history. He has never used smokeless tobacco. He reports that he does not drink alcohol or use drugs.  Current Outpatient Prescriptions on File Prior to Visit  Medication Sig Dispense Refill  . Albiglutide (TANZEUM) 30 MG PEN Inject 30 mg subcutaneously once a a week. 12 each 3  . aspirin 81 MG tablet Take 81 mg by mouth daily.    [name] glucose blood (FREESTYLE LITE) test strip Test up to 3 times daily. DX: E11.8 200 each 2  . Lancets (FREESTYLE) lancets USE TO CHECK BLOOD GLUCOSE UP TO TWICE A DAY FOR TYPE 2 DIABETES MELLITUS WITH COMPLICATIONS AND LONG TERM INSULIN USE 200 each 1  . LANTUS SOLOSTAR 100 UNIT/ML Solostar Pen INJECT 42 UNITS UNDER THE SKIN TWICE A DAY 90 mL 1  . omeprazole (PRILOSEC) 20 MG capsule Take 1 capsule (20 mg total) by mouth daily. 90 capsule 1  . rivastigmine (EXELON) 1.5 MG capsule Take 1-2 capsules (1.5-3 mg total) by mouth as directed. 90 capsule 2   No current facility-administered medications on file prior to visit.     ROS Review of Systems  Constitutional: Negative for chills, diaphoresis, fever and unexpected weight change.  HENT: Positive for ear pain (left canal itches). Negative for congestion, hearing loss, rhinorrhea and sore throat.   Eyes: Negative for visual disturbance.  Respiratory: Negative for cough and shortness of breath.   Cardiovascular: Negative for chest pain.  Gastrointestinal: Negative for abdominal pain, constipation and diarrhea.  Genitourinary: Negative for dysuria and flank pain.  Musculoskeletal: Negative for  arthralgias and joint swelling.  Skin: Negative for rash.  Neurological: Negative for dizziness and headaches.  Psychiatric/Behavioral: Negative for dysphoric mood and sleep disturbance.    Objective:  BP 104/62   Pulse 76   Temp  97.1 F (36.2 C) (Oral)   Ht 6' (1.829 m)   Wt 183 lb (83 kg)   BMI 24.82 kg/m   BP Readings from Last 3 Encounters:  07/31/16 104/62  05/29/16 115/75  04/12/16 104/67    Wt Readings from Last 3 Encounters:  07/31/16 183 lb (83 kg)  05/29/16 184 lb (83.5 kg)  04/12/16 178 lb (80.7 kg)     Physical Exam  Constitutional: He is oriented to person, place, and time. He appears well-developed and well-nourished. No distress.  HENT:  Head: Normocephalic and atraumatic.  Right Ear: Hearing and external ear normal.  Left Ear: Hearing and external ear normal.  Nose: Nose normal.  Mouth/Throat: Oropharynx is clear and moist.  Right ear canal filled with cerumen Left ear canal with minimal cerumen. some fine scale is noted  Eyes: Conjunctivae and EOM are normal. Pupils are equal, round, and reactive to light.  Neck: Normal range of motion. Neck supple. No thyromegaly present.  Cardiovascular: Normal rate, regular rhythm and normal heart sounds.   No murmur heard. Pulmonary/Chest: Effort normal and breath sounds normal. No respiratory distress. He has no wheezes. He has no rales.  Abdominal: Soft. Bowel sounds are normal. He exhibits no distension. There is no tenderness.  Lymphadenopathy:    He has no cervical adenopathy.  Neurological: He is alert and oriented to person, place, and time. He has normal reflexes.  Skin: Skin is warm and dry.  Psychiatric: He has a normal mood and affect. His behavior is normal. Judgment and thought content normal.    Lab Results  Component Value Date   HGBA1C 7.4 07/14/2015   HGBA1C 8.4 03/12/2015   HGBA1C 13.0 03/09/2015    Lab Results  Component Value Date   WBC 10.8 02/06/2013   HGB 15.4 02/06/2013   HCT 43.9 02/06/2013   PLT 202 02/06/2013   GLUCOSE 197 (H) 02/02/2016   CHOL 185 11/02/2015   TRIG 211 (H) 11/02/2015   HDL 44 11/02/2015   LDLCALC 99 11/02/2015   ALT 107 (H) 02/02/2016   AST 67 (H) 02/02/2016   NA 138 02/02/2016    K 4.1 02/02/2016   CL 100 02/02/2016   CREATININE 1.06 02/02/2016   BUN 17 02/02/2016   CO2 23 02/02/2016   TSH 3.250 05/11/2016   HGBA1C 7.4 07/14/2015   MICROALBUR 20 09/24/2014     Assessment & Plan:   Chay was seen today for diabetes.  Diagnoses and all orders for this visit:  Uncontrolled type 2 diabetes mellitus with complication, with long-term current use of insulin ([place]) -     CBC with Differential/Platelet; Future -     CMP14+EGFR; Future -     Lipid panel; Future -     Bayer DCA Hb A1c Waived; Future  Essential hypertension -     CMP14+EGFR; Future  Mixed hyperlipidemia -     CMP14+EGFR; Future -     Lipid panel; Future  Microalbuminuria due to type 2 diabetes mellitus ([place]) -     CMP14+EGFR; Future  Solitary pulmonary nodule -     DG Chest 2 View; Future  Lumbar compression fracture, sequela -     HYDROcodone-acetaminophen (NORCO) 10-325 MG tablet; Take 1 tablet by mouth 4 (four) times daily  as needed for moderate pain or severe pain.  Impacted cerumen of right ear  Eczema of external ear, left  Other orders -     Blood Glucose Monitoring Suppl (FREESTYLE LITE) DEVI; Test twice daily -     glucose blood (FREESTYLE LITE) test strip; Use as instructed    Put a small amount of vinegar in   1/2 cup of warm water - enough to barely detect the vinegar odorl.  Mix. Then place 3-5 drops in the ear canal. Use every 2 hours as needed for itching in the ear  Earwax removal:  Debrox drops are available without a prescription at your pharmacy.  Lay on your side with the ear up that you want to treat. Place for 5 drops of the Debrox in the ear canal and lay still for 15 minutes. After that time you considered up and allow the excess to run out of the year and catch it with a Kleenex. Repeat this with the other ear if needed.  Repeat this process daily for 1 week. By that time the ear should feel less clogged and her hearing should be better, if not, follow up in  the office for recheck of the ear.  Thanks, Claretta Fraise  I am having Mr. Donath start on FREESTYLE LITE and glucose blood. I am also having him maintain his aspirin, omeprazole, Albiglutide, rivastigmine, LANTUS SOLOSTAR, glucose blood, freestyle, and HYDROcodone-acetaminophen.  Meds ordered this encounter  Medications  . Blood Glucose Monitoring Suppl (FREESTYLE LITE) DEVI    Sig: Test twice daily    Dispense:  1 each    Refill:  1  . glucose blood (FREESTYLE LITE) test strip    Sig: Use as instructed    Dispense:  100 each    Refill:  12  . HYDROcodone-acetaminophen (NORCO) 10-325 MG tablet    Sig: Take 1 tablet by mouth 4 (four) times daily as needed for moderate pain or severe pain.    Dispense:  60 tablet    Refill:  0     Follow-up: Return in about 3 months (around 11/06/2016) for diabetes.  Claretta Fraise, M.D.

## 2016-08-11 ENCOUNTER — Other Ambulatory Visit: Payer: Medicare Other

## 2016-08-11 ENCOUNTER — Other Ambulatory Visit: Payer: Self-pay | Admitting: Family Medicine

## 2016-08-11 DIAGNOSIS — E1165 Type 2 diabetes mellitus with hyperglycemia: Secondary | ICD-10-CM

## 2016-08-11 DIAGNOSIS — Z794 Long term (current) use of insulin: Principal | ICD-10-CM

## 2016-08-11 DIAGNOSIS — IMO0002 Reserved for concepts with insufficient information to code with codable children: Secondary | ICD-10-CM

## 2016-08-11 DIAGNOSIS — E782 Mixed hyperlipidemia: Secondary | ICD-10-CM

## 2016-08-11 DIAGNOSIS — E118 Type 2 diabetes mellitus with unspecified complications: Principal | ICD-10-CM

## 2016-08-11 DIAGNOSIS — I10 Essential (primary) hypertension: Secondary | ICD-10-CM

## 2016-08-11 DIAGNOSIS — E1129 Type 2 diabetes mellitus with other diabetic kidney complication: Secondary | ICD-10-CM

## 2016-08-11 DIAGNOSIS — R809 Proteinuria, unspecified: Secondary | ICD-10-CM | POA: Diagnosis not present

## 2016-08-11 LAB — CBC WITH DIFFERENTIAL/PLATELET
BASOS: 0 %
Basophils Absolute: 0 10*3/uL (ref 0.0–0.2)
EOS (ABSOLUTE): 0.3 10*3/uL (ref 0.0–0.4)
EOS: 3 %
HEMATOCRIT: 46.1 % (ref 37.5–51.0)
HEMOGLOBIN: 15.9 g/dL (ref 13.0–17.7)
Immature Grans (Abs): 0 10*3/uL (ref 0.0–0.1)
Immature Granulocytes: 0 %
LYMPHS ABS: 3.4 10*3/uL — AB (ref 0.7–3.1)
Lymphs: 30 %
MCH: 29.9 pg (ref 26.6–33.0)
MCHC: 34.5 g/dL (ref 31.5–35.7)
MCV: 87 fL (ref 79–97)
MONOCYTES: 8 %
Monocytes Absolute: 1 10*3/uL — ABNORMAL HIGH (ref 0.1–0.9)
Neutrophils Absolute: 6.6 10*3/uL (ref 1.4–7.0)
Neutrophils: 59 %
Platelets: 197 10*3/uL (ref 150–379)
RBC: 5.31 x10E6/uL (ref 4.14–5.80)
RDW: 13.4 % (ref 12.3–15.4)
WBC: 11.4 10*3/uL — ABNORMAL HIGH (ref 3.4–10.8)

## 2016-08-11 LAB — CMP14+EGFR
ALBUMIN: 3.8 g/dL (ref 3.6–4.8)
ALK PHOS: 199 IU/L — AB (ref 39–117)
ALT: 39 IU/L (ref 0–44)
AST: 33 IU/L (ref 0–40)
Albumin/Globulin Ratio: 1.1 — ABNORMAL LOW (ref 1.2–2.2)
BILIRUBIN TOTAL: 0.6 mg/dL (ref 0.0–1.2)
BUN / CREAT RATIO: 17 (ref 10–24)
BUN: 16 mg/dL (ref 8–27)
CHLORIDE: 100 mmol/L (ref 96–106)
CO2: 23 mmol/L (ref 18–29)
Calcium: 9.2 mg/dL (ref 8.6–10.2)
Creatinine, Ser: 0.96 mg/dL (ref 0.76–1.27)
GFR calc non Af Amer: 81 mL/min/{1.73_m2} (ref >59)
GFR, EST AFRICAN AMERICAN: 94 mL/min/{1.73_m2} (ref >59)
GLOBULIN, TOTAL: 3.4 g/dL (ref 1.5–4.5)
Glucose: 189 mg/dL — ABNORMAL HIGH (ref 65–99)
Potassium: 4.2 mmol/L (ref 3.5–5.2)
SODIUM: 138 mmol/L (ref 134–144)
Total Protein: 7.2 g/dL (ref 6.0–8.5)

## 2016-08-11 LAB — LIPID PANEL
CHOL/HDL RATIO: 3.2 ratio (ref 0.0–5.0)
Cholesterol, Total: 159 mg/dL (ref 100–199)
HDL: 49 mg/dL (ref >39)
LDL Calculated: 88 mg/dL (ref 0–99)
Triglycerides: 110 mg/dL (ref 0–149)
VLDL Cholesterol Cal: 22 mg/dL (ref 5–40)

## 2016-08-11 LAB — BAYER DCA HB A1C WAIVED: HB A1C: 8.9 % — AB (ref <7.0)

## 2016-08-11 MED ORDER — ALBIGLUTIDE 50 MG ~~LOC~~ PEN: 50.0000 mg | PEN_INJECTOR | SUBCUTANEOUS | 11 refills | Status: DC

## 2016-08-11 NOTE — Progress Notes (Signed)
Please contact the patient regarding: His hemoglobin A1c was much higher than acceptable. I recommend his dose of Tanzeum be increased to 50 mg weekly. He should follow-up after 4 weeks of that treatment. Please check blood glucose twice daily, fasting and 2 hours after supper. Right those readings down and bring them with you to the appointment. Start the higher dose of Tanzeum within the next shot would be due normally. [facility]

## 2016-08-15 ENCOUNTER — Ambulatory Visit: Payer: Medicare Other | Admitting: Family Medicine

## 2016-08-22 ENCOUNTER — Telehealth: Payer: Self-pay | Admitting: Family Medicine

## 2016-08-24 ENCOUNTER — Other Ambulatory Visit: Payer: Self-pay | Admitting: *Deleted

## 2016-08-24 MED ORDER — ALBIGLUTIDE 50 MG ~~LOC~~ PEN: 50.0000 mg | PEN_INJECTOR | SUBCUTANEOUS | 1 refills | Status: DC

## 2016-08-24 NOTE — Telephone Encounter (Signed)
Okay at current level for 6 mos. Thanks ws 

## 2016-08-24 NOTE — Progress Notes (Signed)
[facility] for [facility] per pt request Okayed per Dr Darlyn ReadStacks

## 2016-08-28 ENCOUNTER — Telehealth: Payer: Self-pay | Admitting: Family Medicine

## 2016-08-28 NOTE — Telephone Encounter (Signed)
Per pt's wife, pt has fever, sneezing and elvated BS appt scheduled for evaluation

## 2016-08-29 ENCOUNTER — Ambulatory Visit (INDEPENDENT_AMBULATORY_CARE_PROVIDER_SITE_OTHER): Payer: Medicare Other | Admitting: Family Medicine

## 2016-08-29 ENCOUNTER — Encounter: Payer: Self-pay | Admitting: Family Medicine

## 2016-08-29 VITALS — BP 109/68 | HR 83 | Temp 98.2°F | Ht 72.0 in | Wt 187.0 lb

## 2016-08-29 DIAGNOSIS — Z794 Long term (current) use of insulin: Secondary | ICD-10-CM | POA: Diagnosis not present

## 2016-08-29 DIAGNOSIS — IMO0002 Reserved for concepts with insufficient information to code with codable children: Secondary | ICD-10-CM

## 2016-08-29 DIAGNOSIS — J0100 Acute maxillary sinusitis, unspecified: Secondary | ICD-10-CM | POA: Diagnosis not present

## 2016-08-29 DIAGNOSIS — E118 Type 2 diabetes mellitus with unspecified complications: Secondary | ICD-10-CM | POA: Diagnosis not present

## 2016-08-29 DIAGNOSIS — I10 Essential (primary) hypertension: Secondary | ICD-10-CM

## 2016-08-29 DIAGNOSIS — E1165 Type 2 diabetes mellitus with hyperglycemia: Secondary | ICD-10-CM | POA: Diagnosis not present

## 2016-08-29 MED ORDER — AMOXICILLIN-POT CLAVULANATE 875-125 MG PO TABS: 1.0000 | ORAL_TABLET | Freq: Two times a day (BID) | ORAL | 0 refills | Status: DC

## 2016-08-29 NOTE — Progress Notes (Signed)
Subjective:  Patient ID: Jon [name], male    DOB: 1946/06/30  Age: 71 y.o. MRN: [id number]  CC: Sinusitis (pt here today c/o sneezing, runny nose and sinus pressure)   HPI Jon [name] presents for Symptoms include congestion, facial pain, nasal congestion,No cough and no body aches. Patient does have some hoarseness due to post nasal drip and sinus pressure. There is Low-grade fever. No chills, or sweats. Onset of symptoms was 3 days ago, gradually worsening since that time.   Patient's wife is with him concerned about his blood sugars not being under good control. They started the Tanzeum recently. Brought in blood sugar readings these are attached. They showed that glucose postprandial as high as 375 and generally between 250 and 300 has started to come down somewhat now running in the 240-260 range the last few days. Wife states patient is eating without regard to diet. Patient says he is hungry a lot. Patient demonstrates lack of understanding of glycemic index. He states," everything has sugar in it."  History Jon [name] has a past medical history of AAA (abdominal aortic aneurysm) ([facility]); Anemia; Ataxia; Diabetes mellitus without complication ([facility]); History of gallstones; Hyperlipidemia; Hypertension; and Vitamin D deficiency.   He has a past surgical history that includes ERCP; Inguinal hernia repair; and Tonsillectomy.   His family history includes Diabetes in his brother and mother; Hip fracture in his mother; Hyperlipidemia in his brother; Hypertension in his brother; Lupus in his sister; Pulmonary embolism in his mother; Scleroderma in his sister.He reports that he quit smoking about 7 years ago. His smoking use included Cigarettes. He started smoking about 50 years ago. He has a 40.00 pack-year smoking history. He has never used smokeless tobacco. He reports that he does not drink alcohol or use drugs.  Current Outpatient Prescriptions on File Prior to Visit  Medication Sig Dispense  Refill  . Albiglutide 50 MG PEN Inject 50 mg into the skin once a week. 12 each 1  . aspirin 81 MG tablet Take 81 mg by mouth daily.    . Blood Glucose Monitoring Suppl (FREESTYLE LITE) DEVI Test twice daily 1 each 1  . glucose blood (FREESTYLE LITE) test strip Test up to 3 times daily. DX: E11.8 200 each 2  . glucose blood (FREESTYLE LITE) test strip Use as instructed 100 each 12  . HYDROcodone-acetaminophen (NORCO) 10-325 MG tablet Take 1 tablet by mouth 4 (four) times daily as needed for moderate pain or severe pain. 60 tablet 0  . Lancets (FREESTYLE) lancets USE TO CHECK BLOOD GLUCOSE UP TO TWICE A DAY FOR TYPE 2 DIABETES MELLITUS WITH COMPLICATIONS AND LONG TERM INSULIN USE 200 each 1  . LANTUS SOLOSTAR 100 UNIT/ML Solostar Pen INJECT 42 UNITS UNDER THE SKIN TWICE A DAY (Patient taking differently: pt doing 50 units twice daily) 90 mL 1  . omeprazole (PRILOSEC) 20 MG capsule Take 1 capsule (20 mg total) by mouth daily. 90 capsule 1  . rivastigmine (EXELON) 1.5 MG capsule Take 1-2 capsules (1.5-3 mg total) by mouth as directed. 90 capsule 2   No current facility-administered medications on file prior to visit.     ROS Review of Systems  Constitutional: Negative for activity change, appetite change, chills, diaphoresis and fever.  HENT: Positive for congestion, postnasal drip, rhinorrhea and sinus pressure. Negative for ear discharge, ear pain, hearing loss, nosebleeds, sneezing, sore throat and trouble swallowing.   Respiratory: Negative for cough, chest tightness and shortness of breath.   Cardiovascular: Negative  for chest pain and palpitations.  Gastrointestinal: Negative for abdominal pain.  Musculoskeletal: Negative for arthralgias and myalgias.  Skin: Negative for rash.  Neurological: Negative for weakness and headaches.    Objective:  BP 109/68   Pulse 83   Temp 98.2 F (36.8 C) (Oral)   Ht 6' (1.829 m)   Wt 187 lb (84.8 kg)   BMI 25.36 kg/m   Physical Exam    Constitutional: He appears well-developed and well-nourished.  HENT:  Head: Normocephalic and atraumatic.  Right Ear: Tympanic membrane and external ear normal. No decreased hearing is noted.  Left Ear: Tympanic membrane and external ear normal. No decreased hearing is noted.  Nose: Mucosal edema present. Right sinus exhibits no frontal sinus tenderness. Left sinus exhibits no frontal sinus tenderness.  Mouth/Throat: No oropharyngeal exudate or posterior oropharyngeal erythema.  Eyes: Pupils are equal, round, and reactive to light.  Neck: Normal range of motion. Neck supple. No Brudzinski's sign noted.  Cardiovascular: Normal rate and regular rhythm.   No murmur heard. Pulmonary/Chest: Breath sounds normal. No respiratory distress.  Abdominal: Soft. Bowel sounds are normal. He exhibits no mass. There is no tenderness.  Lymphadenopathy:       Head (right side): No preauricular adenopathy present.       Head (left side): No preauricular adenopathy present.       Right cervical: No superficial cervical adenopathy present.      Left cervical: No superficial cervical adenopathy present.  Vitals reviewed.   Assessment & Plan:   Jon [name] was seen today for sinusitis.  Diagnoses and all orders for this visit:  Uncontrolled type 2 diabetes mellitus with complication, with long-term current use of insulin ([facility])  Essential hypertension  Acute maxillary sinusitis, recurrence not specified  Other orders -     amoxicillin-clavulanate (AUGMENTIN) 875-125 MG tablet; Take 1 tablet by mouth 2 (two) times daily. Take all of this medication   I am having Jon [name] start on amoxicillin-clavulanate. I am also having him maintain his aspirin, omeprazole, rivastigmine, LANTUS SOLOSTAR, glucose blood, freestyle, FREESTYLE LITE, glucose blood, HYDROcodone-acetaminophen, and Albiglutide.  Meds ordered this encounter  Medications  . amoxicillin-clavulanate (AUGMENTIN) 875-125 MG tablet    Sig: Take 1  tablet by mouth 2 (two) times daily. Take all of this medication    Dispense:  20 tablet    Refill:  0    Husband and wife are openly in conflict over diabetic diet. The wife is slender and nondiabetic. She buys groceries and she brings in snack such as fried sickles and cinnamon rolls for her consumption but becomes agitated when the patient eats them. The patient on the other hand says that he gets hungry and desires to snacks and when he sees them laying around attempt patient overwhelms him. Additionally patient is drinking a lot of diet beverages and these seem to be stimulating his appetite for sweets.  Discussion of glycemic index with simple explanations of principles like" White is wrong." Patient seems to understand. He will try to drink more water. Avoid sweet snacks. Wife states she will try to avoid using them and leaving them out in his presence.  Follow-up: As previously arranged for diabetes and as needed for current sinus symptoms.  Mechele ClaudeWarren Kyah Buesing, M.D.

## 2016-08-31 DIAGNOSIS — H40033 Anatomical narrow angle, bilateral: Secondary | ICD-10-CM | POA: Diagnosis not present

## 2016-08-31 DIAGNOSIS — E119 Type 2 diabetes mellitus without complications: Secondary | ICD-10-CM | POA: Diagnosis not present

## 2016-12-11 ENCOUNTER — Telehealth: Payer: Self-pay | Admitting: Family Medicine

## 2016-12-12 NOTE — Telephone Encounter (Signed)
Jon [name] is getting the [facility]'s [facility] from [facility].

## 2017-01-19 ENCOUNTER — Ambulatory Visit: Payer: Medicare Other | Admitting: Family Medicine

## 2017-01-19 ENCOUNTER — Other Ambulatory Visit: Payer: Self-pay | Admitting: Family Medicine

## 2017-01-19 MED ORDER — INSULIN GLARGINE 100 UNIT/ML SOLOSTAR PEN: PEN_INJECTOR | SUBCUTANEOUS | 1 refills | Status: DC

## 2017-01-19 NOTE — Telephone Encounter (Signed)
Sent Lantus to mail order pharmacy with directions as 50 units twice daily per patients request. Wife notified

## 2017-01-29 ENCOUNTER — Encounter: Payer: Self-pay | Admitting: Family Medicine

## 2017-01-29 ENCOUNTER — Ambulatory Visit (INDEPENDENT_AMBULATORY_CARE_PROVIDER_SITE_OTHER): Payer: Medicare Other | Admitting: Family Medicine

## 2017-01-29 VITALS — BP 95/60 | HR 76 | Temp 97.6°F | Ht 72.0 in | Wt 185.0 lb

## 2017-01-29 DIAGNOSIS — G301 Alzheimer's disease with late onset: Secondary | ICD-10-CM | POA: Diagnosis not present

## 2017-01-29 DIAGNOSIS — Z794 Long term (current) use of insulin: Secondary | ICD-10-CM | POA: Diagnosis not present

## 2017-01-29 DIAGNOSIS — J439 Emphysema, unspecified: Secondary | ICD-10-CM | POA: Diagnosis not present

## 2017-01-29 DIAGNOSIS — I10 Essential (primary) hypertension: Secondary | ICD-10-CM | POA: Diagnosis not present

## 2017-01-29 DIAGNOSIS — E118 Type 2 diabetes mellitus with unspecified complications: Secondary | ICD-10-CM | POA: Diagnosis not present

## 2017-01-29 DIAGNOSIS — E1129 Type 2 diabetes mellitus with other diabetic kidney complication: Secondary | ICD-10-CM | POA: Diagnosis not present

## 2017-01-29 DIAGNOSIS — IMO0002 Reserved for concepts with insufficient information to code with codable children: Secondary | ICD-10-CM

## 2017-01-29 DIAGNOSIS — R809 Proteinuria, unspecified: Secondary | ICD-10-CM

## 2017-01-29 DIAGNOSIS — E782 Mixed hyperlipidemia: Secondary | ICD-10-CM | POA: Diagnosis not present

## 2017-01-29 DIAGNOSIS — E1165 Type 2 diabetes mellitus with hyperglycemia: Secondary | ICD-10-CM

## 2017-01-29 DIAGNOSIS — F0280 Dementia in other diseases classified elsewhere without behavioral disturbance: Secondary | ICD-10-CM | POA: Diagnosis not present

## 2017-01-29 MED ORDER — GLUCOSE BLOOD VI STRP: ORAL_STRIP | 3 refills | Status: DC

## 2017-01-29 MED ORDER — ALBIGLUTIDE 50 MG ~~LOC~~ PEN: 50.0000 mg | PEN_INJECTOR | SUBCUTANEOUS | 1 refills | Status: DC

## 2017-01-29 MED ORDER — LISINOPRIL 20 MG PO TABS: 20.0000 mg | ORAL_TABLET | Freq: Every day | ORAL | 3 refills | Status: DC

## 2017-01-29 MED ORDER — TRAMADOL HCL 50 MG PO TABS: 50.0000 mg | ORAL_TABLET | Freq: Four times a day (QID) | ORAL | Status: DC | PRN

## 2017-01-29 MED ORDER — METFORMIN HCL 1000 MG PO TABS: 1000.0000 mg | ORAL_TABLET | Freq: Every day | ORAL | 3 refills | Status: DC

## 2017-01-29 MED ORDER — INSULIN GLARGINE 100 UNIT/ML SOLOSTAR PEN: PEN_INJECTOR | SUBCUTANEOUS | 1 refills | Status: DC

## 2017-01-29 NOTE — Progress Notes (Signed)
Subjective:  Patient ID: Jon [name],  male    DOB: May 31, 1946  Age: 71 y.o.    CC: Diabetes (pt here today for routine follow up of chronic medical conditions)   HPI Jon [name] presents for Having been followed at [facility] for the last several months to save money. He is more comfortable here and he didn't really save that much money so he is back to reestablish at this location with me. There were some changes in his medication. In particular he was maintained on Victoza instead of the once weekly GLP-1. His Lantus as stated 45 units but he wants to go back to the sole*where as they were having him draw his own. Additionally he has not needed hydrocodone except recently he noted some increased back pain and had a few leftover hydrocodone from several months ago. He he says he probably take 2 or 3 in 1 week but it was just that one week. However he does have some residual back pain that is mild to moderate and intermittent. He recently saw his neurologist actually that was earlier today. He is being tapered off of his Namenda and they will titrate the Exelon through the neurologist. [facility] had [facility] and started him on lisinopril dose is unknown. He needs his Accu-Chek nano strips as well as something for pain.   follow-up of hypertension. Patient has no history of headache chest pain or shortness of breath or recent cough. Patient also denies symptoms of TIA such as numbness weakness lateralizing. Patient checks  blood pressure at home. Recent readings have been good Patient denies side effects from medication. States taking it regularly.  Patient also  in for follow-up of elevated cholesterol. Doing well without complaints on current medication. Denies side effects of statin including myalgia and arthralgia and nausea. Also in today for liver function testing. Currently no chest pain, shortness of breath or other cardiovascular related symptoms  noted.  Follow-up of diabetes. Patient does check blood sugar at home. Readings run low to mid 100s. Had recent A1c 7.7 at [place] last week. Patient denies symptoms such as polyuria, polydipsia, excessive hunger, nausea No significant hypoglycemic spells noted. Medications reviewed. Pt reports taking them regularly. Pt. denies complication/adverse reaction today.    History Jon [name] has a past medical history of AAA (abdominal aortic aneurysm) (HCC); Anemia; Ataxia; Diabetes mellitus without complication (HCC); History of gallstones; Hyperlipidemia; Hypertension; and Vitamin D deficiency.   He has a past surgical history that includes ERCP; Inguinal hernia repair; and Tonsillectomy.   His family history includes Diabetes in his brother and mother; Hip fracture in his mother; Hyperlipidemia in his brother; Hypertension in his brother; Lupus in his sister; Pulmonary embolism in his mother; Scleroderma in his sister.He reports that he quit smoking about 7 years ago. His smoking use included Cigarettes. He started smoking about 50 years ago. He has a 40.00 pack-year smoking history. He has never used smokeless tobacco. He reports that he does not drink alcohol or use drugs.  Current Outpatient Prescriptions on File Prior to Visit  Medication Sig Dispense Refill  . aspirin 81 MG tablet Take 81 mg by mouth daily.    [name] omeprazole (PRILOSEC) 20 MG capsule Take 1 capsule (20 mg total) by mouth daily. 90 capsule 1  . rivastigmine (EXELON) 1.5 MG capsule Take 1-2 capsules (1.5-3 mg total) by mouth as directed. 90 capsule 2   No current facility-administered medications on file prior to  visit.     ROS Review of Systems  Constitutional: Negative for chills, diaphoresis, fever and unexpected weight change.  HENT: Negative for congestion, hearing loss, rhinorrhea and sore throat.   Eyes: Negative for visual disturbance.  Respiratory: Positive for shortness of breath (occasional with exertion room air sat with  exertion today 90). Negative for cough.   Cardiovascular: Negative for chest pain.  Gastrointestinal: Negative for abdominal pain, constipation and diarrhea.  Genitourinary: Negative for dysuria and flank pain.  Musculoskeletal: Positive for back pain (when he stoops over his computer for an extended period of time). Negative for arthralgias and joint swelling.  Skin: Negative for rash.  Neurological: Negative for dizziness and headaches.  Psychiatric/Behavioral: Positive for confusion (mild memory impairment) and decreased concentration. Negative for dysphoric mood and sleep disturbance.    Objective:  BP 95/60   Pulse 76   Temp 97.6 F (36.4 C) (Oral)   Ht 6' (1.829 m)   Wt 185 lb (83.9 kg)   SpO2 94%   BMI 25.09 kg/m   BP Readings from Last 3 Encounters:  01/29/17 95/60  08/29/16 109/68  07/31/16 104/62    Wt Readings from Last 3 Encounters:  01/29/17 185 lb (83.9 kg)  08/29/16 187 lb (84.8 kg)  07/31/16 183 lb (83 kg)     Physical Exam  Constitutional: He is oriented to person, place, and time. He appears well-developed and well-nourished. No distress.  HENT:  Head: Normocephalic and atraumatic.  Right Ear: External ear normal.  Left Ear: External ear normal.  Nose: Nose normal.  Mouth/Throat: Oropharynx is clear and moist.  Eyes: Pupils are equal, round, and reactive to light. Conjunctivae and EOM are normal.  Neck: Normal range of motion. Neck supple. No thyromegaly present.  Cardiovascular: Normal rate, regular rhythm and normal heart sounds.   No murmur heard. Pulmonary/Chest: Effort normal and breath sounds normal. No respiratory distress. He has no wheezes. He has no rales.  Abdominal: Soft. Bowel sounds are normal. He exhibits no distension. There is no tenderness.  Lymphadenopathy:    He has no cervical adenopathy.  Neurological: He is alert and oriented to person, place, and time. He has normal reflexes.  Skin: Skin is warm and dry.  Psychiatric: He  has a normal mood and affect. His speech is normal and behavior is normal. Thought content normal. Cognition and memory are impaired.    Diabetic Foot Exam - Simple   No data filed        Assessment & Plan:   Jon [name] was seen today for diabetes.  Diagnoses and all orders for this visit:  Uncontrolled type 2 diabetes mellitus with complication, with long-term current use of insulin (HCC)  Essential hypertension  Mixed hyperlipidemia  Bullous emphysema (HCC)  Microalbuminuria due to type 2 diabetes mellitus (HCC)  Other orders -     Insulin Glargine (LANTUS SOLOSTAR) 100 UNIT/ML Solostar Pen; pt doing 45 units daily -     Albiglutide 50 MG PEN; Inject 50 mg into the skin once a week. -     metFORMIN (GLUCOPHAGE) 1000 MG tablet; Take 1 tablet (1,000 mg total) by mouth daily with breakfast. -     lisinopril (PRINIVIL,ZESTRIL) 20 MG tablet; Take 1 tablet (20 mg total) by mouth daily. -     traMADol (ULTRAM) 50 MG tablet; Take 1 tablet (50 mg total) by mouth 4 (four) times daily as needed for moderate pain. -     glucose blood test strip; Check fasting and 2  hours after biggest meal   I have discontinued Jon [name] glucose blood, freestyle, FREESTYLE LITE, glucose blood, HYDROcodone-acetaminophen, amoxicillin-clavulanate, and memantine. I have also changed his Insulin Glargine and metFORMIN. Additionally, I am having him start on lisinopril, traMADol, and glucose blood. Lastly, I am having him maintain his aspirin, omeprazole, rivastigmine, and Albiglutide.  Meds ordered this encounter  Medications  . DISCONTD: memantine (NAMENDA) 10 MG tablet    Sig: Take by mouth.  . DISCONTD: metFORMIN (GLUCOPHAGE) 1000 MG tablet    Sig: Take by mouth.  . Insulin Glargine (LANTUS SOLOSTAR) 100 UNIT/ML Solostar Pen    Sig: pt doing 45 units daily    Dispense:  45 mL    Refill:  1  . Albiglutide 50 MG PEN    Sig: Inject 50 mg into the skin once a week.    Dispense:  13 each    Refill:  1   . metFORMIN (GLUCOPHAGE) 1000 MG tablet    Sig: Take 1 tablet (1,000 mg total) by mouth daily with breakfast.    Dispense:  90 tablet    Refill:  3  . lisinopril (PRINIVIL,ZESTRIL) 20 MG tablet    Sig: Take 1 tablet (20 mg total) by mouth daily.    Dispense:  90 tablet    Refill:  3  . traMADol (ULTRAM) 50 MG tablet    Sig: Take 1 tablet (50 mg total) by mouth 4 (four) times daily as needed for moderate pain.    Dispense:  60 tablet    Refill:  02  . glucose blood test strip    Sig: Check fasting and 2 hours after biggest meal    Dispense:  200 each    Refill:  3   His wife is concerned about his memory loss and would like some support. I recommended to her the local Alzheimer's foundations and she should read the book The Thirty-Six Hour Day  Follow-up: Return in about 3 months (around 05/01/2017).  Jon ClaudeWarren Raven [name], M.D.

## 2017-02-01 ENCOUNTER — Telehealth: Payer: Self-pay

## 2017-02-01 NOTE — Telephone Encounter (Signed)
Getting a prior auth for Tanzeum  Do not see this on med list?  Please advise

## 2017-02-03 ENCOUNTER — Encounter: Payer: Self-pay | Admitting: Pediatrics

## 2017-02-03 ENCOUNTER — Ambulatory Visit (INDEPENDENT_AMBULATORY_CARE_PROVIDER_SITE_OTHER): Payer: Medicare Other | Admitting: Pediatrics

## 2017-02-03 VITALS — BP 102/66 | HR 81 | Temp 98.4°F | Ht 72.0 in | Wt 184.0 lb

## 2017-02-03 DIAGNOSIS — H60332 Swimmer's ear, left ear: Secondary | ICD-10-CM | POA: Diagnosis not present

## 2017-02-03 MED ORDER — NEOMYCIN-POLYMYXIN-HC 3.5-10000-1 OT SUSP: 3.0000 [drp] | Freq: Four times a day (QID) | OTIC | 0 refills | Status: DC

## 2017-02-03 NOTE — Progress Notes (Signed)
  Subjective:   Patient ID: Jon [name], male    DOB: [birth year], 71 y.o.   MRN: [id number] CC: Ear Drainage  HPI: Jon [name] L Leaming is a 71 y.o. male presenting for Ear Drainage  About a month of ear drainage for about the last month Minimal pain in ear, worse when moved No URI symptoms No fever Otherwise feeling well  Relevant past medical, surgical, family and social history reviewed. Allergies and medications reviewed and updated. History  Smoking Status  . Former Smoker  . Packs/day: 1.00  . Years: 40.00  . Types: Cigarettes  . Start date: 07/03/1966  . Quit date: 02/06/2009  Smokeless Tobacco  . Never Used   ROS: Per HPI   Objective:    BP 102/66   Pulse 81   Temp 98.4 F (36.9 C) (Oral)   Ht 6' (1.829 m)   Wt 184 lb (83.5 kg)   BMI 24.95 kg/m   Wt Readings from Last 3 Encounters:  02/03/17 184 lb (83.5 kg)  01/29/17 185 lb (83.9 kg)  08/29/16 187 lb (84.8 kg)    Gen: NAD, alert, cooperative with exam, NCAT EYES: EOMI, no conjunctival injection, or no icterus ENT:  L ear canal with fair amount of white-yellow debris, some erythema of canal. R TM nl, L TM obscured. OP without erythema LYMPH: no cervical LAD CV: NRRR, normal S1/S2, no murmur Resp: CTABL, no wheezes, normal WOB Abd: +BS, soft, NTND. no guarding or organomegaly Ext: No edema, warm Neuro: Alert and oriented  Assessment & Plan:  Jon [name] was seen today for ear drainage.  Diagnoses and all orders for this visit:  Swimmer's ear of left side, unspecified chronicity -     neomycin-polymyxin-hydrocortisone (CORTISPORIN) 3.5-10000-1 OTIC suspension; Place 3 drops into the left ear 4 (four) times daily.   Follow up plan: Return if symptoms worsen or fail to improve. Rex Krasarol Vincent, MD [facility]

## 2017-02-05 NOTE — Telephone Encounter (Signed)
Sorry, it is listed under the generic name albiglutide on his meds. Thanks, WS

## 2017-02-06 ENCOUNTER — Telehealth: Payer: Self-pay | Admitting: Family Medicine

## 2017-02-07 NOTE — Telephone Encounter (Signed)
Patient has not been able to get Albiglutide .  Mail order says it needs a prior authorization.  Please review and advise.

## 2017-02-07 NOTE — Telephone Encounter (Signed)
Can you please check on

## 2017-02-07 NOTE — Telephone Encounter (Signed)
Albiglutide will not be available . Patient is willing to take another medication that was listed as approved.  Please send new medication to pharmacy.

## 2017-02-12 ENCOUNTER — Telehealth: Payer: Self-pay | Admitting: Family Medicine

## 2017-02-12 NOTE — Telephone Encounter (Signed)
Please let me know if his insurance prefers other medications similar to the Tanzeum. If not, prior authorization may be necessary.

## 2017-02-12 NOTE — Telephone Encounter (Signed)
I received this information, there was supposed to be a fax that went along with it with the preferred meds that I have not seen, have you seen the fax?

## 2017-02-12 NOTE — Telephone Encounter (Signed)
Wanting to know the status of prior auth.

## 2017-02-13 NOTE — Telephone Encounter (Signed)
Working on it today

## 2017-02-14 ENCOUNTER — Other Ambulatory Visit: Payer: Self-pay | Admitting: Family Medicine

## 2017-02-14 MED ORDER — DULAGLUTIDE 1.5 MG/0.5ML ~~LOC~~ SOAJ: 1.5000 mg | SUBCUTANEOUS | 12 refills | Status: DC

## 2017-02-14 MED ORDER — DULAGLUTIDE 1.5 MG/0.5ML ~~LOC~~ SOAJ: 1.5000 mg | SUBCUTANEOUS | 3 refills | Status: DC

## 2017-02-16 ENCOUNTER — Ambulatory Visit (INDEPENDENT_AMBULATORY_CARE_PROVIDER_SITE_OTHER): Payer: Medicare Other | Admitting: Family Medicine

## 2017-02-16 ENCOUNTER — Encounter: Payer: Self-pay | Admitting: Family Medicine

## 2017-02-16 ENCOUNTER — Ambulatory Visit (INDEPENDENT_AMBULATORY_CARE_PROVIDER_SITE_OTHER): Payer: Medicare Other

## 2017-02-16 VITALS — BP 103/66 | HR 78 | Temp 97.8°F | Ht 72.0 in | Wt 185.0 lb

## 2017-02-16 DIAGNOSIS — M5441 Lumbago with sciatica, right side: Secondary | ICD-10-CM

## 2017-02-16 DIAGNOSIS — R29898 Other symptoms and signs involving the musculoskeletal system: Secondary | ICD-10-CM | POA: Diagnosis not present

## 2017-02-16 MED ORDER — CYCLOBENZAPRINE HCL 5 MG PO TABS: 10.0000 mg | ORAL_TABLET | Freq: Three times a day (TID) | ORAL | 0 refills | Status: DC | PRN

## 2017-02-16 NOTE — Progress Notes (Signed)
Chief Complaint  Patient presents with  . Back Pain    pt here today c/o lower back pain after missing the chair and sliding off the arm of the chair and hitting his back on the table. His right leg is getting worse, sometimes he can't pick it up to walk.    HPI  Patient presents today for Refill of against a chair several days ago. He fell against the chair and slid off of it hitting his back on the table. Since then he's having midline low back pain somewhat to the right and his right leg is painful and weak.  PMH: Smoking status noted ROS: Per HPI  Objective: BP 103/66   Pulse 78   Temp 97.8 F (36.6 C) (Oral)   Ht 6' (1.829 m)   Wt 185 lb (83.9 kg)   BMI 25.09 kg/m  Gen: NAD, alert, cooperative with exam HEENT: NCAT, EOMI, PERRL CV: RRR, good S1/S2, no murmur Resp: CTABL, no wheezes, non-labored Abd: SNTND, BS present, no guarding or organomegaly Ext: No edema, warm Neuro: Alert and oriented, No gross deficits  Assessment and plan:  1. Acute right-sided low back pain with right-sided sciatica   2. Right leg weakness     Meds ordered this encounter  Medications  . rivastigmine (EXELON) 3 MG capsule    Sig: Take 3 mg by mouth 2 (two) times daily.  . cyclobenzaprine (FLEXERIL) 5 MG tablet    Sig: Take 2 tablets (10 mg total) by mouth 3 (three) times daily as needed (muscle pain).    Dispense:  270 tablet    Refill:  0    Orders Placed This Encounter  Procedures  . DG Lumbar Spine 2-3 Views    Standing Status:   Future    Number of Occurrences:   1    Standing Expiration Date:   04/18/2018    Order Specific Question:   Reason for Exam (SYMPTOM  OR DIAGNOSIS REQUIRED)    Answer:   fell, hit back on table    Order Specific Question:   Preferred imaging location?    Answer:   Internal  . Ambulatory referral to Orthopedics    Referral Priority:   Routine    Referral Type:   Consultation    Number of Visits Requested:   1    Follow upIn 2 months and as  needed.  Mechele Claude, MD

## 2017-02-23 ENCOUNTER — Ambulatory Visit (INDEPENDENT_AMBULATORY_CARE_PROVIDER_SITE_OTHER): Payer: Medicare Other | Admitting: Family Medicine

## 2017-02-23 ENCOUNTER — Encounter: Payer: Self-pay | Admitting: Family Medicine

## 2017-02-23 VITALS — BP 92/62 | HR 102 | Temp 98.6°F | Ht 72.0 in

## 2017-02-23 DIAGNOSIS — M4690 Unspecified inflammatory spondylopathy, site unspecified: Secondary | ICD-10-CM

## 2017-02-23 DIAGNOSIS — E1165 Type 2 diabetes mellitus with hyperglycemia: Secondary | ICD-10-CM

## 2017-02-23 DIAGNOSIS — Z794 Long term (current) use of insulin: Secondary | ICD-10-CM | POA: Diagnosis not present

## 2017-02-23 DIAGNOSIS — E118 Type 2 diabetes mellitus with unspecified complications: Secondary | ICD-10-CM | POA: Diagnosis not present

## 2017-02-23 DIAGNOSIS — I10 Essential (primary) hypertension: Secondary | ICD-10-CM

## 2017-02-23 DIAGNOSIS — M47819 Spondylosis without myelopathy or radiculopathy, site unspecified: Secondary | ICD-10-CM

## 2017-02-23 DIAGNOSIS — IMO0002 Reserved for concepts with insufficient information to code with codable children: Secondary | ICD-10-CM

## 2017-02-23 NOTE — Progress Notes (Signed)
Subjective:  Patient ID: Jon [name], male    DOB: September 12, 1945  Age: 71 y.o. MRN: [id number]  CC: face to face (pt here today for a face to face to get PT/OT home health because he can't walk hardly and is in pain)   HPI Jon [name] presents for  Back pain radiating down the right buttocks and posterior thigh.  He had a wedge compession repair 1 year ago. He is becoming more demented with memory loss and poor cognition per wife.  Depression screen [facility] 2/9 02/23/2017 02/03/2017 01/29/2017  Decreased Interest 0 0 0  Down, Depressed, Hopeless 0 0 0  PHQ - 2 Score 0 0 0    History Jon [name] has a past medical history of AAA (abdominal aortic aneurysm) (HCC); Anemia; Ataxia; Diabetes mellitus without complication (HCC); History of gallstones; Hyperlipidemia; Hypertension; and Vitamin D deficiency.   He has a past surgical history that includes ERCP; Inguinal hernia repair; and Tonsillectomy.   His family history includes Diabetes in his brother and mother; Hip fracture in his mother; Hyperlipidemia in his brother; Hypertension in his brother; Lupus in his sister; Pulmonary embolism in his mother; Scleroderma in his sister.He reports that he quit smoking about 8 years ago. His smoking use included Cigarettes. He started smoking about 50 years ago. He has a 40.00 pack-year smoking history. He has never used smokeless tobacco. He reports that he does not drink alcohol or use drugs.    ROS Review of Systems  Constitutional: Negative for chills, diaphoresis and fever.  HENT: Negative for rhinorrhea and sore throat.   Respiratory: Negative for cough and shortness of breath.   Cardiovascular: Negative for chest pain.  Gastrointestinal: Negative for abdominal pain.  Musculoskeletal: Positive for back pain, gait problem and myalgias.  Skin: Negative for rash.  Neurological: Negative for weakness and headaches.    Objective:  BP 92/62   Pulse (!) 102   Temp 98.6 F (37 C) (Oral)   Ht 6' (1.829  m)   BP Readings from Last 3 Encounters:  02/23/17 92/62  02/16/17 103/66  02/03/17 102/66    Wt Readings from Last 3 Encounters:  02/16/17 185 lb (83.9 kg)  02/03/17 184 lb (83.5 kg)  01/29/17 185 lb (83.9 kg)     Physical Exam  Constitutional: He appears well-developed and well-nourished.  HENT:  Head: Normocephalic and atraumatic.  Right Ear: Tympanic membrane and external ear normal. No decreased hearing is noted.  Left Ear: Tympanic membrane and external ear normal. No decreased hearing is noted.  Mouth/Throat: No oropharyngeal exudate or posterior oropharyngeal erythema.  Eyes: Pupils are equal, round, and reactive to light.  Neck: Normal range of motion. Neck supple.  Cardiovascular: Normal rate and regular rhythm.   No murmur heard. Pulmonary/Chest: Breath sounds normal. No respiratory distress.  Abdominal: Soft. Bowel sounds are normal. He exhibits no mass. There is no tenderness.  Musculoskeletal: He exhibits tenderness (right low back, sciatic notch. Also has slow, antalgic gait. Using walker).  Psychiatric: He has a normal mood and affect. His speech is delayed. He is slowed. Thought content is delusional. Cognition and memory are impaired. He expresses impulsivity.  Vitals reviewed.     Assessment & Plan:   Jon [name] was seen today for face to face.  Diagnoses and all orders for this visit:  Spinal arthritis [facility]) -     Face-to-face encounter (required for Medicare/Medicaid patients)  Uncontrolled type 2 diabetes mellitus with complication, with long-term current use of insulin (HCC)  Essential hypertension    I am having [name] maintain his aspirin, Insulin Glargine, metFORMIN, lisinopril, traMADol, glucose blood, neomycin-polymyxin-hydrocortisone, omeprazole, Dulaglutide, rivastigmine, and cyclobenzaprine.  Allergies as of 02/23/2017      Reactions   Donepezil Nausea And Vomiting      Medication List       Accurate as of 02/23/17 11:59 PM.  Always use your most recent med list.          aspirin 81 MG tablet Take 81 mg by mouth daily.   cyclobenzaprine 5 MG tablet Commonly known as:  FLEXERIL Take 2 tablets (10 mg total) by mouth 3 (three) times daily as needed (muscle pain).   Dulaglutide 1.5 MG/0.5ML Sopn Commonly known as:  TRULICITY Inject 1.5 mg into the skin once a week.   glucose blood test strip Check fasting and 2 hours after biggest meal   Insulin Glargine 100 UNIT/ML Solostar Pen Commonly known as:  LANTUS SOLOSTAR pt doing 45 units daily   lisinopril 20 MG tablet Commonly known as:  PRINIVIL,ZESTRIL Take 1 tablet (20 mg total) by mouth daily.   metFORMIN 1000 MG tablet Commonly known as:  GLUCOPHAGE Take 1 tablet (1,000 mg total) by mouth daily with breakfast.   neomycin-polymyxin-hydrocortisone 3.5-10000-1 OTIC suspension Commonly known as:  CORTISPORIN Place 3 drops into the left ear 4 (four) times daily.   omeprazole 20 MG capsule Commonly known as:  PRILOSEC TAKE 1 CAPSULE DAILY   rivastigmine 3 MG capsule Commonly known as:  EXELON Take 3 mg by mouth 2 (two) times daily.   traMADol 50 MG tablet Commonly known as:  ULTRAM Take 1 tablet (50 mg total) by mouth 4 (four) times daily as needed for moderate pain.            Discharge Care Instructions        Start     Ordered   02/23/17 0000  Face-to-face encounter (required for Medicare/Medicaid patients)    Comments:  Patient has severe arthritis of the spine with recurrent compression fractures and has had multiple falls. The most recent fall has impacted his spinal arthritis and caused severe pain as well as inability to ambulate.  Question Answer Comment  The encounter with the patient was in whole, or in part, for the following medical condition, which is the primary reason for home health care weakness affecting ambulation   I certify that, based on my findings, the following services are medically necessary home health  services Physical therapy   Reason for Medically Necessary Home Health Services Therapy- Therapeutic Exercises to Increase Strength and Endurance   My clinical findings support the need for the above services Unable to leave home safely without assistance and/or assistive device   Further, I certify that my clinical findings support that this patient is homebound due to: Pain interferes with ambulation/mobility      02/23/17 1530       Follow-up: Return in about 1 month (around 03/26/2017) for diabetes.  Mechele Claude, M.D.

## 2017-03-01 ENCOUNTER — Ambulatory Visit (INDEPENDENT_AMBULATORY_CARE_PROVIDER_SITE_OTHER): Payer: Medicare Other | Admitting: Orthopaedic Surgery

## 2017-03-01 DIAGNOSIS — D649 Anemia, unspecified: Secondary | ICD-10-CM | POA: Diagnosis not present

## 2017-03-01 DIAGNOSIS — Z794 Long term (current) use of insulin: Secondary | ICD-10-CM | POA: Diagnosis not present

## 2017-03-01 DIAGNOSIS — I10 Essential (primary) hypertension: Secondary | ICD-10-CM | POA: Diagnosis not present

## 2017-03-01 DIAGNOSIS — R296 Repeated falls: Secondary | ICD-10-CM | POA: Diagnosis not present

## 2017-03-01 DIAGNOSIS — E785 Hyperlipidemia, unspecified: Secondary | ICD-10-CM | POA: Diagnosis not present

## 2017-03-01 DIAGNOSIS — R270 Ataxia, unspecified: Secondary | ICD-10-CM | POA: Diagnosis not present

## 2017-03-01 DIAGNOSIS — M4800 Spinal stenosis, site unspecified: Secondary | ICD-10-CM | POA: Diagnosis not present

## 2017-03-01 DIAGNOSIS — Z7982 Long term (current) use of aspirin: Secondary | ICD-10-CM | POA: Diagnosis not present

## 2017-03-01 DIAGNOSIS — Z87311 Personal history of (healed) other pathological fracture: Secondary | ICD-10-CM | POA: Diagnosis not present

## 2017-03-01 DIAGNOSIS — E119 Type 2 diabetes mellitus without complications: Secondary | ICD-10-CM | POA: Diagnosis not present

## 2017-03-05 DIAGNOSIS — E119 Type 2 diabetes mellitus without complications: Secondary | ICD-10-CM | POA: Diagnosis not present

## 2017-03-05 DIAGNOSIS — I10 Essential (primary) hypertension: Secondary | ICD-10-CM | POA: Diagnosis not present

## 2017-03-05 DIAGNOSIS — M4800 Spinal stenosis, site unspecified: Secondary | ICD-10-CM | POA: Diagnosis not present

## 2017-03-05 DIAGNOSIS — D649 Anemia, unspecified: Secondary | ICD-10-CM | POA: Diagnosis not present

## 2017-03-05 DIAGNOSIS — R296 Repeated falls: Secondary | ICD-10-CM | POA: Diagnosis not present

## 2017-03-05 DIAGNOSIS — R270 Ataxia, unspecified: Secondary | ICD-10-CM | POA: Diagnosis not present

## 2017-03-07 DIAGNOSIS — R296 Repeated falls: Secondary | ICD-10-CM | POA: Diagnosis not present

## 2017-03-07 DIAGNOSIS — E119 Type 2 diabetes mellitus without complications: Secondary | ICD-10-CM | POA: Diagnosis not present

## 2017-03-07 DIAGNOSIS — I10 Essential (primary) hypertension: Secondary | ICD-10-CM | POA: Diagnosis not present

## 2017-03-07 DIAGNOSIS — M4800 Spinal stenosis, site unspecified: Secondary | ICD-10-CM | POA: Diagnosis not present

## 2017-03-07 DIAGNOSIS — R270 Ataxia, unspecified: Secondary | ICD-10-CM | POA: Diagnosis not present

## 2017-03-07 DIAGNOSIS — D649 Anemia, unspecified: Secondary | ICD-10-CM | POA: Diagnosis not present

## 2017-03-08 ENCOUNTER — Ambulatory Visit (INDEPENDENT_AMBULATORY_CARE_PROVIDER_SITE_OTHER): Payer: Medicare Other | Admitting: Family Medicine

## 2017-03-08 ENCOUNTER — Encounter: Payer: Self-pay | Admitting: Family Medicine

## 2017-03-08 VITALS — BP 95/61 | HR 81 | Temp 98.0°F | Ht 72.0 in | Wt 179.0 lb

## 2017-03-08 DIAGNOSIS — F09 Unspecified mental disorder due to known physiological condition: Secondary | ICD-10-CM

## 2017-03-08 DIAGNOSIS — F329 Major depressive disorder, single episode, unspecified: Secondary | ICD-10-CM

## 2017-03-08 DIAGNOSIS — I10 Essential (primary) hypertension: Secondary | ICD-10-CM | POA: Diagnosis not present

## 2017-03-08 MED ORDER — SERTRALINE HCL 25 MG PO TABS: 25.0000 mg | ORAL_TABLET | Freq: Every day | ORAL | 3 refills | Status: DC

## 2017-03-08 NOTE — Progress Notes (Signed)
Subjective:  Patient ID: Jon [name], male    DOB: [birth year]  Age: 71 y.o. MRN: [id number]  CC: Depression (pt here today for follow up on his depression)   HPI Jon Howellsony L Kilpatrick presents for Evaluation for depression. His wife feels he is depressed. His behavior has improved over the last couple of days but over the last several weeks she's noticed that he had increased irritability. She says he snaps at her and yells at her [place] the top of his lungs.Patient has had diagnosis of mild dementia and does not remember these incidents. He says he does feel down and sad at times. Wife confirms that he cries frequently. Although he doesn't concentrate well it's difficult to tell how much of this might be from his dementia.He feels that he has a lot of hope for the future. He hopes to be able to walk better. He hopes that he can quit falling.  [facility] 2[facility] 03/08/2017 02/23/2017 02/03/2017  Decreased Interest 1 0 0  Down, Depressed, Hopeless 1 0 0  PHQ - 2 Score 2 0 0  Altered sleeping 1 - -  Tired, decreased energy 0 - -  Change in appetite 1 - -  Feeling bad or failure about yourself  1 - -  Trouble concentrating 0 - -  Moving slowly or fidgety/restless 0 - -  Suicidal thoughts 0 - -  PHQ-9 Score 5 - -    History [name] has a past medical history of AAA (abdominal aortic aneurysm) (HCC); Anemia; Ataxia; Diabetes mellitus without complication (HCC); History of gallstones; Hyperlipidemia; Hypertension; and Vitamin D deficiency.   He has a past surgical history that includes ERCP; Inguinal hernia repair; and Tonsillectomy.   His family history includes Diabetes in his brother and mother; Hip fracture in his mother; Hyperlipidemia in his brother; Hypertension in his brother; Lupus in his sister; Pulmonary embolism in his mother; Scleroderma in his sister.He reports that he quit smoking about 8 years ago. His smoking use included  Cigarettes. He started smoking about 50 years ago. He has a 40.00 pack-year smoking history. He has never used smokeless tobacco. He reports that he does not drink alcohol or use drugs.    ROS Review of Systems  Constitutional: Negative for chills, diaphoresis and fever.  HENT: Negative for rhinorrhea and sore throat.   Respiratory: Negative for cough and shortness of breath.   Cardiovascular: Negative for chest pain.  Gastrointestinal: Negative for abdominal pain.  Musculoskeletal: Negative for arthralgias and myalgias.  Skin: Negative for rash.  Neurological: Negative for weakness and headaches.    Objective:  BP 95/61   Pulse 81   Temp 98 F (36.7 C) (Oral)   Ht 6' (1.829 m)   Wt 179 lb (81.2 kg)   BMI 24.28 kg/m   BP Readings from Last 3 Encounters:  03/08/17 95/61  02/23/17 92/62  02/16/17 103/66    Wt Readings from Last 3 Encounters:  03/08/17 179 lb (81.2 kg)  02/16/17 185 lb (83.9 kg)  02/03/17 184 lb (83.5 kg)     Physical Exam  Constitutional: He appears well-developed and well-nourished.  HENT:  Head: Normocephalic and atraumatic.  Right Ear: Tympanic membrane and external ear normal. No decreased hearing is noted.  Left Ear: Tympanic membrane and external ear normal. No decreased hearing is noted.  Mouth/Throat: No oropharyngeal exudate or posterior oropharyngeal erythema.  Eyes: Pupils are equal, round, and reactive to  light.  Neck: Normal range of motion. Neck supple.  Cardiovascular: Normal rate and regular rhythm.   No murmur heard. Pulmonary/Chest: Breath sounds normal. No respiratory distress.  Abdominal: Soft. Bowel sounds are normal. He exhibits no mass. There is no tenderness.  Psychiatric: His mood appears anxious. His speech is delayed. He is slowed. Cognition and memory are impaired.  Vitals reviewed.     Assessment & Plan:   Kalyn was seen today for depression.  Diagnoses and all orders for this visit:  Reactive  depression  Essential hypertension  Mild cognitive disorder  Other orders -     sertraline (ZOLOFT) 25 MG tablet; Take 1 tablet (25 mg total) by mouth daily.       I have discontinued Mr. Salguero traMADol. I am also having him start on sertraline. Additionally, I am having him maintain his aspirin, Insulin Glargine, metFORMIN, lisinopril, glucose blood, neomycin-polymyxin-hydrocortisone, omeprazole, Dulaglutide, rivastigmine, cyclobenzaprine, and Acetaminophen (TYLENOL ARTHRITIS EXT RELIEF PO).  Allergies as of 03/08/2017      Reactions   Donepezil Nausea And Vomiting      Medication List       Accurate as of 03/08/17  5:29 PM. Always use your most recent med list.          aspirin 81 MG tablet Take 81 mg by mouth daily.   cyclobenzaprine 5 MG tablet Commonly known as:  FLEXERIL Take 2 tablets (10 mg total) by mouth 3 (three) times daily as needed (muscle pain).   Dulaglutide 1.5 MG/0.5ML Sopn Commonly known as:  TRULICITY Inject 1.5 mg into the skin once a week.   glucose blood test strip Check fasting and 2 hours after biggest meal   Insulin Glargine 100 UNIT/ML Solostar Pen Commonly known as:  LANTUS SOLOSTAR pt doing 45 units daily   lisinopril 20 MG tablet Commonly known as:  PRINIVIL,ZESTRIL Take 1 tablet (20 mg total) by mouth daily.   metFORMIN 1000 MG tablet Commonly known as:  GLUCOPHAGE Take 1 tablet (1,000 mg total) by mouth daily with breakfast.   neomycin-polymyxin-hydrocortisone 3.5-10000-1 OTIC suspension Commonly known as:  CORTISPORIN Place 3 drops into the left ear 4 (four) times daily.   omeprazole 20 MG capsule Commonly known as:  PRILOSEC TAKE 1 CAPSULE DAILY   rivastigmine 3 MG capsule Commonly known as:  EXELON Take 3 mg by mouth 2 (two) times daily.   sertraline 25 MG tablet Commonly known as:  ZOLOFT Take 1 tablet (25 mg total) by mouth daily.   TYLENOL ARTHRITIS EXT RELIEF PO Take by mouth.            Discharge  Care Instructions        Start     Ordered   03/08/17 0000  sertraline (ZOLOFT) 25 MG tablet  Daily     03/08/17 1545       Follow-up: Return in about 6 weeks (around 04/19/2017).  Mechele Claude, M.D.

## 2017-03-09 DIAGNOSIS — R270 Ataxia, unspecified: Secondary | ICD-10-CM | POA: Diagnosis not present

## 2017-03-09 DIAGNOSIS — R296 Repeated falls: Secondary | ICD-10-CM | POA: Diagnosis not present

## 2017-03-09 DIAGNOSIS — M4800 Spinal stenosis, site unspecified: Secondary | ICD-10-CM | POA: Diagnosis not present

## 2017-03-09 DIAGNOSIS — D649 Anemia, unspecified: Secondary | ICD-10-CM | POA: Diagnosis not present

## 2017-03-09 DIAGNOSIS — I10 Essential (primary) hypertension: Secondary | ICD-10-CM | POA: Diagnosis not present

## 2017-03-09 DIAGNOSIS — E119 Type 2 diabetes mellitus without complications: Secondary | ICD-10-CM | POA: Diagnosis not present

## 2017-03-12 DIAGNOSIS — E119 Type 2 diabetes mellitus without complications: Secondary | ICD-10-CM | POA: Diagnosis not present

## 2017-03-12 DIAGNOSIS — R270 Ataxia, unspecified: Secondary | ICD-10-CM | POA: Diagnosis not present

## 2017-03-12 DIAGNOSIS — R296 Repeated falls: Secondary | ICD-10-CM | POA: Diagnosis not present

## 2017-03-12 DIAGNOSIS — M4800 Spinal stenosis, site unspecified: Secondary | ICD-10-CM | POA: Diagnosis not present

## 2017-03-12 DIAGNOSIS — I10 Essential (primary) hypertension: Secondary | ICD-10-CM | POA: Diagnosis not present

## 2017-03-12 DIAGNOSIS — D649 Anemia, unspecified: Secondary | ICD-10-CM | POA: Diagnosis not present

## 2017-03-14 ENCOUNTER — Other Ambulatory Visit: Payer: Self-pay | Admitting: Family Medicine

## 2017-03-14 ENCOUNTER — Telehealth: Payer: Self-pay | Admitting: Family Medicine

## 2017-03-14 DIAGNOSIS — D649 Anemia, unspecified: Secondary | ICD-10-CM | POA: Diagnosis not present

## 2017-03-14 DIAGNOSIS — I10 Essential (primary) hypertension: Secondary | ICD-10-CM | POA: Diagnosis not present

## 2017-03-14 DIAGNOSIS — E119 Type 2 diabetes mellitus without complications: Secondary | ICD-10-CM | POA: Diagnosis not present

## 2017-03-14 DIAGNOSIS — R296 Repeated falls: Secondary | ICD-10-CM | POA: Diagnosis not present

## 2017-03-14 DIAGNOSIS — R270 Ataxia, unspecified: Secondary | ICD-10-CM | POA: Diagnosis not present

## 2017-03-14 DIAGNOSIS — M4800 Spinal stenosis, site unspecified: Secondary | ICD-10-CM | POA: Diagnosis not present

## 2017-03-14 NOTE — Telephone Encounter (Signed)
It protects his kidneys from diabetic damage better at that dose.

## 2017-03-14 NOTE — Telephone Encounter (Signed)
Please advise on increase in lisinopril?

## 2017-03-15 ENCOUNTER — Other Ambulatory Visit: Payer: Self-pay | Admitting: Family Medicine

## 2017-03-15 DIAGNOSIS — M4800 Spinal stenosis, site unspecified: Secondary | ICD-10-CM | POA: Diagnosis not present

## 2017-03-15 DIAGNOSIS — I10 Essential (primary) hypertension: Secondary | ICD-10-CM | POA: Diagnosis not present

## 2017-03-15 DIAGNOSIS — E119 Type 2 diabetes mellitus without complications: Secondary | ICD-10-CM | POA: Diagnosis not present

## 2017-03-15 DIAGNOSIS — D649 Anemia, unspecified: Secondary | ICD-10-CM | POA: Diagnosis not present

## 2017-03-15 DIAGNOSIS — R296 Repeated falls: Secondary | ICD-10-CM | POA: Diagnosis not present

## 2017-03-15 DIAGNOSIS — R270 Ataxia, unspecified: Secondary | ICD-10-CM | POA: Diagnosis not present

## 2017-03-15 MED ORDER — LISINOPRIL 10 MG PO TABS: 10.0000 mg | ORAL_TABLET | Freq: Every day | ORAL | 3 refills | Status: DC

## 2017-03-15 NOTE — Telephone Encounter (Signed)
I sent in the requested prescription 

## 2017-03-15 NOTE — Telephone Encounter (Signed)
Pt's wife Steward DroneBrenda calling asking about the lisinopril dosage, after looking back on the [place] [facility] [place] [facility] and they didn't know the dosage so Dr Darlyn ReadStacks sent in 20mg  but now Steward DroneBrenda is saying they had given him 10mg  and would like lisinopril 10mg  sent in as she is concerned about his BP dropping too low. She is aware you're out of the office until tomorrow.

## 2017-03-16 NOTE — Telephone Encounter (Signed)
Patients wife notified that rx sent to pharmacy 

## 2017-03-19 ENCOUNTER — Telehealth: Payer: Self-pay | Admitting: Family Medicine

## 2017-03-19 ENCOUNTER — Other Ambulatory Visit: Payer: Self-pay | Admitting: Family Medicine

## 2017-03-19 DIAGNOSIS — R270 Ataxia, unspecified: Secondary | ICD-10-CM | POA: Diagnosis not present

## 2017-03-19 DIAGNOSIS — D649 Anemia, unspecified: Secondary | ICD-10-CM | POA: Diagnosis not present

## 2017-03-19 DIAGNOSIS — R296 Repeated falls: Secondary | ICD-10-CM | POA: Diagnosis not present

## 2017-03-19 DIAGNOSIS — M4800 Spinal stenosis, site unspecified: Secondary | ICD-10-CM | POA: Diagnosis not present

## 2017-03-19 DIAGNOSIS — E119 Type 2 diabetes mellitus without complications: Secondary | ICD-10-CM | POA: Diagnosis not present

## 2017-03-19 DIAGNOSIS — I10 Essential (primary) hypertension: Secondary | ICD-10-CM | POA: Diagnosis not present

## 2017-03-19 IMAGING — DX DG CHEST 2V
2 series · 2 of 2 positions shown · non-contrast
Comparison: PA and lateral chest x-ray January 13, 2016

CLINICAL DATA: Follow-up of pneumonia

EXAM:
CHEST  2 VIEW

[chest pa]
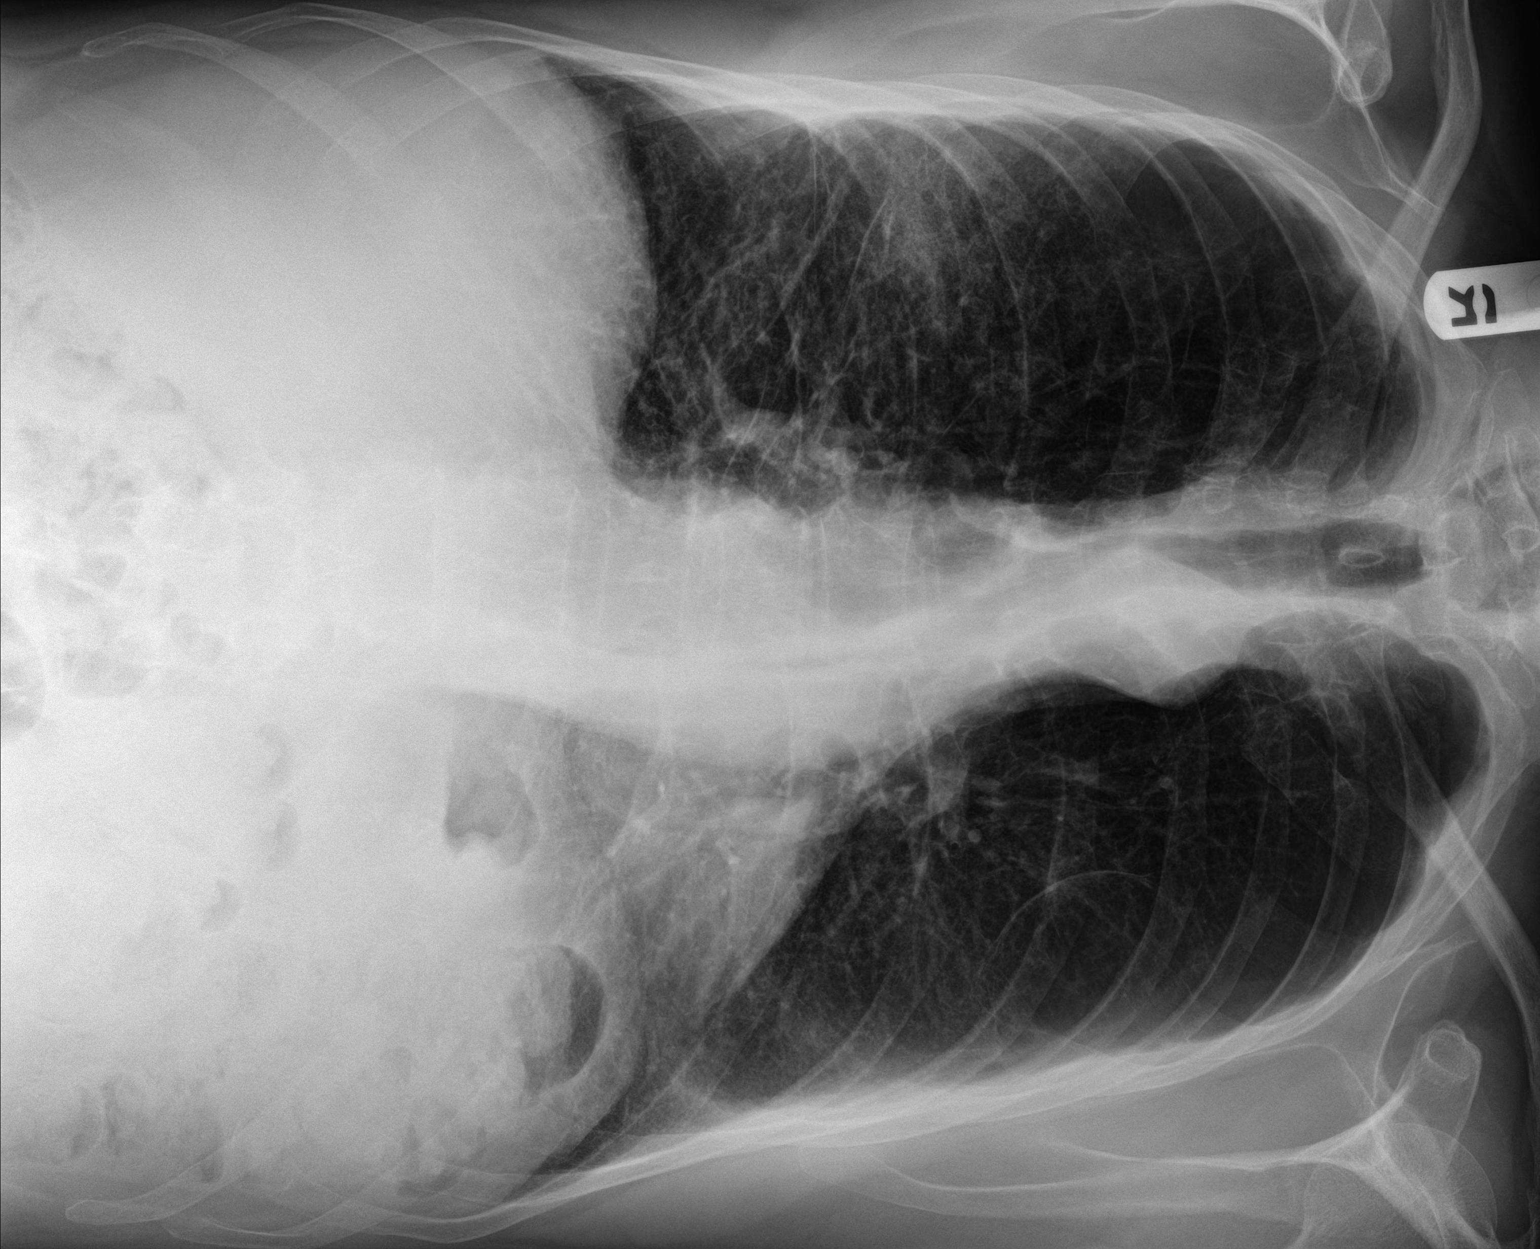

[chest lat]
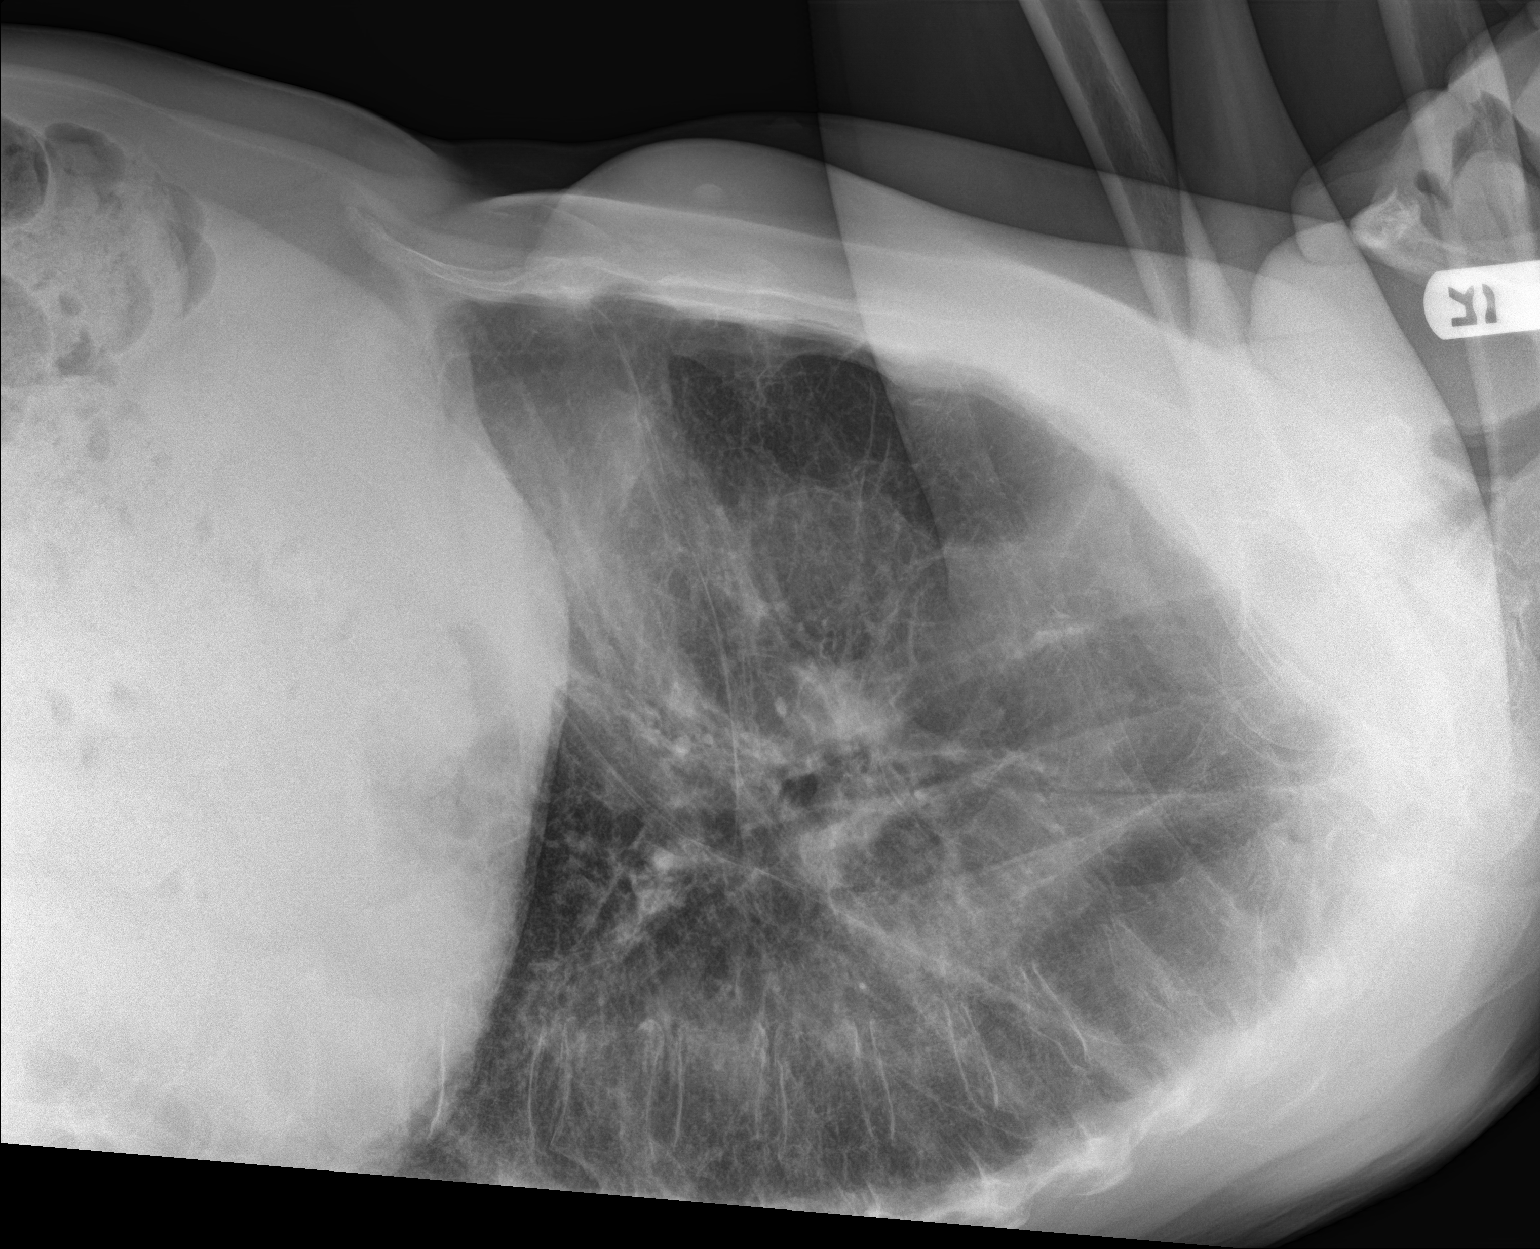

[2 of 2 positions shown; findings below may reference images not displayed]

FINDINGS: The lungs are better inflated today. There remains increased
interstitial and early alveolar opacity peripherally in the right
mid lung. The left lung is clear. A bullous lesion is observed
peripherally in the mid lung. There is no pleural effusion. The
heart and pulmonary vascularity are normal. There is tortuosity of
the descending thoracic aorta.
IMPRESSION: Persistent interstitial and mild alveolar opacity peripherally in
the right mid lung. The findings are more conspicuous than 10 January, 2016 and slightly more conspicuous than on January 13, 2016. There is
underlying COPD.

Chest CT scanning is recommended.

## 2017-03-19 MED ORDER — METFORMIN HCL 1000 MG PO TABS: 1000.0000 mg | ORAL_TABLET | Freq: Two times a day (BID) | ORAL | 3 refills | Status: DC

## 2017-03-19 NOTE — Telephone Encounter (Signed)
Please advise 

## 2017-03-19 NOTE — Telephone Encounter (Signed)
Aware that Dr. Darlyn Read has sent in new prescription

## 2017-03-19 NOTE — Telephone Encounter (Signed)
Please let pt. Know the [facility]

## 2017-03-20 DIAGNOSIS — I83893 Varicose veins of bilateral lower extremities with other complications: Secondary | ICD-10-CM | POA: Diagnosis not present

## 2017-03-20 DIAGNOSIS — I779 Disorder of arteries and arterioles, unspecified: Secondary | ICD-10-CM | POA: Diagnosis not present

## 2017-03-20 DIAGNOSIS — I70203 Unspecified atherosclerosis of native arteries of extremities, bilateral legs: Secondary | ICD-10-CM | POA: Insufficient documentation

## 2017-03-20 DIAGNOSIS — R600 Localized edema: Secondary | ICD-10-CM | POA: Diagnosis not present

## 2017-03-20 DIAGNOSIS — I6523 Occlusion and stenosis of bilateral carotid arteries: Secondary | ICD-10-CM | POA: Diagnosis not present

## 2017-03-20 DIAGNOSIS — Z87891 Personal history of nicotine dependence: Secondary | ICD-10-CM | POA: Diagnosis not present

## 2017-03-20 DIAGNOSIS — I714 Abdominal aortic aneurysm, without rupture: Secondary | ICD-10-CM | POA: Diagnosis not present

## 2017-03-21 DIAGNOSIS — E119 Type 2 diabetes mellitus without complications: Secondary | ICD-10-CM | POA: Diagnosis not present

## 2017-03-21 DIAGNOSIS — R270 Ataxia, unspecified: Secondary | ICD-10-CM | POA: Diagnosis not present

## 2017-03-21 DIAGNOSIS — I10 Essential (primary) hypertension: Secondary | ICD-10-CM | POA: Diagnosis not present

## 2017-03-21 DIAGNOSIS — M4800 Spinal stenosis, site unspecified: Secondary | ICD-10-CM | POA: Diagnosis not present

## 2017-03-21 DIAGNOSIS — R296 Repeated falls: Secondary | ICD-10-CM | POA: Diagnosis not present

## 2017-03-21 DIAGNOSIS — D649 Anemia, unspecified: Secondary | ICD-10-CM | POA: Diagnosis not present

## 2017-03-26 ENCOUNTER — Ambulatory Visit (INDEPENDENT_AMBULATORY_CARE_PROVIDER_SITE_OTHER): Payer: Medicare Other | Admitting: Family Medicine

## 2017-03-26 ENCOUNTER — Encounter: Payer: Self-pay | Admitting: Family Medicine

## 2017-03-26 VITALS — BP 106/61 | HR 70 | Temp 97.2°F | Ht 72.0 in | Wt 182.0 lb

## 2017-03-26 DIAGNOSIS — Z794 Long term (current) use of insulin: Secondary | ICD-10-CM

## 2017-03-26 DIAGNOSIS — E118 Type 2 diabetes mellitus with unspecified complications: Secondary | ICD-10-CM

## 2017-03-26 DIAGNOSIS — F09 Unspecified mental disorder due to known physiological condition: Secondary | ICD-10-CM

## 2017-03-26 DIAGNOSIS — E1165 Type 2 diabetes mellitus with hyperglycemia: Secondary | ICD-10-CM

## 2017-03-26 DIAGNOSIS — R278 Other lack of coordination: Secondary | ICD-10-CM | POA: Diagnosis not present

## 2017-03-26 DIAGNOSIS — R270 Ataxia, unspecified: Secondary | ICD-10-CM | POA: Diagnosis not present

## 2017-03-26 DIAGNOSIS — R296 Repeated falls: Secondary | ICD-10-CM | POA: Diagnosis not present

## 2017-03-26 DIAGNOSIS — E119 Type 2 diabetes mellitus without complications: Secondary | ICD-10-CM | POA: Diagnosis not present

## 2017-03-26 DIAGNOSIS — E1129 Type 2 diabetes mellitus with other diabetic kidney complication: Secondary | ICD-10-CM | POA: Diagnosis not present

## 2017-03-26 DIAGNOSIS — R809 Proteinuria, unspecified: Secondary | ICD-10-CM

## 2017-03-26 DIAGNOSIS — I10 Essential (primary) hypertension: Secondary | ICD-10-CM

## 2017-03-26 DIAGNOSIS — IMO0002 Reserved for concepts with insufficient information to code with codable children: Secondary | ICD-10-CM

## 2017-03-26 DIAGNOSIS — D649 Anemia, unspecified: Secondary | ICD-10-CM | POA: Diagnosis not present

## 2017-03-26 DIAGNOSIS — M4800 Spinal stenosis, site unspecified: Secondary | ICD-10-CM | POA: Diagnosis not present

## 2017-03-26 NOTE — Progress Notes (Signed)
Subjective:  Patient ID: Jon [name], male    DOB: 15-Jul-1945  Age: 71 y.o. MRN: [id number]  CC: Follow-up (pt here today following up for depression and he would not take zoloft)   HPI Jon [name] presents for Recheck of his depression. He declined the Zoloft. His wife said he got better after those few incidents and she has not had any complaints since he was here. There is continued concern however for his dementia. He is unstable on his feet. He is lacking in coordination and is prone to falling. Physical therapy has stated they do not think he can safely stay alone based on their evaluation of his ambulation. He continues to take the [employer]. He is under the care neurology for this. He tends to be forgetful and not comprehend some concepts. His wife would like for there to be someone come in who can stay with him through the day. She needs to get a job. She can no longer stay with him at all times.His blood sugar has been doing quite well since transitioning to Trulicity. Fasting runs around 112 although it got up to 150 911 time area his postprandial runs in the 120s usually.  Depression screen [facility] 2/9 03/26/2017 03/08/2017 02/23/2017  Decreased Interest 1 1 0  Down, Depressed, Hopeless - 1 0  PHQ - 2 Score 1 2 0  Altered sleeping 1 1 -  Tired, decreased energy 0 0 -  Change in appetite 1 1 -  Feeling bad or failure about yourself  1 1 -  Trouble concentrating 0 0 -  Moving slowly or fidgety/restless 0 0 -  Suicidal thoughts 0 0 -  PHQ-9 Score 4 5 -    History Jon [name] has a past medical history of AAA (abdominal aortic aneurysm) (HCC); Anemia; Ataxia; Diabetes mellitus without complication (HCC); History of gallstones; Hyperlipidemia; Hypertension; and Vitamin D deficiency.   He has a past surgical history that includes ERCP; Inguinal hernia repair; and Tonsillectomy.   His family history includes Diabetes in his brother and mother; Hip fracture in his mother; Hyperlipidemia in his  brother; Hypertension in his brother; Lupus in his sister; Pulmonary embolism in his mother; Scleroderma in his sister.He reports that he quit smoking about 8 years ago. His smoking use included Cigarettes. He started smoking about 50 years ago. He has a 40.00 pack-year smoking history. He has never used smokeless tobacco. He reports that he does not drink alcohol or use drugs.    ROS Review of Systems  Constitutional: Negative for chills, diaphoresis and fever.  HENT: Negative for congestion, rhinorrhea and sneezing.   Respiratory: Negative for cough and shortness of breath.   Cardiovascular: Negative for chest pain.  Gastrointestinal: Negative for abdominal pain.  Musculoskeletal: Positive for arthralgias, back pain and gait problem. Negative for myalgias.  Skin: Negative for rash.  Neurological: Negative for weakness and headaches.  Psychiatric/Behavioral: Positive for confusion and decreased concentration.    Objective:  BP 106/61   Pulse 70   Temp (!) 97.2 F (36.2 C) (Oral)   Ht 6' (1.829 m)   Wt 182 lb (82.6 kg)   BMI 24.68 kg/m   BP Readings from Last 3 Encounters:  03/26/17 106/61  03/08/17 95/61  02/23/17 92/62    Wt Readings from Last 3 Encounters:  03/26/17 182 lb (82.6 kg)  03/08/17 179 lb (81.2 kg)  02/16/17 185 lb (83.9 kg)     Physical Exam  Constitutional: He appears well-developed and well-nourished.  HENT:  Head: Normocephalic and atraumatic.  Right Ear: Tympanic membrane and external ear normal. No decreased hearing is noted.  Left Ear: Tympanic membrane and external ear normal. No decreased hearing is noted.  Mouth/Throat: No oropharyngeal exudate or posterior oropharyngeal erythema.  Eyes: Pupils are equal, round, and reactive to light.  Neck: Normal range of motion. Neck supple.  Cardiovascular: Normal rate and regular rhythm.   No murmur heard. Pulmonary/Chest: Breath sounds normal. No respiratory distress.  Abdominal: Soft. Bowel sounds are  normal. He exhibits no mass. There is no tenderness.  Musculoskeletal:  His gait is unsteady. He walks slowly using a walker.  Neurological: A cranial nerve deficit is present. He exhibits abnormal muscle tone. Coordination abnormal.  Skin: Skin is warm and dry.  Psychiatric: He has a normal mood and affect. He is slowed. Cognition and memory are impaired.  Vitals reviewed.     Assessment & Plan:   Jon [name] was seen today for follow-up.  Diagnoses and all orders for this visit:  Microalbuminuria due to type 2 diabetes mellitus (HCC) -     Microalbumin / creatinine urine ratio  Muscular incoordination  Essential hypertension  Uncontrolled type 2 diabetes mellitus with complication, with long-term current use of insulin (HCC)  Mild cognitive disorder    Home health consult to be placed in order to have a aide for assistance with ambulation and supervision due to cognitive impairment. Otherwise he is at very high risk of falling. His judgment skills are poor and could lead to personal danger.   I have discontinued Jon [name] cyclobenzaprine and sertraline. I am also having him maintain his aspirin, Insulin Glargine, glucose blood, neomycin-polymyxin-hydrocortisone, omeprazole, Dulaglutide, rivastigmine, Acetaminophen (TYLENOL ARTHRITIS EXT RELIEF PO), lisinopril, and metFORMIN.  Allergies as of 03/26/2017      Reactions   Donepezil Nausea And Vomiting      Medication List       Accurate as of 03/26/17  5:37 PM. Always use your most recent med list.          aspirin 81 MG tablet Take 81 mg by mouth daily.   Dulaglutide 1.5 MG/0.5ML Sopn Commonly known as:  TRULICITY Inject 1.5 mg into the skin once a week.   glucose blood test strip Check fasting and 2 hours after biggest meal   Insulin Glargine 100 UNIT/ML Solostar Pen Commonly known as:  LANTUS SOLOSTAR pt doing 45 units daily   lisinopril 10 MG tablet Commonly known as:  PRINIVIL,ZESTRIL Take 1 tablet (10 mg  total) by mouth daily.   metFORMIN 1000 MG tablet Commonly known as:  GLUCOPHAGE Take 1 tablet (1,000 mg total) by mouth 2 (two) times daily with a meal.   neomycin-polymyxin-hydrocortisone 3.5-10000-1 OTIC suspension Commonly known as:  CORTISPORIN Place 3 drops into the left ear 4 (four) times daily.   omeprazole 20 MG capsule Commonly known as:  PRILOSEC TAKE 1 CAPSULE DAILY   rivastigmine 3 MG capsule Commonly known as:  EXELON Take 3 mg by mouth 2 (two) times daily.   TYLENOL ARTHRITIS EXT RELIEF PO Take by mouth.            Discharge Care Instructions        Start     Ordered   03/26/17 0000  Microalbumin / creatinine urine ratio     03/26/17 1652       Follow-up: Return in about 2 months (around 05/26/2017).  Mechele Claude, M.D.

## 2017-03-27 ENCOUNTER — Telehealth: Payer: Self-pay | Admitting: Family Medicine

## 2017-03-27 MED ORDER — INSULIN PEN NEEDLE 32G X 6 MM MISC: 3 refills | Status: DC

## 2017-03-27 NOTE — Telephone Encounter (Signed)
Pt aware rx sent over for pen needles.

## 2017-03-28 DIAGNOSIS — M4800 Spinal stenosis, site unspecified: Secondary | ICD-10-CM | POA: Diagnosis not present

## 2017-03-28 DIAGNOSIS — I10 Essential (primary) hypertension: Secondary | ICD-10-CM | POA: Diagnosis not present

## 2017-03-28 DIAGNOSIS — E119 Type 2 diabetes mellitus without complications: Secondary | ICD-10-CM | POA: Diagnosis not present

## 2017-03-28 DIAGNOSIS — D649 Anemia, unspecified: Secondary | ICD-10-CM | POA: Diagnosis not present

## 2017-03-28 DIAGNOSIS — R296 Repeated falls: Secondary | ICD-10-CM | POA: Diagnosis not present

## 2017-03-28 DIAGNOSIS — R270 Ataxia, unspecified: Secondary | ICD-10-CM | POA: Diagnosis not present

## 2017-03-30 DIAGNOSIS — E119 Type 2 diabetes mellitus without complications: Secondary | ICD-10-CM | POA: Diagnosis not present

## 2017-03-30 DIAGNOSIS — D649 Anemia, unspecified: Secondary | ICD-10-CM | POA: Diagnosis not present

## 2017-03-30 DIAGNOSIS — R296 Repeated falls: Secondary | ICD-10-CM | POA: Diagnosis not present

## 2017-03-30 DIAGNOSIS — I10 Essential (primary) hypertension: Secondary | ICD-10-CM | POA: Diagnosis not present

## 2017-03-30 DIAGNOSIS — M4800 Spinal stenosis, site unspecified: Secondary | ICD-10-CM | POA: Diagnosis not present

## 2017-03-30 DIAGNOSIS — R270 Ataxia, unspecified: Secondary | ICD-10-CM | POA: Diagnosis not present

## 2017-04-03 DIAGNOSIS — M4800 Spinal stenosis, site unspecified: Secondary | ICD-10-CM | POA: Diagnosis not present

## 2017-04-03 DIAGNOSIS — E119 Type 2 diabetes mellitus without complications: Secondary | ICD-10-CM | POA: Diagnosis not present

## 2017-04-03 DIAGNOSIS — I10 Essential (primary) hypertension: Secondary | ICD-10-CM | POA: Diagnosis not present

## 2017-04-03 DIAGNOSIS — D649 Anemia, unspecified: Secondary | ICD-10-CM | POA: Diagnosis not present

## 2017-04-03 DIAGNOSIS — R296 Repeated falls: Secondary | ICD-10-CM | POA: Diagnosis not present

## 2017-04-03 DIAGNOSIS — R270 Ataxia, unspecified: Secondary | ICD-10-CM | POA: Diagnosis not present

## 2017-04-05 ENCOUNTER — Telehealth: Payer: Self-pay | Admitting: Family Medicine

## 2017-04-05 DIAGNOSIS — E119 Type 2 diabetes mellitus without complications: Secondary | ICD-10-CM | POA: Diagnosis not present

## 2017-04-05 DIAGNOSIS — R296 Repeated falls: Secondary | ICD-10-CM | POA: Diagnosis not present

## 2017-04-05 DIAGNOSIS — M4800 Spinal stenosis, site unspecified: Secondary | ICD-10-CM | POA: Diagnosis not present

## 2017-04-05 DIAGNOSIS — I10 Essential (primary) hypertension: Secondary | ICD-10-CM | POA: Diagnosis not present

## 2017-04-05 DIAGNOSIS — D649 Anemia, unspecified: Secondary | ICD-10-CM | POA: Diagnosis not present

## 2017-04-05 DIAGNOSIS — R270 Ataxia, unspecified: Secondary | ICD-10-CM | POA: Diagnosis not present

## 2017-04-05 DIAGNOSIS — F09 Unspecified mental disorder due to known physiological condition: Secondary | ICD-10-CM

## 2017-04-05 NOTE — Telephone Encounter (Signed)
What type of referral do you need? 2nd opinion to nuerologist  Have you been seen at our office for this problem? YES (If no, schedule them an appointment.  They will need to be seen before a referral can be done.)  Is there a particular doctor or location that you prefer? [facility] in [place] Dr. Loleta Chance   Patient notified that referrals can take up to a week or longer to process. If they haven't heard anything within a week they should call back and speak with the referral department.

## 2017-04-05 NOTE — Telephone Encounter (Signed)
Sounds like a good idea. Please refer. Thanks, WS

## 2017-04-05 NOTE — Telephone Encounter (Signed)
Please advise 

## 2017-04-05 NOTE — Telephone Encounter (Signed)
Referral placed and pt's wife is aware. 

## 2017-04-10 DIAGNOSIS — M4800 Spinal stenosis, site unspecified: Secondary | ICD-10-CM | POA: Diagnosis not present

## 2017-04-10 DIAGNOSIS — R296 Repeated falls: Secondary | ICD-10-CM | POA: Diagnosis not present

## 2017-04-10 DIAGNOSIS — D649 Anemia, unspecified: Secondary | ICD-10-CM | POA: Diagnosis not present

## 2017-04-10 DIAGNOSIS — I10 Essential (primary) hypertension: Secondary | ICD-10-CM | POA: Diagnosis not present

## 2017-04-10 DIAGNOSIS — R270 Ataxia, unspecified: Secondary | ICD-10-CM | POA: Diagnosis not present

## 2017-04-10 DIAGNOSIS — E119 Type 2 diabetes mellitus without complications: Secondary | ICD-10-CM | POA: Diagnosis not present

## 2017-04-11 ENCOUNTER — Telehealth: Payer: Self-pay | Admitting: Family Medicine

## 2017-04-11 DIAGNOSIS — E119 Type 2 diabetes mellitus without complications: Secondary | ICD-10-CM | POA: Diagnosis not present

## 2017-04-11 DIAGNOSIS — M4800 Spinal stenosis, site unspecified: Secondary | ICD-10-CM | POA: Diagnosis not present

## 2017-04-11 DIAGNOSIS — R270 Ataxia, unspecified: Secondary | ICD-10-CM | POA: Diagnosis not present

## 2017-04-11 DIAGNOSIS — D649 Anemia, unspecified: Secondary | ICD-10-CM | POA: Diagnosis not present

## 2017-04-11 DIAGNOSIS — R296 Repeated falls: Secondary | ICD-10-CM | POA: Diagnosis not present

## 2017-04-11 DIAGNOSIS — I10 Essential (primary) hypertension: Secondary | ICD-10-CM | POA: Diagnosis not present

## 2017-04-11 NOTE — Telephone Encounter (Signed)
Patient would like an order for a motorized wheelchair and a lift.

## 2017-04-11 NOTE — Telephone Encounter (Signed)
They need to contact the vendor to get paperwork started. Then Medicare requires a face to face office evaluation. Thanks, WS

## 2017-04-12 NOTE — Telephone Encounter (Signed)
Pt aware of process. She will contact vendors

## 2017-04-13 DIAGNOSIS — R270 Ataxia, unspecified: Secondary | ICD-10-CM | POA: Diagnosis not present

## 2017-04-13 DIAGNOSIS — E119 Type 2 diabetes mellitus without complications: Secondary | ICD-10-CM | POA: Diagnosis not present

## 2017-04-13 DIAGNOSIS — R296 Repeated falls: Secondary | ICD-10-CM | POA: Diagnosis not present

## 2017-04-13 DIAGNOSIS — M4800 Spinal stenosis, site unspecified: Secondary | ICD-10-CM | POA: Diagnosis not present

## 2017-04-13 DIAGNOSIS — I10 Essential (primary) hypertension: Secondary | ICD-10-CM | POA: Diagnosis not present

## 2017-04-13 DIAGNOSIS — D649 Anemia, unspecified: Secondary | ICD-10-CM | POA: Diagnosis not present

## 2017-04-16 IMAGING — DX DG CHEST 2V
2 series · 2 of 2 positions shown · non-contrast
Comparison: 02/02/2016.

CLINICAL DATA: Followup right mid lung infiltrate.

EXAM:
CHEST  2 VIEW

[chest pa]
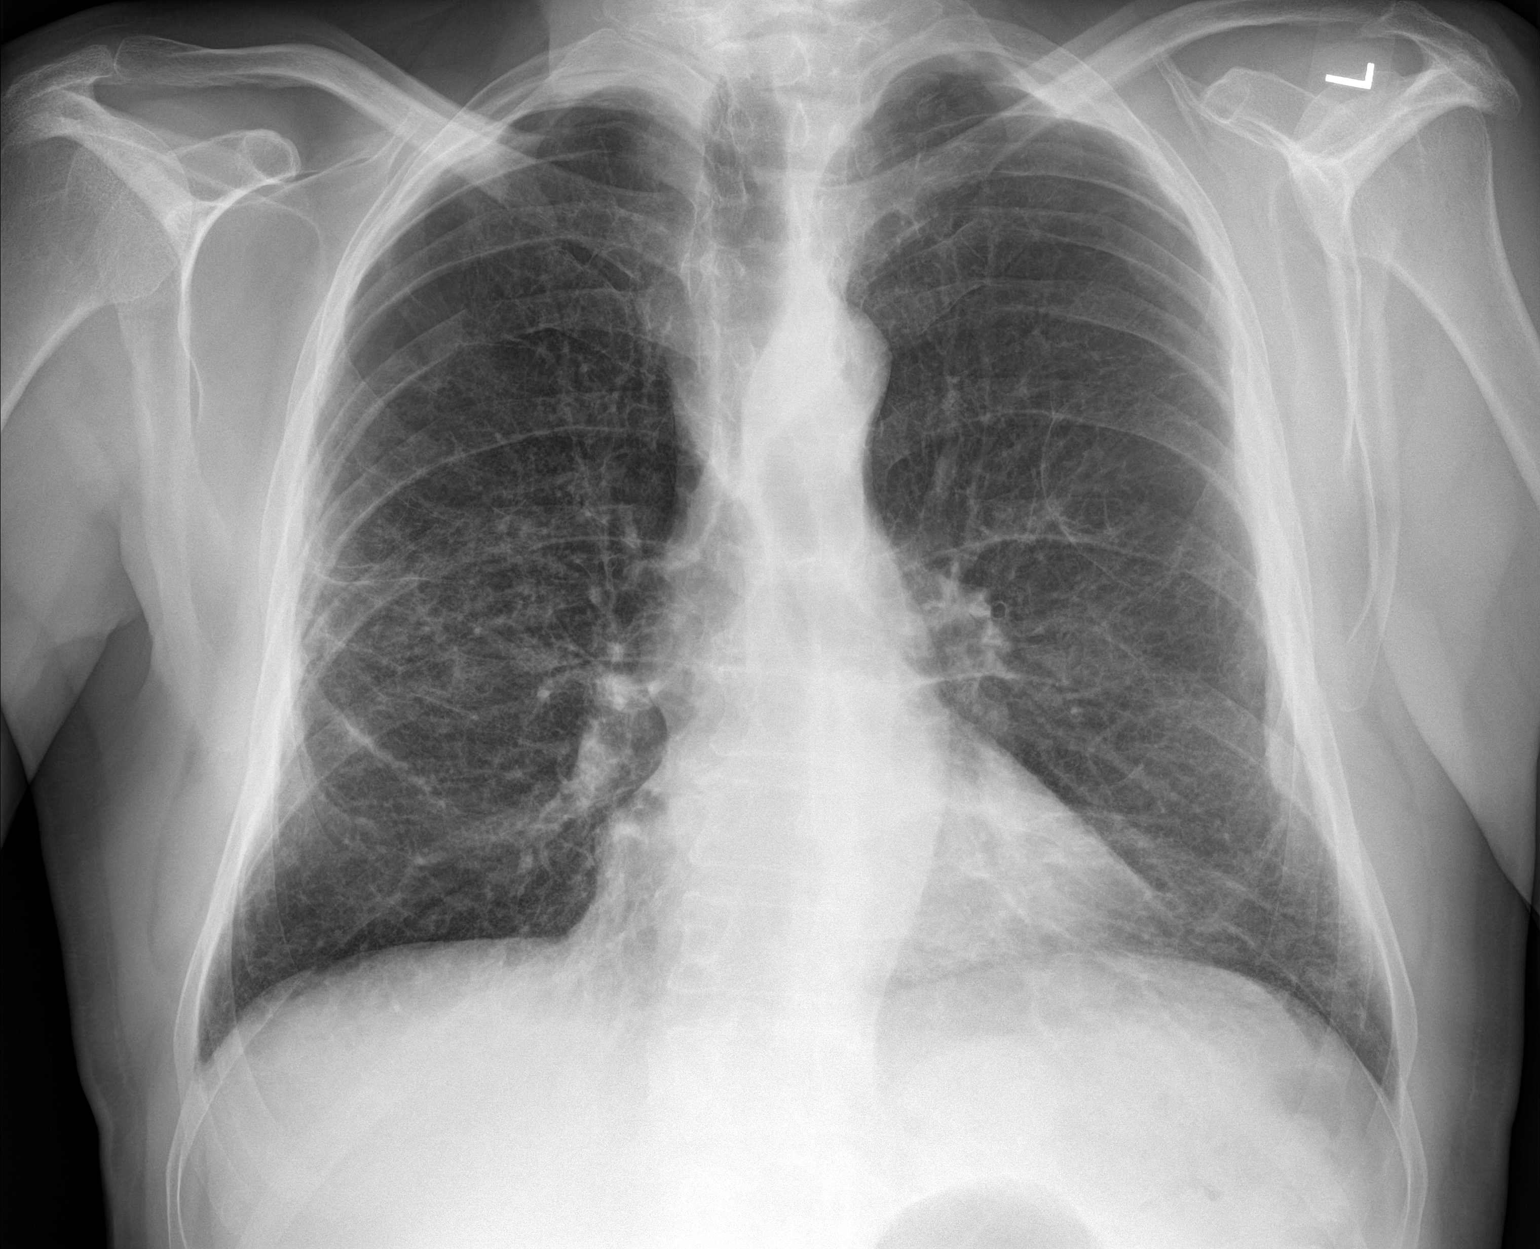

[chest lat]
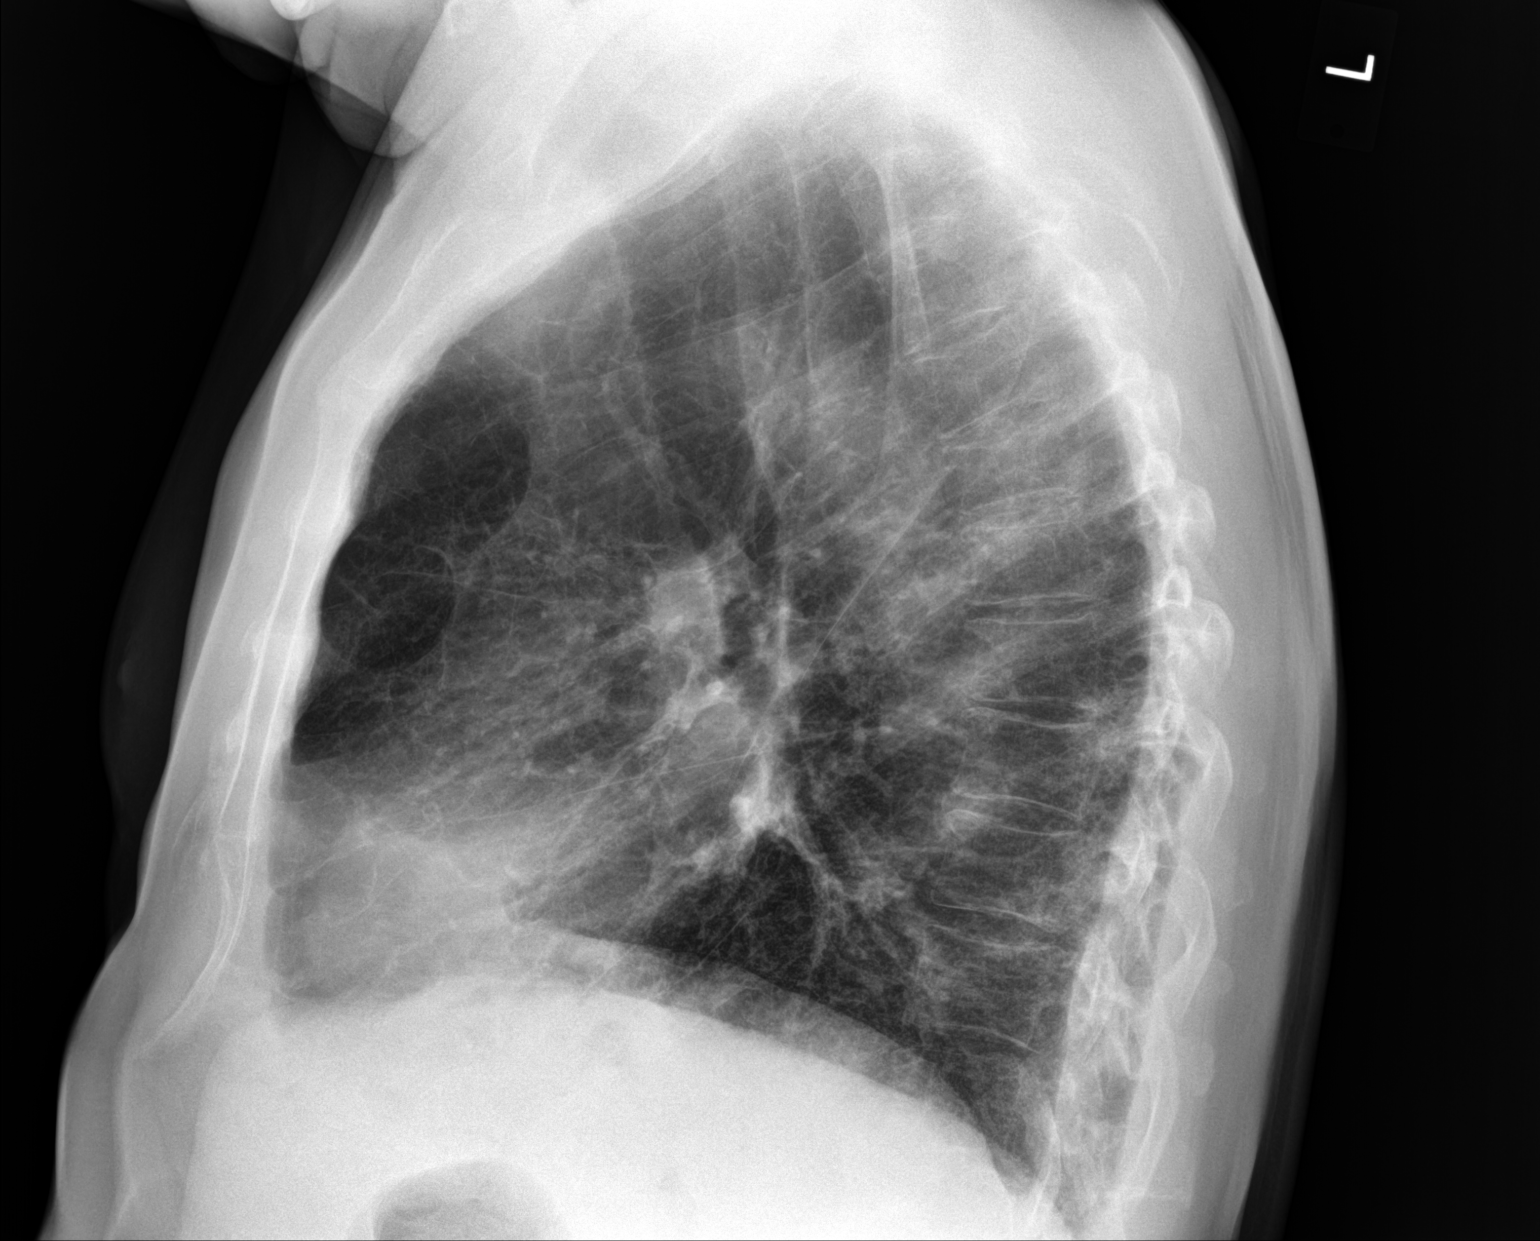

[2 of 2 positions shown; findings below may reference images not displayed]

FINDINGS: Normal sized heart. Decreased patchy opacity in the right mid lung
zone. Clear left lung. The lungs remain hyperexpanded with diffusely
prominent interstitial markings. Diffuse osteopenia and mild
thoracic spine degenerative changes.
IMPRESSION: 1. Improving pneumonia in the right mid lung zone.
2. Stable changes of COPD.

## 2017-04-17 ENCOUNTER — Ambulatory Visit: Payer: Medicare Other | Admitting: Family Medicine

## 2017-04-17 DIAGNOSIS — I10 Essential (primary) hypertension: Secondary | ICD-10-CM | POA: Diagnosis not present

## 2017-04-17 DIAGNOSIS — R270 Ataxia, unspecified: Secondary | ICD-10-CM | POA: Diagnosis not present

## 2017-04-17 DIAGNOSIS — R296 Repeated falls: Secondary | ICD-10-CM | POA: Diagnosis not present

## 2017-04-17 DIAGNOSIS — M4800 Spinal stenosis, site unspecified: Secondary | ICD-10-CM | POA: Diagnosis not present

## 2017-04-17 DIAGNOSIS — D649 Anemia, unspecified: Secondary | ICD-10-CM | POA: Diagnosis not present

## 2017-04-17 DIAGNOSIS — E119 Type 2 diabetes mellitus without complications: Secondary | ICD-10-CM | POA: Diagnosis not present

## 2017-04-18 DIAGNOSIS — E119 Type 2 diabetes mellitus without complications: Secondary | ICD-10-CM | POA: Diagnosis not present

## 2017-04-18 DIAGNOSIS — I10 Essential (primary) hypertension: Secondary | ICD-10-CM | POA: Diagnosis not present

## 2017-04-18 DIAGNOSIS — R270 Ataxia, unspecified: Secondary | ICD-10-CM | POA: Diagnosis not present

## 2017-04-18 DIAGNOSIS — R296 Repeated falls: Secondary | ICD-10-CM | POA: Diagnosis not present

## 2017-04-18 DIAGNOSIS — D649 Anemia, unspecified: Secondary | ICD-10-CM | POA: Diagnosis not present

## 2017-04-18 DIAGNOSIS — M4800 Spinal stenosis, site unspecified: Secondary | ICD-10-CM | POA: Diagnosis not present

## 2017-04-19 DIAGNOSIS — E119 Type 2 diabetes mellitus without complications: Secondary | ICD-10-CM | POA: Diagnosis not present

## 2017-04-19 DIAGNOSIS — E559 Vitamin D deficiency, unspecified: Secondary | ICD-10-CM | POA: Diagnosis not present

## 2017-04-19 DIAGNOSIS — E1142 Type 2 diabetes mellitus with diabetic polyneuropathy: Secondary | ICD-10-CM | POA: Diagnosis not present

## 2017-04-19 DIAGNOSIS — R413 Other amnesia: Secondary | ICD-10-CM | POA: Diagnosis not present

## 2017-04-19 DIAGNOSIS — G609 Hereditary and idiopathic neuropathy, unspecified: Secondary | ICD-10-CM | POA: Diagnosis not present

## 2017-04-20 DIAGNOSIS — R296 Repeated falls: Secondary | ICD-10-CM | POA: Diagnosis not present

## 2017-04-20 DIAGNOSIS — I10 Essential (primary) hypertension: Secondary | ICD-10-CM | POA: Diagnosis not present

## 2017-04-20 DIAGNOSIS — M4800 Spinal stenosis, site unspecified: Secondary | ICD-10-CM | POA: Diagnosis not present

## 2017-04-20 DIAGNOSIS — R270 Ataxia, unspecified: Secondary | ICD-10-CM | POA: Diagnosis not present

## 2017-04-20 DIAGNOSIS — D649 Anemia, unspecified: Secondary | ICD-10-CM | POA: Diagnosis not present

## 2017-04-20 DIAGNOSIS — E119 Type 2 diabetes mellitus without complications: Secondary | ICD-10-CM | POA: Diagnosis not present

## 2017-04-23 ENCOUNTER — Other Ambulatory Visit (HOSPITAL_COMMUNITY): Payer: Self-pay | Admitting: Specialist

## 2017-04-23 DIAGNOSIS — G3184 Mild cognitive impairment, so stated: Secondary | ICD-10-CM

## 2017-04-24 ENCOUNTER — Ambulatory Visit (HOSPITAL_COMMUNITY): Payer: Medicare Other

## 2017-04-24 DIAGNOSIS — R296 Repeated falls: Secondary | ICD-10-CM | POA: Diagnosis not present

## 2017-04-24 DIAGNOSIS — D649 Anemia, unspecified: Secondary | ICD-10-CM | POA: Diagnosis not present

## 2017-04-24 DIAGNOSIS — I10 Essential (primary) hypertension: Secondary | ICD-10-CM | POA: Diagnosis not present

## 2017-04-24 DIAGNOSIS — R270 Ataxia, unspecified: Secondary | ICD-10-CM | POA: Diagnosis not present

## 2017-04-24 DIAGNOSIS — M4800 Spinal stenosis, site unspecified: Secondary | ICD-10-CM | POA: Diagnosis not present

## 2017-04-24 DIAGNOSIS — E119 Type 2 diabetes mellitus without complications: Secondary | ICD-10-CM | POA: Diagnosis not present

## 2017-04-26 DIAGNOSIS — E119 Type 2 diabetes mellitus without complications: Secondary | ICD-10-CM | POA: Diagnosis not present

## 2017-04-26 DIAGNOSIS — R296 Repeated falls: Secondary | ICD-10-CM | POA: Diagnosis not present

## 2017-04-26 DIAGNOSIS — R270 Ataxia, unspecified: Secondary | ICD-10-CM | POA: Diagnosis not present

## 2017-04-26 DIAGNOSIS — D649 Anemia, unspecified: Secondary | ICD-10-CM | POA: Diagnosis not present

## 2017-04-26 DIAGNOSIS — I10 Essential (primary) hypertension: Secondary | ICD-10-CM | POA: Diagnosis not present

## 2017-04-26 DIAGNOSIS — M4800 Spinal stenosis, site unspecified: Secondary | ICD-10-CM | POA: Diagnosis not present

## 2017-04-27 ENCOUNTER — Other Ambulatory Visit (HOSPITAL_COMMUNITY): Payer: Medicare Other

## 2017-05-01 ENCOUNTER — Ambulatory Visit (HOSPITAL_COMMUNITY)
Admission: RE | Admit: 2017-05-01 | Discharge: 2017-05-01 | Disposition: A | Discharge status: Home / Self Care | Payer: Medicare Other | Source: Ambulatory Visit | Attending: Specialist | Admitting: Specialist

## 2017-05-01 ENCOUNTER — Ambulatory Visit: Payer: Medicare Other | Admitting: Family Medicine

## 2017-05-01 DIAGNOSIS — S0990XA Unspecified injury of head, initial encounter: Secondary | ICD-10-CM | POA: Diagnosis not present

## 2017-05-01 DIAGNOSIS — G3184 Mild cognitive impairment, so stated: Secondary | ICD-10-CM | POA: Insufficient documentation

## 2017-05-01 DIAGNOSIS — Z8673 Personal history of transient ischemic attack (TIA), and cerebral infarction without residual deficits: Secondary | ICD-10-CM | POA: Diagnosis not present

## 2017-05-01 DIAGNOSIS — G9389 Other specified disorders of brain: Secondary | ICD-10-CM | POA: Diagnosis not present

## 2017-05-02 ENCOUNTER — Encounter: Payer: Self-pay | Admitting: Family Medicine

## 2017-05-02 ENCOUNTER — Ambulatory Visit (INDEPENDENT_AMBULATORY_CARE_PROVIDER_SITE_OTHER): Payer: Medicare Other | Admitting: Family Medicine

## 2017-05-02 VITALS — BP 92/53 | HR 88

## 2017-05-02 DIAGNOSIS — H6121 Impacted cerumen, right ear: Secondary | ICD-10-CM

## 2017-05-02 DIAGNOSIS — R42 Dizziness and giddiness: Secondary | ICD-10-CM | POA: Diagnosis not present

## 2017-05-02 DIAGNOSIS — R399 Unspecified symptoms and signs involving the genitourinary system: Secondary | ICD-10-CM

## 2017-05-02 LAB — URINALYSIS
BILIRUBIN UA: NEGATIVE
Glucose, UA: NEGATIVE
Ketones, UA: NEGATIVE
Leukocytes, UA: NEGATIVE
Nitrite, UA: NEGATIVE
PH UA: 5.5 (ref 5.0–7.5)
PROTEIN UA: NEGATIVE
RBC, UA: NEGATIVE
Specific Gravity, UA: 1.015 (ref 1.005–1.030)
Urobilinogen, Ur: 0.2 mg/dL (ref 0.2–1.0)

## 2017-05-02 MED ORDER — LISINOPRIL 10 MG PO TABS: 5.0000 mg | ORAL_TABLET | Freq: Every day | ORAL | 3 refills | Status: DC

## 2017-05-02 MED ORDER — MECLIZINE HCL 25 MG PO TABS: 25.0000 mg | ORAL_TABLET | Freq: Four times a day (QID) | ORAL | 2 refills | Status: DC

## 2017-05-02 NOTE — Progress Notes (Signed)
Subjective:  Patient ID: [name], male    DOB: 07-Jun-1946  Age: 71 y.o. MRN: [id number]  CC: Dizziness (pt here today c/o waking up this morning "with the bed spinning and nausea")   HPI ERICSON NAFZIGER presents for Acute onset this morning of vertigo. When he awoke and turned over the bed started spinning. As it faded away within a few minutes he was able to make his way to the bathroom. When he got there the toilet started spinning. This just lasted for a few moments. He's had no nausea or headache. His sugar was not low. No recurrence no nausea back to normal now. However he notes that he's had a little bit of cough and his ears are stuffed up. He is noted to bit of pressure and diminished hearing. He is not having acute urinary symptoms currently but is due a follow-up on a recent infection and brought in a urine specimen today.  Depression screen [facility] 2/9 05/02/2017 03/26/2017 03/08/2017  Decreased Interest 0 1 1  Down, Depressed, Hopeless 0 - 1  PHQ - 2 Score 0 1 2  Altered sleeping - 1 1  Tired, decreased energy - 0 0  Change in appetite - 1 1  Feeling bad or failure about yourself  - 1 1  Trouble concentrating - 0 0  Moving slowly or fidgety/restless - 0 0  Suicidal thoughts - 0 0  PHQ-9 Score - 4 5    History Beaux has a past medical history of AAA (abdominal aortic aneurysm) (HCC); Anemia; Ataxia; Diabetes mellitus without complication (HCC); History of gallstones; Hyperlipidemia; Hypertension; and Vitamin D deficiency.   He has a past surgical history that includes ERCP; Inguinal hernia repair; and Tonsillectomy.   His family history includes Diabetes in his brother and mother; Hip fracture in his mother; Hyperlipidemia in his brother; Hypertension in his brother; Lupus in his sister; Pulmonary embolism in his mother; Scleroderma in his sister.He reports that he quit smoking about 8 years ago. His smoking use included Cigarettes. He started smoking about 50 years ago. He has a  40.00 pack-year smoking history. He has never used smokeless tobacco. He reports that he does not drink alcohol or use drugs.    ROS Review of Systems  Constitutional: Negative for chills, diaphoresis and fever.  HENT: Negative for rhinorrhea and sore throat.   Respiratory: Positive for cough. Negative for shortness of breath.   Cardiovascular: Negative for chest pain.  Gastrointestinal: Negative for abdominal pain.  Musculoskeletal: Negative for arthralgias and myalgias.  Skin: Negative for rash.  Neurological: Positive for dizziness. Negative for weakness, light-headedness and headaches.    Objective:  BP (!) 92/53   Pulse 88   BP Readings from Last 3 Encounters:  05/02/17 (!) 92/53  03/26/17 106/61  03/08/17 95/61    Wt Readings from Last 3 Encounters:  03/26/17 182 lb (82.6 kg)  03/08/17 179 lb (81.2 kg)  02/16/17 185 lb (83.9 kg)     Physical Exam  Constitutional: He appears well-developed and well-nourished.  HENT:  Head: Normocephalic and atraumatic.  Right Ear: Tympanic membrane and external ear normal. No middle ear effusion. Decreased hearing is noted.  Left Ear: Tympanic membrane and external ear normal.  No middle ear effusion. No decreased hearing is noted.  Mouth/Throat: No oropharyngeal exudate or posterior oropharyngeal erythema.  Right external auditory canal occluded by cerumen. Warm water lavage removed a large clump of cerumen. Subsequently the canal is clean and TM visible with  normal appearance with typical landmarks.  Eyes: Pupils are equal, round, and reactive to light.  Neck: Normal range of motion. Neck supple.  Cardiovascular: Normal rate and regular rhythm.   No murmur heard. Pulmonary/Chest: Breath sounds normal. No respiratory distress.  Abdominal: Soft. Bowel sounds are normal. He exhibits no mass. There is no tenderness.  Musculoskeletal: He exhibits no edema or tenderness.  Wheelchair-bound  Vitals reviewed.     Assessment & Plan:    [name] was seen today for dizziness.  Diagnoses and all orders for this visit:  Dizziness -     Urine Culture -     Urinalysis  UTI symptoms -     Urine Culture  Vertigo  Impacted cerumen of right ear  Other orders -     meclizine (ANTIVERT) 25 MG tablet; Take 1 tablet (25 mg total) by mouth 4 (four) times daily. -     lisinopril (PRINIVIL,ZESTRIL) 10 MG tablet; Take 0.5 tablets (5 mg total) by mouth daily.       I have changed Mr. Lajuana RippleDunlap's lisinopril. I am also having him start on meclizine. Additionally, I am having him maintain his aspirin, Insulin Glargine, glucose blood, neomycin-polymyxin-hydrocortisone, omeprazole, Dulaglutide, rivastigmine, Acetaminophen (TYLENOL ARTHRITIS EXT RELIEF PO), metFORMIN, and Insulin Pen Needle.  Allergies as of 05/02/2017      Reactions   Donepezil Nausea And Vomiting      Medication List       Accurate as of 05/02/17  3:50 PM. Always use your most recent med list.          aspirin 81 MG tablet Take 81 mg by mouth daily.   Dulaglutide 1.5 MG/0.5ML Sopn Commonly known as:  TRULICITY Inject 1.5 mg into the skin once a week.   glucose blood test strip Check fasting and 2 hours after biggest meal   Insulin Glargine 100 UNIT/ML Solostar Pen Commonly known as:  LANTUS SOLOSTAR pt doing 45 units daily   Insulin Pen Needle 32G X 6 MM Misc Commonly known as:  SURE COMFORT PEN NEEDLES Use to check BS   lisinopril 10 MG tablet Commonly known as:  PRINIVIL,ZESTRIL Take 0.5 tablets (5 mg total) by mouth daily.   meclizine 25 MG tablet Commonly known as:  ANTIVERT Take 1 tablet (25 mg total) by mouth 4 (four) times daily.   metFORMIN 1000 MG tablet Commonly known as:  GLUCOPHAGE Take 1 tablet (1,000 mg total) by mouth 2 (two) times daily with a meal.   neomycin-polymyxin-hydrocortisone 3.5-10000-1 OTIC suspension Commonly known as:  CORTISPORIN Place 3 drops into the left ear 4 (four) times daily.   omeprazole 20 MG  capsule Commonly known as:  PRILOSEC TAKE 1 CAPSULE DAILY   rivastigmine 3 MG capsule Commonly known as:  EXELON Take 3 mg by mouth 2 (two) times daily.   TYLENOL ARTHRITIS EXT RELIEF PO Take by mouth.        Follow-up: Return in about 1 month (around 06/01/2017).  Mechele ClaudeWarren Rayshawn Maney, M.D.

## 2017-05-04 ENCOUNTER — Telehealth: Payer: Self-pay | Admitting: *Deleted

## 2017-05-04 NOTE — Telephone Encounter (Signed)
Patients wife states that patient is still complaining with dizziness. She states that patient states the meclizine is not helping at all. Please reveiw

## 2017-05-05 LAB — URINE CULTURE

## 2017-05-06 ENCOUNTER — Other Ambulatory Visit: Payer: Self-pay | Admitting: Family Medicine

## 2017-05-07 ENCOUNTER — Other Ambulatory Visit: Payer: Self-pay | Admitting: Family Medicine

## 2017-05-07 MED ORDER — CIPROFLOXACIN HCL 500 MG PO TABS: 500.0000 mg | ORAL_TABLET | Freq: Two times a day (BID) | ORAL | 0 refills | Status: DC

## 2017-05-07 NOTE — Telephone Encounter (Signed)
Spoke to pt's wife Steward DroneBrenda and advised rx sent to pharmacy and dizziness should stop after UTI clears.

## 2017-05-07 NOTE — Telephone Encounter (Signed)
Please contact the patient I sent in some antibiotics for a bladder infection.  As that resolves I believe the dizziness will take care of itself.  Please pick up the medicine right away.

## 2017-05-11 DIAGNOSIS — R413 Other amnesia: Secondary | ICD-10-CM | POA: Diagnosis not present

## 2017-05-11 DIAGNOSIS — R2681 Unsteadiness on feet: Secondary | ICD-10-CM | POA: Diagnosis not present

## 2017-05-11 DIAGNOSIS — G3184 Mild cognitive impairment, so stated: Secondary | ICD-10-CM | POA: Diagnosis not present

## 2017-05-11 DIAGNOSIS — E1142 Type 2 diabetes mellitus with diabetic polyneuropathy: Secondary | ICD-10-CM | POA: Diagnosis not present

## 2017-05-15 ENCOUNTER — Telehealth: Payer: Self-pay | Admitting: Family Medicine

## 2017-05-15 MED ORDER — VITAMIN D (ERGOCALCIFEROL) 1.25 MG (50000 UNIT) PO CAPS: 50000.0000 [IU] | ORAL_CAPSULE | ORAL | 0 refills | Status: DC

## 2017-05-28 NOTE — Telephone Encounter (Signed)
Spoke to pt's wife and she said she had just been calling about the Vit D which has already been taken care of.

## 2017-05-29 ENCOUNTER — Ambulatory Visit: Payer: Medicare Other | Admitting: Family Medicine

## 2017-06-08 ENCOUNTER — Ambulatory Visit (INDEPENDENT_AMBULATORY_CARE_PROVIDER_SITE_OTHER): Payer: Medicare Other | Admitting: Family Medicine

## 2017-06-08 ENCOUNTER — Encounter: Payer: Self-pay | Admitting: Family Medicine

## 2017-06-08 VITALS — BP 101/62 | HR 78 | Temp 97.7°F | Ht 72.0 in

## 2017-06-08 DIAGNOSIS — Z794 Long term (current) use of insulin: Secondary | ICD-10-CM

## 2017-06-08 DIAGNOSIS — R278 Other lack of coordination: Secondary | ICD-10-CM | POA: Diagnosis not present

## 2017-06-08 DIAGNOSIS — E118 Type 2 diabetes mellitus with unspecified complications: Secondary | ICD-10-CM | POA: Diagnosis not present

## 2017-06-08 DIAGNOSIS — E782 Mixed hyperlipidemia: Secondary | ICD-10-CM

## 2017-06-08 DIAGNOSIS — E1165 Type 2 diabetes mellitus with hyperglycemia: Secondary | ICD-10-CM | POA: Diagnosis not present

## 2017-06-08 DIAGNOSIS — F09 Unspecified mental disorder due to known physiological condition: Secondary | ICD-10-CM

## 2017-06-08 DIAGNOSIS — I10 Essential (primary) hypertension: Secondary | ICD-10-CM

## 2017-06-08 DIAGNOSIS — Z87898 Personal history of other specified conditions: Secondary | ICD-10-CM | POA: Diagnosis not present

## 2017-06-08 DIAGNOSIS — E559 Vitamin D deficiency, unspecified: Secondary | ICD-10-CM | POA: Diagnosis not present

## 2017-06-08 DIAGNOSIS — IMO0002 Reserved for concepts with insufficient information to code with codable children: Secondary | ICD-10-CM

## 2017-06-10 ENCOUNTER — Encounter: Payer: Self-pay | Admitting: Family Medicine

## 2017-06-10 NOTE — Progress Notes (Signed)
Subjective:  Patient ID: Jon [name],  male    DOB: [birth year]  Age: 71 y.o.    CC: Follow-up (pt here today for 2 month follow up of his chronic medical conditions)   HPI Jon [name] presents for concern that he is falling more. He is weaker. He is unable to get around wel. He needs help with ambulation.He is also here for follow-up of hypertension. Patient has no history of headache chest pain or shortness of breath or recent cough. Patient also denies symptoms of TIA such as numbness weakness lateralizing.  Patient denies side effects from medication. States taking it regularly.  Patient also  in for follow-up of elevated cholesterol. Doing well without complaints on current medication. Denies side effects of statin including myalgia and arthralgia and nausea. Also in today for liver function testing. Currently no chest pain, shortness of breath or other cardiovascular related symptoms noted.  Follow-up of diabetes. Patient does not check blood sugar at home. Patient denies symptoms such as polyuria, polydipsia, excessive hunger, nausea No significant hypoglycemic spells noted. Medications reviewed. Pt reports taking them regularly. Pt. denies complication/adverse reaction today.    History Jon [name] has a past medical history of AAA (abdominal aortic aneurysm) ([place]), Anemia, Ataxia, Diabetes mellitus without complication ([place]), History of gallstones, Hyperlipidemia, Hypertension, and Vitamin D deficiency.   He has a past surgical history that includes ERCP; Inguinal hernia repair; and Tonsillectomy.   His family history includes Diabetes in his brother and mother; Hip fracture in his mother; Hyperlipidemia in his brother; Hypertension in his brother; Lupus in his sister; Pulmonary embolism in his mother; Scleroderma in his sister.He reports that he quit smoking about 8 years ago. His smoking use included cigarettes. He started smoking about 50 years ago. He has a 40.00 pack-year smoking  history. he has never used smokeless tobacco. He reports that he does not drink alcohol or use drugs.  Current Outpatient Medications on File Prior to Visit  Medication Sig Dispense Refill  . Acetaminophen (TYLENOL ARTHRITIS EXT RELIEF PO) Take by mouth.    [name] aspirin 81 MG tablet Take 81 mg by mouth daily.    . Dulaglutide (TRULICITY) 1.5 ZO/1.0RU SOPN Inject 1.5 mg into the skin once a week. 13 pen 3  . glucose blood test strip Check fasting and 2 hours after biggest meal 200 each 3  . Insulin Glargine (LANTUS SOLOSTAR) 100 UNIT/ML Solostar Pen pt doing 45 units daily 45 mL 1  . Insulin Pen Needle (SURE COMFORT PEN NEEDLES) 32G X 6 MM MISC Use to check BS 100 each 3  . lisinopril (PRINIVIL,ZESTRIL) 10 MG tablet Take 0.5 tablets (5 mg total) by mouth daily. 90 tablet 3  . meclizine (ANTIVERT) 25 MG tablet Take 1 tablet (25 mg total) by mouth 4 (four) times daily. 30 tablet 2  . metFORMIN (GLUCOPHAGE) 1000 MG tablet Take 1 tablet (1,000 mg total) by mouth 2 (two) times daily with a meal. 180 tablet 3  . neomycin-polymyxin-hydrocortisone (CORTISPORIN) 3.5-10000-1 OTIC suspension Place 3 drops into the left ear 4 (four) times daily. 10 mL 0  . omeprazole (PRILOSEC) 20 MG capsule TAKE 1 CAPSULE DAILY 90 capsule 1  . rivastigmine (EXELON) 3 MG capsule Take 3 mg by mouth 2 (two) times daily.    . Vitamin D, Ergocalciferol, (DRISDOL) 50000 units CAPS capsule Take 1 capsule (50,000 Units total) every 7 (seven) days by mouth. 12 capsule 0   No current facility-administered medications on file prior to visit.  ROS Review of Systems  Constitutional: Negative for chills, diaphoresis, fever and unexpected weight change.  HENT: Negative for congestion, hearing loss, rhinorrhea and sore throat.   Eyes: Negative for visual disturbance.  Respiratory: Negative for cough and shortness of breath.   Cardiovascular: Negative for chest pain.  Gastrointestinal: Negative for abdominal pain, constipation and  diarrhea.  Genitourinary: Negative for dysuria and flank pain.  Musculoskeletal: Negative for arthralgias and joint swelling.  Skin: Negative for rash.  Neurological: Negative for dizziness and headaches.  Psychiatric/Behavioral: Negative for dysphoric mood and sleep disturbance.    Objective:  BP 101/62   Pulse 78   Temp 97.7 F (36.5 C) (Oral)   Ht 6' (1.829 m)   BMI 24.68 kg/m   BP Readings from Last 3 Encounters:  06/08/17 101/62  05/02/17 (!) 92/53  03/26/17 106/61    Wt Readings from Last 3 Encounters:  03/26/17 182 lb (82.6 kg)  03/08/17 179 lb (81.2 kg)  02/16/17 185 lb (83.9 kg)     Physical Exam  Constitutional: He is oriented to person, place, and time. He appears well-developed and well-nourished. No distress.  HENT:  Head: Normocephalic and atraumatic.  Right Ear: External ear normal.  Left Ear: External ear normal.  Nose: Nose normal.  Mouth/Throat: Oropharynx is clear and moist.  Eyes: Conjunctivae and EOM are normal. Pupils are equal, round, and reactive to light.  Neck: Normal range of motion. Neck supple. No thyromegaly present.  Cardiovascular: Normal rate, regular rhythm and normal heart sounds.  No murmur heard. Pulmonary/Chest: Effort normal and breath sounds normal. No respiratory distress. He has no wheezes. He has no rales.  Abdominal: Soft. Bowel sounds are normal. He exhibits no distension. There is no tenderness.  Lymphadenopathy:    He has no cervical adenopathy.  Neurological: He is alert and oriented to person, place, and time. He has normal reflexes.  Skin: Skin is warm and dry.  Psychiatric: He has a normal mood and affect. His behavior is normal.    Diabetic Foot Exam - Simple   No data filed        Assessment & Plan:   Rc was seen today for follow-up.  Diagnoses and all orders for this visit:  Essential hypertension -     CBC with Differential/Platelet  Mild cognitive disorder -     CT HEAD W & WO CONTRAST;  Future  History of unsteady gait -     CT HEAD W & WO CONTRAST; Future  Muscular incoordination -     CT HEAD W & WO CONTRAST; Future  Vitamin D deficiency -     VITAMIN D 25 Hydroxy (Vit-D Deficiency, Fractures)  Mixed hyperlipidemia -     CMP14+EGFR -     Lipid panel  Uncontrolled type 2 diabetes mellitus with complication, with long-term current use of insulin (HCC) -     Bayer DCA Hb A1c Waived   I have discontinued Jon [name]'s ciprofloxacin. I am also having him maintain his aspirin, Insulin Glargine, glucose blood, neomycin-polymyxin-hydrocortisone, omeprazole, Dulaglutide, rivastigmine, Acetaminophen (TYLENOL ARTHRITIS EXT RELIEF PO), metFORMIN, Insulin Pen Needle, meclizine, lisinopril, and Vitamin D (Ergocalciferol).  No orders of the defined types were placed in this encounter.    Follow-up: Return in about 3 months (around 09/06/2017).  Claretta Fraise, M.D.

## 2017-06-25 ENCOUNTER — Ambulatory Visit (HOSPITAL_COMMUNITY)
Admission: RE | Admit: 2017-06-25 | Discharge: 2017-06-25 | Disposition: A | Discharge status: Home / Self Care | Payer: Medicare Other | Source: Ambulatory Visit | Attending: Family Medicine | Admitting: Family Medicine

## 2017-06-25 DIAGNOSIS — R9082 White matter disease, unspecified: Secondary | ICD-10-CM | POA: Insufficient documentation

## 2017-06-25 DIAGNOSIS — R278 Other lack of coordination: Secondary | ICD-10-CM | POA: Diagnosis not present

## 2017-06-25 DIAGNOSIS — J320 Chronic maxillary sinusitis: Secondary | ICD-10-CM | POA: Insufficient documentation

## 2017-06-25 DIAGNOSIS — R531 Weakness: Secondary | ICD-10-CM | POA: Diagnosis not present

## 2017-06-25 DIAGNOSIS — Z87898 Personal history of other specified conditions: Secondary | ICD-10-CM | POA: Diagnosis not present

## 2017-06-25 DIAGNOSIS — F09 Unspecified mental disorder due to known physiological condition: Secondary | ICD-10-CM

## 2017-06-25 MED ORDER — IOPAMIDOL (ISOVUE-300) INJECTION 61%
75.0000 mL | Freq: Once | INTRAVENOUS | Status: AC | PRN
  Administered 2017-06-25: 75 mL via INTRAVENOUS

## 2017-06-28 ENCOUNTER — Other Ambulatory Visit: Payer: Self-pay | Admitting: *Deleted

## 2017-06-28 MED ORDER — OMEPRAZOLE 20 MG PO CPDR: 20.0000 mg | DELAYED_RELEASE_CAPSULE | Freq: Every day | ORAL | 1 refills | Status: DC

## 2017-06-29 LAB — CMP14+EGFR
A/G RATIO: 1.5 (ref 1.2–2.2)
ALBUMIN: 4.5 g/dL (ref 3.6–4.8)
ALT: 48 IU/L — ABNORMAL HIGH (ref 0–44)
AST: 36 IU/L (ref 0–40)
Alkaline Phosphatase: 131 IU/L — ABNORMAL HIGH (ref 39–117)
BILIRUBIN TOTAL: 0.3 mg/dL (ref 0.0–1.2)
BUN / CREAT RATIO: 21 (ref 10–24)
BUN: 16 mg/dL (ref 8–27)
CHLORIDE: 99 mmol/L (ref 96–106)
CO2: 22 mmol/L (ref 20–29)
Calcium: 9.8 mg/dL (ref 8.6–10.2)
Creatinine, Ser: 0.77 mg/dL (ref 0.76–1.27)
GFR, EST AFRICAN AMERICAN: 107 mL/min/{1.73_m2} (ref >59)
GFR, EST NON AFRICAN AMERICAN: 93 mL/min/{1.73_m2} (ref >59)
GLOBULIN, TOTAL: 3 g/dL (ref 1.5–4.5)
Glucose: 133 mg/dL — ABNORMAL HIGH (ref 65–99)
POTASSIUM: 4.4 mmol/L (ref 3.5–5.2)
SODIUM: 136 mmol/L (ref 134–144)
TOTAL PROTEIN: 7.5 g/dL (ref 6.0–8.5)

## 2017-06-29 LAB — CBC WITH DIFFERENTIAL/PLATELET
BASOS: 0 %
Basophils Absolute: 0 10*3/uL (ref 0.0–0.2)
EOS (ABSOLUTE): 0.2 10*3/uL (ref 0.0–0.4)
EOS: 2 %
HEMATOCRIT: 43.6 % (ref 37.5–51.0)
HEMOGLOBIN: 14.8 g/dL (ref 13.0–17.7)
IMMATURE GRANS (ABS): 0.1 10*3/uL (ref 0.0–0.1)
Immature Granulocytes: 1 %
LYMPHS: 38 %
Lymphocytes Absolute: 4.3 10*3/uL — ABNORMAL HIGH (ref 0.7–3.1)
MCH: 30.5 pg (ref 26.6–33.0)
MCHC: 33.9 g/dL (ref 31.5–35.7)
MCV: 90 fL (ref 79–97)
Monocytes Absolute: 0.9 10*3/uL (ref 0.1–0.9)
Monocytes: 8 %
NEUTROS ABS: 5.9 10*3/uL (ref 1.4–7.0)
Neutrophils: 51 %
PLATELETS: 202 10*3/uL (ref 150–379)
RBC: 4.85 x10E6/uL (ref 4.14–5.80)
RDW: 13.2 % (ref 12.3–15.4)
WBC: 11.4 10*3/uL — ABNORMAL HIGH (ref 3.4–10.8)

## 2017-06-29 LAB — HGB A1C W/O EAG

## 2017-06-29 LAB — LIPID PANEL
CHOL/HDL RATIO: 3.8 ratio (ref 0.0–5.0)
Cholesterol, Total: 202 mg/dL — ABNORMAL HIGH (ref 100–199)
HDL: 53 mg/dL (ref >39)
LDL Calculated: 113 mg/dL — ABNORMAL HIGH (ref 0–99)
TRIGLYCERIDES: 181 mg/dL — AB (ref 0–149)
VLDL Cholesterol Cal: 36 mg/dL (ref 5–40)

## 2017-06-29 LAB — VITAMIN D 25 HYDROXY (VIT D DEFICIENCY, FRACTURES): Vit D, 25-Hydroxy: 37.2 ng/mL (ref 30.0–100.0)

## 2017-07-05 ENCOUNTER — Other Ambulatory Visit: Payer: Self-pay | Admitting: Family Medicine

## 2017-07-17 ENCOUNTER — Other Ambulatory Visit: Payer: Self-pay | Admitting: Family Medicine

## 2017-07-17 MED ORDER — OMEPRAZOLE 20 MG PO CPDR: 20.0000 mg | DELAYED_RELEASE_CAPSULE | Freq: Every day | ORAL | 3 refills | Status: DC

## 2017-07-17 NOTE — Telephone Encounter (Signed)
Patient's wife had contacted [facility] and they had not received prescription sent in on 06/16/17.  Refilled Omeprazole 20 mg, #90, 3 refills today

## 2017-07-18 ENCOUNTER — Encounter: Payer: Self-pay | Admitting: Family Medicine

## 2017-09-07 ENCOUNTER — Ambulatory Visit (INDEPENDENT_AMBULATORY_CARE_PROVIDER_SITE_OTHER): Payer: Medicare Other | Admitting: Family Medicine

## 2017-09-07 ENCOUNTER — Encounter: Payer: Self-pay | Admitting: Family Medicine

## 2017-09-07 VITALS — BP 109/68 | HR 84 | Temp 97.6°F | Ht 72.0 in | Wt 187.4 lb

## 2017-09-07 DIAGNOSIS — Z7409 Other reduced mobility: Secondary | ICD-10-CM | POA: Diagnosis not present

## 2017-09-07 DIAGNOSIS — I10 Essential (primary) hypertension: Secondary | ICD-10-CM | POA: Diagnosis not present

## 2017-09-07 DIAGNOSIS — N400 Enlarged prostate without lower urinary tract symptoms: Secondary | ICD-10-CM | POA: Diagnosis not present

## 2017-09-07 DIAGNOSIS — E1165 Type 2 diabetes mellitus with hyperglycemia: Secondary | ICD-10-CM | POA: Diagnosis not present

## 2017-09-07 DIAGNOSIS — E118 Type 2 diabetes mellitus with unspecified complications: Secondary | ICD-10-CM

## 2017-09-07 DIAGNOSIS — Z125 Encounter for screening for malignant neoplasm of prostate: Secondary | ICD-10-CM | POA: Diagnosis not present

## 2017-09-07 DIAGNOSIS — E559 Vitamin D deficiency, unspecified: Secondary | ICD-10-CM

## 2017-09-07 DIAGNOSIS — Z794 Long term (current) use of insulin: Secondary | ICD-10-CM

## 2017-09-07 DIAGNOSIS — E782 Mixed hyperlipidemia: Secondary | ICD-10-CM | POA: Diagnosis not present

## 2017-09-07 DIAGNOSIS — F09 Unspecified mental disorder due to known physiological condition: Secondary | ICD-10-CM

## 2017-09-07 DIAGNOSIS — IMO0002 Reserved for concepts with insufficient information to code with codable children: Secondary | ICD-10-CM

## 2017-09-07 LAB — BAYER DCA HB A1C WAIVED: HB A1C (BAYER DCA - WAIVED): 7.1 % — ABNORMAL HIGH (ref <7.0)

## 2017-09-07 MED ORDER — GLUCOSE BLOOD VI STRP: ORAL_STRIP | 3 refills | Status: DC

## 2017-09-07 MED ORDER — DULAGLUTIDE 1.5 MG/0.5ML ~~LOC~~ SOAJ: 1.5000 mg | SUBCUTANEOUS | 3 refills | Status: DC

## 2017-09-07 MED ORDER — INSULIN GLARGINE 100 UNIT/ML SOLOSTAR PEN: PEN_INJECTOR | SUBCUTANEOUS | 1 refills | Status: DC

## 2017-09-07 MED ORDER — RIVASTIGMINE TARTRATE 4.5 MG PO CAPS: 4.5000 mg | ORAL_CAPSULE | Freq: Two times a day (BID) | ORAL | 3 refills | Status: DC

## 2017-09-07 MED ORDER — INSULIN PEN NEEDLE 32G X 6 MM MISC: 3 refills | Status: DC

## 2017-09-07 NOTE — Progress Notes (Signed)
Subjective:  Patient ID: Jon [name],  male    DOB: 1945-08-17  Age: 72 y.o.    CC: Diabetes   HPI Jon [name] presents for  follow-up of hypertension. Patient has no history of headache chest pain or shortness of breath or recent cough. Patient also denies symptoms of TIA such as numbness weakness lateralizing. Patient checks  blood pressure at home. Recent readings have been good Patient denies side effects from medication. States taking it regularly.  Patient also  in for follow-up of elevated cholesterol. Doing well without complaints on current medication. Denies side effects of statin including myalgia and arthralgia and nausea. Also in today for liver function testing. Currently no chest pain, shortness of breath or other cardiovascular related symptoms noted.  Follow-up of diabetes. Patient does check blood sugar at home. Readings run between 100 and 150 Patient denies symptoms such as polyuria, polydipsia, excessive hunger, nausea No significant hypoglycemic spells noted. Medications reviewed. Pt reports taking them regularly. Pt. denies complication/adverse reaction today.  Patient's wife tells me that he is becoming more forgetful.  He is unable to comprehend conceptual information.  He is not doing well with calculation or judgment skills for instance she cannot discuss the household budget etc. with him anymore.  History Jon [name] has a past medical history of AAA (abdominal aortic aneurysm) (HCC), Anemia, Ataxia, Diabetes mellitus without complication (HCC), History of gallstones, Hyperlipidemia, Hypertension, and Vitamin D deficiency.   He has a past surgical history that includes ERCP; Inguinal hernia repair; and Tonsillectomy.   His family history includes Diabetes in his brother and mother; Hip fracture in his mother; Hyperlipidemia in his brother; Hypertension in his brother; Lupus in his sister; Pulmonary embolism in his mother; Scleroderma in his sister.He reports that  he quit smoking about 8 years ago. His smoking use included cigarettes. He started smoking about 51 years ago. He has a 40.00 pack-year smoking history. he has never used smokeless tobacco. He reports that he does not drink alcohol or use drugs.  Current Outpatient Medications on File Prior to Visit  Medication Sig Dispense Refill  . Acetaminophen (TYLENOL ARTHRITIS EXT RELIEF PO) Take by mouth.    [name] aspirin 81 MG tablet Take 81 mg by mouth daily.    [name] lisinopril (PRINIVIL,ZESTRIL) 10 MG tablet Take 0.5 tablets (5 mg total) by mouth daily. 90 tablet 3  . metFORMIN (GLUCOPHAGE) 1000 MG tablet Take 1 tablet (1,000 mg total) by mouth 2 (two) times daily with a meal. 180 tablet 3  . neomycin-polymyxin-hydrocortisone (CORTISPORIN) 3.5-10000-1 OTIC suspension Place 3 drops into the left ear 4 (four) times daily. 10 mL 0  . omeprazole (PRILOSEC) 20 MG capsule Take 1 capsule (20 mg total) by mouth daily. 90 capsule 3   No current facility-administered medications on file prior to visit.     ROS Review of Systems  Constitutional: Negative for chills, diaphoresis, fever and unexpected weight change.  HENT: Negative for congestion, hearing loss, rhinorrhea and sore throat.   Eyes: Negative for visual disturbance.  Respiratory: Negative for cough and shortness of breath.   Cardiovascular: Negative for chest pain.  Gastrointestinal: Negative for abdominal pain, constipation and diarrhea.  Genitourinary: Negative for dysuria and flank pain.  Musculoskeletal: Positive for gait problem (Legs are weak but he can make transfers he is in a wheelchair today). Negative for arthralgias and joint swelling.  Skin: Negative for rash.  Neurological: Negative for dizziness and headaches.  Psychiatric/Behavioral: Negative for dysphoric mood and sleep  disturbance.    Objective:  BP 109/68   Pulse 84   Temp 97.6 F (36.4 C) (Oral)   Ht 6' (1.829 m)   Wt 187 lb 6 oz (85 kg)   BMI 25.41 kg/m   BP Readings  from Last 3 Encounters:  09/07/17 109/68  06/08/17 101/62  05/02/17 (!) 92/53    Wt Readings from Last 3 Encounters:  09/07/17 187 lb 6 oz (85 kg)  03/26/17 182 lb (82.6 kg)  03/08/17 179 lb (81.2 kg)     Physical Exam  Constitutional: He is oriented to person, place, and time. He appears well-developed and well-nourished. No distress.  HENT:  Head: Normocephalic and atraumatic.  Right Ear: External ear normal.  Left Ear: External ear normal.  Nose: Nose normal.  Mouth/Throat: Oropharynx is clear and moist.  Eyes: Conjunctivae and EOM are normal. Pupils are equal, round, and reactive to light.  Neck: Normal range of motion. Neck supple. No thyromegaly present.  Cardiovascular: Normal rate, regular rhythm and normal heart sounds.  No murmur heard. Pulmonary/Chest: Effort normal and breath sounds normal. No respiratory distress. He has no wheezes. He has no rales.  Abdominal: Soft. Bowel sounds are normal. He exhibits no distension. There is no tenderness.  Lymphadenopathy:    He has no cervical adenopathy.  Neurological: He is alert and oriented to person, place, and time. He has normal reflexes.  Skin: Skin is warm and dry.  Psychiatric: He has a normal mood and affect. His speech is normal and behavior is normal. Cognition and memory are impaired. He expresses impulsivity and inappropriate judgment. He exhibits abnormal recent memory.    Diabetic Foot Exam - Simple   Simple Foot Form Diabetic Foot exam was performed with the following findings:  Yes 09/07/2017  3:15 PM  Visual Inspection No deformities, no ulcerations, no other skin breakdown bilaterally:  Yes Sensation Testing Intact to touch and monofilament testing bilaterally:  Yes Pulse Check Posterior Tibialis and Dorsalis pulse intact bilaterally:  Yes Comments       Assessment & Plan:   Jon [name] was seen today for diabetes.  Diagnoses and all orders for this visit:  Essential hypertension  Uncontrolled type  2 diabetes mellitus with complication, with long-term current use of insulin (HCC) -     Bayer DCA Hb A1c Waived -     Microalbumin / creatinine urine ratio -     Urinalysis  Vitamin D deficiency -     VITAMIN D 25 Hydroxy (Vit-D Deficiency, Fractures)  Mixed hyperlipidemia -     Lipid panel  Special screening for malignant neoplasm of prostate -     PSA, total and free  Benign prostatic hyperplasia, unspecified whether lower urinary tract symptoms present -     PSA, total and free  Immobility  Mild cognitive disorder  Other orders -     Dulaglutide (TRULICITY) 1.5 MG/0.5ML SOPN; Inject 1.5 mg into the skin once a week. -     glucose blood test strip; Check fasting and 2 hours after biggest meal -     Insulin Glargine (LANTUS SOLOSTAR) 100 UNIT/ML Solostar Pen; INJECT 45 UNITS DAILY -     Discontinue: Insulin Pen Needle (SURE COMFORT PEN NEEDLES) 32G X 6 MM MISC; Use to check BS -     rivastigmine (EXELON) 4.5 MG capsule; Take 1 capsule (4.5 mg total) by mouth 2 (two) times daily.   I have discontinued Coron L. Wasko's rivastigmine, Insulin Pen Needle, meclizine, and Vitamin D (  Ergocalciferol). I am also having him start on rivastigmine. Additionally, I am having him maintain his aspirin, neomycin-polymyxin-hydrocortisone, Acetaminophen (TYLENOL ARTHRITIS EXT RELIEF PO), metFORMIN, lisinopril, omeprazole, Dulaglutide, glucose blood, and Insulin Glargine.  Meds ordered this encounter  Medications  . Dulaglutide (TRULICITY) 1.5 MG/0.5ML SOPN    Sig: Inject 1.5 mg into the skin once a week.    Dispense:  13 pen    Refill:  3  . glucose blood test strip    Sig: Check fasting and 2 hours after biggest meal    Dispense:  200 each    Refill:  3  . Insulin Glargine (LANTUS SOLOSTAR) 100 UNIT/ML Solostar Pen    Sig: INJECT 45 UNITS DAILY    Dispense:  45 mL    Refill:  1  . DISCONTD: Insulin Pen Needle (SURE COMFORT PEN NEEDLES) 32G X 6 MM MISC    Sig: Use to check BS     Dispense:  100 each    Refill:  3  . rivastigmine (EXELON) 4.5 MG capsule    Sig: Take 1 capsule (4.5 mg total) by mouth 2 (two) times daily.    Dispense:  180 capsule    Refill:  3     Follow-up: Return in about 3 months (around 12/08/2017), or if symptoms worsen or fail to improve.  Mechele ClaudeWarren Velva Molinari, M.D.

## 2017-09-08 LAB — LIPID PANEL
CHOL/HDL RATIO: 4.2 ratio (ref 0.0–5.0)
Cholesterol, Total: 216 mg/dL — ABNORMAL HIGH (ref 100–199)
HDL: 51 mg/dL (ref >39)
LDL Calculated: 111 mg/dL — ABNORMAL HIGH (ref 0–99)
Triglycerides: 270 mg/dL — ABNORMAL HIGH (ref 0–149)
VLDL Cholesterol Cal: 54 mg/dL — ABNORMAL HIGH (ref 5–40)

## 2017-09-08 LAB — PSA, TOTAL AND FREE
PROSTATE SPECIFIC AG, SERUM: 0.8 ng/mL (ref 0.0–4.0)
PSA FREE PCT: 38.8 %
PSA, Free: 0.31 ng/mL

## 2017-09-08 LAB — VITAMIN D 25 HYDROXY (VIT D DEFICIENCY, FRACTURES): VIT D 25 HYDROXY: 39.5 ng/mL (ref 30.0–100.0)

## 2017-09-11 ENCOUNTER — Other Ambulatory Visit: Payer: Self-pay | Admitting: *Deleted

## 2017-09-11 ENCOUNTER — Encounter: Payer: Self-pay | Admitting: Family Medicine

## 2017-09-11 MED ORDER — INSULIN PEN NEEDLE 32G X 6 MM MISC: 3 refills | Status: DC

## 2017-10-09 DIAGNOSIS — F0280 Dementia in other diseases classified elsewhere without behavioral disturbance: Secondary | ICD-10-CM | POA: Diagnosis not present

## 2017-10-09 DIAGNOSIS — G301 Alzheimer's disease with late onset: Secondary | ICD-10-CM | POA: Diagnosis not present

## 2017-11-07 ENCOUNTER — Other Ambulatory Visit: Payer: Self-pay | Admitting: Family Medicine

## 2017-11-07 MED ORDER — LISINOPRIL 5 MG PO TABS: 5.0000 mg | ORAL_TABLET | Freq: Every day | ORAL | 3 refills | Status: DC

## 2017-11-08 MED ORDER — LISINOPRIL 5 MG PO TABS: 5.0000 mg | ORAL_TABLET | Freq: Every day | ORAL | 3 refills | Status: DC

## 2017-11-08 NOTE — Telephone Encounter (Signed)
Resent, pt aware. 

## 2017-11-08 NOTE — Telephone Encounter (Signed)
[name] is calling back because she has not heard anything about yesterdays message

## 2017-12-10 ENCOUNTER — Ambulatory Visit: Payer: Medicare Other | Admitting: Family Medicine

## 2017-12-12 ENCOUNTER — Encounter: Payer: Self-pay | Admitting: Family Medicine

## 2017-12-24 ENCOUNTER — Encounter: Payer: Self-pay | Admitting: Family Medicine

## 2017-12-24 ENCOUNTER — Ambulatory Visit (INDEPENDENT_AMBULATORY_CARE_PROVIDER_SITE_OTHER): Payer: Medicare Other | Admitting: Family Medicine

## 2017-12-24 VITALS — BP 88/51 | HR 73 | Temp 98.0°F | Ht 72.0 in | Wt 186.0 lb

## 2017-12-24 DIAGNOSIS — E782 Mixed hyperlipidemia: Secondary | ICD-10-CM | POA: Diagnosis not present

## 2017-12-24 DIAGNOSIS — E118 Type 2 diabetes mellitus with unspecified complications: Secondary | ICD-10-CM

## 2017-12-24 DIAGNOSIS — F09 Unspecified mental disorder due to known physiological condition: Secondary | ICD-10-CM | POA: Diagnosis not present

## 2017-12-24 DIAGNOSIS — E1165 Type 2 diabetes mellitus with hyperglycemia: Secondary | ICD-10-CM

## 2017-12-24 DIAGNOSIS — R809 Proteinuria, unspecified: Secondary | ICD-10-CM

## 2017-12-24 DIAGNOSIS — E1129 Type 2 diabetes mellitus with other diabetic kidney complication: Secondary | ICD-10-CM | POA: Diagnosis not present

## 2017-12-24 DIAGNOSIS — Z794 Long term (current) use of insulin: Secondary | ICD-10-CM

## 2017-12-24 DIAGNOSIS — IMO0002 Reserved for concepts with insufficient information to code with codable children: Secondary | ICD-10-CM

## 2017-12-24 DIAGNOSIS — I10 Essential (primary) hypertension: Secondary | ICD-10-CM | POA: Diagnosis not present

## 2017-12-24 DIAGNOSIS — R278 Other lack of coordination: Secondary | ICD-10-CM | POA: Diagnosis not present

## 2017-12-24 LAB — BAYER DCA HB A1C WAIVED: HB A1C (BAYER DCA - WAIVED): 7.5 % — ABNORMAL HIGH (ref <7.0)

## 2017-12-24 MED ORDER — MEMANTINE HCL ER 7 & 14 & 21 &28 MG PO CP24: ORAL_CAPSULE | ORAL | 0 refills | Status: DC

## 2017-12-24 MED ORDER — METFORMIN HCL 1000 MG PO TABS: 1000.0000 mg | ORAL_TABLET | Freq: Two times a day (BID) | ORAL | 3 refills | Status: DC

## 2017-12-24 NOTE — Progress Notes (Signed)
Subjective:  Patient ID: Jon [name], male    DOB: 08-22-1945  Age: 72 y.o. MRN: [id number]  CC: Medical Management of Chronic Issues   HPI Jon [name] presents for  follow-up of hypertension. Patient has no history of headache chest pain or shortness of breath or recent cough. Patient also denies symptoms of TIA such as focal numbness or weakness. Patient denies side effects from medication. States taking it regularly. Follow-up of diabetes. Patient does not check blood sugar at home regularly.  A couple of times he is checking fasting and is been in the 130-140 range.   Patient denies symptoms such as polyuria, polydipsia, excessive hunger, nausea No significant hypoglycemic spells noted. Medications as noted below. Taking them regularly without complication/adverse reaction being reported today. Jon [name] is very concerned about his lack of balance.  Because of this he spends all of his time sitting in a chair.  She thinks he should be more mobile.  Patient says that he does not balance well and he sits in the chair because he is afraid of losing his balance.  He also says there is no where he really wants to go.  Jon [name] is concerned that he is forgetful and does not think clearly.  He gets very angry when she makes him be active.  For instance if she does not bring her his meals to him in his chair she will get cussed out.   History [name] has a past medical history of AAA (abdominal aortic aneurysm) ([place]), Anemia, Ataxia, Diabetes mellitus without complication ([place]), History of gallstones, Hyperlipidemia, Hypertension, and Vitamin D deficiency.   He has a past surgical history that includes ERCP; Inguinal hernia repair; and Tonsillectomy.   His family history includes Diabetes in his brother and mother; Hip fracture in his mother; Hyperlipidemia in his brother; Hypertension in his brother; Lupus in his sister; Pulmonary embolism in his mother; Scleroderma in his sister.He reports that he quit  smoking about 8 years ago. His smoking use included cigarettes. He started smoking about 51 years ago. He has a 40.00 pack-year smoking history. He has never used smokeless tobacco. He reports that he does not drink alcohol or use drugs.  Current Outpatient Medications on File Prior to Visit  Medication Sig Dispense Refill  . Acetaminophen (TYLENOL ARTHRITIS EXT RELIEF PO) Take by mouth.    [name] albuterol (PROVENTIL HFA;VENTOLIN HFA) 108 (90 Base) MCG/ACT inhaler Inhale into the lungs.    [name] aspirin 81 MG tablet Take 81 mg by mouth daily.    . Dulaglutide (TRULICITY) 1.5 ZE/0.9QZ SOPN Inject 1.5 mg into the skin once a week. 13 pen 3  . glucose blood test strip Check fasting and 2 hours after biggest meal 200 each 3  . Insulin Glargine (LANTUS SOLOSTAR) 100 UNIT/ML Solostar Pen INJECT 45 UNITS DAILY 45 mL 1  . Insulin Pen Needle (SURE COMFORT PEN NEEDLES) 32G X 6 MM MISC Use with insulin pen daily 100 each 3  . lisinopril (PRINIVIL,ZESTRIL) 5 MG tablet Take 1 tablet (5 mg total) by mouth daily. 90 tablet 3  . neomycin-polymyxin-hydrocortisone (CORTISPORIN) 3.5-10000-1 OTIC suspension Place 3 drops into the left ear 4 (four) times daily. 10 mL 0  . omeprazole (PRILOSEC) 20 MG capsule Take 1 capsule (20 mg total) by mouth daily. 90 capsule 3  . rivastigmine (EXELON) 4.5 MG capsule Take 1 capsule (4.5 mg total) by mouth 2 (two) times daily. 180 capsule 3  . citalopram (CELEXA) 10 MG tablet Take by  mouth.     No current facility-administered medications on file prior to visit.     ROS Review of Systems  Constitutional: Negative.   HENT: Negative.   Eyes: Negative for visual disturbance.  Respiratory: Negative for cough and shortness of breath.   Cardiovascular: Negative for chest pain and leg swelling.  Gastrointestinal: Negative for abdominal pain, diarrhea, nausea and vomiting.  Genitourinary: Negative for difficulty urinating.  Musculoskeletal: Negative for arthralgias and myalgias.  Skin:  Negative for rash.  Neurological: Negative for headaches.  Psychiatric/Behavioral: Negative for sleep disturbance.    Objective:  BP (!) 88/51   Pulse 73   Temp 98 F (36.7 C) (Oral)   Ht 6' (1.829 m)   Wt 186 lb (84.4 kg)   BMI 25.23 kg/m   BP Readings from Last 3 Encounters:  12/24/17 (!) 88/51  09/07/17 109/68  06/08/17 101/62    Wt Readings from Last 3 Encounters:  12/24/17 186 lb (84.4 kg)  09/07/17 187 lb 6 oz (85 kg)  03/26/17 182 lb (82.6 kg)     Physical Exam  Constitutional: He is oriented to person, place, and time. He appears well-developed and well-nourished. No distress.  HENT:  Head: Normocephalic and atraumatic.  Right Ear: External ear normal.  Left Ear: External ear normal.  Nose: Nose normal.  Mouth/Throat: Oropharynx is clear and moist.  Eyes: Pupils are equal, round, and reactive to light. Conjunctivae and EOM are normal.  Neck: Normal range of motion. Neck supple. No thyromegaly present.  Cardiovascular: Normal rate, regular rhythm and normal heart sounds.  No murmur heard. Pulmonary/Chest: Effort normal and breath sounds normal. No respiratory distress. He has no wheezes. He has no rales.  Abdominal: Soft. Bowel sounds are normal. He exhibits no distension. There is no tenderness.  Lymphadenopathy:    He has no cervical adenopathy.  Neurological: He is alert and oriented to person, place, and time. He has normal reflexes.  Skin: Skin is warm and dry.  Psychiatric: He has a normal mood and affect. His behavior is normal. Thought content normal. Cognition and memory are impaired. He expresses impulsivity. He exhibits abnormal recent memory.    The patient is quite affable but his general conversation indicates some obfuscation and memory deficits.  Assessment & Plan:   Jon [name] was seen today for medical management of chronic issues.  Diagnoses and all orders for this visit:  Mild cognitive disorder  Uncontrolled type 2 diabetes mellitus  with complication, with long-term current use of insulin (HCC) -     CMP14+EGFR -     Microalbumin / creatinine urine ratio -     Bayer DCA Hb A1c Waived  Mixed hyperlipidemia  Essential hypertension  Microalbuminuria due to type 2 diabetes mellitus (HCC) -     Microalbumin / creatinine urine ratio  Muscular incoordination -     Ambulatory referral to Physical Therapy  Other orders -     metFORMIN (GLUCOPHAGE) 1000 MG tablet; Take 1 tablet (1,000 mg total) by mouth 2 (two) times daily with a meal. -     Memantine HCl ER (NAMENDA XR TITRATION PACK) 7 & 14 & 21 &28 MG CP24; Take 7 mg by mouth daily for 7 days, THEN 14 mg daily for 7 days, THEN 21 mg daily for 7 days, THEN 28 mg daily for 7 days.   Allergies as of 12/24/2017      Reactions   Donepezil Nausea And Vomiting      Medication List  Accurate as of 12/24/17  6:01 PM. Always use your most recent med list.          albuterol 108 (90 Base) MCG/ACT inhaler Commonly known as:  PROVENTIL HFA;VENTOLIN HFA Inhale into the lungs.   aspirin 81 MG tablet Take 81 mg by mouth daily.   CELEXA 10 MG tablet Generic drug:  citalopram Take by mouth.   Dulaglutide 1.5 MG/0.5ML Sopn Commonly known as:  TRULICITY Inject 1.5 mg into the skin once a week.   glucose blood test strip Check fasting and 2 hours after biggest meal   Insulin Glargine 100 UNIT/ML Solostar Pen Commonly known as:  LANTUS SOLOSTAR INJECT 45 UNITS DAILY   Insulin Pen Needle 32G X 6 MM Misc Commonly known as:  SURE COMFORT PEN NEEDLES Use with insulin pen daily   lisinopril 5 MG tablet Commonly known as:  PRINIVIL,ZESTRIL Take 1 tablet (5 mg total) by mouth daily.   Memantine HCl ER 7 & 14 & 21 &28 MG Cp24 Commonly known as:  NAMENDA XR TITRATION PACK Take 7 mg by mouth daily for 7 days, THEN 14 mg daily for 7 days, THEN 21 mg daily for 7 days, THEN 28 mg daily for 7 days. Start taking on:  12/24/2017   metFORMIN 1000 MG tablet Commonly  known as:  GLUCOPHAGE Take 1 tablet (1,000 mg total) by mouth 2 (two) times daily with a meal.   neomycin-polymyxin-hydrocortisone 3.5-10000-1 OTIC suspension Commonly known as:  CORTISPORIN Place 3 drops into the left ear 4 (four) times daily.   omeprazole 20 MG capsule Commonly known as:  PRILOSEC Take 1 capsule (20 mg total) by mouth daily.   rivastigmine 4.5 MG capsule Commonly known as:  EXELON Take 1 capsule (4.5 mg total) by mouth 2 (two) times daily.   TYLENOL ARTHRITIS EXT RELIEF PO Take by mouth.       Meds ordered this encounter  Medications  . metFORMIN (GLUCOPHAGE) 1000 MG tablet    Sig: Take 1 tablet (1,000 mg total) by mouth 2 (two) times daily with a meal.    Dispense:  180 tablet    Refill:  3  . Memantine HCl ER (NAMENDA XR TITRATION PACK) 7 & 14 & 21 &28 MG CP24    Sig: Take 7 mg by mouth daily for 7 days, THEN 14 mg daily for 7 days, THEN 21 mg daily for 7 days, THEN 28 mg daily for 7 days.    Dispense:  70 capsule    Refill:  0      Follow-up: No follow-ups on file.  Claretta Fraise, M.D.

## 2017-12-24 NOTE — Patient Instructions (Signed)

## 2017-12-25 LAB — CMP14+EGFR
ALBUMIN: 3.8 g/dL (ref 3.5–4.8)
ALK PHOS: 115 IU/L (ref 39–117)
ALT: 57 IU/L — ABNORMAL HIGH (ref 0–44)
AST: 51 IU/L — ABNORMAL HIGH (ref 0–40)
Albumin/Globulin Ratio: 1.2 (ref 1.2–2.2)
BILIRUBIN TOTAL: 0.4 mg/dL (ref 0.0–1.2)
BUN / CREAT RATIO: 19 (ref 10–24)
BUN: 15 mg/dL (ref 8–27)
CHLORIDE: 104 mmol/L (ref 96–106)
CO2: 20 mmol/L (ref 20–29)
CREATININE: 0.8 mg/dL (ref 0.76–1.27)
Calcium: 9.4 mg/dL (ref 8.6–10.2)
GFR calc Af Amer: 105 mL/min/{1.73_m2} (ref >59)
GFR calc non Af Amer: 91 mL/min/{1.73_m2} (ref >59)
GLOBULIN, TOTAL: 3.3 g/dL (ref 1.5–4.5)
Glucose: 187 mg/dL — ABNORMAL HIGH (ref 65–99)
POTASSIUM: 4.5 mmol/L (ref 3.5–5.2)
SODIUM: 138 mmol/L (ref 134–144)
Total Protein: 7.1 g/dL (ref 6.0–8.5)

## 2017-12-25 LAB — MICROALBUMIN / CREATININE URINE RATIO
CREATININE, UR: 79.6 mg/dL
MICROALBUM., U, RANDOM: 26.4 ug/mL
Microalb/Creat Ratio: 33.2 mg/g creat — ABNORMAL HIGH (ref 0.0–30.0)

## 2018-01-01 ENCOUNTER — Ambulatory Visit: Payer: Medicare Other | Attending: Family Medicine | Admitting: Physical Therapy

## 2018-01-01 ENCOUNTER — Other Ambulatory Visit: Payer: Self-pay

## 2018-01-01 ENCOUNTER — Encounter: Payer: Self-pay | Admitting: Physical Therapy

## 2018-01-01 DIAGNOSIS — R2681 Unsteadiness on feet: Secondary | ICD-10-CM | POA: Diagnosis not present

## 2018-01-01 NOTE — Therapy (Signed)
[facility] [address] [place], [place], [address] Phone: [phone]   Fax:  [phone]  Physical Therapy Evaluation  Patient Details  Name: Jon [name] MRN: [id number] Date of Birth: [birth year] Referring Provider: Mechele Claude MD   Encounter Date: 01/01/2018  PT End of Session - 01/01/18 1835    Visit Number  1    Number of Visits  16    Date for PT Re-Evaluation  04/01/18    PT Start Time  0230    PT Stop Time  0307    PT Time Calculation (min)  37 min    Equipment Utilized During Treatment  Gait belt    Activity Tolerance  Patient tolerated treatment well    Behavior During Therapy  [name] for tasks assessed/performed       Past Medical History:  Diagnosis Date  . AAA (abdominal aortic aneurysm) (HCC)   . Anemia   . Ataxia   . Diabetes mellitus without complication (HCC)   . History of gallstones   . Hyperlipidemia   . Hypertension   . Vitamin D deficiency     Past Surgical History:  Procedure Laterality Date  . ERCP    . INGUINAL HERNIA REPAIR    . TONSILLECTOMY      Vitals:   01/01/18 1843  SpO2: 92%     Subjective Assessment - 01/01/18 1838    Subjective  The patient presents to the clinic today with his wife.  He is using a scooter.  His wife states he sits most of the time.  He has a walker but uses it very little do to "my balance" per patient report.  This has been an ongoing problem for quite sometimes.  He states when he stands he feels like he is going to fall back ward.      Patient is accompained by:  Family member "Wife."    Pertinent History  AAA, DM, late onset Alzheimer's.    Limitations  Walking    How long can you walk comfortably?  Use a scooter most of the time.         [facility] PT Assessment - 01/01/18 0001      Assessment   Medical Diagnosis  Balance problems.    Referring Provider  Mechele Claude MD    Onset Date/Surgical Date  -- Ongoing.      Precautions   Precautions  -- HIGH  FALL RISK.      Restrictions   Weight Bearing Restrictions  No      Balance Screen   Has the patient fallen in the past 6 months  No    Has the patient had a decrease in activity level because of a fear of falling?   Yes    Is the patient reluctant to leave their home because of a fear of falling?   Yes      Home Environment   Living Environment  Private residence      Prior Function   Level of Independence  -- Uses scooter.      Cognition   Overall Cognitive Status  -- Followed commands easily.  Fluid conservation.      Coordination   Gross Motor Movements are Fluid and Coordinated  -- WNL for bilateral LE's.      ROM / Strength   AROM / PROM / Strength  AROM;Strength      AROM   Overall AROM Comments  Normal bilateral AROM assessed in the anti-gravity  plane.  Clonus noted in right ankle.      Strength   Overall Strength Comments  Bilateral hip abduction strength= 4 to 4+/5 but all other major muscle groupd of the LE at bilateral knees and ankles are normal.      Transfers   Comments  Patient able to perform several sit to stand transfers from chair several times using armrests without difficulty.      Ambulation/Gait   Gait Comments  Patient ambulated with gait belt and a FWW.  He admitted to being fearful of falling.  He has a flat-footed shuffled gait pattern and essentially drug his left leg.  When verbally cued, however, he could easily flexion his hip and advance his right LE.                Objective measurements completed on examination: See above findings.                PT Short Term Goals - 01/01/18 1857      PT SHORT TERM GOAL #1   Title  STG's=LTG's.        PT Long Term Goals - 01/01/18 1857      PT LONG TERM GOAL #1   Title  Independent with an HEP.    Time  8    Period  Weeks    Status  New      PT LONG TERM GOAL #2   Title  Patient walk in clinic with FWW with gait belt assist x 150 feet with CGA.    Time  4    Period   Weeks    Status  New      PT LONG TERM GOAL #3   Title  Patient stand x 2 minutes with supervision only with loss of balance.    Time  8    Period  Weeks    Status  New             Plan - 01/01/18 1852    Clinical Impression Statement  The patient presents to OPPT with balance problems.  He leads a very sedenatry lifestyle and is mobile mostly by use of a scooter.  He has a 4-wheeled walker at home but uses it only seldom and requires assistance to ambulate with it.  His gait has multiple deviations but most notably he drags his right LE which is also remarkable for clonus.  He has a great fear of falling as well.  His balance is currently severely limited.  His deficits result in a loss of functional independence and mobility.    History and Personal Factors relevant to plan of care:  AAA, DM, late onset Alzheimer's; fear of falling and balance problems.    Clinical Presentation  Evolving    Clinical Presentation due to:  Worseneing.    Clinical Decision Making  Moderate    Rehab Potential  Fair    Clinical Impairments Affecting Rehab Potential  Severe balance deficit.    PT Frequency  2x / week    PT Duration  8 weeks    PT Treatment/Interventions  Gait training;Functional mobility training;Therapeutic activities;Therapeutic exercise;Balance training;Patient/family education;Neuromuscular re-education    PT Next Visit Plan  Nustep; Gait and balance activites.    Consulted and Agree with Plan of Care  Patient       Patient will benefit from skilled therapeutic intervention in order to improve the following deficits and impairments:  Abnormal gait, Decreased activity tolerance, Decreased balance, Decreased mobility,  Decreased strength, Difficulty walking  Visit Diagnosis: Unsteadiness on feet - Plan: PT plan of care cert/re-cert     Problem List Patient Active Problem List   Diagnosis Date Noted  . Muscular incoordination 03/26/2017  . Abnormal chest CT 01/25/2016  .  Lumbar compression fracture (HCC) 01/25/2016  . Bullous emphysema (HCC) 09/28/2015  . Microalbuminuria due to type 2 diabetes mellitus (HCC) 03/12/2015  . Mild cognitive disorder 05/15/2013  . Vitamin D deficiency   . Hyperlipidemia 03/19/2013  . Uncontrolled type 2 diabetes mellitus with complication, with long-term current use of insulin (HCC) 02/06/2013  . HTN (hypertension) 02/06/2013  . AAA (abdominal aortic aneurysm) without rupture (HCC) 02/06/2013  . Clubbing of fingers 02/06/2013  . Ex-smoker 02/06/2013  . History of unsteady gait 02/06/2013    Binh Doten, [place] MPT 01/01/2018, 7:01 PM  [facility] [address] [place], [place], [address] Phone: [phone]   Fax:  [phone]  Name: Jon [name] MRN: [id number] Date of Birth: 1945-10-20

## 2018-01-04 ENCOUNTER — Telehealth: Payer: Self-pay

## 2018-01-04 NOTE — Telephone Encounter (Signed)
Insurance does not cover XR Namenda  5 and 10 mg tablets are covered

## 2018-01-08 ENCOUNTER — Other Ambulatory Visit: Payer: Self-pay | Admitting: Family Medicine

## 2018-01-08 ENCOUNTER — Encounter: Payer: Self-pay | Admitting: Physical Therapy

## 2018-01-08 ENCOUNTER — Ambulatory Visit: Payer: Medicare Other | Admitting: Physical Therapy

## 2018-01-08 DIAGNOSIS — R2681 Unsteadiness on feet: Secondary | ICD-10-CM

## 2018-01-08 MED ORDER — MEMANTINE HCL 28 X 5 MG & 21 X 10 MG PO TABS: ORAL_TABLET | ORAL | 12 refills | Status: DC

## 2018-01-08 NOTE — Telephone Encounter (Signed)
Wife aware

## 2018-01-08 NOTE — Telephone Encounter (Signed)
I sent in the requested prescription 

## 2018-01-08 NOTE — Therapy (Signed)
[facility] [address] [place], [place], [address] Phone: [phone]   Fax:  [phone]  Physical Therapy Treatment  Patient Details  Name: Jon [name] MRN: [id number] Date of Birth: [birth year] Referring Provider: Mechele ClaudeWarren Stacks MD   Encounter Date: 01/08/2018  PT End of Session - 01/08/18 1424    Visit Number  2    Number of Visits  16    Date for PT Re-Evaluation  04/01/18    PT Start Time  1425    PT Stop Time  1523    PT Time Calculation (min)  58 min    Activity Tolerance  Patient tolerated treatment well    Behavior During Therapy  [facility] for tasks assessed/performed       Past Medical History:  Diagnosis Date  . AAA (abdominal aortic aneurysm) (HCC)   . Anemia   . Ataxia   . Diabetes mellitus without complication (HCC)   . History of gallstones   . Hyperlipidemia   . Hypertension   . Vitamin D deficiency     Past Surgical History:  Procedure Laterality Date  . ERCP    . INGUINAL HERNIA REPAIR    . TONSILLECTOMY      There were no vitals filed for this visit.  Subjective Assessment - 01/08/18 1431    Subjective  Patient reported    Patient is accompained by:  Family member    Pertinent History  AAA, DM, late onset Alzheimer's.    Limitations  Walking    How long can you walk comfortably?  Use a scooter most of the time.    Currently in Pain?  No/denies         [facility] - 01/08/18 0001      Assessment   Medical Diagnosis  Balance problems.                   [facility] Adult PT Treatment/Exercise - 01/08/18 0001      Transfers   Transfers  Sit to Stand;Stand to Sit    Sit to Stand  4: Min assist    Sit to Stand Details  Tactile cues for posture;Tactile cues for placement;Verbal cues for gait pattern;Verbal cues for safe use of DME/AE;Verbal cues for sequencing;Verbal cues for technique;Manual facilitation for weight shifting    Stand to Sit  4: Min assist;Uncontrolled descent       Ambulation/Gait   Ambulation/Gait  Yes    Ambulation/Gait Assistance  4: Min assist    Assistive device  Rolling walker    Gait Pattern  Step-to pattern;Decreased step length - right;Decreased stride length;Decreased hip/knee flexion - right;Decreased dorsiflexion - right;Decreased weight shift to right;Poor foot clearance - right    Gait Comments  Use of gait belt, multiple cuing for step length step height and erect posture      Exercises   Exercises  Knee/Hip      Knee/Hip Exercises: Aerobic   Nustep  Level 3 x10 minutes      Knee/Hip Exercises: Seated   Long Arc Quad  AROM;Strengthening;Both;2 sets;10 reps    Clamshell with TheraBand  Red 2x10    Hamstring Curl  AROM;Both;2 sets;10 reps    Hamstring Limitations  Red TBand    Abduction/Adduction   Strengthening;Both;2 sets;10 reps 2x10 5" hold ball squeeze          Balance Exercises - 01/08/18 1433      Balance Exercises: Standing   Standing Eyes Opened  Solid surface;Time;Narrow  base of support (BOS);Wide (BOA);2 reps 1 minute each    Stepping Strategy  Anterior;UE support;10 reps;Lateral          PT Short Term Goals - 01/01/18 1857      PT SHORT TERM GOAL #1   Title  STG's=LTG's.        PT Long Term Goals - 01/01/18 1857      PT LONG TERM GOAL #1   Title  Independent with an HEP.    Time  8    Period  Weeks    Status  New      PT LONG TERM GOAL #2   Title  Patient walk in clinic with FWW with gait belt assist x 150 feet with CGA.    Time  4    Period  Weeks    Status  New      PT LONG TERM GOAL #3   Title  Patient stand x 2 minutes with supervision only with loss of balance.    Time  8    Period  Weeks    Status  New            Plan - 01/08/18 2044    Clinical Impression Statement  Patient was able to tolerate treatment fairly. Patient required Min/mod assist for rolling walker ambulation with multiple verbal and tactile cuing for sequencing, posture and to improve gait mechanics.  Patient was unable to demonstrate carryover of cuing. Patient continued to demonstrate R foot drag and poor right step length. Patient required multiple cuing to focus on the task at hand but noted with improvements after getting patient focused on task. Patient required multiple cues for sequencing for sit to stands and stand to sit. Patient educated importance of reaching back for chair/scooter to sit for safety and to prevent falls. Patient reported understanding.     Clinical Presentation  Evolving    Clinical Decision Making  Moderate    Rehab Potential  Fair    Clinical Impairments Affecting Rehab Potential  Severe balance deficit.    PT Frequency  2x / week    PT Duration  8 weeks    PT Treatment/Interventions  Gait training;Functional mobility training;Therapeutic activities;Therapeutic exercise;Balance training;Patient/family education;Neuromuscular re-education    PT Next Visit Plan  Nustep; Gait and balance activites. Stepping patterns    Consulted and Agree with Plan of Care  Patient       Patient will benefit from skilled therapeutic intervention in order to improve the following deficits and impairments:  Abnormal gait, Decreased activity tolerance, Decreased balance, Decreased mobility, Decreased strength, Difficulty walking  Visit Diagnosis: Unsteadiness on feet     Problem List Patient Active Problem List   Diagnosis Date Noted  . Muscular incoordination 03/26/2017  . Abnormal chest CT 01/25/2016  . Lumbar compression fracture (HCC) 01/25/2016  . Bullous emphysema (HCC) 09/28/2015  . Microalbuminuria due to type 2 diabetes mellitus (HCC) 03/12/2015  . Mild cognitive disorder 05/15/2013  . Vitamin D deficiency   . Hyperlipidemia 03/19/2013  . Uncontrolled type 2 diabetes mellitus with complication, with long-term current use of insulin (HCC) 02/06/2013  . HTN (hypertension) 02/06/2013  . AAA (abdominal aortic aneurysm) without rupture (HCC) 02/06/2013  . Clubbing of  fingers 02/06/2013  . Ex-smoker 02/06/2013  . History of unsteady gait 02/06/2013    Guss Bunde, PT, DPT 01/08/2018, 9:08 PM  [facility] [address] [place], [place], [address] Phone: [phone]   Fax:  [phone]  Name:  Jon [name] MRN: [id number] Date of Birth: 1945-11-05

## 2018-01-10 ENCOUNTER — Ambulatory Visit: Payer: Medicare Other | Admitting: Physical Therapy

## 2018-01-10 DIAGNOSIS — R2681 Unsteadiness on feet: Secondary | ICD-10-CM

## 2018-01-10 NOTE — Therapy (Signed)
[facility] [address] [place], [place], [address] Phone: [phone]   Fax:  [phone]  Physical Therapy Treatment  Patient Details  Name: Jon [name] MRN: [id number] Date of Birth: 1945/09/25 Referring Provider: Mechele Claude MD   Encounter Date: 01/10/2018  PT End of Session - 01/10/18 1538    Visit Number  3    Number of Visits  16    Date for PT Re-Evaluation  04/01/18    PT Start Time  1423    PT Stop Time  1523    PT Time Calculation (min)  60 min    Equipment Utilized During Treatment  Gait belt    Activity Tolerance  Patient tolerated treatment well    Behavior During Therapy  [facility] for tasks assessed/performed       Past Medical History:  Diagnosis Date  . AAA (abdominal aortic aneurysm) (HCC)   . Anemia   . Ataxia   . Diabetes mellitus without complication (HCC)   . History of gallstones   . Hyperlipidemia   . Hypertension   . Vitamin D deficiency     Past Surgical History:  Procedure Laterality Date  . ERCP    . INGUINAL HERNIA REPAIR    . TONSILLECTOMY      There were no vitals filed for this visit.  Subjective Assessment - 01/10/18 1433    Subjective  Patient reported being compliant with HEP however patient's wife reported he has not been complaint with HEP.    Patient is accompained by:  Family member    Pertinent History  AAA, DM, late onset Alzheimer's.    Limitations  Walking    How long can you walk comfortably?  Use a scooter most of the time.    Currently in Pain?  No/denies         [facility] PT Assessment - 01/10/18 0001      Assessment   Medical Diagnosis  Balance problems.                   [facility] Adult PT Treatment/Exercise - 01/10/18 0001      Transfers   Transfers  Sit to Stand;Stand to Sit    Sit to Stand  4: Min assist    Sit to Stand Details  Tactile cues for posture;Tactile cues for placement;Verbal cues for gait pattern;Verbal cues for safe use of DME/AE;Verbal  cues for sequencing;Verbal cues for technique;Manual facilitation for weight shifting    Stand to Sit  4: Min assist;Uncontrolled descent      Ambulation/Gait   Ambulation/Gait  Yes    Ambulation/Gait Assistance  4: Min guard    Assistive device  Rolling walker    Gait Comments  Use of gait belt, multiple cuing for step length step height and erect posture      Exercises   Exercises  Knee/Hip      Knee/Hip Exercises: Aerobic   Nustep  Level 3 x12 minutes; monitoring 65+ steps/minute      Knee/Hip Exercises: Seated   Long Arc Quad  AROM;Strengthening;Both;2 sets;10 reps;Weights    Long Arc Quad Weight  1 lbs.    Clamshell with TheraBand  --    Marching  Strengthening;Both;Other (comment) 2 minutes    Sit to Sand  2 sets;5 reps          Balance Exercises - 01/10/18 1437      Balance Exercises: Standing   Standing Eyes Opened  Solid surface;Time;Narrow base of support (BOS);Wide (  BOA);2 reps 2 mins x2    Stepping Strategy  Anterior;UE support;10 reps      Balance Exercises: Seated   Dynamic Sitting  Eyes opened;Lower extremity support - 2;Upper extremity support - 1;Anterior/posterior weight shift;Lateral weight shift;Reaching outside base of support 30  reaches    Other Seated Exercises  seated crunches x20          PT Short Term Goals - 01/01/18 1857      PT SHORT TERM GOAL #1   Title  STG's=LTG's.        PT Long Term Goals - 01/01/18 1857      PT LONG TERM GOAL #1   Title  Independent with an HEP.    Time  8    Period  Weeks    Status  New      PT LONG TERM GOAL #2   Title  Patient walk in clinic with FWW with gait belt assist x 150 feet with CGA.    Time  4    Period  Weeks    Status  New      PT LONG TERM GOAL #3   Title  Patient stand x 2 minutes with supervision only with loss of balance.    Time  8    Period  Weeks    Status  New            Plan - 01/10/18 1539    Clinical Impression Statement  Patient was able to tolerate treatment  fairly. Patient noted with muscle fatigue as noted by muscle fasiculations with seated LAQ and marches. Patient required tactile cue to for full ROM of LAQ and adequate march height. Patient required multiple cues for focusing on the task. Patient demonstrated poor carryover of cues for sequencing to sit<>stand as well as gait pattern with walker despite repeating sequence cue. Patient still attempts to pull up to standing from walker rather than push up from chair. Patient provided with chair sit ups as well as seated marching to perform as HEP. Patient and wife reported understanding.     Clinical Presentation  Evolving    Clinical Decision Making  Moderate    Rehab Potential  Fair    Clinical Impairments Affecting Rehab Potential  Severe balance deficit.    PT Frequency  2x / week    PT Duration  8 weeks    PT Treatment/Interventions  Gait training;Functional mobility training;Therapeutic activities;Therapeutic exercise;Balance training;Patient/family education;Neuromuscular re-education    PT Next Visit Plan  Nustep; Gait and balance activites. Stepping patterns    PT Home Exercise Plan  chair sit ups, seated marching    Consulted and Agree with Plan of Care  Patient       Patient will benefit from skilled therapeutic intervention in order to improve the following deficits and impairments:  Abnormal gait, Decreased activity tolerance, Decreased balance, Decreased mobility, Decreased strength, Difficulty walking  Visit Diagnosis: Unsteadiness on feet     Problem List Patient Active Problem List   Diagnosis Date Noted  . Muscular incoordination 03/26/2017  . Abnormal chest CT 01/25/2016  . Lumbar compression fracture (HCC) 01/25/2016  . Bullous emphysema (HCC) 09/28/2015  . Microalbuminuria due to type 2 diabetes mellitus (HCC) 03/12/2015  . Mild cognitive disorder 05/15/2013  . Vitamin D deficiency   . Hyperlipidemia 03/19/2013  . Uncontrolled type 2 diabetes mellitus with  complication, with long-term current use of insulin (HCC) 02/06/2013  . HTN (hypertension) 02/06/2013  . AAA (abdominal aortic aneurysm) without  rupture (HCC) 02/06/2013  . Clubbing of fingers 02/06/2013  . Ex-smoker 02/06/2013  . History of unsteady gait 02/06/2013   Guss Bunde, PT, DPT 01/10/2018, 3:46 PM  [facility] [address] [place], [place], [address] Phone: [phone]   Fax:  [phone]  Name: Jon [name] MRN: [id number] Date of Birth: [birth year]

## 2018-01-15 ENCOUNTER — Encounter: Payer: Self-pay | Admitting: Physical Therapy

## 2018-01-15 ENCOUNTER — Ambulatory Visit: Payer: Medicare Other | Admitting: Physical Therapy

## 2018-01-15 DIAGNOSIS — R2681 Unsteadiness on feet: Secondary | ICD-10-CM | POA: Diagnosis not present

## 2018-01-15 NOTE — Therapy (Signed)
[facility] [address] [place], [place], [address] Phone: [phone]   Fax:  [phone]  Physical Therapy Treatment  Patient Details  Name: Jon [name] L Fredrick MRN: [id number] Date of Birth: [birth year] Referring Provider: Mechele ClaudeWarren Stacks MD   Encounter Date: 01/15/2018  PT End of Session - 01/15/18 1400    Visit Number  4    Number of Visits  16    Date for PT Re-Evaluation  04/01/18    PT Start Time  1345    PT Stop Time  1438    PT Time Calculation (min)  53 min    Equipment Utilized During Treatment  Gait belt;Other (comment) Rollator    Activity Tolerance  Patient tolerated treatment well    Behavior During Therapy  WFL for tasks assessed/performed       Past Medical History:  Diagnosis Date  . AAA (abdominal aortic aneurysm) (HCC)   . Anemia   . Ataxia   . Diabetes mellitus without complication (HCC)   . History of gallstones   . Hyperlipidemia   . Hypertension   . Vitamin D deficiency     Past Surgical History:  Procedure Laterality Date  . ERCP    . INGUINAL HERNIA REPAIR    . TONSILLECTOMY      There were no vitals filed for this visit.  Subjective Assessment - 01/15/18 1401    Subjective  Patient reported doing okay. Patient stated he performed all he could remember with HEP but wife reported he did not perform any exercises over the weekend.     Patient is accompained by:  Family member    Pertinent History  AAA, DM, late onset Alzheimer's.    Limitations  Walking    How long can you walk comfortably?  Use a scooter most of the time.    Currently in Pain?  No/denies         [facility] - 01/15/18 0001      Assessment   Medical Diagnosis  Balance problems.                   [facility] Adult PT Treatment/Exercise - 01/15/18 0001      Transfers   Transfers  Sit to Stand;Stand to Sit    Sit to Stand  4: Min assist    Sit to Stand Details  Tactile cues for sequencing;Tactile cues for  posture;Tactile cues for placement;Tactile cues for weight beaing;Verbal cues for precautions/safety;Verbal cues for technique;Verbal cues for sequencing    Stand to Sit  4: Min assist;Uncontrolled descent    Stand to Sit Details (indicate cue type and reason)  Tactile cues for sequencing;Tactile cues for placement;Verbal cues for gait pattern;Verbal cues for precautions/safety;Verbal cues for technique    Comments  poor carryover of proper technique for safety      Ambulation/Gait   Ambulation/Gait  Yes    Ambulation/Gait Assistance  4: Min assist    Assistive device  Rollator    Gait Pattern  Step-to pattern;Decreased stride length;Decreased step length - left;Decreased hip/knee flexion - right;Decreased dorsiflexion - right;Decreased weight shift to right;Abducted- right;Poor foot clearance - right    Gait Comments  Use of gait belt, multiple cuing for step length step height and erect posture, foot placed in front of R wheels to prevent advancing rollator too far in front of body      Exercises   Exercises  Knee/Hip      Knee/Hip Exercises: Aerobic   Nustep  Level 4 x15 minutes; monitoring 70+ steps/minute      Knee/Hip Exercises: Seated   Long Arc Quad  AROM;Strengthening;Both;2 sets;10 reps;Weights    Long Arc Quad Weight  2 lbs.    Clamshell with TheraBand  Red 2x10    Marching  Strengthening;Both;Other (comment);2 sets;10 reps;Weights    Marching Weights  2 lbs.          Balance Exercises - 01/15/18 1627      Balance Exercises: Standing   Standing Eyes Opened  Wide (BOA);Solid surface;2 reps;Time;Other (comment) 2 mins x2; at walker and at parallel bars    Stepping Strategy  Anterior;UE support;10 reps x20; stepping over ankle weight          PT Short Term Goals - 01/01/18 1857      PT SHORT TERM GOAL #1   Title  STG's=LTG's.        PT Long Term Goals - 01/01/18 1857      PT LONG TERM GOAL #1   Title  Independent with an HEP.    Time  8    Period  Weeks     Status  New      PT LONG TERM GOAL #2   Title  Patient walk in clinic with FWW with gait belt assist x 150 feet with CGA.    Time  4    Period  Weeks    Status  New      PT LONG TERM GOAL #3   Title  Patient stand x 2 minutes with supervision only with loss of balance.    Time  8    Period  Weeks    Status  New            Plan - 01/15/18 1632    Clinical Impression Statement  Patient was able to complete treatment but required multiple cuing to maintain focus on task. Patient required tactile and verbal cuing multiple times for proper technique of all exercises however demonstrated very poor carryover. Repetitions only were counted if they were performed correctly. Patient continues to demonstrate poor breathing technique during exercises despite demonstration and cuing. Patient noted with increased LE fasiculations with standing at rollator. PT required to place foot in front of wheel of rollator to prevent walker from advancing too far. Patient's wife and PT discussed how he will be assessed for a home wheel chair on July 30th. If approved for home wheel chair, therapy will be focused on seated LE strength training, transfers from various surfaces with some standing weightbearing exercises. Patient's wife reported agreement and understanding.     Clinical Presentation  Evolving    Clinical Decision Making  Moderate    Rehab Potential  Fair    Clinical Impairments Affecting Rehab Potential  Severe balance deficit.    PT Frequency  2x / week    PT Duration  8 weeks    PT Treatment/Interventions  Gait training;Functional mobility training;Therapeutic activities;Therapeutic exercise;Balance training;Patient/family education;Neuromuscular re-education    PT Next Visit Plan  Nustep; Gait and balance activites. Stepping patterns    Consulted and Agree with Plan of Care  Patient       Patient will benefit from skilled therapeutic intervention in order to improve the following deficits  and impairments:  Abnormal gait, Decreased activity tolerance, Decreased balance, Decreased mobility, Decreased strength, Difficulty walking  Visit Diagnosis: Unsteadiness on feet     Problem List Patient Active Problem List   Diagnosis Date Noted  . Muscular incoordination 03/26/2017  .  Abnormal chest CT 01/25/2016  . Lumbar compression fracture (HCC) 01/25/2016  . Bullous emphysema (HCC) 09/28/2015  . Microalbuminuria due to type 2 diabetes mellitus (HCC) 03/12/2015  . Mild cognitive disorder 05/15/2013  . Vitamin D deficiency   . Hyperlipidemia 03/19/2013  . Uncontrolled type 2 diabetes mellitus with complication, with long-term current use of insulin (HCC) 02/06/2013  . HTN (hypertension) 02/06/2013  . AAA (abdominal aortic aneurysm) without rupture (HCC) 02/06/2013  . Clubbing of fingers 02/06/2013  . Ex-smoker 02/06/2013  . History of unsteady gait 02/06/2013   Guss Bunde, PT, DPT 01/15/2018, 4:43 PM  [facility] [address] [place], [place], [address] Phone: [phone]   Fax:  [phone]  Name: ERDEM NAAS MRN: [id number] Date of Birth: Jul 08, 1945

## 2018-01-17 ENCOUNTER — Ambulatory Visit: Payer: Medicare Other | Admitting: Physical Therapy

## 2018-01-17 ENCOUNTER — Encounter: Payer: Self-pay | Admitting: Physical Therapy

## 2018-01-17 VITALS — HR 76

## 2018-01-17 DIAGNOSIS — R2681 Unsteadiness on feet: Secondary | ICD-10-CM

## 2018-01-17 NOTE — Therapy (Signed)
[facility] [address] [place], [place], [address] Phone: [phone]   Fax:  [phone]  Physical Therapy Treatment  Patient Details  Name: Jon [name] MRN: [id number] Date of Birth: 07/18/1945 Referring Provider: Mechele ClaudeWarren Stacks MD   Encounter Date: 01/17/2018  PT End of Session - 01/17/18 1503    Visit Number  5    Number of Visits  16    Date for PT Re-Evaluation  04/01/18    PT Start Time  1345    PT Stop Time  1436    PT Time Calculation (min)  51 min    Equipment Utilized During Treatment  Gait belt rolling walker    Activity Tolerance  Patient tolerated treatment well    Behavior During Therapy  WFL for tasks assessed/performed       Past Medical History:  Diagnosis Date  . AAA (abdominal aortic aneurysm) (HCC)   . Anemia   . Ataxia   . Diabetes mellitus without complication (HCC)   . History of gallstones   . Hyperlipidemia   . Hypertension   . Vitamin D deficiency     Past Surgical History:  Procedure Laterality Date  . ERCP    . INGUINAL HERNIA REPAIR    . TONSILLECTOMY      Vitals:   01/17/18 1517  Pulse: 76  SpO2: 92%    Subjective Assessment - 01/17/18 1500    Subjective  Patient reported he feels like he's making improvements. Patient only exercises he performs are kicks but wife reports he does not do any home exercises.    Patient is accompained by:  Family member    Pertinent History  AAA, DM, late onset Alzheimer's.    Limitations  Walking    How long can you walk comfortably?  Use a scooter most of the time.    Currently in Pain?  No/denies         [facility] - 01/17/18 0001      Assessment   Medical Diagnosis  Balance problems.                   [facility] Adult PT Treatment/Exercise - 01/17/18 0001      Transfers   Transfers  Sit to Stand;Stand to Sit    Sit to Stand  4: Min assist    Sit to Stand Details  Tactile cues for sequencing;Tactile cues for  posture;Tactile cues for placement;Tactile cues for weight beaing;Verbal cues for precautions/safety;Verbal cues for technique;Verbal cues for sequencing    Stand to Sit  4: Min assist;Uncontrolled descent    Stand to Sit Details (indicate cue type and reason)  Verbal cues for sequencing;Tactile cues for sequencing;Tactile cues for posture;Visual cues/gestures for sequencing;Verbal cues for precautions/safety;Verbal cues for gait pattern    Comments  improved carryover of technique      Ambulation/Gait   Ambulation/Gait  Yes    Ambulation/Gait Assistance  4: Min guard    Assistive device  Rolling walker    Gait Pattern  Step-to pattern;Decreased stride length;Decreased step length - left;Decreased hip/knee flexion - right;Decreased dorsiflexion - right;Decreased weight shift to right;Abducted- right;Poor foot clearance - right    Gait Comments  Use of gait belt, multiple cuing for step length step height and erect posture, foot placed in front of R wheels to prevent advancing rollator too far in front of body      Exercises   Exercises  Knee/Hip      Knee/Hip Exercises:  Aerobic   Nustep  Level 5 x17 minutes; monitoring 70+ steps/minute      Knee/Hip Exercises: Seated   Clamshell with TheraBand  Red 3 minutes    Marching  Strengthening;Both;Other (comment);Weights 2 minutes    Marching Limitations  heel taps on 4 inch step cuing for marching height    Marching Weights  2 lbs.    Hamstring Curl  AROM;Both;2 sets;10 reps    Hamstring Limitations  Red TBand    Abduction/Adduction   Strengthening;Both;2 sets;10 reps    Abd/Adduction Limitations  abduction with 2# weight and stepping onto 4" step    Sit to Sand  5 reps;with UE support cuing and Min A                PT Short Term Goals - 01/01/18 1857      PT SHORT TERM GOAL #1   Title  STG's=LTG's.        PT Long Term Goals - 01/01/18 1857      PT LONG TERM GOAL #1   Title  Independent with an HEP.    Time  8    Period   Weeks    Status  New      PT LONG TERM GOAL #2   Title  Patient walk in clinic with FWW with gait belt assist x 150 feet with CGA.    Time  4    Period  Weeks    Status  New      PT LONG TERM GOAL #3   Title  Patient stand x 2 minutes with supervision only with loss of balance.    Time  8    Period  Weeks    Status  New            Plan - 01/17/18 1504    Clinical Impression Statement  Patient was able to complete treatment with multiple cues and min A. Patient and PT reviewed pursed lip breathing as patient continues to hold breath during exercises. Patient unable to perform proper breathing techinque even with verbal cuing and demonstration. Patient noted with improved carryover of hand placement when coming from sit to standing but still requires cuing to align body fully with chair and reach back for arm rests to come from stand to sit. Patient reported feeling dizzy; patient provided with water and vitals assessed.  92% SaO2, and 78 BPM; SaO2 improved after pursed lip breathing to 96%. Patient still requires verbal and tactile cuing to stay close to walker as well as PT foot placement at walker leg to prevent advancing walker too far. At end of session, noted with good right hip flexion AROM when lifting RT leg into motorize scooter, but did not demonstrate the same AROM to clear a 4" step without cuing.    Clinical Presentation  Evolving    Clinical Decision Making  Moderate    Rehab Potential  Fair    Clinical Impairments Affecting Rehab Potential  Severe balance deficit.    PT Frequency  2x / week    PT Duration  8 weeks    PT Treatment/Interventions  Gait training;Functional mobility training;Therapeutic activities;Therapeutic exercise;Balance training;Patient/family education;Neuromuscular re-education    PT Next Visit Plan  Nustep; Gait and balance activites. Stepping patterns; attempt supine exercises and ther acts for transfers and bed mobility    Consulted and Agree with  Plan of Care  Patient       Patient will benefit from skilled therapeutic intervention in order to  improve the following deficits and impairments:  Abnormal gait, Decreased activity tolerance, Decreased balance, Decreased mobility, Decreased strength, Difficulty walking  Visit Diagnosis: Unsteadiness on feet     Problem List Patient Active Problem List   Diagnosis Date Noted  . Muscular incoordination 03/26/2017  . Abnormal chest CT 01/25/2016  . Lumbar compression fracture (HCC) 01/25/2016  . Bullous emphysema (HCC) 09/28/2015  . Microalbuminuria due to type 2 diabetes mellitus (HCC) 03/12/2015  . Mild cognitive disorder 05/15/2013  . Vitamin D deficiency   . Hyperlipidemia 03/19/2013  . Uncontrolled type 2 diabetes mellitus with complication, with long-term current use of insulin (HCC) 02/06/2013  . HTN (hypertension) 02/06/2013  . AAA (abdominal aortic aneurysm) without rupture (HCC) 02/06/2013  . Clubbing of fingers 02/06/2013  . Ex-smoker 02/06/2013  . History of unsteady gait 02/06/2013   Guss Bunde, PT, DPT 01/17/2018, 3:21 PM  [facility] [address] [place], [place], [address] Phone: [phone]   Fax:  [phone]  Name: Jon [name] MRN: [id number] Date of Birth: 09-05-1945

## 2018-01-22 ENCOUNTER — Encounter: Payer: Medicare Other | Admitting: Physical Therapy

## 2018-01-23 ENCOUNTER — Ambulatory Visit: Payer: Medicare Other | Admitting: Family Medicine

## 2018-01-24 ENCOUNTER — Ambulatory Visit: Payer: Medicare Other | Admitting: Physical Therapy

## 2018-01-24 DIAGNOSIS — R2681 Unsteadiness on feet: Secondary | ICD-10-CM | POA: Diagnosis not present

## 2018-01-24 NOTE — Therapy (Signed)
[facility] [address] [place], [place], [address] Phone: [phone]   Fax:  [phone]  Physical Therapy Treatment  Patient Details  Name: Jon [name] MRN: [id number] Date of Birth: [birth year] Referring Provider: Mechele Claude MD   Encounter Date: 01/24/2018  PT End of Session - 01/24/18 1352    Visit Number  6    Number of Visits  16    Date for PT Re-Evaluation  04/01/18    PT Start Time  1346    PT Stop Time  1430    PT Time Calculation (min)  44 min    Equipment Utilized During Treatment  Gait belt    Activity Tolerance  Patient tolerated treatment well    Behavior During Therapy  [facility] for tasks assessed/performed       Past Medical History:  Diagnosis Date  . AAA (abdominal aortic aneurysm) (HCC)   . Anemia   . Ataxia   . Diabetes mellitus without complication (HCC)   . History of gallstones   . Hyperlipidemia   . Hypertension   . Vitamin D deficiency     Past Surgical History:  Procedure Laterality Date  . ERCP    . INGUINAL HERNIA REPAIR    . TONSILLECTOMY      There were no vitals filed for this visit.  Subjective Assessment - 01/24/18 1354    Subjective  Patient reported no new complaints.    Patient is accompained by:  Family member    Pertinent History  AAA, DM, late onset Alzheimer's.    Limitations  Walking    How long can you walk comfortably?  Use a scooter most of the time.    Currently in Pain?  No/denies         [facility] PT Assessment - 01/24/18 0001      Assessment   Medical Diagnosis  Balance problems.                   [facility] Adult PT Treatment/Exercise - 01/24/18 0001      Transfers   Transfers  Sit to Stand;Stand to Sit    Sit to Stand  4: Min guard    Sit to Stand Details  Verbal cues for sequencing;Verbal cues for precautions/safety    Stand to Sit  4: Min guard    Stand to Sit Details (indicate cue type and reason)  Tactile cues for weight beaing;Verbal cues for  sequencing;Verbal cues for gait pattern;Verbal cues for precautions/safety    Comments  improved carryover of technique but still requires intermittent cuing for safety      Ambulation/Gait   Ambulation/Gait  Yes    Ambulation/Gait Assistance  4: Min guard;4: Min assist    Assistive device  Rolling walker    Gait Pattern  Step-to pattern;Decreased stride length;Decreased step length - left;Decreased hip/knee flexion - right;Decreased dorsiflexion - right;Decreased weight shift to right;Abducted- right;Poor foot clearance - right    Gait Comments  Use of gait belt, multiple cuing for step length step height and erect posture, foot placed in front of R wheels to prevent advancing walker too far in front of body      Exercises   Exercises  Knee/Hip      Knee/Hip Exercises: Aerobic   Nustep  Level 5 x17 minutes; monitoring 70+ steps/minute last minute 90+ steps/minute      Knee/Hip Exercises: Seated   Other Seated Knee/Hip Exercises  rockerboard x2 minutes  Balance Exercises - 01/24/18 1646      Balance Exercises: Standing   Standing Eyes Opened  Wide (BOA);Solid surface;Time;Other (comment);1 rep 2.5 minutes with walker      Balance Exercises: Seated   Other Seated Exercises  seated crunches x2 minutes          PT Short Term Goals - 01/01/18 1857      PT SHORT TERM GOAL #1   Title  STG's=LTG's.        PT Long Term Goals - 01/24/18 1355      PT LONG TERM GOAL #1   Title  Independent with an HEP.    Time  8    Period  Weeks    Status  On-going      PT LONG TERM GOAL #2   Title  Patient walk in clinic with FWW with gait belt assist x 150 feet with CGA.    Time  4    Period  Weeks    Status  On-going      PT LONG TERM GOAL #3   Title  Patient stand x 2 minutes with supervision no with loss of balance.    Time  8    Period  Weeks    Status  On-going            Plan - 01/24/18 1653    Clinical Impression Statement  Patient was able to complete  treatment well. Patient requested to perform two more minutes on nustep. Goals were assessed. All goals are ongoing. Patient educated again the importance of performing HEP to which patient reported understanding. Patient continues to require min assist for amulation with rolling walker to prevent walking from advancing too far. Verbal cues required for proper step height as well as step length. Patient demonstrated proper height and step length 10% of the time. Patient is able to stand at rolling walker for 2 minutes but requires multiple verbal and tactile cuing for upright posture and to extend knees.    Clinical Presentation  Evolving    Clinical Decision Making  Moderate    Rehab Potential  Fair    Clinical Impairments Affecting Rehab Potential  Severe balance deficit.    PT Frequency  2x / week    PT Duration  8 weeks    PT Treatment/Interventions  Gait training;Functional mobility training;Therapeutic activities;Therapeutic exercise;Balance training;Patient/family education;Neuromuscular re-education    PT Next Visit Plan  Nustep; Gait and balance activites. Stepping patterns; attempt supine exercises and ther acts for transfers and bed mobility    Consulted and Agree with Plan of Care  Patient       Patient will benefit from skilled therapeutic intervention in order to improve the following deficits and impairments:  Abnormal gait, Decreased activity tolerance, Decreased balance, Decreased mobility, Decreased strength, Difficulty walking  Visit Diagnosis: Unsteadiness on feet     Problem List Patient Active Problem List   Diagnosis Date Noted  . Muscular incoordination 03/26/2017  . Abnormal chest CT 01/25/2016  . Lumbar compression fracture (HCC) 01/25/2016  . Bullous emphysema (HCC) 09/28/2015  . Microalbuminuria due to type 2 diabetes mellitus (HCC) 03/12/2015  . Mild cognitive disorder 05/15/2013  . Vitamin D deficiency   . Hyperlipidemia 03/19/2013  . Uncontrolled type 2  diabetes mellitus with complication, with long-term current use of insulin (HCC) 02/06/2013  . HTN (hypertension) 02/06/2013  . AAA (abdominal aortic aneurysm) without rupture (HCC) 02/06/2013  . Clubbing of fingers 02/06/2013  . Ex-smoker 02/06/2013  .  History of unsteady gait 02/06/2013   Guss Bunde, PT, DPT 01/24/2018, 5:02 PM  [facility] [address] [place], [place], [address] Phone: [phone]   Fax:  [phone]  Name: RAMSEY GUADAMUZ MRN: [id number] Date of Birth: 10-19-1945

## 2018-01-28 ENCOUNTER — Ambulatory Visit: Payer: Medicare Other | Admitting: Physical Therapy

## 2018-01-28 ENCOUNTER — Encounter: Payer: Self-pay | Admitting: Physical Therapy

## 2018-01-28 DIAGNOSIS — R2681 Unsteadiness on feet: Secondary | ICD-10-CM | POA: Diagnosis not present

## 2018-01-28 NOTE — Therapy (Signed)
[facility] [address] [place], [place], [address] Phone: [phone]   Fax:  [phone]  Physical Therapy Treatment  Patient Details  Name: Jon [name] MRN: [id number] Date of Birth: Jan 15, 1946 Referring Provider: Mechele ClaudeWarren Stacks MD   Encounter Date: 01/28/2018  PT End of Session - 01/28/18 1441    Visit Number  7    Number of Visits  16    Date for PT Re-Evaluation  04/01/18    PT Start Time  1343    PT Stop Time  1431    PT Time Calculation (min)  48 min    Equipment Utilized During Treatment  Gait belt    Activity Tolerance  Patient tolerated treatment well    Behavior During Therapy  [facility] for tasks assessed/performed       Past Medical History:  Diagnosis Date  . AAA (abdominal aortic aneurysm) (HCC)   . Anemia   . Ataxia   . Diabetes mellitus without complication (HCC)   . History of gallstones   . Hyperlipidemia   . Hypertension   . Vitamin D deficiency     Past Surgical History:  Procedure Laterality Date  . ERCP    . INGUINAL HERNIA REPAIR    . TONSILLECTOMY      There were no vitals filed for this visit.  Subjective Assessment - 01/28/18 1442    Subjective  Patient's wife reported he has not been performing any HEP or been performing any walking in his home. Patient wife reports if he does not perform any HEP then she will cancel remaining visits,    Patient is accompained by:  Family member    Pertinent History  AAA, DM, late onset Alzheimer's.    Limitations  Walking    How long can you walk comfortably?  Use a scooter most of the time.    Currently in Pain?  No/denies         [facility] - 01/28/18 0001      Assessment   Medical Diagnosis  Balance problems.                   [facility] Adult PT Treatment/Exercise - 01/28/18 0001      Transfers   Transfers  Sit to Stand;Stand to Sit    Sit to Stand  4: Min assist;3: Mod assist    Sit to Stand Details  Tactile cues for  placement;Tactile cues for weight beaing;Visual cues/gestures for sequencing;Verbal cues for technique;Verbal cues for precautions/safety;Verbal cues for gait pattern    Stand to Sit  3: Mod assist;With upper extremity assist;With armrests;To chair/3-in-1;Uncontrolled descent    Comments  Still constant cues for technique, safety and hand placement. Poor carryover from last week.      Ambulation/Gait   Ambulation/Gait  Yes    Ambulation/Gait Assistance  4: Min assist    Assistive device  Rolling walker    Gait Pattern  Step-to pattern;Decreased stride length;Decreased step length - left;Decreased hip/knee flexion - right;Decreased dorsiflexion - right;Decreased weight shift to right;Abducted- right;Poor foot clearance - right    Ambulation Surface  Level;Indoor    Gait Comments  Use of gait belt, multiple cuing for step length step height and erect posture, foot placed in front of R wheels to prevent advancing walker too far in front of body      Exercises   Exercises  Knee/Hip      Knee/Hip Exercises: Aerobic   Nustep  Level 5 x15 minutes;  monitoring 75+ steps/minute      Knee/Hip Exercises: Seated   Clamshell with TheraBand  Red x3 minutes    Marching  Strengthening;Both;Other (comment);Weights    Marching Limitations  heel taps on 4 inch step cuing for marching height x3 minutes each          Balance Exercises - 01/28/18 1839      Balance Exercises: Standing   Standing Eyes Opened  Wide (BOA);Solid surface;Time;Other (comment);1 rep 3 minutes at walker with cuing from mirror          PT Short Term Goals - 01/01/18 1857      PT SHORT TERM GOAL #1   Title  STG's=LTG's.        PT Long Term Goals - 01/24/18 1355      PT LONG TERM GOAL #1   Title  Independent with an HEP.    Time  8    Period  Weeks    Status  On-going      PT LONG TERM GOAL #2   Title  Patient walk in clinic with FWW with gait belt assist x 150 feet with CGA.    Time  4    Period  Weeks     Status  On-going      PT LONG TERM GOAL #3   Title  Patient stand x 2 minutes with supervision no with loss of balance.    Time  8    Period  Weeks    Status  On-going            Plan - 01/28/18 1840    Clinical Impression Statement  Patient required multiple cues today as patient lost focus during many tasks. Patient required constant verbal and tactile cuing for form and for proper breathing technique. Patient continues to require tactile and verbal cuing for proper hand placement during sit <-> stands. Patient required Min to mod assist for transfers and when patient instructed to push from his legs to stand, he reported, "I can't do that today." Patient educated on importance of LE strength and proper technique to prevent falls during transitional movements. Patient provided with an updated HEP and was instructed to begin walking program at home to improve strength, balance, and mobility. Patient reported agreement and understanding.    Clinical Presentation  Evolving    Clinical Decision Making  Moderate    Rehab Potential  Fair    Clinical Impairments Affecting Rehab Potential  Severe balance deficit.    PT Frequency  2x / week    PT Duration  8 weeks    PT Treatment/Interventions  Gait training;Functional mobility training;Therapeutic activities;Therapeutic exercise;Balance training;Patient/family education;Neuromuscular re-education    PT Next Visit Plan  Nustep; Gait and balance activites. Stepping patterns; attempt supine exercises and ther acts for transfers and bed mobility    Consulted and Agree with Plan of Care  Patient       Patient will benefit from skilled therapeutic intervention in order to improve the following deficits and impairments:  Abnormal gait, Decreased activity tolerance, Decreased balance, Decreased mobility, Decreased strength, Difficulty walking  Visit Diagnosis: Unsteadiness on feet     Problem List Patient Active Problem List   Diagnosis Date  Noted  . Muscular incoordination 03/26/2017  . Abnormal chest CT 01/25/2016  . Lumbar compression fracture (HCC) 01/25/2016  . Bullous emphysema (HCC) 09/28/2015  . Microalbuminuria due to type 2 diabetes mellitus (HCC) 03/12/2015  . Mild cognitive disorder 05/15/2013  . Vitamin D deficiency   .  Hyperlipidemia 03/19/2013  . Uncontrolled type 2 diabetes mellitus with complication, with long-term current use of insulin (HCC) 02/06/2013  . HTN (hypertension) 02/06/2013  . AAA (abdominal aortic aneurysm) without rupture (HCC) 02/06/2013  . Clubbing of fingers 02/06/2013  . Ex-smoker 02/06/2013  . History of unsteady gait 02/06/2013   Guss Bunde, PT, DPT 01/28/2018, 6:53 PM  [facility] [address] [place], [place], [address] Phone: [phone]   Fax:  [phone]  Name: Jon [name] MRN: [id number] Date of Birth: Nov 20, 1945

## 2018-01-31 ENCOUNTER — Ambulatory Visit: Payer: Medicare Other | Admitting: Physical Therapy

## 2018-02-04 ENCOUNTER — Encounter: Payer: Self-pay | Admitting: Physical Therapy

## 2018-02-04 ENCOUNTER — Ambulatory Visit: Payer: Medicare Other | Attending: Family Medicine | Admitting: Physical Therapy

## 2018-02-04 DIAGNOSIS — R2681 Unsteadiness on feet: Secondary | ICD-10-CM | POA: Diagnosis not present

## 2018-02-04 NOTE — Therapy (Signed)
[facility] [address] [place], [place], [address] Phone: [phone]   Fax:  [phone]  Physical Therapy Treatment  Patient Details  Name: Jon [name] MRN: [id number] Date of Birth: [birth year] Referring Provider: Mechele Claude MD   Encounter Date: 02/04/2018  PT End of Session - 02/04/18 1515    Visit Number  8    Number of Visits  16    Date for PT Re-Evaluation  04/01/18    PT Start Time  1427    PT Stop Time  1512    PT Time Calculation (min)  45 min    Equipment Utilized During Treatment  Gait belt    Activity Tolerance  Patient tolerated treatment well    Behavior During Therapy  [facility] for tasks assessed/performed       Past Medical History:  Diagnosis Date  . AAA (abdominal aortic aneurysm) (HCC)   . Anemia   . Ataxia   . Diabetes mellitus without complication (HCC)   . History of gallstones   . Hyperlipidemia   . Hypertension   . Vitamin D deficiency     Past Surgical History:  Procedure Laterality Date  . ERCP    . INGUINAL HERNIA REPAIR    . TONSILLECTOMY      There were no vitals filed for this visit.  Subjective Assessment - 02/04/18 1433    Subjective  Patient arrived with wifeand patient very hesitant with therapy yet willing to try    Patient is accompained by:  Family member wife    Pertinent History  AAA, DM, late onset Alzheimer's.    Limitations  Walking    How long can you walk comfortably?  Use a scooter most of the time.    Currently in Pain?  No/denies                       [facility] Adult PT Treatment/Exercise - 02/04/18 0001      Ambulation/Gait   Ambulation/Gait  Yes    Ambulation/Gait Assistance  4: Min assist    Assistive device  Rolling walker    Gait Pattern  Step-to pattern;Decreased stride length;Decreased step length - left;Decreased hip/knee flexion - right;Decreased dorsiflexion - right;Decreased weight shift to right;Abducted- right;Poor foot clearance - right     Ambulation Surface  Level;Unlevel    Gait Comments  Use of gait belt, multiple cuing for step length step height and erect posture, walked 33 ft, 59ft, 20 ft      Knee/Hip Exercises: Aerobic   Nustep  Level 5 x15 minutes; monitoring 75+ steps/minute          Balance Exercises - 02/04/18 1512      Balance Exercises: Standing   Standing Eyes Opened  Wide (BOA);Solid surface;4 reps;2 reps;10 secs;Limitations CGA and int UE support    Heel Raises Limitations  x15 at sink CGA    Sit to Stand Time  x6    Lift / Chop Limitations  seated chop with ball 2x10 with posture focus          PT Short Term Goals - 01/01/18 1857      PT SHORT TERM GOAL #1   Title  STG's=LTG's.        PT Long Term Goals - 01/24/18 1355      PT LONG TERM GOAL #1   Title  Independent with an HEP.    Time  8    Period  Weeks  Status  On-going      PT LONG TERM GOAL #2   Title  Patient walk in clinic with FWW with gait belt assist x 150 feet with CGA.    Time  4    Period  Weeks    Status  On-going      PT LONG TERM GOAL #3   Title  Patient stand x 2 minutes with supervision no with loss of balance.    Time  8    Period  Weeks    Status  On-going            Plan - 02/04/18 1516    Clinical Impression Statement  Patient tolerated treatment fair. Patient required ongoing educational cues to stay on task. Patient reported he does not do HEP and sits most of the time. Today Focused on educted patient on minimal daily HEP to improve functional independence. Patient able to complete all activities today with encouragement. Patient gets fatigue with walking and some ongoing weakness in right LE. Patient goals ongoing at this time.     Rehab Potential  Fair    Clinical Impairments Affecting Rehab Potential  Severe balance deficit.    PT Frequency  2x / week    PT Duration  8 weeks    PT Treatment/Interventions  Gait training;Functional mobility training;Therapeutic activities;Therapeutic  exercise;Balance training;Patient/family education;Neuromuscular re-education    PT Next Visit Plan  Nustep; Gait and balance activites. Stepping patterns; attempt supine exercises and ther acts for transfers and bed mobility    Consulted and Agree with Plan of Care  Patient       Patient will benefit from skilled therapeutic intervention in order to improve the following deficits and impairments:  Abnormal gait, Decreased activity tolerance, Decreased balance, Decreased mobility, Decreased strength, Difficulty walking  Visit Diagnosis: Unsteadiness on feet     Problem List Patient Active Problem List   Diagnosis Date Noted  . Muscular incoordination 03/26/2017  . Abnormal chest CT 01/25/2016  . Lumbar compression fracture (HCC) 01/25/2016  . Bullous emphysema (HCC) 09/28/2015  . Microalbuminuria due to type 2 diabetes mellitus (HCC) 03/12/2015  . Mild cognitive disorder 05/15/2013  . Vitamin D deficiency   . Hyperlipidemia 03/19/2013  . Uncontrolled type 2 diabetes mellitus with complication, with long-term current use of insulin (HCC) 02/06/2013  . HTN (hypertension) 02/06/2013  . AAA (abdominal aortic aneurysm) without rupture (HCC) 02/06/2013  . Clubbing of fingers 02/06/2013  . Ex-smoker 02/06/2013  . History of unsteady gait 02/06/2013    DUNFORD, CHRISTINA P, PTA 02/04/2018, 3:20 PM  [facility] [address] [place], [place], [address] Phone: [phone]   Fax:  [phone]  Name: Jon [name] MRN: [id number] Date of Birth: 08-06-1945

## 2018-02-06 ENCOUNTER — Encounter: Payer: Self-pay | Admitting: Family Medicine

## 2018-02-06 ENCOUNTER — Ambulatory Visit (INDEPENDENT_AMBULATORY_CARE_PROVIDER_SITE_OTHER): Payer: Medicare Other | Admitting: Family Medicine

## 2018-02-06 VITALS — BP 112/65 | HR 78 | Temp 97.9°F | Ht 72.0 in | Wt 185.5 lb

## 2018-02-06 DIAGNOSIS — G3183 Dementia with Lewy bodies: Secondary | ICD-10-CM | POA: Diagnosis not present

## 2018-02-06 DIAGNOSIS — G20 Parkinson's disease: Secondary | ICD-10-CM

## 2018-02-06 DIAGNOSIS — F0281 Dementia in other diseases classified elsewhere with behavioral disturbance: Secondary | ICD-10-CM | POA: Diagnosis not present

## 2018-02-06 DIAGNOSIS — F02818 Dementia in other diseases classified elsewhere, unspecified severity, with other behavioral disturbance: Secondary | ICD-10-CM

## 2018-02-06 MED ORDER — CARBIDOPA-LEVODOPA 10-100 MG PO TABS: 1.0000 | ORAL_TABLET | Freq: Three times a day (TID) | ORAL | 2 refills | Status: DC

## 2018-02-06 NOTE — Progress Notes (Signed)
Subjective:  Patient ID: Jon [name], male    DOB: 1945-08-20  Age: 72 y.o. MRN: [id number]  CC: Medical Management of Chronic Issues   HPI Jon [name] presents for Follow-up on his instability.  He has been having physical therapy twice a week for the last 4 weeks.  He is still unsteady on his feet.  Wife tells me that he is not doing his exercises.  He says he is.  He is doing the ones apparently that can be done while seated.  On the other hand he is not getting up and walking.  She says that he does not walk to the kitchen to eat and he has trouble getting himself to the bedroom from his chair.  He is still having problems with lashing out at her and in fact before I walked in the room today he told her to keep her mouth shut during the visit she was very reluctant to speak even when directly addressed.  His wife does tell me that he had taken Namenda in the past and it made him sleep all the time so once she checked with his old neurologist and found this out she decided not to give it to him.  His chart is been marked with that reaction.  Patient continues to have tremor seems to be getting worse.  Wife does note that it frequently when he is sitting still.  Primarily in the hands but she notes his legs will get to go away at x2.  It seems better when he is moving his hands and arms.  He does not walk enough for her to be able to tell if the leg tremors better with movement.  Depression screen [facility] 2/9 02/06/2018 09/07/2017 06/08/2017  Decreased Interest 0 0 0  Down, Depressed, Hopeless 0 0 0  PHQ - 2 Score 0 0 0  Altered sleeping - - -  Tired, decreased energy - - -  Change in appetite - - -  Feeling bad or failure about yourself  - - -  Trouble concentrating - - -  Moving slowly or fidgety/restless - - -  Suicidal thoughts - - -  PHQ-9 Score - - -    History Jon [name] has a past medical history of AAA (abdominal aortic aneurysm) (HCC), Anemia, Ataxia, Diabetes mellitus without  complication (HCC), History of gallstones, Hyperlipidemia, Hypertension, and Vitamin D deficiency.   He has a past surgical history that includes ERCP; Inguinal hernia repair; and Tonsillectomy.   His family history includes Diabetes in his brother and mother; Hip fracture in his mother; Hyperlipidemia in his brother; Hypertension in his brother; Lupus in his sister; Pulmonary embolism in his mother; Scleroderma in his sister.He reports that he quit smoking about 9 years ago. His smoking use included cigarettes. He started smoking about 51 years ago. He has a 40.00 pack-year smoking history. He has never used smokeless tobacco. He reports that he does not drink alcohol or use drugs.    ROS Review of Systems  Constitutional: Negative for fever.  Respiratory: Negative for shortness of breath.   Cardiovascular: Negative for chest pain.  Musculoskeletal: Negative for arthralgias.  Skin: Negative for rash.  Neurological: Positive for tremors.    Objective:  BP 112/65   Pulse 78   Temp 97.9 F (36.6 C) (Oral)   Ht 6' (1.829 m)   Wt 185 lb 8 oz (84.1 kg)   BMI 25.16 kg/m    BP Readings from Last 3 Encounters:  02/06/18 112/65  12/24/17 (!) 88/51  09/07/17 109/68    Wt Readings from Last 3 Encounters:  02/06/18 185 lb 8 oz (84.1 kg)  12/24/17 186 lb (84.4 kg)  09/07/17 187 lb 6 oz (85 kg)     Physical Exam  Constitutional: He is oriented to person, place, and time. He appears well-developed.  HENT:  Head: Normocephalic and atraumatic.  Right Ear: External ear normal.  Left Ear: External ear normal.  Mouth/Throat: No oropharyngeal exudate or posterior oropharyngeal erythema.  Eyes: Pupils are equal, round, and reactive to light.  Neck: Normal range of motion. Neck supple.  Cardiovascular: Normal rate and regular rhythm.  No murmur heard. Pulmonary/Chest: Breath sounds normal. No respiratory distress.  Musculoskeletal: He exhibits no edema, tenderness or deformity.    Patient is wheelchair-bound.  He is attempting to ambulate with physical therapy.  He says he walks fine it is just his balance that is poor.  He was able to take 3 steps outside of his wheelchair for me his gait was very weak and unstable.  The steps were very small, parkinsonian in nature  Neurological: He is alert and oriented to person, place, and time.  Vitals reviewed.     Assessment & Plan:   Jon [name] was seen today for medical management of chronic issues.  Diagnoses and all orders for this visit:  Lewy body dementia with behavioral disturbance  Primary Parkinsonism (HCC)  Other orders -     carbidopa-levodopa (SINEMET) 10-100 MG tablet; Take 1 tablet by mouth 3 (three) times daily.   Patient was encouraged to continue physical therapy.  I asked him to try to walk several times daily to short distances using his walker and/or walking behind a wheelchair.  Encouraged him to do his exercises.   I have discontinued Jon [name]'s citalopram and memantine. I am also having him start on carbidopa-levodopa. Additionally, I am having him maintain his aspirin, neomycin-polymyxin-hydrocortisone, Acetaminophen (TYLENOL ARTHRITIS EXT RELIEF PO), omeprazole, Dulaglutide, glucose blood, Insulin Glargine, rivastigmine, Insulin Pen Needle, lisinopril, albuterol, and metFORMIN.  Allergies as of 02/06/2018      Reactions   Donepezil Nausea And Vomiting   Namenda [memantine Hcl] Other (See Comments)   Sleeps all the time      Medication List        Accurate as of 02/06/18 10:48 AM. Always use your most recent med list.          albuterol 108 (90 Base) MCG/ACT inhaler Commonly known as:  PROVENTIL HFA;VENTOLIN HFA Inhale into the lungs.   aspirin 81 MG tablet Take 81 mg by mouth daily.   carbidopa-levodopa 10-100 MG tablet Commonly known as:  SINEMET Take 1 tablet by mouth 3 (three) times daily.   Dulaglutide 1.5 MG/0.5ML Sopn Commonly known as:  TRULICITY Inject 1.5 mg into  the skin once a week.   glucose blood test strip Check fasting and 2 hours after biggest meal   Insulin Glargine 100 UNIT/ML Solostar Pen Commonly known as:  LANTUS SOLOSTAR INJECT 45 UNITS DAILY   Insulin Pen Needle 32G X 6 MM Misc Commonly known as:  SURE COMFORT PEN NEEDLES Use with insulin pen daily   lisinopril 5 MG tablet Commonly known as:  PRINIVIL,ZESTRIL Take 1 tablet (5 mg total) by mouth daily.   metFORMIN 1000 MG tablet Commonly known as:  GLUCOPHAGE Take 1 tablet (1,000 mg total) by mouth 2 (two) times daily with a meal.   neomycin-polymyxin-hydrocortisone 3.5-10000-1 OTIC suspension Commonly known as:  CORTISPORIN Place 3 drops into the left ear 4 (four) times daily.   omeprazole 20 MG capsule Commonly known as:  PRILOSEC Take 1 capsule (20 mg total) by mouth daily.   rivastigmine 4.5 MG capsule Commonly known as:  EXELON Take 1 capsule (4.5 mg total) by mouth 2 (two) times daily.   TYLENOL ARTHRITIS EXT RELIEF PO Take by mouth.        Follow-up: Return in about 6 weeks (around 03/20/2018).  Mechele Claude, M.D.

## 2018-02-07 ENCOUNTER — Ambulatory Visit: Payer: Medicare Other | Admitting: Physical Therapy

## 2018-02-07 ENCOUNTER — Encounter: Payer: Self-pay | Admitting: Physical Therapy

## 2018-02-07 DIAGNOSIS — R2681 Unsteadiness on feet: Secondary | ICD-10-CM

## 2018-02-07 NOTE — Therapy (Signed)
[facility] [address] [place], [place], [address] Phone: [phone]   Fax:  [phone]  Physical Therapy Treatment  Patient Details  Name: Jon [name] MRN: [id number] Date of Birth: [birth year] Referring Provider: Mechele Claude MD   Encounter Date: 02/07/2018  PT End of Session - 02/07/18 1523    Visit Number  9    Number of Visits  16    Date for PT Re-Evaluation  04/01/18    PT Start Time  1429    PT Stop Time  1515    PT Time Calculation (min)  46 min    Equipment Utilized During Treatment  Gait belt    Activity Tolerance  Patient tolerated treatment well    Behavior During Therapy  [facility] for tasks assessed/performed       Past Medical History:  Diagnosis Date  . AAA (abdominal aortic aneurysm) (HCC)   . Anemia   . Ataxia   . Diabetes mellitus without complication (HCC)   . History of gallstones   . Hyperlipidemia   . Hypertension   . Vitamin D deficiency     Past Surgical History:  Procedure Laterality Date  . ERCP    . INGUINAL HERNIA REPAIR    . TONSILLECTOMY      There were no vitals filed for this visit.  Subjective Assessment - 02/07/18 1435    Subjective  Patient did well after last treatment some soreness in right LE for unknown reason    Patient is accompained by:  Family member    Pertinent History  AAA, DM, late onset Alzheimer's.    Limitations  Walking    How long can you walk comfortably?  Use a scooter most of the time.    Currently in Pain?  No/denies                       [facility] Adult PT Treatment/Exercise - 02/07/18 0001      Ambulation/Gait   Ambulation/Gait  Yes    Ambulation/Gait Assistance  4: Min assist    Assistive device  Rolling walker    Gait Pattern  Step-to pattern;Decreased stride length;Decreased step length - left;Decreased hip/knee flexion - right;Decreased dorsiflexion - right;Decreased weight shift to right;Abducted- right;Poor foot clearance - right    Ambulation Surface  Level    Gait Comments  2x59ft, 1x67ft      Exercises   Exercises  Knee/Hip      Knee/Hip Exercises: Aerobic   Nustep  Level 4 x15 minutes; monitoring 75+ steps/minute      Knee/Hip Exercises: Seated   Long Arc Quad  Strengthening;Both;2 sets;10 reps      Knee/Hip Exercises: Supine   Bridges  Both;Strengthening;10 reps;2 sets    Straight Leg Raises  Strengthening;Both;1 set;10 reps          Balance Exercises - 02/07/18 1521      Balance Exercises: Standing   Standing Eyes Opened  Wide (BOA);Foam/compliant surface;Solid surface;5 reps;Limitations;Other (comment);4 reps;10 secs;1 rep   int UE support   Marching Limitations  2x10    Heel Raises Limitations  2x10    Sit to Stand Time  x8          PT Short Term Goals - 01/01/18 1857      PT SHORT TERM GOAL #1   Title  STG's=LTG's.        PT Long Term Goals - 01/24/18 1355      PT LONG TERM GOAL #  1   Title  Independent with an HEP.    Time  8    Period  Weeks    Status  On-going      PT LONG TERM GOAL #2   Title  Patient walk in clinic with FWW with gait belt assist x 150 feet with CGA.    Time  4    Period  Weeks    Status  On-going      PT LONG TERM GOAL #3   Title  Patient stand x 2 minutes with supervision no with loss of balance.    Time  8    Period  Weeks    Status  On-going            Plan - 02/07/18 1524    Clinical Impression Statement  Patient tolerated treatment well today. Patient able to perform exercises and standing activities per requested, with some fatigue after. Patient has some weakness in right LE which makes ambulation and standing exercises challenging. Patient required educational cues for technique and to stay on task. Patient able to perform sit to stand with supervision yet LOB and used UE support. Patient progressing toward goals.     Rehab Potential  Fair    Clinical Impairments Affecting Rehab Potential  Severe balance deficit.    PT Frequency  2x /  week    PT Duration  8 weeks    PT Treatment/Interventions  Gait training;Functional mobility training;Therapeutic activities;Therapeutic exercise;Balance training;Patient/family education;Neuromuscular re-education    PT Next Visit Plan  Nustep; Gait and balance activites. Stepping patterns; attempt supine exercises and ther acts for transfers and bed mobility    Consulted and Agree with Plan of Care  Patient       Patient will benefit from skilled therapeutic intervention in order to improve the following deficits and impairments:  Abnormal gait, Decreased activity tolerance, Decreased balance, Decreased mobility, Decreased strength, Difficulty walking  Visit Diagnosis: Unsteadiness on feet     Problem List Patient Active Problem List   Diagnosis Date Noted  . Muscular incoordination 03/26/2017  . Abnormal chest CT 01/25/2016  . Lumbar compression fracture (HCC) 01/25/2016  . Bullous emphysema (HCC) 09/28/2015  . Microalbuminuria due to type 2 diabetes mellitus (HCC) 03/12/2015  . Mild cognitive disorder 05/15/2013  . Vitamin D deficiency   . Hyperlipidemia 03/19/2013  . Uncontrolled type 2 diabetes mellitus with complication, with long-term current use of insulin (HCC) 02/06/2013  . HTN (hypertension) 02/06/2013  . AAA (abdominal aortic aneurysm) without rupture (HCC) 02/06/2013  . Clubbing of fingers 02/06/2013  . Ex-smoker 02/06/2013  . History of unsteady gait 02/06/2013    Kayann Maj P, PTA 02/07/2018, 3:27 PM  [facility] [address] [place], [place], [address] Phone: [phone]   Fax:  [phone]  Name: Jon [name] MRN: [id number] Date of Birth: Sep 03, 1945

## 2018-02-12 ENCOUNTER — Ambulatory Visit: Payer: Medicare Other | Admitting: Physical Therapy

## 2018-02-12 DIAGNOSIS — R2681 Unsteadiness on feet: Secondary | ICD-10-CM

## 2018-02-12 NOTE — Therapy (Signed)
[facility] [address] [place], [place], [address] Phone: [phone]   Fax:  [phone]  Physical Therapy Treatment  Patient Details  Name: Jon Howellsony L Murch MRN: [id number] Date of Birth: 11-15-1945 Referring Provider: Mechele ClaudeWarren Stacks MD   Encounter Date: 02/12/2018  PT End of Session - 02/12/18 1529    Visit Number  10    Number of Visits  16    Date for PT Re-Evaluation  04/01/18    PT Start Time  1528   late arrival   PT Stop Time  1603    PT Time Calculation (min)  35 min    Equipment Utilized During Treatment  Gait belt    Activity Tolerance  Patient tolerated treatment well    Behavior During Therapy  [facility] for tasks assessed/performed       Past Medical History:  Diagnosis Date  . AAA (abdominal aortic aneurysm) (HCC)   . Anemia   . Ataxia   . Diabetes mellitus without complication (HCC)   . History of gallstones   . Hyperlipidemia   . Hypertension   . Vitamin D deficiency     Past Surgical History:  Procedure Laterality Date  . ERCP    . INGUINAL HERNIA REPAIR    . TONSILLECTOMY      There were no vitals filed for this visit.  Subjective Assessment - 02/12/18 1833    Subjective  Patient reported no new complaints; patient anticipates to get his new wheelchair by the end of the month    Pertinent History  AAA, DM, late onset Alzheimer's.    Limitations  Walking    How long can you walk comfortably?  Use a scooter most of the time.    Currently in Pain?  No/denies         [facility] PT Assessment - 02/12/18 0001      Assessment   Medical Diagnosis  Balance problems.                   [facility] Adult PT Treatment/Exercise - 02/12/18 0001      Transfers   Transfers  Squat Pivot Transfers    Squat Pivot Transfers  4: Min assist;4: Min guard;From elevated surface;With upper extremity assistance;With armrests;Other (comment)    Squat Pivot Transfer Details (indicate cue type and reason)  from high  plinth to standard chair; proper hand and foot placement for transfers      Ambulation/Gait   Ambulation/Gait  Yes    Ambulation/Gait Assistance  4: Min assist    Assistive device  Rolling walker    Gait Pattern  Step-to pattern;Decreased stride length;Decreased step length - left;Decreased hip/knee flexion - right;Decreased dorsiflexion - right;Decreased weight shift to right;Abducted- right;Poor foot clearance - right    Ambulation Surface  Level      Therapeutic Activites    Therapeutic Activities  Other Therapeutic Activities    Other Therapeutic Activities  Patient/family education with squat pivot transfers, proper hand and foot placement, and discussed proper positioning of potenial wheelchair to bed.      Exercises   Exercises  Knee/Hip      Knee/Hip Exercises: Stretches   Hip Flexor Stretch  Both;2 reps;10 seconds    Hip Flexor Stretch Limitations  off edge of bed      Knee/Hip Exercises: Aerobic   Nustep  level 5 x10 minutes 75+ steps/minute          Balance Exercises - 02/12/18 2115  Balance Exercises: Standing   Standing Eyes Opened  Wide (BOA);Time   with walker 1 min 37 seconds         PT Short Term Goals - 01/01/18 1857      PT SHORT TERM GOAL #1   Title  STG's=LTG's.        PT Long Term Goals - 02/12/18 2120      PT LONG TERM GOAL #1   Title  Independent with an HEP.    Time  8    Period  Weeks    Status  On-going      PT LONG TERM GOAL #2   Title  Patient walk in clinic with FWW with gait belt assist x 150 feet with CGA.    Time  4    Period  Weeks    Status  On-going   NT 02/12/18     PT LONG TERM GOAL #3   Title  Patient stand x 2 minutes with supervision no with loss of balance.    Time  8    Period  Weeks    Status  On-going   1 minute with supervison, and walker for support           Plan - 02/12/18 1835    Clinical Impression Statement  Patient was able to tolerate treatment well. Session emphasized on therapeutic  activities as patient will need to learn how to transition from wheelchair to bed. Wife also educated on proper form, hand placement, and foot placement during transfers. Wife reported understanding.  Patient instructed on hip flexor stretch as patient tends to stand with foward flexed trunk. Patient unable to tolerate supine stretch; black inclined bolster placed to elevate trunk during stretch to which patient reported stretch was more tolerable.     Clinical Presentation  Evolving    Clinical Decision Making  Moderate    Rehab Potential  Fair    Clinical Impairments Affecting Rehab Potential  Severe balance deficit.    PT Frequency  2x / week    PT Duration  8 weeks    PT Treatment/Interventions  Gait training;Functional mobility training;Therapeutic activities;Therapeutic exercise;Balance training;Patient/family education;Neuromuscular re-education    PT Next Visit Plan  Nustep; Gait and balance activites. Stepping patterns; attempt supine exercises and ther acts for transfers and bed mobility    Consulted and Agree with Plan of Care  Patient       Patient will benefit from skilled therapeutic intervention in order to improve the following deficits and impairments:  Abnormal gait, Decreased activity tolerance, Decreased balance, Decreased mobility, Decreased strength, Difficulty walking  Visit Diagnosis: Unsteadiness on feet     Problem List Patient Active Problem List   Diagnosis Date Noted  . Muscular incoordination 03/26/2017  . Abnormal chest CT 01/25/2016  . Lumbar compression fracture (HCC) 01/25/2016  . Bullous emphysema (HCC) 09/28/2015  . Microalbuminuria due to type 2 diabetes mellitus (HCC) 03/12/2015  . Mild cognitive disorder 05/15/2013  . Vitamin D deficiency   . Hyperlipidemia 03/19/2013  . Uncontrolled type 2 diabetes mellitus with complication, with long-term current use of insulin (HCC) 02/06/2013  . HTN (hypertension) 02/06/2013  . AAA (abdominal aortic  aneurysm) without rupture (HCC) 02/06/2013  . Clubbing of fingers 02/06/2013  . Ex-smoker 02/06/2013  . History of unsteady gait 02/06/2013   Guss BundeKrystle Yuriana Gaal, PT, DPT 02/12/2018, 9:23 PM  [facility] [address] [place], [place], [address] Phone: [phone]   Fax:  [phone]  Name: [name]  OCTAVIO MATHENEY MRN: [id number] Date of Birth: [birth year]

## 2018-02-13 ENCOUNTER — Ambulatory Visit: Payer: Medicare Other | Admitting: Family Medicine

## 2018-02-14 ENCOUNTER — Encounter: Payer: Self-pay | Admitting: Physical Therapy

## 2018-02-14 ENCOUNTER — Ambulatory Visit: Payer: Medicare Other | Admitting: Physical Therapy

## 2018-02-14 DIAGNOSIS — R2681 Unsteadiness on feet: Secondary | ICD-10-CM | POA: Diagnosis not present

## 2018-02-14 NOTE — Therapy (Signed)
[facility] [address] [place], [place], [address] Phone: [phone]   Fax:  [phone]  Physical Therapy Treatment  Patient Details  Name: Jon [name] MRN: [id number] Date of Birth: 1945/08/27 Referring Provider: Mechele ClaudeWarren Stacks MD   Encounter Date: 02/14/2018  PT End of Session - 02/14/18 1513    Visit Number  11    Number of Visits  16    Date for PT Re-Evaluation  04/01/18    PT Start Time  1428    PT Stop Time  1513    PT Time Calculation (min)  45 min    Equipment Utilized During Treatment  Gait belt    Activity Tolerance  Patient tolerated treatment well    Behavior During Therapy  [facility] for tasks assessed/performed       Past Medical History:  Diagnosis Date  . AAA (abdominal aortic aneurysm) (HCC)   . Anemia   . Ataxia   . Diabetes mellitus without complication (HCC)   . History of gallstones   . Hyperlipidemia   . Hypertension   . Vitamin D deficiency     Past Surgical History:  Procedure Laterality Date  . ERCP    . INGUINAL HERNIA REPAIR    . TONSILLECTOMY      There were no vitals filed for this visit.  Subjective Assessment - 02/14/18 1442    Subjective  Patient did well after last trestment    Patient is accompained by:  Family member    Pertinent History  AAA, DM, late onset Alzheimer's.    Limitations  Walking    How long can you walk comfortably?  Use a scooter most of the time.    Currently in Pain?  No/denies                       [facility] Adult PT Treatment/Exercise - 02/14/18 0001      Ambulation/Gait   Ambulation/Gait  Yes    Ambulation/Gait Assistance  4: Min assist    Assistive device  Rolling walker    Gait Pattern  Step-to pattern;Decreased stride length;Decreased step length - left;Decreased hip/knee flexion - right;Decreased dorsiflexion - right;Decreased weight shift to right;Abducted- right;Poor foot clearance - right    Ambulation Surface  Level    Gait Comments   2x55 feet, 1x20 feet      Exercises   Exercises  Knee/Hip      Knee/Hip Exercises: Aerobic   Nustep  level 5 x15 minutes 75+ steps/minute      Knee/Hip Exercises: Standing   Forward Step Up  2 sets;10 reps;Right;Hand Hold: 2;Step Height: 2"      Knee/Hip Exercises: Seated   Long Arc Quad  Strengthening;Right;2 sets;10 reps;Weights   with gray ball    Clamshell with TheraBand  Green   x30   Marching  Strengthening;Right;2 sets;10 reps    Hamstring Curl  Strengthening;Right;2 sets;10 reps;Limitations    Hamstring Limitations  with green t-band    Sit to Sand  5 reps;1 set;with UE support               PT Short Term Goals - 01/01/18 1857      PT SHORT TERM GOAL #1   Title  STG's=LTG's.        PT Long Term Goals - 02/12/18 2120      PT LONG TERM GOAL #1   Title  Independent with an HEP.    Time  8  Period  Weeks    Status  On-going      PT LONG TERM GOAL #2   Title  Patient walk in clinic with FWW with gait belt assist x 150 feet with CGA.    Time  4    Period  Weeks    Status  On-going   NT 02/12/18     PT LONG TERM GOAL #3   Title  Patient stand x 2 minutes with supervision no with loss of balance.    Time  8    Period  Weeks    Status  On-going   1 minute with supervison, and walker for support           Plan - 02/14/18 1513    Clinical Impression Statement  Patient tolerated treatment well today. Patient required rest breaks with exercises yet able to complete. Patient has difficulty with right LE weakness. Patient required ongoing edcucational cues to stay on task and for technique with gait training and staying inside walker with weight shifting and longer stride on Right. goals ongoing.     Rehab Potential  Fair    Clinical Impairments Affecting Rehab Potential  Severe balance deficit.    PT Frequency  2x / week    PT Duration  8 weeks    PT Treatment/Interventions  Gait training;Functional mobility training;Therapeutic  activities;Therapeutic exercise;Balance training;Patient/family education;Neuromuscular re-education    PT Next Visit Plan  Nustep; Gait and balance activites. Stepping patterns; attempt supine exercises and ther acts for transfers and bed mobility    Consulted and Agree with Plan of Care  Patient       Patient will benefit from skilled therapeutic intervention in order to improve the following deficits and impairments:  Abnormal gait, Decreased activity tolerance, Decreased balance, Decreased mobility, Decreased strength, Difficulty walking  Visit Diagnosis: Unsteadiness on feet     Problem List Patient Active Problem List   Diagnosis Date Noted  . Muscular incoordination 03/26/2017  . Abnormal chest CT 01/25/2016  . Lumbar compression fracture (HCC) 01/25/2016  . Bullous emphysema (HCC) 09/28/2015  . Microalbuminuria due to type 2 diabetes mellitus (HCC) 03/12/2015  . Mild cognitive disorder 05/15/2013  . Vitamin D deficiency   . Hyperlipidemia 03/19/2013  . Uncontrolled type 2 diabetes mellitus with complication, with long-term current use of insulin (HCC) 02/06/2013  . HTN (hypertension) 02/06/2013  . AAA (abdominal aortic aneurysm) without rupture (HCC) 02/06/2013  . Clubbing of fingers 02/06/2013  . Ex-smoker 02/06/2013  . History of unsteady gait 02/06/2013    Blanca Carreon P, PTA 02/14/2018, 3:16 PM  [facility] [address] [place], [place], [address] Phone: [phone]   Fax:  [phone]  Name: Jon [name] MRN: [id number] Date of Birth: 1945-12-17

## 2018-02-19 ENCOUNTER — Ambulatory Visit (INDEPENDENT_AMBULATORY_CARE_PROVIDER_SITE_OTHER): Payer: Medicare Other | Admitting: Family Medicine

## 2018-02-19 ENCOUNTER — Ambulatory Visit: Payer: Medicare Other | Admitting: Physical Therapy

## 2018-02-19 ENCOUNTER — Encounter: Payer: Self-pay | Admitting: Family Medicine

## 2018-02-19 VITALS — BP 113/70 | HR 73 | Ht 72.0 in | Wt 185.0 lb

## 2018-02-19 DIAGNOSIS — R683 Clubbing of fingers: Secondary | ICD-10-CM

## 2018-02-19 DIAGNOSIS — J439 Emphysema, unspecified: Secondary | ICD-10-CM | POA: Diagnosis not present

## 2018-02-19 DIAGNOSIS — F0281 Dementia in other diseases classified elsewhere with behavioral disturbance: Secondary | ICD-10-CM | POA: Diagnosis not present

## 2018-02-19 DIAGNOSIS — G3183 Dementia with Lewy bodies: Secondary | ICD-10-CM

## 2018-02-19 DIAGNOSIS — R2681 Unsteadiness on feet: Secondary | ICD-10-CM | POA: Diagnosis not present

## 2018-02-19 DIAGNOSIS — G20 Parkinson's disease: Secondary | ICD-10-CM | POA: Diagnosis not present

## 2018-02-19 MED ORDER — ROPINIROLE HCL 1 MG PO TABS: 1.0000 mg | ORAL_TABLET | Freq: Three times a day (TID) | ORAL | 2 refills | Status: DC

## 2018-02-19 NOTE — Progress Notes (Signed)
Subjective:  Patient ID: Jon [name], male    DOB: 04/19/1946  Age: 72 y.o. MRN: [id number]  CC: Follow-up (pt here today to have oxygen level checked since wife says at home it was reading 85-90)   HPI Jon [name] presents for concern for hypoxia. Wife was checking his 7202 with home pulse ox and discovered his reading was in the 80s. He was not in distress so they came here. Reading was on room air. He doeshave a history of COPD. He is not 02 dependent. He uses prn albuterol but hasn't needed it recently.  Pt. Also couldn't tolerate the sinemet due to nausea but still has tremor.  Depression screen [facility] 2/9 02/06/2018 09/07/2017 06/08/2017  Decreased Interest 0 0 0  Down, Depressed, Hopeless 0 0 0  PHQ - 2 Score 0 0 0  Altered sleeping - - -  Tired, decreased energy - - -  Change in appetite - - -  Feeling bad or failure about yourself  - - -  Trouble concentrating - - -  Moving slowly or fidgety/restless - - -  Suicidal thoughts - - -  PHQ-9 Score - - -    History Jon [name] has a past medical history of AAA (abdominal aortic aneurysm) (HCC), Anemia, Ataxia, Diabetes mellitus without complication (HCC), History of gallstones, Hyperlipidemia, Hypertension, and Vitamin D deficiency.   He has a past surgical history that includes ERCP; Inguinal hernia repair; and Tonsillectomy.   His family history includes Diabetes in his brother and mother; Hip fracture in his mother; Hyperlipidemia in his brother; Hypertension in his brother; Lupus in his sister; Pulmonary embolism in his mother; Scleroderma in his sister.He reports that he quit smoking about 9 years ago. His smoking use included cigarettes. He started smoking about 51 years ago. He has a 40.00 pack-year smoking history. He has never used smokeless tobacco. He reports that he does not drink alcohol or use drugs.    ROS Review of Systems  Constitutional: Negative for fever.  Respiratory: Negative for shortness of breath.     Cardiovascular: Negative for chest pain.  Musculoskeletal: Negative for arthralgias.  Skin: Negative for rash.  Neurological: Positive for tremors.    Objective:  BP 113/70   Pulse 73   Ht 6' (1.829 m)   Wt 185 lb (83.9 kg)   SpO2 95%   BMI 25.09 kg/m   BP Readings from Last 3 Encounters:  02/19/18 113/70  02/06/18 112/65  12/24/17 (!) 88/51    Wt Readings from Last 3 Encounters:  02/19/18 185 lb (83.9 kg)  02/06/18 185 lb 8 oz (84.1 kg)  12/24/17 186 lb (84.4 kg)     Physical Exam  Constitutional: He is oriented to person, place, and time. He appears well-developed and well-nourished.  HENT:  Head: Normocephalic and atraumatic.  Right Ear: Tympanic membrane and external ear normal. No decreased hearing is noted.  Left Ear: Tympanic membrane and external ear normal. No decreased hearing is noted.  Mouth/Throat: No oropharyngeal exudate or posterior oropharyngeal erythema.  Eyes: Pupils are equal, round, and reactive to light.  Neck: Normal range of motion. Neck supple.  Cardiovascular: Normal rate and regular rhythm.  No murmur heard. Pulmonary/Chest: Effort normal and breath sounds normal. No respiratory distress.  clubbing of fingers noted   Abdominal: Soft.  Neurological: He is alert and oriented to person, place, and time. He exhibits abnormal muscle tone (hands have rest tremor).  Skin: Skin is warm and dry.  Vitals reviewed.  Assessment & Plan:   Jon [name] was seen today for follow-up.  Diagnoses and all orders for this visit:  Clubbing of fingers  Lewy body dementia with behavioral disturbance  Primary Parkinsonism (HCC)  Bullous emphysema (HCC)  Other orders -     rOPINIRole (REQUIP) 1 MG tablet; Take 1 tablet (1 mg total) by mouth 3 (three) times daily.       I have discontinued Jon [name] L. Kuchenbecker's carbidopa-levodopa. I am also having him start on rOPINIRole. Additionally, I am having him maintain his aspirin,  neomycin-polymyxin-hydrocortisone, Acetaminophen (TYLENOL ARTHRITIS EXT RELIEF PO), omeprazole, Dulaglutide, glucose blood, Insulin Glargine, rivastigmine, Insulin Pen Needle, lisinopril, albuterol, metFORMIN, and citalopram.  Allergies as of 02/19/2018      Reactions   Donepezil Nausea And Vomiting   Namenda [memantine Hcl] Other (See Comments)   Sleeps all the time      Medication List        Accurate as of 02/19/18  6:05 PM. Always use your most recent med list.          albuterol 108 (90 Base) MCG/ACT inhaler Commonly known as:  PROVENTIL HFA;VENTOLIN HFA Inhale into the lungs.   aspirin 81 MG tablet Take 81 mg by mouth daily.   citalopram 10 MG tablet Commonly known as:  CELEXA Take 10 mg by mouth daily.   Dulaglutide 1.5 MG/0.5ML Sopn Inject 1.5 mg into the skin once a week.   glucose blood test strip Check fasting and 2 hours after biggest meal   Insulin Glargine 100 UNIT/ML Solostar Pen Commonly known as:  LANTUS INJECT 45 UNITS DAILY   Insulin Pen Needle 32G X 6 MM Misc Use with insulin pen daily   lisinopril 5 MG tablet Commonly known as:  PRINIVIL,ZESTRIL Take 1 tablet (5 mg total) by mouth daily.   metFORMIN 1000 MG tablet Commonly known as:  GLUCOPHAGE Take 1 tablet (1,000 mg total) by mouth 2 (two) times daily with a meal.   neomycin-polymyxin-hydrocortisone 3.5-10000-1 OTIC suspension Commonly known as:  CORTISPORIN Place 3 drops into the left ear 4 (four) times daily.   omeprazole 20 MG capsule Commonly known as:  PRILOSEC Take 1 capsule (20 mg total) by mouth daily.   rivastigmine 4.5 MG capsule Commonly known as:  EXELON Take 1 capsule (4.5 mg total) by mouth 2 (two) times daily.   rOPINIRole 1 MG tablet Commonly known as:  REQUIP Take 1 tablet (1 mg total) by mouth 3 (three) times daily.   TYLENOL ARTHRITIS EXT RELIEF PO Take by mouth.      Reassured regarding hypoxia.  Pulse ox normal here. Follow-up: Return if symptoms worsen  or fail to improve.  Mechele ClaudeWarren Aquarius Tremper, M.D.

## 2018-02-19 NOTE — Therapy (Signed)
[facility] [place], [place], [address] Phone: [phone]   Fax:  [phone]  Physical Therapy Treatment  Patient Details  Name: Jon [name] MRN: [id number] Date of Birth: 1946-02-11 Referring Provider: Claretta Fraise MD   Encounter Date: 02/19/2018  PT End of Session - 02/19/18 1437    Visit Number  12    Number of Visits  16    Date for PT Re-Evaluation  04/01/18    PT Start Time  1430    PT Stop Time  4742   extended rests secondary to shortness of breath and vitals assessment   PT Time Calculation (min)  45 min    Equipment Utilized During Treatment  Gait belt    Activity Tolerance  Patient limited by fatigue;Treatment limited secondary to medical complications (Comment);Other (comment)   limited by shortness of breath   Behavior During Therapy  [facility] for tasks assessed/performed       Past Medical History:  Diagnosis Date  . AAA (abdominal aortic aneurysm) ([place])   . Anemia   . Ataxia   . Diabetes mellitus without complication ([place])   . History of gallstones   . Hyperlipidemia   . Hypertension   . Vitamin D deficiency     Past Surgical History:  Procedure Laterality Date  . ERCP    . INGUINAL HERNIA REPAIR    . TONSILLECTOMY      There were no vitals filed for this visit.      [facility] PT Assessment - 02/19/18 0001      Assessment   Medical Diagnosis  Balance problems.                   [facility] Adult PT Treatment/Exercise - 02/19/18 0001      Transfers   Transfers  Squat Pivot Transfers    Squat Pivot Transfers  4: Min assist;From elevated surface;With upper extremity assistance;With armrests;Other (comment)    Squat Pivot Transfer Details (indicate cue type and reason)  from 28 inch plinth to simulate height of bed    Comments  Still constant cues for technique, safety and hand placement. improved carryover of hand placement for sit to stand but still requires 100% verbal and tactile  cuing to align body with chair for stand to sit.      Ambulation/Gait   Ambulation/Gait  Yes    Ambulation/Gait Assistance  4: Min assist    Ambulation Distance (Feet)  92 Feet    Assistive device  Rolling walker    Gait Pattern  Step-to pattern;Decreased stride length;Decreased step length - left;Decreased hip/knee flexion - right;Decreased dorsiflexion - right;Decreased weight shift to right;Abducted- right;Poor foot clearance - right    Ambulation Surface  Level;Indoor    Gait Comments  one standing rest break; constant verbal and tactile cuing for safety and use of foot at leg of walker to prevent advancement too far from body      Exercises   Exercises  Knee/Hip      Knee/Hip Exercises: Aerobic   Nustep  level 5 x7 minutes   terminated secondary to shortness of breath     Knee/Hip Exercises: Standing   Other Standing Knee Exercises  standing glute sets x20          Balance Exercises - 02/19/18 1812      Balance Exercises: Standing   Standing Eyes Opened  Wide (BOA);Time   1 minute 39 seconds         PT  Short Term Goals - 01/01/18 1857      PT SHORT TERM GOAL #1   Title  STG's=LTG's.        PT Long Term Goals - 02/19/18 1822      PT LONG TERM GOAL #1   Title  Independent with an HEP.    Time  8    Period  Weeks    Status  On-going   not performing at home per wife     PT LONG TERM GOAL #2   Title  Patient walk in clinic with FWW with gait belt assist x 150 feet with CGA.    Time  4    Period  Weeks    Status  On-going   min A 92 feet and constant cuing     PT LONG TERM GOAL #3   Title  Patient stand x 2 minutes with supervision no with loss of balance.    Time  8    Period  Weeks    Status  On-going   CG assist with walker; 1 minute 39 seconds           Plan - 02/19/18 1813    Clinical Impression Statement  Patient was able to complete treatment despite reports of shortness of breath. Patient was provided rest on nustep. Patient's SaO2  was monitored and read 86% and after pursed lip breathing SaO2 93%. Goals were assessed; goals are ongoing. Patient still requires min A for ambulation with constant verbal and tactile cuing for sequencing and for adequate right step length. PT still needs to place foot in front of walker to prevent walker advancing too far from body. Patient performed wide base of support standing with support from walker; CGA required as patient's right leg began to tremble. Patient, patient's wife, and PT discussed limited progress that has been made and visits will be completed and will anticipate discharge if no goals are met. Patient's wife reported understanding.     Clinical Presentation  Evolving    Clinical Decision Making  Moderate    Rehab Potential  Fair    Clinical Impairments Affecting Rehab Potential  Severe balance deficit.    PT Frequency  2x / week    PT Duration  8 weeks    PT Treatment/Interventions  Gait training;Functional mobility training;Therapeutic activities;Therapeutic exercise;Balance training;Patient/family education;Neuromuscular re-education    PT Next Visit Plan  Nustep; Gait and balance activites. Stepping patterns; attempt supine exercises and ther acts for transfers and bed mobility    Consulted and Agree with Plan of Care  Patient       Patient will benefit from skilled therapeutic intervention in order to improve the following deficits and impairments:  Abnormal gait, Decreased activity tolerance, Decreased balance, Decreased mobility, Decreased strength, Difficulty walking  Visit Diagnosis: Unsteadiness on feet     Problem List Patient Active Problem List   Diagnosis Date Noted  . Muscular incoordination 03/26/2017  . Abnormal chest CT 01/25/2016  . Lumbar compression fracture ([place]) 01/25/2016  . Bullous emphysema ([place]) 09/28/2015  . Microalbuminuria due to type 2 diabetes mellitus ([place]) 03/12/2015  . Mild cognitive disorder 05/15/2013  . Vitamin D deficiency   .  Hyperlipidemia 03/19/2013  . Uncontrolled type 2 diabetes mellitus with complication, with long-term current use of insulin ([place]) 02/06/2013  . HTN (hypertension) 02/06/2013  . AAA (abdominal aortic aneurysm) without rupture ([place]) 02/06/2013  . Clubbing of fingers 02/06/2013  . Ex-smoker 02/06/2013  . History of unsteady gait 02/06/2013  Gabriela Eves, PT, DPT 02/19/2018, 6:26 PM  [facility] [address] [place], [place], [address] Phone: [phone]   Fax:  [phone]  Name: DARRIS STAIGER MRN: [id number] Date of Birth: 03/21/1946

## 2018-02-21 ENCOUNTER — Encounter: Payer: Medicare Other | Admitting: Physical Therapy

## 2018-02-26 ENCOUNTER — Telehealth: Payer: Self-pay | Admitting: Family Medicine

## 2018-02-26 NOTE — Telephone Encounter (Signed)
Wife is calling, concerned because he has not been eating much lately.  He just nibbles at his meals and she said normally he is a big eater, wanting a meal and then a snack in between.  She said he had recently started the Citalopram and wasn't sure if that could make a difference.  Please advise.

## 2018-02-26 NOTE — Telephone Encounter (Signed)
Pt's wife is aware of MD feedback and voiced understanding. 

## 2018-02-26 NOTE — Telephone Encounter (Signed)
That can happen. Monitor weight. If he loses more than 5 lbs let me know.

## 2018-02-27 ENCOUNTER — Telehealth: Payer: Self-pay | Admitting: Family Medicine

## 2018-02-27 ENCOUNTER — Ambulatory Visit: Payer: Medicare Other | Admitting: Physical Therapy

## 2018-02-27 ENCOUNTER — Encounter: Payer: Self-pay | Admitting: Physical Therapy

## 2018-02-27 DIAGNOSIS — R2681 Unsteadiness on feet: Secondary | ICD-10-CM | POA: Diagnosis not present

## 2018-02-27 NOTE — Telephone Encounter (Signed)
Patient already had an upcoming appt but moved appt up and advised patient to contact office if anything changes. Patients wife verbalized understanding.

## 2018-02-27 NOTE — Telephone Encounter (Signed)
Wife states that he continues to lose weight. He didn't get out of bed today until 10 and didn't eat breakfast until 11am. Please review and advise

## 2018-02-27 NOTE — Telephone Encounter (Signed)
Pt. Needs to be seen for this. Thanks, WS 

## 2018-02-27 NOTE — Therapy (Addendum)
[facility] [place], [place], [address] Phone: [phone]   Fax:  [phone]  Physical Therapy Treatment  Patient Details  Name: Jon [name] MRN: [id number] Date of Birth: 1945/12/28 Referring Provider: Claretta Fraise MD   Encounter Date: 02/27/2018  PT End of Session - 02/27/18 1455    Visit Number  13    Number of Visits  16    Date for PT Re-Evaluation  04/01/18    PT Start Time  1350    PT Stop Time  1440    PT Time Calculation (min)  50 min    Equipment Utilized During Treatment  Gait belt    Activity Tolerance  Patient limited by fatigue;Treatment limited secondary to medical complications (Comment);Other (comment)    Behavior During Therapy  WFL for tasks assessed/performed       Past Medical History:  Diagnosis Date  . AAA (abdominal aortic aneurysm) ([place])   . Anemia   . Ataxia   . Diabetes mellitus without complication ([place])   . History of gallstones   . Hyperlipidemia   . Hypertension   . Vitamin D deficiency     Past Surgical History:  Procedure Laterality Date  . ERCP    . INGUINAL HERNIA REPAIR    . TONSILLECTOMY      There were no vitals filed for this visit.  Subjective Assessment - 02/27/18 1354    Subjective  Patient arrived a little agitated and reported almost falling, wife reported patient not eating or doing anything at home just sits all day long    Patient is accompained by:  Family member   wife   Pertinent History  AAA, DM, late onset Alzheimer's.    Limitations  Walking    How long can you walk comfortably?  Use a scooter most of the time.    Currently in Pain?  No/denies                       [facility] Adult PT Treatment/Exercise - 02/27/18 0001      Ambulation/Gait   Ambulation/Gait  Yes    Ambulation/Gait Assistance  4: Min assist    Assistive device  Rolling walker    Gait Pattern  Step-to pattern;Decreased stride length;Decreased step length -  left;Decreased hip/knee flexion - right;Decreased dorsiflexion - right;Decreased weight shift to right;Abducted- right;Poor foot clearance - right    Ambulation Surface  Level;Indoor    Gait Comments  4f, [id number]     Exercises   Exercises  Knee/Hip      Knee/Hip Exercises: Aerobic   Nustep  Level 6 x10m64mE/LE monitored for progression   educational cues for pace 80 step per min     Knee/Hip Exercises: Standing   Other Standing Knee Exercises  [employer] pews at mat [facility]      Knee/Hip Exercises: Seated   Marching  Strengthening;Right          Balance Exercises - 02/27/18 1440      Balance Exercises: Standing   Standing Eyes Opened  Wide (BOA);Solid surface   weight shifting x5min69mStepping Strategy  Anterior;Posterior;Lateral;UE support   manual UE suport bil UE    Sidestepping  Upper extremity support   maunual bil UE support/mat table behind 2x5   Heel Raises Limitations  2x10    Sit to Stand Time  x10          PT Short Term Goals -  01/01/18 1857      PT SHORT TERM GOAL #1   Title  STG's=LTG's.        PT Long Term Goals - 02/19/18 1822      PT LONG TERM GOAL #1   Title  Independent with an HEP.    Time  8    Period  Weeks    Status  On-going   not performing at home per wife     PT LONG TERM GOAL #2   Title  Patient walk in clinic with FWW with gait belt assist x 150 feet with CGA.    Time  4    Period  Weeks    Status  On-going   min A 92 feet and constant cuing     PT LONG TERM GOAL #3   Title  Patient stand x 2 minutes with supervision no with loss of balance.    Time  8    Period  Weeks    Status  On-going   CG assist with walker; 1 minute 39 seconds           Plan - 02/27/18 1456    Clinical Impression Statement  Patient tolerated treatment fair today. Patient reported he had a bad day. He reported he had LOB and almost fell at home. His wife helped him and no injury reported. Today patient was very fatigue and unmotivated to  start with. Took patient vitals and weight per wife request. Wt 181.1 HR 84 BP 110/74 O2 94% (WNL) Today patient educated on proper stepping stategies and weight shifting to help assist with gait training. Patient able to complete exercises yet required multiple cueing and motivation. Patient current goals ongoing. Patient wife made appt with general MD regarding patients new condition of right UE shaking and his leaning toward the left. His nurilogist appt is in oct.    Rehab Potential  Fair    Clinical Impairments Affecting Rehab Potential  Severe balance deficit.    PT Frequency  2x / week    PT Duration  8 weeks    PT Treatment/Interventions  Gait training;Functional mobility training;Therapeutic activities;Therapeutic exercise;Balance training;Patient/family education;Neuromuscular re-education    PT Next Visit Plan  assess goals/Nustep; Gait and balance activites. Stepping patterns; attempt supine exercises and ther acts for transfers and bed mobility    Consulted and Agree with Plan of Care  Patient       Patient will benefit from skilled therapeutic intervention in order to improve the following deficits and impairments:  Abnormal gait, Decreased activity tolerance, Decreased balance, Decreased mobility, Decreased strength, Difficulty walking  Visit Diagnosis: Unsteadiness on feet     Problem List Patient Active Problem List   Diagnosis Date Noted  . Muscular incoordination 03/26/2017  . Abnormal chest CT 01/25/2016  . Lumbar compression fracture ([place]) 01/25/2016  . Bullous emphysema ([place]) 09/28/2015  . Microalbuminuria due to type 2 diabetes mellitus ([place]) 03/12/2015  . Mild cognitive disorder 05/15/2013  . Vitamin D deficiency   . Hyperlipidemia 03/19/2013  . Uncontrolled type 2 diabetes mellitus with complication, with long-term current use of insulin ([place]) 02/06/2013  . HTN (hypertension) 02/06/2013  . AAA (abdominal aortic aneurysm) without rupture ([place]) 02/06/2013  .  Clubbing of fingers 02/06/2013  . Ex-smoker 02/06/2013  . History of unsteady gait 02/06/2013    Kadelyn Dimascio P, PTA 02/27/2018, 3:28 PM  [facility] [address] [place], [place], [address] Phone: [phone]   Fax:  [phone]  Name:  Jon [name] MRN: [id number] Date of Birth: 06/08/1946  PHYSICAL THERAPY DISCHARGE SUMMARY  Visits from Start of Care: 13.  Current functional level related to goals / functional outcomes: See above.   Remaining deficits: See goal section.   Education / Equipment: HEP. Plan: Patient agrees to discharge.  Patient goals were not met. Patient is being discharged due to not returning since the last visit.  ?????         Mali Applegate MPT

## 2018-03-09 ENCOUNTER — Other Ambulatory Visit: Payer: Self-pay | Admitting: Family Medicine

## 2018-03-11 ENCOUNTER — Encounter: Payer: Self-pay | Admitting: Family Medicine

## 2018-03-12 ENCOUNTER — Ambulatory Visit (INDEPENDENT_AMBULATORY_CARE_PROVIDER_SITE_OTHER): Payer: Medicare Other | Admitting: Family Medicine

## 2018-03-12 ENCOUNTER — Encounter: Payer: Self-pay | Admitting: Family Medicine

## 2018-03-12 VITALS — BP 93/52 | HR 68 | Temp 98.5°F | Ht 72.0 in | Wt 183.2 lb

## 2018-03-12 DIAGNOSIS — G3183 Dementia with Lewy bodies: Secondary | ICD-10-CM | POA: Diagnosis not present

## 2018-03-12 DIAGNOSIS — R278 Other lack of coordination: Secondary | ICD-10-CM

## 2018-03-12 DIAGNOSIS — E1165 Type 2 diabetes mellitus with hyperglycemia: Secondary | ICD-10-CM

## 2018-03-12 DIAGNOSIS — IMO0002 Reserved for concepts with insufficient information to code with codable children: Secondary | ICD-10-CM

## 2018-03-12 DIAGNOSIS — F09 Unspecified mental disorder due to known physiological condition: Secondary | ICD-10-CM

## 2018-03-12 DIAGNOSIS — E118 Type 2 diabetes mellitus with unspecified complications: Secondary | ICD-10-CM

## 2018-03-12 DIAGNOSIS — Z794 Long term (current) use of insulin: Secondary | ICD-10-CM | POA: Diagnosis not present

## 2018-03-12 DIAGNOSIS — F0281 Dementia in other diseases classified elsewhere with behavioral disturbance: Secondary | ICD-10-CM

## 2018-03-12 NOTE — Progress Notes (Signed)
Subjective:  Patient ID: Jon [name], male    DOB: 19-May-1946  Age: 72 y.o. MRN: [id number]  CC: Weight Loss   HPI ARLOW COACHMAN presents for wifes concern that the antidepressant seems to be making him sluggish and his appetite is not good,  also concerned about the weight loss and his sugar is running kind of low.  She sent these concerns in a message yesterday. He is not eating his snack like he used to. She is havingto encourage him to eat at times. In his prime he weighed over 200 lb. Now losing a lot. He trid boost, wouldn't stick with it. Denies hypoglycemia with current DM regimen, but glucose running 88-120. More somnolent she says. Awakening about an hour later than he used to.. Weak all over. Attending PT for balance, but hasn't been in two weeks. Has fallen twice. Refuses to take the ropinirole as it is "too medicine."    Depression screen [facility] 2/9 03/12/2018 02/06/2018 09/07/2017  Decreased Interest 0 0 0  Down, Depressed, Hopeless 0 0 0  PHQ - 2 Score 0 0 0  Altered sleeping - - -  Tired, decreased energy - - -  Change in appetite - - -  Feeling bad or failure about yourself  - - -  Trouble concentrating - - -  Moving slowly or fidgety/restless - - -  Suicidal thoughts - - -  PHQ-9 Score - - -    History Hilmer has a past medical history of AAA (abdominal aortic aneurysm) (HCC), Anemia, Ataxia, Diabetes mellitus without complication (HCC), History of gallstones, Hyperlipidemia, Hypertension, and Vitamin D deficiency.   He has a past surgical history that includes ERCP; Inguinal hernia repair; and Tonsillectomy.   His family history includes Diabetes in his brother and mother; Hip fracture in his mother; Hyperlipidemia in his brother; Hypertension in his brother; Lupus in his sister; Pulmonary embolism in his mother; Scleroderma in his sister.He reports that he quit smoking about 9 years ago. His smoking use included cigarettes. He started smoking about 51 years ago. He has a  40.00 pack-year smoking history. He has never used smokeless tobacco. He reports that he does not drink alcohol or use drugs.    ROS Review of Systems  Constitutional: Positive for activity change, appetite change, fatigue and unexpected weight change.  HENT: Negative.   Eyes: Negative for visual disturbance.  Respiratory: Positive for shortness of breath (at baseline).   Cardiovascular: Negative for chest pain and leg swelling.  Gastrointestinal: Negative for abdominal pain, diarrhea, nausea and vomiting.  Genitourinary: Negative for difficulty urinating.  Musculoskeletal: Negative for myalgias.  Skin: Negative for rash.  Neurological: Negative for headaches.  Psychiatric/Behavioral: Negative for sleep disturbance.    Objective:  BP (!) 93/52   Pulse 68   Temp 98.5 F (36.9 C) (Oral)   Ht 6' (1.829 m)   Wt 183 lb 4 oz (83.1 kg)   BMI 24.85 kg/m   BP Readings from Last 3 Encounters:  03/12/18 (!) 93/52  02/19/18 113/70  02/06/18 112/65    Wt Readings from Last 3 Encounters:  03/12/18 183 lb 4 oz (83.1 kg)  02/19/18 185 lb (83.9 kg)  02/06/18 185 lb 8 oz (84.1 kg)     Physical Exam  Constitutional: He is oriented to person, place, and time. He appears well-developed and well-nourished. No distress.  HENT:  Head: Normocephalic and atraumatic.  Right Ear: External ear normal.  Left Ear: External ear normal.  Nose: Nose  normal.  Mouth/Throat: Oropharynx is clear and moist.  Eyes: Pupils are equal, round, and reactive to light. Conjunctivae and EOM are normal.  Neck: Normal range of motion. Neck supple.  Cardiovascular: Normal rate, regular rhythm and normal heart sounds.  No murmur heard. Pulmonary/Chest: Effort normal and breath sounds normal. No respiratory distress. He has no wheezes. He has no rales.  Abdominal: Soft. He exhibits distension (protuberant). There is no tenderness.  Musculoskeletal: Normal range of motion.  Neurological: He is alert and oriented  to person, place, and time. He has normal reflexes. No cranial nerve deficit.  Skin: Skin is warm and dry.  Psychiatric: He has a normal mood and affect. His speech is normal and behavior is normal. Thought content normal. Cognition and memory are impaired. He expresses impulsivity.    Pt. Weight is unchanged from three visits  About 1 year ago on record review.  Assessment & Plan:   Detric was seen today for weight loss.  Diagnoses and all orders for this visit:  Lewy body dementia with behavioral disturbance  Mild cognitive disorder  Muscular incoordination  Uncontrolled type 2 diabetes mellitus with complication, with long-term current use of insulin (HCC)    Patient seems overall quite stable.  Sleeping an hour extra in the morning is not unusual considering his complicated medical situation.  Additionally the concerns about weight loss are not borne out by chart review.  Certainly if he continues to eat less that may become an issue.  Currently however he is holding his own with his weight.  His biggest change over the last year has been that his diabetes is actually much better controlled.  He also is having more problems with balance.  He was encouraged today to attend physical therapy regularly.  He is not walking well at home and in a wheelchair here today.  I believe that drowsiness is likely related to him getting used to the new citalopram.  I agree that he does have some polypharmacy ongoing so coming off the ropinirole is a reasonable request.   I have discontinued Talbot L. Grinder's albuterol. I am also having him maintain his aspirin, neomycin-polymyxin-hydrocortisone, Acetaminophen (TYLENOL ARTHRITIS EXT RELIEF PO), omeprazole, Dulaglutide, glucose blood, Insulin Glargine, rivastigmine, lisinopril, metFORMIN, citalopram, rOPINIRole, and SURE COMFORT PEN NEEDLES.  Allergies as of 03/12/2018      Reactions   Donepezil Nausea And Vomiting   Namenda [memantine Hcl] Other (See  Comments)   Sleeps all the time      Medication List        Accurate as of 03/12/18 12:21 PM. Always use your most recent med list.          aspirin 81 MG tablet Take 81 mg by mouth daily.   citalopram 10 MG tablet Commonly known as:  CELEXA Take 10 mg by mouth daily.   Dulaglutide 1.5 MG/0.5ML Sopn Inject 1.5 mg into the skin once a week.   glucose blood test strip Check fasting and 2 hours after biggest meal   Insulin Glargine 100 UNIT/ML Solostar Pen Commonly known as:  LANTUS INJECT 45 UNITS DAILY   lisinopril 5 MG tablet Commonly known as:  PRINIVIL,ZESTRIL Take 1 tablet (5 mg total) by mouth daily.   metFORMIN 1000 MG tablet Commonly known as:  GLUCOPHAGE Take 1 tablet (1,000 mg total) by mouth 2 (two) times daily with a meal.   neomycin-polymyxin-hydrocortisone 3.5-10000-1 OTIC suspension Commonly known as:  CORTISPORIN Place 3 drops into the left ear 4 (four) times  daily.   omeprazole 20 MG capsule Commonly known as:  PRILOSEC Take 1 capsule (20 mg total) by mouth daily.   rivastigmine 4.5 MG capsule Commonly known as:  EXELON Take 1 capsule (4.5 mg total) by mouth 2 (two) times daily.   rOPINIRole 1 MG tablet Commonly known as:  REQUIP Take 1 tablet (1 mg total) by mouth 3 (three) times daily.   SURE COMFORT PEN NEEDLES 32G X 6 MM Misc Generic drug:  Insulin Pen Needle USE AS DIRECTED   TYLENOL ARTHRITIS EXT RELIEF PO Take by mouth.        Follow-up: Return in about 1 month (around 04/11/2018), or if symptoms worsen or fail to improve.  Mechele Claude, M.D.

## 2018-03-12 NOTE — Patient Instructions (Signed)
Use Glucerna for Diet Supplement

## 2018-03-19 ENCOUNTER — Telehealth: Payer: Self-pay | Admitting: Family Medicine

## 2018-03-19 DIAGNOSIS — I714 Abdominal aortic aneurysm, without rupture, unspecified: Secondary | ICD-10-CM

## 2018-03-19 NOTE — Telephone Encounter (Signed)
PT wife is wanting to speak to nurse about how her [facility] , and  wants someone closer

## 2018-03-19 NOTE — Telephone Encounter (Signed)
Spoke with pt's wife and she states [place]. Referral placed and pt's wife aware we will call once appt scheduled.

## 2018-03-22 ENCOUNTER — Ambulatory Visit: Payer: Medicare Other | Admitting: Family Medicine

## 2018-04-01 ENCOUNTER — Other Ambulatory Visit: Payer: Self-pay

## 2018-04-01 DIAGNOSIS — I714 Abdominal aortic aneurysm, without rupture, unspecified: Secondary | ICD-10-CM

## 2018-04-01 DIAGNOSIS — I6529 Occlusion and stenosis of unspecified carotid artery: Secondary | ICD-10-CM

## 2018-04-02 DIAGNOSIS — G301 Alzheimer's disease with late onset: Secondary | ICD-10-CM | POA: Diagnosis not present

## 2018-04-02 DIAGNOSIS — F0280 Dementia in other diseases classified elsewhere without behavioral disturbance: Secondary | ICD-10-CM | POA: Diagnosis not present

## 2018-04-08 ENCOUNTER — Ambulatory Visit (INDEPENDENT_AMBULATORY_CARE_PROVIDER_SITE_OTHER): Payer: Medicare Other | Admitting: Vascular Surgery

## 2018-04-08 ENCOUNTER — Encounter: Payer: Self-pay | Admitting: Vascular Surgery

## 2018-04-08 ENCOUNTER — Ambulatory Visit (HOSPITAL_COMMUNITY)
Admission: RE | Admit: 2018-04-08 | Discharge: 2018-04-08 | Disposition: A | Discharge status: Home / Self Care | Payer: Medicare Other | Source: Ambulatory Visit | Attending: Vascular Surgery | Admitting: Vascular Surgery

## 2018-04-08 VITALS — BP 117/61 | HR 75 | Temp 98.6°F | Resp 16 | Ht 70.0 in | Wt 195.0 lb

## 2018-04-08 DIAGNOSIS — I714 Abdominal aortic aneurysm, without rupture, unspecified: Secondary | ICD-10-CM

## 2018-04-08 DIAGNOSIS — Z79899 Other long term (current) drug therapy: Secondary | ICD-10-CM | POA: Diagnosis not present

## 2018-04-08 DIAGNOSIS — I6529 Occlusion and stenosis of unspecified carotid artery: Secondary | ICD-10-CM

## 2018-04-08 DIAGNOSIS — R2689 Other abnormalities of gait and mobility: Secondary | ICD-10-CM | POA: Diagnosis not present

## 2018-04-08 DIAGNOSIS — Z794 Long term (current) use of insulin: Secondary | ICD-10-CM | POA: Diagnosis not present

## 2018-04-08 DIAGNOSIS — E119 Type 2 diabetes mellitus without complications: Secondary | ICD-10-CM | POA: Diagnosis not present

## 2018-04-08 DIAGNOSIS — F0280 Dementia in other diseases classified elsewhere without behavioral disturbance: Secondary | ICD-10-CM | POA: Diagnosis not present

## 2018-04-08 DIAGNOSIS — Z9181 History of falling: Secondary | ICD-10-CM | POA: Diagnosis not present

## 2018-04-08 DIAGNOSIS — G301 Alzheimer's disease with late onset: Secondary | ICD-10-CM | POA: Diagnosis not present

## 2018-04-08 DIAGNOSIS — I6523 Occlusion and stenosis of bilateral carotid arteries: Secondary | ICD-10-CM | POA: Diagnosis not present

## 2018-04-08 DIAGNOSIS — I10 Essential (primary) hypertension: Secondary | ICD-10-CM | POA: Diagnosis not present

## 2018-04-08 DIAGNOSIS — I6522 Occlusion and stenosis of left carotid artery: Secondary | ICD-10-CM | POA: Diagnosis not present

## 2018-04-08 NOTE — Progress Notes (Signed)
Vascular and Vein Specialist of [facility]  Patient name: Jon [name] MRN: [id number] DOB: 01/29/1946 Sex: male  REASON FOR CONSULT: Follow-up of known abdominal aortic aneurysm and carotid stenosis  Seen today in our [place] office  HPI: Jon [name] is a 72 y.o. male, who is her today for establishment of care with our practice..  He has been followed in [place] with small abdominal aortic aneurysm and known asymptomatic carotid disease.  He is here today to discuss follow-up ultrasound done at [facility].  He has no prior symptoms of his aneurysm and has no prior focal neurologic deficits.  Does have some mild cognitive disorder.  He does have ataxia and is in a motorized scooter today.  Has a long smoking history but quit in 2010.  Past Medical History:  Diagnosis Date  . AAA (abdominal aortic aneurysm) (HCC)   . Anemia   . Ataxia   . Diabetes mellitus without complication (HCC)   . History of gallstones   . Hyperlipidemia   . Hypertension   . Vitamin D deficiency     Family History  Problem Relation Age of Onset  . Diabetes Mother   . Hip fracture Mother   . Pulmonary embolism Mother        after hip fracture  . Lupus Sister   . Scleroderma Sister   . Diabetes Brother   . Hyperlipidemia Brother   . Hypertension Brother     SOCIAL HISTORY: Social History   Socioeconomic History  . Marital status: Married    Spouse name: Not on file  . Number of children: Not on file  . Years of education: Not on file  . Highest education level: Not on file  Occupational History  . Not on file  Social Needs  . Financial resource strain: Not on file  . Food insecurity:    Worry: Not on file    Inability: Not on file  . Transportation needs:    Medical: Not on file    Non-medical: Not on file  Tobacco Use  . Smoking status: Former Smoker    Packs/day: 1.00    Years: 40.00    Pack years: 40.00    Types:  Cigarettes    Start date: 07/03/1966    Last attempt to quit: 02/06/2009    Years since quitting: 9.1  . Smokeless tobacco: Never Used  Substance and Sexual Activity  . Alcohol use: No    Alcohol/week: 0.0 standard drinks  . Drug use: No  . Sexual activity: Yes  Lifestyle  . Physical activity:    Days per week: Not on file    Minutes per session: Not on file  . Stress: Not on file  Relationships  . Social connections:    Talks on phone: Not on file    Gets together: Not on file    Attends religious service: Not on file    Active member of club or organization: Not on file    Attends meetings of clubs or organizations: Not on file    Relationship status: Not on file  . Intimate partner violence:    Fear of current or ex partner: Not on file    Emotionally abused: Not on file    Physically abused: Not on file    Forced sexual activity: Not on file  Other Topics Concern  . Not on file  Social History Narrative  . Not on file    Allergies  Allergen Reactions  . Donepezil Nausea And Vomiting  . Namenda [Memantine Hcl] Other (See Comments)    Sleeps all the time    Current Outpatient Medications  Medication Sig Dispense Refill  . Acetaminophen (TYLENOL ARTHRITIS EXT RELIEF PO) Take by mouth.    [name] aspirin 81 MG tablet Take 81 mg by mouth daily.    . citalopram (CELEXA) 10 MG tablet Take 10 mg by mouth daily.    . Dulaglutide (TRULICITY) 1.5 MG/0.5ML SOPN Inject 1.5 mg into the skin once a week. 13 pen 3  . glucose blood test strip Check fasting and 2 hours after biggest meal 200 each 3  . Insulin Glargine (LANTUS SOLOSTAR) 100 UNIT/ML Solostar Pen INJECT 45 UNITS DAILY 45 mL 1  . lisinopril (PRINIVIL,ZESTRIL) 5 MG tablet Take 1 tablet (5 mg total) by mouth daily. 90 tablet 3  . metFORMIN (GLUCOPHAGE) 1000 MG tablet Take 1 tablet (1,000 mg total) by mouth 2 (two) times daily with a meal. 180 tablet 3  . neomycin-polymyxin-hydrocortisone (CORTISPORIN) 3.5-10000-1 OTIC suspension  Place 3 drops into the left ear 4 (four) times daily. 10 mL 0  . omeprazole (PRILOSEC) 20 MG capsule Take 1 capsule (20 mg total) by mouth daily. 90 capsule 3  . rivastigmine (EXELON) 4.5 MG capsule Take 1 capsule (4.5 mg total) by mouth 2 (two) times daily. 180 capsule 3  . rOPINIRole (REQUIP) 1 MG tablet Take 1 tablet (1 mg total) by mouth 3 (three) times daily. 90 tablet 2  . SURE COMFORT PEN NEEDLES 32G X 6 MM MISC USE AS DIRECTED 100 each 3   No current facility-administered medications for this visit.     REVIEW OF SYSTEMS:  [X]  denotes positive finding, [ ]  denotes negative finding Cardiac  Comments:  Chest pain or chest pressure:    Shortness of breath upon exertion:    Short of breath when lying flat:    Irregular heart rhythm:        Vascular    Pain in calf, thigh, or hip brought on by ambulation:    Pain in feet at night that wakes you up from your sleep:     Blood clot in your veins:    Leg swelling:  x       Pulmonary    Oxygen at home:    Productive cough:     Wheezing:         Neurologic    Sudden weakness in arms or legs:     Sudden numbness in arms or legs:     Sudden onset of difficulty speaking or slurred speech:    Temporary loss of vision in one eye:     Problems with dizziness:         Gastrointestinal    Blood in stool:     Vomited blood:         Genitourinary    Burning when urinating:     Blood in urine:        Psychiatric    Major depression:         Hematologic    Bleeding problems:    Problems with blood clotting too easily:        Skin    Rashes or ulcers:        Constitutional    Fever or chills:      PHYSICAL EXAM: Vitals:   04/08/18 0924  BP: 117/61  Pulse: 75  Resp: 16  Temp: 98.6 F (37 C)  TempSrc: Temporal  Weight: 195 lb (88.5 kg)  Height: 5\' 10"  (1.778 m)    GENERAL: The patient is a well-nourished male, in no acute distress. The vital signs are documented above. CARDIOVASCULAR: Carotid arteries without  bruits.  2+ radial and 2+ posterior tibial pulses.  No evidence of popliteal artery aneurysm PULMONARY: There is good air exchange  ABDOMEN: Soft and non-tender no bruit and no aortic aneurysm palpable MUSCULOSKELETAL: There are no major deformities or cyanosis. NEUROLOGIC: No focal weakness or paresthesias are detected.  Is in a motorized scooter due to generalized weakness  sKIN: There are no ulcers or rashes noted. PSYCHIATRIC: The patient has a normal affect.  DATA:  I reviewed his studies from this morning at [facility] and discussed them at length with the patient and his wife  Carotid duplex shows approximately 50% left carotid stenosis and no significant stenosis in the right.  His velocities are lower than what had been predicted before in the [place] study  Maximal aortic diameter of 2.8 cm slightly smaller than just over 3 and his prior studies.  MEDICAL ISSUES: I discussed this at length with the patient and his wife.  Explained that he in the lowest possible threshold to even feel that he has an abdominal aortic aneurysm.  Have recommended repeat duplex in 3 years.  This had been done on a yearly basis and I certainly feel that this is not necessary.  I did discuss his moderate left carotid stenosis and reviewed symptoms with him.  He knows to report immediately to the emergency room should he have any neurologic deficits.  I have recommended yearly carotid duplex and will see him again in 1 year in our [place] office.   Larina Earthly, MD FACS [facility] Tel [phone] Pager [phone]

## 2018-04-10 DIAGNOSIS — F0280 Dementia in other diseases classified elsewhere without behavioral disturbance: Secondary | ICD-10-CM | POA: Diagnosis not present

## 2018-04-10 DIAGNOSIS — R2689 Other abnormalities of gait and mobility: Secondary | ICD-10-CM | POA: Diagnosis not present

## 2018-04-10 DIAGNOSIS — I10 Essential (primary) hypertension: Secondary | ICD-10-CM | POA: Diagnosis not present

## 2018-04-10 DIAGNOSIS — G301 Alzheimer's disease with late onset: Secondary | ICD-10-CM | POA: Diagnosis not present

## 2018-04-10 DIAGNOSIS — I714 Abdominal aortic aneurysm, without rupture: Secondary | ICD-10-CM | POA: Diagnosis not present

## 2018-04-10 DIAGNOSIS — E119 Type 2 diabetes mellitus without complications: Secondary | ICD-10-CM | POA: Diagnosis not present

## 2018-04-11 DIAGNOSIS — I714 Abdominal aortic aneurysm, without rupture: Secondary | ICD-10-CM | POA: Diagnosis not present

## 2018-04-11 DIAGNOSIS — G301 Alzheimer's disease with late onset: Secondary | ICD-10-CM | POA: Diagnosis not present

## 2018-04-11 DIAGNOSIS — R2689 Other abnormalities of gait and mobility: Secondary | ICD-10-CM | POA: Diagnosis not present

## 2018-04-11 DIAGNOSIS — E119 Type 2 diabetes mellitus without complications: Secondary | ICD-10-CM | POA: Diagnosis not present

## 2018-04-11 DIAGNOSIS — F0280 Dementia in other diseases classified elsewhere without behavioral disturbance: Secondary | ICD-10-CM | POA: Diagnosis not present

## 2018-04-11 DIAGNOSIS — I10 Essential (primary) hypertension: Secondary | ICD-10-CM | POA: Diagnosis not present

## 2018-04-12 ENCOUNTER — Ambulatory Visit: Payer: Medicare Other | Admitting: Family Medicine

## 2018-04-15 ENCOUNTER — Telehealth: Payer: Self-pay | Admitting: Family Medicine

## 2018-04-15 NOTE — Telephone Encounter (Signed)
Patients wife notified

## 2018-04-15 NOTE — Telephone Encounter (Signed)
That sounds good. If he is having low glucose symptoms they could reduce the dose to 40 units daily.

## 2018-04-15 NOTE — Telephone Encounter (Signed)
Spoke with patient's wife [name], she states blood sugar readings are 80s-100 fasting each morning, and he takes Lantus 45 units each morning.  Dr. Darlyn Read please advise on dose.

## 2018-04-15 NOTE — Telephone Encounter (Signed)
[name] has called about pt BS said it has been in the 80s to 100s and wants to know if she should lower his Insulin Glargine (LANTUS SOLOSTAR) 100 UNIT/ML Solostar Pen

## 2018-04-17 DIAGNOSIS — E119 Type 2 diabetes mellitus without complications: Secondary | ICD-10-CM | POA: Diagnosis not present

## 2018-04-17 DIAGNOSIS — I714 Abdominal aortic aneurysm, without rupture: Secondary | ICD-10-CM | POA: Diagnosis not present

## 2018-04-17 DIAGNOSIS — R2689 Other abnormalities of gait and mobility: Secondary | ICD-10-CM | POA: Diagnosis not present

## 2018-04-17 DIAGNOSIS — F0280 Dementia in other diseases classified elsewhere without behavioral disturbance: Secondary | ICD-10-CM | POA: Diagnosis not present

## 2018-04-17 DIAGNOSIS — G301 Alzheimer's disease with late onset: Secondary | ICD-10-CM | POA: Diagnosis not present

## 2018-04-17 DIAGNOSIS — I10 Essential (primary) hypertension: Secondary | ICD-10-CM | POA: Diagnosis not present

## 2018-04-19 DIAGNOSIS — R2689 Other abnormalities of gait and mobility: Secondary | ICD-10-CM | POA: Diagnosis not present

## 2018-04-19 DIAGNOSIS — E119 Type 2 diabetes mellitus without complications: Secondary | ICD-10-CM | POA: Diagnosis not present

## 2018-04-19 DIAGNOSIS — F0280 Dementia in other diseases classified elsewhere without behavioral disturbance: Secondary | ICD-10-CM | POA: Diagnosis not present

## 2018-04-19 DIAGNOSIS — I10 Essential (primary) hypertension: Secondary | ICD-10-CM | POA: Diagnosis not present

## 2018-04-19 DIAGNOSIS — I714 Abdominal aortic aneurysm, without rupture: Secondary | ICD-10-CM | POA: Diagnosis not present

## 2018-04-19 DIAGNOSIS — G301 Alzheimer's disease with late onset: Secondary | ICD-10-CM | POA: Diagnosis not present

## 2018-04-23 DIAGNOSIS — G301 Alzheimer's disease with late onset: Secondary | ICD-10-CM | POA: Diagnosis not present

## 2018-04-23 DIAGNOSIS — F0280 Dementia in other diseases classified elsewhere without behavioral disturbance: Secondary | ICD-10-CM | POA: Diagnosis not present

## 2018-04-23 DIAGNOSIS — I10 Essential (primary) hypertension: Secondary | ICD-10-CM | POA: Diagnosis not present

## 2018-04-23 DIAGNOSIS — R2689 Other abnormalities of gait and mobility: Secondary | ICD-10-CM | POA: Diagnosis not present

## 2018-04-23 DIAGNOSIS — I714 Abdominal aortic aneurysm, without rupture: Secondary | ICD-10-CM | POA: Diagnosis not present

## 2018-04-23 DIAGNOSIS — E119 Type 2 diabetes mellitus without complications: Secondary | ICD-10-CM | POA: Diagnosis not present

## 2018-04-25 ENCOUNTER — Other Ambulatory Visit: Payer: Self-pay | Admitting: Family Medicine

## 2018-04-25 DIAGNOSIS — I10 Essential (primary) hypertension: Secondary | ICD-10-CM | POA: Diagnosis not present

## 2018-04-25 DIAGNOSIS — E119 Type 2 diabetes mellitus without complications: Secondary | ICD-10-CM | POA: Diagnosis not present

## 2018-04-25 DIAGNOSIS — F0280 Dementia in other diseases classified elsewhere without behavioral disturbance: Secondary | ICD-10-CM | POA: Diagnosis not present

## 2018-04-25 DIAGNOSIS — R2689 Other abnormalities of gait and mobility: Secondary | ICD-10-CM | POA: Diagnosis not present

## 2018-04-25 DIAGNOSIS — G301 Alzheimer's disease with late onset: Secondary | ICD-10-CM | POA: Diagnosis not present

## 2018-04-25 DIAGNOSIS — I714 Abdominal aortic aneurysm, without rupture: Secondary | ICD-10-CM | POA: Diagnosis not present

## 2018-05-01 DIAGNOSIS — I714 Abdominal aortic aneurysm, without rupture: Secondary | ICD-10-CM | POA: Diagnosis not present

## 2018-05-01 DIAGNOSIS — R2689 Other abnormalities of gait and mobility: Secondary | ICD-10-CM | POA: Diagnosis not present

## 2018-05-01 DIAGNOSIS — G301 Alzheimer's disease with late onset: Secondary | ICD-10-CM | POA: Diagnosis not present

## 2018-05-01 DIAGNOSIS — F0280 Dementia in other diseases classified elsewhere without behavioral disturbance: Secondary | ICD-10-CM | POA: Diagnosis not present

## 2018-05-01 DIAGNOSIS — I10 Essential (primary) hypertension: Secondary | ICD-10-CM | POA: Diagnosis not present

## 2018-05-01 DIAGNOSIS — E119 Type 2 diabetes mellitus without complications: Secondary | ICD-10-CM | POA: Diagnosis not present

## 2018-05-02 DIAGNOSIS — E119 Type 2 diabetes mellitus without complications: Secondary | ICD-10-CM | POA: Diagnosis not present

## 2018-05-02 DIAGNOSIS — G301 Alzheimer's disease with late onset: Secondary | ICD-10-CM | POA: Diagnosis not present

## 2018-05-02 DIAGNOSIS — F0280 Dementia in other diseases classified elsewhere without behavioral disturbance: Secondary | ICD-10-CM | POA: Diagnosis not present

## 2018-05-02 DIAGNOSIS — I714 Abdominal aortic aneurysm, without rupture: Secondary | ICD-10-CM | POA: Diagnosis not present

## 2018-05-02 DIAGNOSIS — R2689 Other abnormalities of gait and mobility: Secondary | ICD-10-CM | POA: Diagnosis not present

## 2018-05-02 DIAGNOSIS — I10 Essential (primary) hypertension: Secondary | ICD-10-CM | POA: Diagnosis not present

## 2018-05-03 ENCOUNTER — Encounter: Payer: Self-pay | Admitting: Family Medicine

## 2018-05-03 ENCOUNTER — Ambulatory Visit (INDEPENDENT_AMBULATORY_CARE_PROVIDER_SITE_OTHER): Payer: Medicare Other | Admitting: Family Medicine

## 2018-05-03 VITALS — BP 110/68 | HR 79 | Temp 97.1°F | Ht 70.0 in | Wt 178.8 lb

## 2018-05-03 DIAGNOSIS — R339 Retention of urine, unspecified: Secondary | ICD-10-CM | POA: Diagnosis not present

## 2018-05-03 DIAGNOSIS — I6529 Occlusion and stenosis of unspecified carotid artery: Secondary | ICD-10-CM | POA: Diagnosis not present

## 2018-05-03 LAB — URINALYSIS
BILIRUBIN UA: NEGATIVE
GLUCOSE, UA: NEGATIVE
KETONES UA: NEGATIVE
Leukocytes, UA: NEGATIVE
Nitrite, UA: NEGATIVE
Protein, UA: NEGATIVE
SPEC GRAV UA: 1.025 (ref 1.005–1.030)
UUROB: 0.2 mg/dL (ref 0.2–1.0)
pH, UA: 5 (ref 5.0–7.5)

## 2018-05-03 NOTE — Progress Notes (Signed)
Subjective:  Patient ID: Jon [name], male    DOB: July 29, 1945  Age: 72 y.o. MRN: [id number]  CC: Urinary Retention   HPI Jon [name] presents for concern for decrease of urination. Pt. Says he goes about once a day. Doesn't drink water much. Usually soda. Denies edema. No excessive thirst. He has no problem with urgency, incontinence or dysuria.Onset over the last month. Severity is mild.   Depression screen [facility] 2/9 05/03/2018 03/12/2018 02/06/2018  Decreased Interest 0 0 0  Down, Depressed, Hopeless 0 0 0  PHQ - 2 Score 0 0 0  Altered sleeping - - -  Tired, decreased energy - - -  Change in appetite - - -  Feeling bad or failure about yourself  - - -  Trouble concentrating - - -  Moving slowly or fidgety/restless - - -  Suicidal thoughts - - -  PHQ-9 Score - - -    History Jon [name] has a past medical history of AAA (abdominal aortic aneurysm) ([place]), Anemia, Ataxia, Diabetes mellitus without complication ([place]), History of gallstones, Hyperlipidemia, Hypertension, and Vitamin D deficiency.   He has a past surgical history that includes ERCP; Inguinal hernia repair; and Tonsillectomy.   His family history includes Diabetes in his brother and mother; Hip fracture in his mother; Hyperlipidemia in his brother; Hypertension in his brother; Lupus in his sister; Pulmonary embolism in his mother; Scleroderma in his sister.He reports that he quit smoking about 9 years ago. His smoking use included cigarettes. He started smoking about 51 years ago. He has a 40.00 pack-year smoking history. He has never used smokeless tobacco. He reports that he does not drink alcohol or use drugs.    ROS Review of Systems  Constitutional: Negative for fever.  Respiratory: Negative for shortness of breath.   Cardiovascular: Negative for chest pain.  Genitourinary: Positive for decreased urine volume and difficulty urinating. Negative for dysuria, enuresis, flank pain, frequency, hematuria and urgency.    Musculoskeletal: Negative for arthralgias.  Skin: Negative for rash.    Objective:  BP 110/68   Pulse 79   Temp (!) 97.1 F (36.2 C) (Oral)   Ht _0  (1.778 m)   Wt 178 lb 12.8 oz (81.1 kg)   BMI 25.66 kg/m   BP Readings from Last 3 Encounters:  05/03/18 110/68  04/08/18 117/61  03/12/18 (!) 93/52    Wt Readings from Last 3 Encounters:  05/03/18 178 lb 12.8 oz (81.1 kg)  04/08/18 195 lb (88.5 kg)  03/12/18 183 lb 4 oz (83.1 kg)     Physical Exam  Constitutional: He appears well-developed and well-nourished.  HENT:  Head: Normocephalic and atraumatic.  Right Ear: Tympanic membrane and external ear normal. No decreased hearing is noted.  Left Ear: Tympanic membrane and external ear normal. No decreased hearing is noted.  Mouth/Throat: No oropharyngeal exudate or posterior oropharyngeal erythema.  Eyes: Pupils are equal, round, and reactive to light.  Neck: Normal range of motion. Neck supple.  Cardiovascular: Normal rate and regular rhythm.  No murmur heard. Pulmonary/Chest: Breath sounds normal. No respiratory distress.  Abdominal: Soft. Bowel sounds are normal. He exhibits no mass. There is no tenderness.  Vitals reviewed.     Assessment & Plan:   Jon [name] was seen today for urinary retention.  Diagnoses and all orders for this visit:  Urinary retention -     Cancel: Urinalysis, Complete -     CMP14+EGFR -     Urine Culture -  Urinalysis       I am having Jon [name] maintain his aspirin, neomycin-polymyxin-hydrocortisone, Acetaminophen (TYLENOL ARTHRITIS EXT RELIEF PO), omeprazole, Dulaglutide, glucose blood, rivastigmine, lisinopril, metFORMIN, citalopram, rOPINIRole, SURE COMFORT PEN NEEDLES, and Insulin Glargine.  Allergies as of 05/03/2018      Reactions   Donepezil Nausea And Vomiting   Namenda [memantine Hcl] Other (See Comments)   Sleeps all the time      Medication List        Accurate as of 05/03/18  9:44 PM. Always use your most  recent med list.          aspirin 81 MG tablet Take 81 mg by mouth daily.   citalopram 10 MG tablet Commonly known as:  CELEXA Take 10 mg by mouth daily.   Dulaglutide 1.5 MG/0.5ML Sopn Inject 1.5 mg into the skin once a week.   glucose blood test strip Check fasting and 2 hours after biggest meal   Insulin Glargine 100 UNIT/ML Solostar Pen Commonly known as:  LANTUS INJECT 45 UNITS DAILY   lisinopril 5 MG tablet Commonly known as:  PRINIVIL,ZESTRIL Take 1 tablet (5 mg total) by mouth daily.   metFORMIN 1000 MG tablet Commonly known as:  GLUCOPHAGE Take 1 tablet (1,000 mg total) by mouth 2 (two) times daily with a meal.   neomycin-polymyxin-hydrocortisone 3.5-10000-1 OTIC suspension Commonly known as:  CORTISPORIN Place 3 drops into the left ear 4 (four) times daily.   omeprazole 20 MG capsule Commonly known as:  PRILOSEC Take 1 capsule (20 mg total) by mouth daily.   rivastigmine 4.5 MG capsule Commonly known as:  EXELON Take 1 capsule (4.5 mg total) by mouth 2 (two) times daily.   rOPINIRole 1 MG tablet Commonly known as:  REQUIP Take 1 tablet (1 mg total) by mouth 3 (three) times daily.   SURE COMFORT PEN NEEDLES 32G X 6 MM Misc Generic drug:  Insulin Pen Needle USE AS DIRECTED   TYLENOL ARTHRITIS EXT RELIEF PO Take by mouth.        Follow-up: Return in about 3 months (around 08/03/2018).  Claretta Fraise, M.D.

## 2018-05-03 NOTE — Patient Instructions (Signed)
Drink 64 ounces of WATER daily

## 2018-05-04 LAB — CMP14+EGFR
ALK PHOS: 153 IU/L — AB (ref 39–117)
ALT: 53 IU/L — AB (ref 0–44)
AST: 41 IU/L — ABNORMAL HIGH (ref 0–40)
Albumin/Globulin Ratio: 1.5 (ref 1.2–2.2)
Albumin: 4.5 g/dL (ref 3.5–4.8)
BUN/Creatinine Ratio: 17 (ref 10–24)
BUN: 15 mg/dL (ref 8–27)
Bilirubin Total: 0.4 mg/dL (ref 0.0–1.2)
CALCIUM: 9.8 mg/dL (ref 8.6–10.2)
CO2: 17 mmol/L — AB (ref 20–29)
CREATININE: 0.86 mg/dL (ref 0.76–1.27)
Chloride: 98 mmol/L (ref 96–106)
GFR calc Af Amer: 102 mL/min/{1.73_m2} (ref >59)
GFR, EST NON AFRICAN AMERICAN: 88 mL/min/{1.73_m2} (ref >59)
GLUCOSE: 95 mg/dL (ref 65–99)
Globulin, Total: 3.1 g/dL (ref 1.5–4.5)
Potassium: 4.6 mmol/L (ref 3.5–5.2)
SODIUM: 134 mmol/L (ref 134–144)
Total Protein: 7.6 g/dL (ref 6.0–8.5)

## 2018-05-04 LAB — URINE CULTURE

## 2018-05-05 NOTE — Progress Notes (Signed)
Hello Tramain,  Your lab result is normal.Some minor variations that are not significant are commonly marked abnormal, but do not represent any medical problem for you.  Best regards, Mechele Claude, M.D.

## 2018-06-11 ENCOUNTER — Other Ambulatory Visit: Payer: Self-pay | Admitting: Family Medicine

## 2018-06-18 ENCOUNTER — Ambulatory Visit: Payer: Medicare Other | Admitting: Family Medicine

## 2018-07-15 ENCOUNTER — Ambulatory Visit: Payer: Medicare Other | Admitting: Family Medicine

## 2018-07-21 ENCOUNTER — Other Ambulatory Visit: Payer: Self-pay | Admitting: Family Medicine

## 2018-07-26 ENCOUNTER — Telehealth: Payer: Self-pay | Admitting: Family Medicine

## 2018-07-26 NOTE — Telephone Encounter (Signed)
Pt's wife states she didn't give him his Trulicity this past Monday because his FBS have been 78 and low 80's all week. He is still doing Lantus 45 units daily and Metformin 1000mg  BID. She is aware you are out of office today and will be back Monday. I told her I would call her back Monday and to just hold on the Trulicity until I call her once you have addressed.

## 2018-07-28 NOTE — Telephone Encounter (Signed)
Please contact the patient : Hold off on trulicity

## 2018-07-29 NOTE — Telephone Encounter (Signed)
Patient wife aware

## 2018-07-31 ENCOUNTER — Encounter: Payer: Self-pay | Admitting: Family Medicine

## 2018-07-31 NOTE — Progress Notes (Signed)
I was contacted by patient's family member to let me know that he has been having nausea without vomiting today. Blood sugars were noted to be in the 200s. He's denying any abdominal pain or fevers. He's keeping fluids down. His family member voiced his concerns with regard to his blood sugar because it had been in the 70 to 80s (he was asymptomatic) with Trulicity. This was stopped about two weeks ago because he had been doing well wit h sugars. She wonders if perhaps he needs to go back on a medication. We discussed it in the absence of fever and abdominal pain nausea unlikely to be from pancreatitis. Furthermore, given blood sugars in the 200s unlikely to be DKA or HHS. I've recommended hydration with clear fluids and seeking medical attention in the emergency department if he is unable to keep fluid down. Call office in am for appointment for sugar management.  She  voiced good understanding and appreciation for advice.

## 2018-08-02 ENCOUNTER — Encounter: Payer: Self-pay | Admitting: Family Medicine

## 2018-08-02 ENCOUNTER — Ambulatory Visit (INDEPENDENT_AMBULATORY_CARE_PROVIDER_SITE_OTHER): Payer: Medicare Other | Admitting: Family Medicine

## 2018-08-02 VITALS — BP 124/71 | HR 55 | Temp 97.4°F | Ht 70.0 in

## 2018-08-02 DIAGNOSIS — K529 Noninfective gastroenteritis and colitis, unspecified: Secondary | ICD-10-CM

## 2018-08-02 MED ORDER — ONDANSETRON 8 MG PO TBDP: 8.0000 mg | ORAL_TABLET | Freq: Four times a day (QID) | ORAL | 1 refills | Status: DC | PRN

## 2018-08-02 NOTE — Progress Notes (Signed)
Chief Complaint  Patient presents with  . Emesis    pt here today c/o vomiting for the past 3 days and hasn't been able to keep any food or fluids down for 48 hours    HPI  Patient presents today for ongoing nausea and vomiting.  This been going on for 3 days.  He has been rather listless.  Not getting any fluids in for the last 24 to 48 hours.  Wife says he has not urinated to her knowledge so far today by noon.  Bowel movements have been normal.  There is been no diarrhea.  No appetite.  No URI symptoms including cough congestion sore throat.  No fever chills or sweats.  PMH: Smoking status noted ROS: Per HPI  Objective: BP 124/71   Pulse (!) 55   Temp (!) 97.4 F (36.3 C) (Oral)   Ht 5\' 10"  (1.778 m)   BMI 25.66 kg/m  Gen: NAD, alert, but listless.  Cooperative with exam HEENT: NCAT, EOMI, PERRL.  Eyes are moist from normal tearing.  The mucous membranes are moist.   CV: RRR, good S1/S2, no murmur Resp: CTABL, no wheezes, non-labored Abd: SNTND, BS present, no guarding or organomegaly Ext: No edema, warm Neuro: Alert and oriented, No gross deficits  Assessment and plan:  1. Gastroenteritis, acute     Meds ordered this encounter  Medications  . ondansetron (ZOFRAN-ODT) 8 MG disintegrating tablet    Sig: Take 1 tablet (8 mg total) by mouth every 6 (six) hours as needed for nausea or vomiting.    Dispense:  20 tablet    Refill:  1    No orders of the defined types were placed in this encounter.   Follow up as needed.  Mechele Claude, MD

## 2018-08-03 DIAGNOSIS — R531 Weakness: Secondary | ICD-10-CM | POA: Diagnosis not present

## 2018-08-03 DIAGNOSIS — J984 Other disorders of lung: Secondary | ICD-10-CM | POA: Diagnosis not present

## 2018-08-03 DIAGNOSIS — R1111 Vomiting without nausea: Secondary | ICD-10-CM | POA: Diagnosis not present

## 2018-08-03 DIAGNOSIS — J439 Emphysema, unspecified: Secondary | ICD-10-CM | POA: Diagnosis not present

## 2018-08-03 DIAGNOSIS — N281 Cyst of kidney, acquired: Secondary | ICD-10-CM | POA: Diagnosis not present

## 2018-08-03 DIAGNOSIS — I714 Abdominal aortic aneurysm, without rupture: Secondary | ICD-10-CM | POA: Diagnosis not present

## 2018-08-03 DIAGNOSIS — Z794 Long term (current) use of insulin: Secondary | ICD-10-CM | POA: Diagnosis not present

## 2018-08-03 DIAGNOSIS — M5136 Other intervertebral disc degeneration, lumbar region: Secondary | ICD-10-CM | POA: Diagnosis not present

## 2018-08-03 DIAGNOSIS — I739 Peripheral vascular disease, unspecified: Secondary | ICD-10-CM | POA: Diagnosis not present

## 2018-08-03 DIAGNOSIS — R110 Nausea: Secondary | ICD-10-CM | POA: Diagnosis not present

## 2018-08-03 DIAGNOSIS — Z87891 Personal history of nicotine dependence: Secondary | ICD-10-CM | POA: Diagnosis not present

## 2018-08-03 DIAGNOSIS — K5790 Diverticulosis of intestine, part unspecified, without perforation or abscess without bleeding: Secondary | ICD-10-CM | POA: Diagnosis not present

## 2018-08-03 DIAGNOSIS — E1140 Type 2 diabetes mellitus with diabetic neuropathy, unspecified: Secondary | ICD-10-CM | POA: Diagnosis not present

## 2018-08-03 DIAGNOSIS — R1110 Vomiting, unspecified: Secondary | ICD-10-CM | POA: Insufficient documentation

## 2018-08-03 DIAGNOSIS — E860 Dehydration: Secondary | ICD-10-CM | POA: Diagnosis not present

## 2018-08-03 DIAGNOSIS — S32000D Wedge compression fracture of unspecified lumbar vertebra, subsequent encounter for fracture with routine healing: Secondary | ICD-10-CM | POA: Diagnosis not present

## 2018-08-03 DIAGNOSIS — R112 Nausea with vomiting, unspecified: Secondary | ICD-10-CM | POA: Diagnosis not present

## 2018-08-03 DIAGNOSIS — Z87898 Personal history of other specified conditions: Secondary | ICD-10-CM | POA: Diagnosis not present

## 2018-08-03 DIAGNOSIS — R269 Unspecified abnormalities of gait and mobility: Secondary | ICD-10-CM | POA: Diagnosis not present

## 2018-08-03 DIAGNOSIS — G3184 Mild cognitive impairment, so stated: Secondary | ICD-10-CM | POA: Diagnosis not present

## 2018-08-03 DIAGNOSIS — I10 Essential (primary) hypertension: Secondary | ICD-10-CM | POA: Diagnosis not present

## 2018-08-03 DIAGNOSIS — J849 Interstitial pulmonary disease, unspecified: Secondary | ICD-10-CM | POA: Diagnosis not present

## 2018-08-03 DIAGNOSIS — Z7982 Long term (current) use of aspirin: Secondary | ICD-10-CM | POA: Diagnosis not present

## 2018-08-03 DIAGNOSIS — R52 Pain, unspecified: Secondary | ICD-10-CM | POA: Diagnosis not present

## 2018-08-03 DIAGNOSIS — K8020 Calculus of gallbladder without cholecystitis without obstruction: Secondary | ICD-10-CM | POA: Diagnosis not present

## 2018-08-03 DIAGNOSIS — E119 Type 2 diabetes mellitus without complications: Secondary | ICD-10-CM | POA: Diagnosis not present

## 2018-08-03 DIAGNOSIS — Z888 Allergy status to other drugs, medicaments and biological substances status: Secondary | ICD-10-CM | POA: Diagnosis not present

## 2018-08-05 ENCOUNTER — Ambulatory Visit: Payer: Medicare Other | Admitting: Family Medicine

## 2018-08-05 ENCOUNTER — Encounter: Payer: Self-pay | Admitting: Family Medicine

## 2018-08-06 ENCOUNTER — Telehealth: Payer: Self-pay | Admitting: Family Medicine

## 2018-08-06 DIAGNOSIS — F0280 Dementia in other diseases classified elsewhere without behavioral disturbance: Secondary | ICD-10-CM | POA: Diagnosis not present

## 2018-08-06 DIAGNOSIS — R2689 Other abnormalities of gait and mobility: Secondary | ICD-10-CM | POA: Diagnosis not present

## 2018-08-06 DIAGNOSIS — R112 Nausea with vomiting, unspecified: Secondary | ICD-10-CM | POA: Diagnosis not present

## 2018-08-06 DIAGNOSIS — Z87442 Personal history of urinary calculi: Secondary | ICD-10-CM | POA: Diagnosis not present

## 2018-08-06 DIAGNOSIS — E119 Type 2 diabetes mellitus without complications: Secondary | ICD-10-CM | POA: Diagnosis not present

## 2018-08-06 DIAGNOSIS — R945 Abnormal results of liver function studies: Secondary | ICD-10-CM | POA: Diagnosis not present

## 2018-08-06 DIAGNOSIS — R269 Unspecified abnormalities of gait and mobility: Secondary | ICD-10-CM | POA: Diagnosis present

## 2018-08-06 DIAGNOSIS — K819 Cholecystitis, unspecified: Secondary | ICD-10-CM | POA: Diagnosis not present

## 2018-08-06 DIAGNOSIS — Z5329 Procedure and treatment not carried out because of patient's decision for other reasons: Secondary | ICD-10-CM | POA: Diagnosis not present

## 2018-08-06 DIAGNOSIS — I2510 Atherosclerotic heart disease of native coronary artery without angina pectoris: Secondary | ICD-10-CM | POA: Diagnosis present

## 2018-08-06 DIAGNOSIS — R1110 Vomiting, unspecified: Secondary | ICD-10-CM | POA: Diagnosis not present

## 2018-08-06 DIAGNOSIS — G309 Alzheimer's disease, unspecified: Secondary | ICD-10-CM | POA: Diagnosis not present

## 2018-08-06 DIAGNOSIS — Z79899 Other long term (current) drug therapy: Secondary | ICD-10-CM | POA: Diagnosis not present

## 2018-08-06 DIAGNOSIS — J449 Chronic obstructive pulmonary disease, unspecified: Secondary | ICD-10-CM | POA: Diagnosis present

## 2018-08-06 DIAGNOSIS — K219 Gastro-esophageal reflux disease without esophagitis: Secondary | ICD-10-CM | POA: Diagnosis present

## 2018-08-06 DIAGNOSIS — R001 Bradycardia, unspecified: Secondary | ICD-10-CM | POA: Diagnosis not present

## 2018-08-06 DIAGNOSIS — K8020 Calculus of gallbladder without cholecystitis without obstruction: Secondary | ICD-10-CM | POA: Diagnosis not present

## 2018-08-06 DIAGNOSIS — Z87891 Personal history of nicotine dependence: Secondary | ICD-10-CM | POA: Diagnosis not present

## 2018-08-06 DIAGNOSIS — R7989 Other specified abnormal findings of blood chemistry: Secondary | ICD-10-CM | POA: Diagnosis not present

## 2018-08-06 DIAGNOSIS — Z794 Long term (current) use of insulin: Secondary | ICD-10-CM | POA: Diagnosis not present

## 2018-08-06 DIAGNOSIS — Z993 Dependence on wheelchair: Secondary | ICD-10-CM | POA: Diagnosis not present

## 2018-08-06 DIAGNOSIS — Z7982 Long term (current) use of aspirin: Secondary | ICD-10-CM | POA: Diagnosis not present

## 2018-08-06 NOTE — Telephone Encounter (Signed)
Pt did well yesterday Ate some and then GI upset is back again.  No regular meds since last TUES. Not wanting to get up.  Getting dizzy with standing.  Went to ER SAT - had CT   Prefers to see STACKS only - no available appt = what do you suggest?

## 2018-08-06 NOTE — Telephone Encounter (Signed)
Pt wife states that he is throwing up the gatoraid and his urine is very dark in color.

## 2018-08-06 NOTE — Telephone Encounter (Signed)
Patient and wife verbalized understanding and states he will go back to the ED

## 2018-08-06 NOTE — Telephone Encounter (Signed)
If he is actively vomiting in spite of using ondansetron, he needs to go back to the hospital. [facility]

## 2018-08-09 ENCOUNTER — Encounter: Payer: Self-pay | Admitting: Family Medicine

## 2018-08-12 ENCOUNTER — Other Ambulatory Visit: Payer: Self-pay

## 2018-08-12 ENCOUNTER — Emergency Department (HOSPITAL_COMMUNITY): Payer: Medicare Other

## 2018-08-12 ENCOUNTER — Encounter (HOSPITAL_COMMUNITY): Payer: Self-pay

## 2018-08-12 ENCOUNTER — Inpatient Hospital Stay (HOSPITAL_COMMUNITY)
Admission: EM | Admit: 2018-08-12 | Discharge: 2018-08-21 | DRG: 194 | Disposition: A | Discharge status: Skilled Nursing Facility | Payer: Medicare Other | Attending: Family Medicine | Admitting: Family Medicine

## 2018-08-12 DIAGNOSIS — R531 Weakness: Secondary | ICD-10-CM | POA: Diagnosis not present

## 2018-08-12 DIAGNOSIS — E860 Dehydration: Secondary | ICD-10-CM | POA: Diagnosis present

## 2018-08-12 DIAGNOSIS — Z832 Family history of diseases of the blood and blood-forming organs and certain disorders involving the immune mechanism: Secondary | ICD-10-CM

## 2018-08-12 DIAGNOSIS — E785 Hyperlipidemia, unspecified: Secondary | ICD-10-CM | POA: Diagnosis present

## 2018-08-12 DIAGNOSIS — K8020 Calculus of gallbladder without cholecystitis without obstruction: Secondary | ICD-10-CM | POA: Diagnosis not present

## 2018-08-12 DIAGNOSIS — E1165 Type 2 diabetes mellitus with hyperglycemia: Secondary | ICD-10-CM | POA: Diagnosis not present

## 2018-08-12 DIAGNOSIS — R945 Abnormal results of liver function studies: Secondary | ICD-10-CM

## 2018-08-12 DIAGNOSIS — F0281 Dementia in other diseases classified elsewhere with behavioral disturbance: Secondary | ICD-10-CM | POA: Diagnosis present

## 2018-08-12 DIAGNOSIS — E782 Mixed hyperlipidemia: Secondary | ICD-10-CM

## 2018-08-12 DIAGNOSIS — G309 Alzheimer's disease, unspecified: Secondary | ICD-10-CM | POA: Diagnosis present

## 2018-08-12 DIAGNOSIS — B3749 Other urogenital candidiasis: Secondary | ICD-10-CM | POA: Diagnosis not present

## 2018-08-12 DIAGNOSIS — K828 Other specified diseases of gallbladder: Secondary | ICD-10-CM | POA: Diagnosis present

## 2018-08-12 DIAGNOSIS — Z794 Long term (current) use of insulin: Secondary | ICD-10-CM

## 2018-08-12 DIAGNOSIS — I10 Essential (primary) hypertension: Secondary | ICD-10-CM

## 2018-08-12 DIAGNOSIS — R110 Nausea: Secondary | ICD-10-CM | POA: Diagnosis not present

## 2018-08-12 DIAGNOSIS — F02818 Dementia in other diseases classified elsewhere, unspecified severity, with other behavioral disturbance: Secondary | ICD-10-CM | POA: Diagnosis present

## 2018-08-12 DIAGNOSIS — I714 Abdominal aortic aneurysm, without rupture, unspecified: Secondary | ICD-10-CM | POA: Diagnosis present

## 2018-08-12 DIAGNOSIS — R112 Nausea with vomiting, unspecified: Secondary | ICD-10-CM | POA: Diagnosis not present

## 2018-08-12 DIAGNOSIS — Z833 Family history of diabetes mellitus: Secondary | ICD-10-CM

## 2018-08-12 DIAGNOSIS — Z87891 Personal history of nicotine dependence: Secondary | ICD-10-CM | POA: Diagnosis not present

## 2018-08-12 DIAGNOSIS — E876 Hypokalemia: Secondary | ICD-10-CM | POA: Diagnosis not present

## 2018-08-12 DIAGNOSIS — R748 Abnormal levels of other serum enzymes: Secondary | ICD-10-CM | POA: Diagnosis not present

## 2018-08-12 DIAGNOSIS — J101 Influenza due to other identified influenza virus with other respiratory manifestations: Secondary | ICD-10-CM | POA: Diagnosis not present

## 2018-08-12 DIAGNOSIS — F0390 Unspecified dementia without behavioral disturbance: Secondary | ICD-10-CM | POA: Diagnosis not present

## 2018-08-12 DIAGNOSIS — Z8349 Family history of other endocrine, nutritional and metabolic diseases: Secondary | ICD-10-CM

## 2018-08-12 DIAGNOSIS — I6529 Occlusion and stenosis of unspecified carotid artery: Secondary | ICD-10-CM | POA: Diagnosis present

## 2018-08-12 DIAGNOSIS — Z9181 History of falling: Secondary | ICD-10-CM | POA: Diagnosis not present

## 2018-08-12 DIAGNOSIS — F0391 Unspecified dementia with behavioral disturbance: Secondary | ICD-10-CM

## 2018-08-12 DIAGNOSIS — E119 Type 2 diabetes mellitus without complications: Secondary | ICD-10-CM

## 2018-08-12 DIAGNOSIS — G3183 Dementia with Lewy bodies: Secondary | ICD-10-CM | POA: Diagnosis present

## 2018-08-12 DIAGNOSIS — Z8249 Family history of ischemic heart disease and other diseases of the circulatory system: Secondary | ICD-10-CM

## 2018-08-12 DIAGNOSIS — J1001 Influenza due to other identified influenza virus with the same other identified influenza virus pneumonia: Secondary | ICD-10-CM | POA: Diagnosis not present

## 2018-08-12 DIAGNOSIS — R7989 Other specified abnormal findings of blood chemistry: Secondary | ICD-10-CM | POA: Diagnosis present

## 2018-08-12 DIAGNOSIS — R0902 Hypoxemia: Secondary | ICD-10-CM | POA: Diagnosis not present

## 2018-08-12 DIAGNOSIS — J09X1 Influenza due to identified novel influenza A virus with pneumonia: Secondary | ICD-10-CM | POA: Diagnosis present

## 2018-08-12 DIAGNOSIS — Z7401 Bed confinement status: Secondary | ICD-10-CM | POA: Diagnosis not present

## 2018-08-12 DIAGNOSIS — R740 Nonspecific elevation of levels of transaminase and lactic acid dehydrogenase [LDH]: Secondary | ICD-10-CM | POA: Diagnosis present

## 2018-08-12 DIAGNOSIS — K769 Liver disease, unspecified: Secondary | ICD-10-CM | POA: Diagnosis not present

## 2018-08-12 DIAGNOSIS — E118 Type 2 diabetes mellitus with unspecified complications: Secondary | ICD-10-CM | POA: Diagnosis not present

## 2018-08-12 DIAGNOSIS — J849 Interstitial pulmonary disease, unspecified: Secondary | ICD-10-CM | POA: Diagnosis not present

## 2018-08-12 DIAGNOSIS — Z049 Encounter for examination and observation for unspecified reason: Secondary | ICD-10-CM | POA: Diagnosis not present

## 2018-08-12 DIAGNOSIS — R509 Fever, unspecified: Secondary | ICD-10-CM | POA: Diagnosis not present

## 2018-08-12 DIAGNOSIS — M6281 Muscle weakness (generalized): Secondary | ICD-10-CM | POA: Diagnosis present

## 2018-08-12 HISTORY — DX: Alzheimer's disease, unspecified: G30.9

## 2018-08-12 HISTORY — DX: Dementia in other diseases classified elsewhere, unspecified severity, without behavioral disturbance, psychotic disturbance, mood disturbance, and anxiety: F02.80

## 2018-08-12 LAB — CBC WITH DIFFERENTIAL/PLATELET
ABS IMMATURE GRANULOCYTES: 0.08 10*3/uL — AB (ref 0.00–0.07)
Basophils Absolute: 0 10*3/uL (ref 0.0–0.1)
Basophils Relative: 0 %
EOS PCT: 0 %
Eosinophils Absolute: 0 10*3/uL (ref 0.0–0.5)
HCT: 50.8 % (ref 39.0–52.0)
HEMOGLOBIN: 17.1 g/dL — AB (ref 13.0–17.0)
Immature Granulocytes: 1 %
LYMPHS PCT: 10 %
Lymphs Abs: 1.4 10*3/uL (ref 0.7–4.0)
MCH: 30.7 pg (ref 26.0–34.0)
MCHC: 33.7 g/dL (ref 30.0–36.0)
MCV: 91.2 fL (ref 80.0–100.0)
MONO ABS: 1.4 10*3/uL — AB (ref 0.1–1.0)
MONOS PCT: 10 %
NEUTROS ABS: 11.3 10*3/uL — AB (ref 1.7–7.7)
Neutrophils Relative %: 79 %
Platelets: 168 10*3/uL (ref 150–400)
RBC: 5.57 MIL/uL (ref 4.22–5.81)
RDW: 12.9 % (ref 11.5–15.5)
WBC: 14.2 10*3/uL — AB (ref 4.0–10.5)
nRBC: 0 % (ref 0.0–0.2)

## 2018-08-12 LAB — HEMOGLOBIN A1C
Hgb A1c MFr Bld: 6.4 % — ABNORMAL HIGH (ref 4.8–5.6)
Mean Plasma Glucose: 136.98 mg/dL

## 2018-08-12 LAB — COMPREHENSIVE METABOLIC PANEL
ALT: 92 U/L — AB (ref 0–44)
ANION GAP: 12 (ref 5–15)
AST: 55 U/L — ABNORMAL HIGH (ref 15–41)
Albumin: 3.9 g/dL (ref 3.5–5.0)
Alkaline Phosphatase: 207 U/L — ABNORMAL HIGH (ref 38–126)
BUN: 14 mg/dL (ref 8–23)
CHLORIDE: 97 mmol/L — AB (ref 98–111)
CO2: 24 mmol/L (ref 22–32)
CREATININE: 0.97 mg/dL (ref 0.61–1.24)
Calcium: 9.1 mg/dL (ref 8.9–10.3)
GFR calc non Af Amer: >60 mL/min (ref >60)
GLUCOSE: 178 mg/dL — AB (ref 70–99)
Potassium: 4 mmol/L (ref 3.5–5.1)
SODIUM: 133 mmol/L — AB (ref 135–145)
Total Bilirubin: 1.5 mg/dL — ABNORMAL HIGH (ref 0.3–1.2)
Total Protein: 8.1 g/dL (ref 6.5–8.1)

## 2018-08-12 LAB — GROUP A STREP BY PCR: Group A Strep by PCR: NOT DETECTED

## 2018-08-12 LAB — URINALYSIS, MICROSCOPIC (REFLEX)
BACTERIA UA: NONE SEEN
SQUAMOUS EPITHELIAL / LPF: NONE SEEN (ref 0–5)
WBC UA: NONE SEEN WBC/hpf (ref 0–5)

## 2018-08-12 LAB — LIPASE, BLOOD: Lipase: 30 U/L (ref 11–51)

## 2018-08-12 LAB — URINALYSIS, ROUTINE W REFLEX MICROSCOPIC
Bilirubin Urine: NEGATIVE
GLUCOSE, UA: 250 mg/dL — AB
HGB URINE DIPSTICK: NEGATIVE
Ketones, ur: 15 mg/dL — AB
Leukocytes, UA: NEGATIVE
Nitrite: NEGATIVE
Protein, ur: 30 mg/dL — AB
SPECIFIC GRAVITY, URINE: 1.02 (ref 1.005–1.030)
pH: 6.5 (ref 5.0–8.0)

## 2018-08-12 LAB — INFLUENZA PANEL BY PCR (TYPE A & B)
Influenza A By PCR: POSITIVE — AB
Influenza B By PCR: NEGATIVE

## 2018-08-12 LAB — TROPONIN I: Troponin I: <0.03 ng/mL (ref <0.03)

## 2018-08-12 MED ORDER — CITALOPRAM HYDROBROMIDE 20 MG PO TABS
10.0000 mg | ORAL_TABLET | Freq: Every day | ORAL | Status: DC
  Administered 2018-08-12 – 2018-08-21 (×10): 10 mg via ORAL
  Filled 2018-08-12 (×12): qty 1

## 2018-08-12 MED ORDER — SODIUM CHLORIDE 0.9 % IV SOLN
INTRAVENOUS | Status: DC
  Administered 2018-08-12 – 2018-08-19 (×11): via INTRAVENOUS

## 2018-08-12 MED ORDER — PROMETHAZINE HCL 25 MG/ML IJ SOLN
12.5000 mg | Freq: Four times a day (QID) | INTRAMUSCULAR | Status: DC | PRN
  Administered 2018-08-12 – 2018-08-18 (×5): 12.5 mg via INTRAVENOUS
  Filled 2018-08-12 (×5): qty 1

## 2018-08-12 MED ORDER — OSELTAMIVIR PHOSPHATE 75 MG PO CAPS
75.0000 mg | ORAL_CAPSULE | Freq: Two times a day (BID) | ORAL | Status: AC
  Administered 2018-08-12 – 2018-08-17 (×10): 75 mg via ORAL
  Filled 2018-08-12 (×10): qty 1

## 2018-08-12 MED ORDER — ONDANSETRON HCL 4 MG/2ML IJ SOLN
4.0000 mg | INTRAMUSCULAR | Status: AC | PRN
  Administered 2018-08-12 (×2): 4 mg via INTRAVENOUS
  Filled 2018-08-12 (×2): qty 2

## 2018-08-12 MED ORDER — SODIUM CHLORIDE 0.9 % IV SOLN
3.0000 g | Freq: Three times a day (TID) | INTRAVENOUS | Status: DC
  Administered 2018-08-12 – 2018-08-19 (×20): 3 g via INTRAVENOUS
  Filled 2018-08-12 (×25): qty 3

## 2018-08-12 MED ORDER — PROCHLORPERAZINE EDISYLATE 10 MG/2ML IJ SOLN: 10.0000 mg | Freq: Four times a day (QID) | INTRAMUSCULAR | Status: DC | PRN

## 2018-08-12 MED ORDER — INSULIN ASPART 100 UNIT/ML ~~LOC~~ SOLN
0.0000 [IU] | Freq: Every day | SUBCUTANEOUS | Status: DC
  Administered 2018-08-16: 2 [IU] via SUBCUTANEOUS

## 2018-08-12 MED ORDER — FAMOTIDINE IN NACL 20-0.9 MG/50ML-% IV SOLN
20.0000 mg | Freq: Once | INTRAVENOUS | Status: AC
  Administered 2018-08-12: 20 mg via INTRAVENOUS
  Filled 2018-08-12: qty 50

## 2018-08-12 MED ORDER — OSELTAMIVIR PHOSPHATE 30 MG PO CAPS: 30.0000 mg | ORAL_CAPSULE | Freq: Two times a day (BID) | ORAL | Status: DC

## 2018-08-12 MED ORDER — INSULIN ASPART 100 UNIT/ML ~~LOC~~ SOLN
0.0000 [IU] | Freq: Three times a day (TID) | SUBCUTANEOUS | Status: DC
  Administered 2018-08-13: 2 [IU] via SUBCUTANEOUS
  Administered 2018-08-14: 3 [IU] via SUBCUTANEOUS
  Administered 2018-08-14: 1 [IU] via SUBCUTANEOUS
  Administered 2018-08-16 (×2): 2 [IU] via SUBCUTANEOUS
  Administered 2018-08-17 (×2): 3 [IU] via SUBCUTANEOUS
  Administered 2018-08-17 – 2018-08-18 (×3): 2 [IU] via SUBCUTANEOUS
  Administered 2018-08-19 – 2018-08-20 (×4): 1 [IU] via SUBCUTANEOUS
  Administered 2018-08-20: 3 [IU] via SUBCUTANEOUS
  Administered 2018-08-20: 1 [IU] via SUBCUTANEOUS
  Administered 2018-08-21: 2 [IU] via SUBCUTANEOUS

## 2018-08-12 MED ORDER — HEPARIN SODIUM (PORCINE) 5000 UNIT/ML IJ SOLN
5000.0000 [IU] | Freq: Three times a day (TID) | INTRAMUSCULAR | Status: DC
  Administered 2018-08-12 – 2018-08-20 (×23): 5000 [IU] via SUBCUTANEOUS
  Filled 2018-08-12 (×22): qty 1

## 2018-08-12 MED ORDER — MORPHINE SULFATE (PF) 2 MG/ML IV SOLN: 1.0000 mg | INTRAVENOUS | Status: DC | PRN

## 2018-08-12 MED ORDER — ACETAMINOPHEN 650 MG RE SUPP: 650.0000 mg | Freq: Four times a day (QID) | RECTAL | Status: DC | PRN

## 2018-08-12 MED ORDER — PANTOPRAZOLE SODIUM 40 MG PO TBEC
40.0000 mg | DELAYED_RELEASE_TABLET | Freq: Two times a day (BID) | ORAL | Status: DC
  Administered 2018-08-12 – 2018-08-21 (×18): 40 mg via ORAL
  Filled 2018-08-12 (×18): qty 1

## 2018-08-12 MED ORDER — ACETAMINOPHEN 325 MG PO TABS
650.0000 mg | ORAL_TABLET | Freq: Four times a day (QID) | ORAL | Status: DC | PRN
  Administered 2018-08-13 – 2018-08-18 (×4): 650 mg via ORAL
  Filled 2018-08-12 (×4): qty 2

## 2018-08-12 MED ORDER — HEPARIN SODIUM (PORCINE) 5000 UNIT/ML IJ SOLN
INTRAMUSCULAR | Status: AC
  Administered 2018-08-12: 5000 [IU] via SUBCUTANEOUS
  Filled 2018-08-12: qty 1

## 2018-08-12 MED ORDER — RIVASTIGMINE TARTRATE 3 MG PO CAPS
4.5000 mg | ORAL_CAPSULE | Freq: Two times a day (BID) | ORAL | Status: DC
  Administered 2018-08-12 – 2018-08-21 (×18): 4.5 mg via ORAL
  Filled 2018-08-12 (×24): qty 1

## 2018-08-12 MED ORDER — ACETAMINOPHEN 650 MG RE SUPP
650.0000 mg | Freq: Once | RECTAL | Status: AC
  Administered 2018-08-12: 650 mg via RECTAL
  Filled 2018-08-12: qty 1

## 2018-08-12 MED ORDER — HYDRALAZINE HCL 20 MG/ML IJ SOLN: 10.0000 mg | Freq: Three times a day (TID) | INTRAMUSCULAR | Status: DC | PRN

## 2018-08-12 NOTE — ED Notes (Signed)
Called [facility] to verify patient's meds.

## 2018-08-12 NOTE — ED Provider Notes (Signed)
[facility] EMERGENCY DEPARTMENT Provider Note   CSN: [id number] Arrival date & time: 08/12/18  4098     History   Chief Complaint Chief Complaint  Patient presents with  . Weakness    HPI Jon [name] is a 73 y.o. male.  The history is provided by a relative, a caregiver and the patient. The history is limited by the condition of the patient (Hx dementia).  Weakness  Pt was seen at 0900. Per pt and his family: Pt with gradual onset and worsening of persistent generalized weakness for the past 2 weeks. Pt was walking with is walker, now can barely stand to get into wheelchair. Pt slid out of bed this morning due to generalized weakness. Has been associated with multiple intermittent episodes of N/V and poor PO intake. Pt has been evaluated at OSH x2 for this complaint, rx zofran, then phenergan, without improvement of his symptoms. Pt has significant hx of dementia. No reported fevers, no diarrhea, no black or blood in stools or emesis, no CP/SOB, no cough.    Past Medical History:  Diagnosis Date  . AAA (abdominal aortic aneurysm) (HCC)   . Alzheimer disease (HCC)   . Anemia   . Ataxia   . Diabetes mellitus without complication (HCC)   . History of gallstones   . Hyperlipidemia   . Hypertension   . Vitamin D deficiency     Patient Active Problem List   Diagnosis Date Noted  . Lewy body dementia with behavioral disturbance (HCC) 03/12/2018  . Muscular incoordination 03/26/2017  . Abnormal chest CT 01/25/2016  . Lumbar compression fracture (HCC) 01/25/2016  . Bullous emphysema (HCC) 09/28/2015  . Microalbuminuria due to type 2 diabetes mellitus (HCC) 03/12/2015  . Mild cognitive disorder 05/15/2013  . Vitamin D deficiency   . Hyperlipidemia 03/19/2013  . Uncontrolled type 2 diabetes mellitus with complication, with long-term current use of insulin (HCC) 02/06/2013  . HTN (hypertension) 02/06/2013  . AAA (abdominal aortic aneurysm) without rupture (HCC) 02/06/2013    . Clubbing of fingers 02/06/2013  . Ex-smoker 02/06/2013  . History of unsteady gait 02/06/2013    Past Surgical History:  Procedure Laterality Date  . ERCP    . INGUINAL HERNIA REPAIR    . TONSILLECTOMY          Home Medications    Prior to Admission medications   Medication Sig Start Date End Date Taking? Authorizing Provider  Acetaminophen (TYLENOL ARTHRITIS EXT RELIEF PO) Take by mouth.    [provider]  aspirin 81 MG tablet Take 81 mg by mouth daily.    [provider]  citalopram (CELEXA) 10 MG tablet Take 10 mg by mouth daily.    [provider]  glucose blood test strip Check fasting and 2 hours after biggest meal 09/07/17   Mechele Claude, MD  Insulin Glargine (LANTUS SOLOSTAR) 100 UNIT/ML Solostar Pen INJECT 45 UNITS DAILY 04/25/18   Mechele Claude, MD  lisinopril (PRINIVIL,ZESTRIL) 5 MG tablet Take 1 tablet (5 mg total) by mouth daily. 11/08/17   Mechele Claude, MD  metFORMIN (GLUCOPHAGE) 1000 MG tablet Take 1 tablet (1,000 mg total) by mouth 2 (two) times daily with a meal. 12/24/17   Mechele Claude, MD  neomycin-polymyxin-hydrocortisone (CORTISPORIN) 3.5-10000-1 OTIC suspension Place 3 drops into the left ear 4 (four) times daily. 02/03/17   Johna Sheriff, MD  omeprazole (PRILOSEC) 20 MG capsule TAKE 1 CAPSULE DAILY 06/12/18   Mechele Claude, MD  ondansetron (ZOFRAN-ODT) 8 MG disintegrating  tablet Take 1 tablet (8 mg total) by mouth every 6 (six) hours as needed for nausea or vomiting. 08/02/18   Mechele ClaudeStacks, Warren, MD  rivastigmine (EXELON) 4.5 MG capsule Take 1 capsule (4.5 mg total) by mouth 2 (two) times daily. 09/07/17   Mechele ClaudeStacks, Warren, MD  SURE COMFORT PEN NEEDLES 32G X 6 MM MISC USE AS DIRECTED 03/11/18   Mechele ClaudeStacks, Warren, MD  TRULICITY 1.5 MG/0.5ML SOPN INJECT 1.5 MG INTO THE SKIN ONCE A WEEK 07/22/18   Mechele ClaudeStacks, Warren, MD    Family History Family History  Problem Relation Age of Onset  . Diabetes Mother   . Hip fracture Mother   . Pulmonary  embolism Mother        after hip fracture  . Lupus Sister   . Scleroderma Sister   . Diabetes Brother   . Hyperlipidemia Brother   . Hypertension Brother     Social History Social History   Tobacco Use  . Smoking status: Former Smoker    Packs/day: 1.00    Years: 40.00    Pack years: 40.00    Types: Cigarettes    Start date: 07/03/1966    Last attempt to quit: 02/06/2009    Years since quitting: 9.5  . Smokeless tobacco: Never Used  Substance Use Topics  . Alcohol use: No    Alcohol/week: 0.0 standard drinks  . Drug use: No     Allergies   Donepezil and Namenda [memantine hcl]   Review of Systems Review of Systems  Unable to perform ROS: Dementia  Neurological: Positive for weakness.     Physical Exam Updated Vital Signs BP 138/77 (BP Location: Left Arm)   Pulse 72   Temp 98.2 F (36.8 C) (Oral)   Resp 14   Ht 5\' 10"  (1.778 m)   Wt 81 kg   SpO2 90%   BMI 25.62 kg/m     Patient Vitals for the past 24 hrs:  BP Temp Temp src Pulse Resp SpO2 Height Weight  08/12/18 1223 (!) 129/91 (!) 102.2 F (39 C) Rectal 82 18 93 % - -  08/12/18 1100 130/77 - - 85 (!) 21 (!) 88 % - -  08/12/18 0901 138/77 98.2 F (36.8 C) Oral 72 14 90 % - -  08/12/18 0900 138/77 - - 73 20 90 % - -  08/12/18 0853 - - - - - - 5\' 10"  (1.778 m) 81 kg     09:17:42 Orthostatic Vital Signs AC  Orthostatic Lying   BP- Lying: 146/78  Pulse- Lying: 72      Orthostatic Sitting  BP- Sitting: 143/82  Pulse- Sitting: 82   Pt was unable to stand for orthostatics.  When pt sat up on side of bed, was unable to hold himself upright.  After pt put back into bed, began vomiting.  Medicated as ordered.     Physical Exam 0905: Physical examination:  Nursing notes reviewed; Vital signs and O2 SAT reviewed;  Constitutional: Well developed, Well nourished, In no acute distress; Head:  Normocephalic, atraumatic; Eyes: EOMI, PERRL, No scleral icterus; ENMT: Mouth and pharynx normal, Mucous membranes  dry;; Neck: Supple, Full range of motion, No lymphadenopathy; Cardiovascular: Regular rate and rhythm, No gallop; Respiratory: Breath sounds clear & equal bilaterally, No wheezes.  Speaking full sentences with ease, Normal respiratory effort/excursion; Chest: Nontender, Movement normal; Abdomen: Soft, +diffuse tenderness to palp. Nondistended, Normal bowel sounds; Genitourinary: No CVA tenderness; Extremities: Peripheral pulses normal, No tenderness, No edema, No calf edema or  asymmetry.; Neuro: Awake, alert, confused per hx dementia. No facial droop. Speech clear. Pt moves all extremities on stretcher spontaneously and to command without apparent gross focal motor deficits..; Skin: Color normal, Warm, Dry.   ED Treatments / Results  Labs (all labs ordered are listed, but only abnormal results are displayed)   EKG EKG Interpretation  Date/Time:  Monday August 12 2018 09:01:17 EST Ventricular Rate:  72 PR Interval:    QRS Duration: 102 QT Interval:  405 QTC Calculation: 444 R Axis:   -51 Text Interpretation:  Sinus rhythm Incomplete RBBB and LAFB Left axis deviation No old tracing to compare Confirmed by Samuel Jester [phone]) on 08/12/2018 9:43:04 AM   Radiology   Procedures Procedures (including critical care time)  Medications Ordered in ED Medications  ondansetron (ZOFRAN) injection 4 mg (4 mg Intravenous Given 08/12/18 0917)  famotidine (PEPCID) IVPB 20 mg premix (20 mg Intravenous New Bag/Given 08/12/18 1044)     Initial Impression / Assessment and Plan / ED Course  I have reviewed the triage vital signs and the nursing notes.  Pertinent labs & imaging results that were available during my care of the patient were reviewed by me and considered in my medical decision making (see chart for details).  MDM Reviewed: previous chart, nursing note and vitals Reviewed previous: labs and ECG Interpretation: labs, ECG, x-ray and CT scan    Results for orders placed or  performed during the hospital encounter of 08/12/18  Group A Strep by PCR  Result Value Ref Range   Group A Strep by PCR NOT DETECTED NOT DETECTED  CBC with Differential  Result Value Ref Range   WBC 14.2 (H) 4.0 - 10.5 K/uL   RBC 5.57 4.22 - 5.81 MIL/uL   Hemoglobin 17.1 (H) 13.0 - 17.0 g/dL   HCT 34.1 96.2 - 22.9 %   MCV 91.2 80.0 - 100.0 fL   MCH 30.7 26.0 - 34.0 pg   MCHC 33.7 30.0 - 36.0 g/dL   RDW 79.8 92.1 - 19.4 %   Platelets 168 150 - 400 K/uL   nRBC 0.0 0.0 - 0.2 %   Neutrophils Relative % 79 %   Neutro Abs 11.3 (H) 1.7 - 7.7 K/uL   Lymphocytes Relative 10 %   Lymphs Abs 1.4 0.7 - 4.0 K/uL   Monocytes Relative 10 %   Monocytes Absolute 1.4 (H) 0.1 - 1.0 K/uL   Eosinophils Relative 0 %   Eosinophils Absolute 0.0 0.0 - 0.5 K/uL   Basophils Relative 0 %   Basophils Absolute 0.0 0.0 - 0.1 K/uL   Immature Granulocytes 1 %   Abs Immature Granulocytes 0.08 (H) 0.00 - 0.07 K/uL  Comprehensive metabolic panel  Result Value Ref Range   Sodium 133 (L) 135 - 145 mmol/L   Potassium 4.0 3.5 - 5.1 mmol/L   Chloride 97 (L) 98 - 111 mmol/L   CO2 24 22 - 32 mmol/L   Glucose, Bld 178 (H) 70 - 99 mg/dL   BUN 14 8 - 23 mg/dL   Creatinine, Ser 1.74 0.61 - 1.24 mg/dL   Calcium 9.1 8.9 - 08.1 mg/dL   Total Protein 8.1 6.5 - 8.1 g/dL   Albumin 3.9 3.5 - 5.0 g/dL   AST 55 (H) 15 - 41 U/L   ALT 92 (H) 0 - 44 U/L   Alkaline Phosphatase 207 (H) 38 - 126 U/L   Total Bilirubin 1.5 (H) 0.3 - 1.2 mg/dL   GFR calc non Af Amer >  60 >60 mL/min   GFR calc Af Amer >60 >60 mL/min   Anion gap 12 5 - 15  Urinalysis, Routine w reflex microscopic  Result Value Ref Range   Color, Urine YELLOW YELLOW   APPearance CLEAR CLEAR   Specific Gravity, Urine 1.020 1.005 - 1.030   pH 6.5 5.0 - 8.0   Glucose, UA 250 (A) NEGATIVE mg/dL   Hgb urine dipstick NEGATIVE NEGATIVE   Bilirubin Urine NEGATIVE NEGATIVE   Ketones, ur 15 (A) NEGATIVE mg/dL   Protein, ur 30 (A) NEGATIVE mg/dL   Nitrite NEGATIVE  NEGATIVE   Leukocytes, UA NEGATIVE NEGATIVE  Troponin I - Once  Result Value Ref Range   Troponin I <0.03 <0.03 ng/mL  Lipase, blood  Result Value Ref Range   Lipase 30 11 - 51 U/L  Urinalysis, Microscopic (reflex)  Result Value Ref Range   RBC / HPF 0-5 0 - 5 RBC/hpf   WBC, UA NONE SEEN 0 - 5 WBC/hpf   Bacteria, UA NONE SEEN NONE SEEN   Squamous Epithelial / LPF NONE SEEN 0 - 5   Ct Abdomen Pelvis Wo Contrast Result Date: 08/12/2018 CLINICAL DATA:  Generalize weakness. Vomiting and nausea over the last 2 weeks. Dementia. EXAM: CT ABDOMEN AND PELVIS WITHOUT CONTRAST TECHNIQUE: Multidetector CT imaging of the abdomen and pelvis was performed following the standard protocol without IV contrast. COMPARISON:  Ultrasound 08/07/2018 FINDINGS: Lower chest: Emphysema and pulmonary scarring. No sign of active infiltrate, mass, effusion or collapse in the region studied. Hepatobiliary: 1.6 cm gallstone dependent in the gallbladder. No focal liver parenchymal finding. Pancreas: Normal Spleen: Normal Adrenals/Urinary Tract: Adrenal glands are normal. Kidneys show a few low-density areas presumed to represent cysts. No evidence of stone, solid mass or obstruction. Bladder is moderately distended but otherwise unremarkable. Stomach/Bowel: No abnormal bowel finding. No evidence of obstruction. No inflammatory change. No evidence of bowel mass. Vascular/Lymphatic: Aortic atherosclerosis with infrarenal abdominal aortic aneurysm maximal transverse diameter 3.3 cm. IVC is normal. No retroperitoneal adenopathy. Reproductive: Normal Other: No free fluid or air. Musculoskeletal: Old healed compression deformities at L1 and L4. Ordinary lower lumbar facet degeneration and disc degeneration. IMPRESSION: No acute finding to explain the clinical presentation. 1.6 cm calcified gallstone.  No CT evidence of cholecystitis. Moderate distended bladder. Aortic atherosclerosis with infrarenal abdominal aortic aneurysm maximal  transverse diameter 3.3 cm. Recommend followup by ultrasound in 3 years. This recommendation follows ACR consensus guidelines: White Paper of the ACR Incidental Findings Committee II on Vascular Findings. J Am Coll Radiol 2013; 10:789-794. Aortic aneurysm NOS (ICD10-I71.9) Electronically Signed   By: Paulina Fusi M.D.   On: 08/12/2018 10:07   Dg Chest 2 View Result Date: 08/12/2018 CLINICAL DATA:  Abdominal pain.  Generalized weakness. EXAM: CHEST - 2 VIEW COMPARISON:  07/31/2016 insert CT 03/28/2016 FINDINGS: Two views of the chest demonstrate chronic lung changes compatible with emphysema and large blebs or bulla in the anterior chest. Patchy interstitial densities in both lungs related to chronic changes and probably associated with low lung volumes. Evidence for scarring in the right lower chest. Heart and mediastinum are within normal limits. Atherosclerotic calcifications at the aortic arch. Negative for a pneumothorax. No acute bone abnormality. IMPRESSION: Chronic lung changes compatible with emphysema. Prominent interstitial lung densities in both lungs particularly at the right lung base. Findings could be related to low lung volumes but difficult to exclude subtle infectious process. Electronically Signed   By: Richarda Overlie M.D.   On: 08/12/2018 10:10  Ct Head Wo Contrast Result Date: 08/12/2018 CLINICAL DATA:  Generalize weakness. Nausea and vomiting over the last 2 weeks. Dementia. EXAM: CT HEAD WITHOUT CONTRAST TECHNIQUE: Contiguous axial images were obtained from the base of the skull through the vertex without intravenous contrast. COMPARISON:  06/25/2017 FINDINGS: Brain: Generalized atrophy. Chronic small-vessel ischemic changes throughout the cerebral hemispheric white matter. No sign of acute infarction, mass lesion, hemorrhage or extra-axial collection. Ventricular prominence is felt consistent with central atrophy. Normal pressure hydrocephalus not excluded but not favored. Vascular: There  is atherosclerotic calcification of the major vessels at the base of the brain. Skull: Negative Sinuses/Orbits: Mild chronic inflammatory changes of the right maxillary sinus and left sphenoid sinus. Orbits negative. Other: None IMPRESSION: Chronic brain atrophy. Advanced chronic small-vessel ischemic changes of the cerebral hemispheric white matter. Chronic ventriculomegaly presumed secondary to central atrophy. Normal pressure hydrocephalus not excluded but not favored on the basis of the imaging. Electronically Signed   By: Paulina FusiMark  Shogry M.D.   On: 08/12/2018 10:10   [place] Abdomen Limited Ruq Result Date: 08/12/2018 CLINICAL DATA:  Elevated LFTs with nausea and vomiting 2 weeks. EXAM: ULTRASOUND ABDOMEN LIMITED RIGHT UPPER QUADRANT COMPARISON:  CT today. FINDINGS: Gallbladder: Moderate gallbladder sludge with single shadowing mobile stone measuring 1.5 cm. No significant gallbladder wall thickening. Negative sonographic Murphy sign. No adjacent fluid. Common bile duct: Diameter: 3.0 mm. Liver: Heterogeneous parenchymal echotexture without focal mass. Portal vein is patent on color Doppler imaging with normal direction of blood flow towards the liver. Incidentally noted is a 2.5 cm right renal cyst. IMPRESSION: Moderate gallbladder sludge with mobile 1.5 cm gallstone. No additional sonographic evidence to suggest acute cholecystitis. Electronically Signed   By: Elberta Fortisaniel  Boyle M.D.   On: 08/12/2018 12:09     Results for Annabell HowellsDUNLAP, Oluwafemi L (MRN [id number]) as of 08/12/2018 11:02  Ref. Range 08/11/2016 08:00 06/08/2017 17:19 12/24/2017 14:43 05/03/2018 12:43 08/12/2018 09:28  AST Latest Ref Range: 15 - 41 U/L 33 36 51 (H) 41 (H) 55 (H)  ALT Latest Ref Range: 0 - 44 U/L 39 48 (H) 57 (H) 53 (H) 92 (H)  Total Bilirubin Latest Ref Range: 0.3 - 1.2 mg/dL 0.6 0.3 0.4 0.4 1.5 (H)    1225:  LFT's mildly elevated from previous last fall. Pt with N/V while in the ED, unable to stand due to generalized weakness. US RUQ without  acute cholecystitis at this time. T/C returned from [facility] Dr. Henreitta LeberBridges, case discussed, including:  HPI, pertinent PM/SHx, VS/PE, dx testing, ED course and treatment:  No acute surgical issue at this time, agreeable to consult tomorrow.   1250:  Pt now febrile. UC, BC ordered, as well as flu test. Rectal APAP for fever, given pt is unable to tolerate PO (ie: motrin).  T/C returned from [facility] Dr. Gwenlyn PerkingMadera, case discussed, including:  HPI, pertinent PM/SHx, VS/PE, dx testing, ED course and treatment:  Agreeable to admit.    Final Clinical Impressions(s) / ED Diagnoses   Final diagnoses:  None    ED Discharge Orders    None       Samuel JesterMcManus, Jacyln Carmer, DO 08/14/18 1903

## 2018-08-12 NOTE — ED Notes (Signed)
Pt given water per RN approval. 

## 2018-08-12 NOTE — ED Notes (Signed)
Unknown yellow liquid in pt floor. Called EVS to mop floor.

## 2018-08-12 NOTE — Progress Notes (Signed)
Pharmacy Antibiotic Note  Jon [name] is a 73 y.o. male admitted on 08/12/2018 with aspiration pneumonia.  Pharmacy has been consulted for Unasyn dosing.  Plan: Unasyn 3gm IV q8h F/U cxs and clinical progress Monitor V/S, lab  Height: 5\' 10"  (177.8 cm) Weight: 178 lb 9.2 oz (81 kg) IBW/kg (Calculated) : 73  Temp (24hrs), Avg:100.2 F (37.9 C), Min:98.2 F (36.8 C), Max:102.2 F (39 C)  Recent Labs  Lab 08/12/18 0928  WBC 14.2*  CREATININE 0.97    Estimated Creatinine Clearance: 73.2 mL/min (by C-G formula based on SCr of 0.97 mg/dL).    Allergies  Allergen Reactions  . Donepezil Nausea And Vomiting  . Namenda [Memantine Hcl] Other (See Comments)    Sleeps all the time    Antimicrobials this admission: Unasyn 2/10 >>   Dose adjustments this admission: n/a  Microbiology results: 2/10 BCx: pending 2/10 UCx: pending 2/10 Influenza: pending   Thank you for allowing pharmacy to be a part of this patient's care.  Elder Cyphers, BS Pharm D, BCPS Clinical Pharmacist Pager [phone] 08/12/2018 1:13 PM

## 2018-08-12 NOTE — ED Notes (Signed)
Tiffany AC notified to bring Celexa

## 2018-08-12 NOTE — ED Notes (Signed)
Drema BalzarineBritney Smith, stepdaughter, wanting to be informed on pt but is not listed under emergency contacts.

## 2018-08-12 NOTE — H&P (Addendum)
History and Physical    Jon [name] ZOX:[id number] DOB: 14-Jun-1946 DOA: 08/12/2018  Referring MD/NP/PA: Dr. Clarene Duke PCP: Mechele Claude, MD  Patient coming from: Home  Chief Complaint: Intractable nausea, vomiting, fever, inability to keep anything down and complaining of abdominal pain.  HPI: Jon [name] is a 73 y.o. male with a past medical history significant for abdominal aortic aneurysm, dementia with behavioral disturbances, type 2 diabetes mellitus with hyperglycemia, hypertension and hyperlipidemia; who presented to the hospital secondary to intractable nausea/vomiting, fever, inability to keep anything down and complaining of abdominal pain.  Apparently symptoms has been present for the last week and a half and worsening.  Food makes his symptoms worse and at this moment he is having trouble even keeping his medications down.  Patient ended experiencing a fall at home prior to coming to the hospital due to generalized weakness associated with ongoing symptoms and dehydration. Per reports by family at bedside they have visited a different emergency department where work-up demonstrated concern for biliary abnormalities as probably 1 of the main reasons for his symptoms.  At that time patient declines any further work-up and ended going home.  Symptom has continue progressing and given inability to keep anything down they came once again to the hospital for further evaluation and management.  In ED patient was found to have a fever of 102, elevated WBCs, signs of dehydration with hemoconcentrated samples and elevated hemoglobin level.  Chest x-ray demonstrated bibasilar infiltrates and bronchitic changes.  Patient actively throwing up in the ED.  He had elevated LFTs and a bilirubin of 1.5.  CT abdomen demonstrated cholelithiasis right upper quadrant ultrasound with cholelithiasis and gallbladder sludge.  Patient was also found to be positive for influenza A.  Antiemetics and fluid  resuscitation given in the ED.  General surgery was consulted and TRH was called to place patient in observation for further evaluation and management.  Past Medical/Surgical History: Past Medical History:  Diagnosis Date  . AAA (abdominal aortic aneurysm) (HCC)   . Alzheimer disease (HCC)   . Anemia   . Ataxia   . Diabetes mellitus without complication (HCC)   . History of gallstones   . Hyperlipidemia   . Hypertension   . Vitamin D deficiency     Past Surgical History:  Procedure Laterality Date  . ERCP    . INGUINAL HERNIA REPAIR    . TONSILLECTOMY     Social History:  reports that he quit smoking about 9 years ago. His smoking use included cigarettes. He started smoking about 52 years ago. He has a 40.00 pack-year smoking history. He has never used smokeless tobacco. He reports that he does not drink alcohol or use drugs.  Allergies: Allergies  Allergen Reactions  . Donepezil Nausea And Vomiting  . Namenda [Memantine Hcl] Other (See Comments)    Sleeps all the time    Family History:  Family History  Problem Relation Age of Onset  . Diabetes Mother   . Hip fracture Mother   . Pulmonary embolism Mother        after hip fracture  . Lupus Sister   . Scleroderma Sister   . Diabetes Brother   . Hyperlipidemia Brother   . Hypertension Brother     Prior to Admission medications   Medication Sig Start Date End Date Taking? Authorizing Provider  Acetaminophen (TYLENOL ARTHRITIS EXT RELIEF PO) Take by mouth.   Yes [provider]  aspirin 81 MG tablet Take 81 mg by  mouth daily.   Yes [provider]  Cholecalciferol (VITAMIN D) 50 MCG (2000 UT) CAPS Take 1 capsule by mouth daily.   Yes [provider]  citalopram (CELEXA) 10 MG tablet Take 10 mg by mouth daily.   Yes [provider]  glucose blood test strip Check fasting and 2 hours after biggest meal 09/07/17  Yes Stacks, Broadus JohnWarren, MD  Insulin Glargine (LANTUS SOLOSTAR) 100 UNIT/ML  Solostar Pen INJECT 45 UNITS DAILY 04/25/18  Yes Mechele ClaudeStacks, Warren, MD  lisinopril (PRINIVIL,ZESTRIL) 5 MG tablet Take 1 tablet (5 mg total) by mouth daily. 11/08/17  Yes Mechele ClaudeStacks, Warren, MD  metFORMIN (GLUCOPHAGE) 1000 MG tablet Take 1 tablet (1,000 mg total) by mouth 2 (two) times daily with a meal. 12/24/17  Yes Stacks, Broadus JohnWarren, MD  omeprazole (PRILOSEC) 20 MG capsule TAKE 1 CAPSULE DAILY 06/12/18  Yes Mechele ClaudeStacks, Warren, MD  promethazine (PHENERGAN) 25 MG tablet  08/08/18  Yes [provider]  rivastigmine (EXELON) 4.5 MG capsule Take 1 capsule (4.5 mg total) by mouth 2 (two) times daily. 09/07/17  Yes Stacks, Broadus JohnWarren, MD  SURE COMFORT PEN NEEDLES 32G X 6 MM MISC USE AS DIRECTED 03/11/18  Yes Stacks, Broadus JohnWarren, MD  ondansetron (ZOFRAN-ODT) 8 MG disintegrating tablet Take 1 tablet (8 mg total) by mouth every 6 (six) hours as needed for nausea or vomiting. Patient not taking: Reported on 08/12/2018 08/02/18   Mechele ClaudeStacks, Warren, MD  TRULICITY 1.5 MG/0.5ML SOPN INJECT 1.5 MG INTO THE SKIN ONCE A WEEK Patient not taking: INJECT 1.5 MG INTO THE SKIN ONCE A WEEK 07/22/18   Mechele ClaudeStacks, Warren, MD    Review of Systems:  Positive for chills, nausea, vomiting, abdominal pain.  Otherwise review of systems negative.  Information assisted by daughter at bedside, patient with underlying cognitive impairment making difficult for cooperation.   Physical Exam: Vitals:   08/12/18 1230 08/12/18 1400 08/12/18 1630 08/12/18 1730  BP: 133/86 122/72 104/65 110/61  Pulse: 80 85 66 60  Resp: (!) 23 18 20 17   Temp:      TempSrc:      SpO2: 92% 90% 91% 90%  Weight:      Height:       Constitutional: Warm to touch, denying chest pain, with positive intermittent episode of wet cough.  Patient reports feeling slightly nauseous.  Eyes: PERRL, lids and conjunctivae normal; no icterus. ENMT: Mucous membranes are dry on examination.  Posterior pharynx clear of any exudate or lesions. Neck: normal, supple, no masses, no thyromegaly, no  JVD. Respiratory: Diffuse rhonchi, positive tachypnea, no using accessory muscles.  No wheezing. Cardiovascular: Regular rate and rhythm, positive systolic ejection murmur, no rubs, no gallops, no lower extremity edema. Abdomen: Diffuse and vague tenderness on palpation, mainly right upper quadrant area and mid epigastric abdomen.  Positive bowel sounds, no guarding, soft, no distention.   Musculoskeletal: no clubbing / cyanosis. No joint deformity upper and lower extremities. Good ROM, no contractures. Normal muscle tone.  Skin: no rashes, lesions, ulcers. No induration Neurologic: CN 2-12 grossly intact. Sensation intact, moving 4 limbs, muscle strength 4 out of 5 bilaterally symmetrically in the setting of poor effort. Psychiatric: Judgment and insight appears to be impair due to underlying cognitive disorder.   Labs on Admission: I have personally reviewed the following labs and imaging studies  CBC: Recent Labs  Lab 08/12/18 0928  WBC 14.2*  NEUTROABS 11.3*  HGB 17.1*  HCT 50.8  MCV 91.2  PLT 168   Basic Metabolic Panel: Recent Labs  Lab 08/12/18 0928  NA 133*  K 4.0  CL 97*  CO2 24  GLUCOSE 178*  BUN 14  CREATININE 0.97  CALCIUM 9.1   GFR: Estimated Creatinine Clearance: 73.2 mL/min (by C-G formula based on SCr of 0.97 mg/dL).   Liver Function Tests: Recent Labs  Lab 08/12/18 0928  AST 55*  ALT 92*  ALKPHOS 207*  BILITOT 1.5*  PROT 8.1  ALBUMIN 3.9   Recent Labs  Lab 08/12/18 0928  LIPASE 30   Cardiac Enzymes: Recent Labs  Lab 08/12/18 0928  TROPONINI <0.03   Urine analysis:    Component Value Date/Time   COLORURINE YELLOW 08/12/2018 0857   APPEARANCEUR CLEAR 08/12/2018 0857   APPEARANCEUR Clear 05/03/2018 1147   LABSPEC 1.020 08/12/2018 0857   PHURINE 6.5 08/12/2018 0857   GLUCOSEU 250 (A) 08/12/2018 0857   HGBUR NEGATIVE 08/12/2018 0857   BILIRUBINUR NEGATIVE 08/12/2018 0857   BILIRUBINUR Negative 05/03/2018 1147   KETONESUR 15 (A)  08/12/2018 0857   PROTEINUR 30 (A) 08/12/2018 0857   NITRITE NEGATIVE 08/12/2018 0857   LEUKOCYTESUR NEGATIVE 08/12/2018 0857   LEUKOCYTESUR Negative 05/03/2018 1147    Recent Results (from the past 240 hour(s))  Group A Strep by PCR     Status: None   Collection Time: 08/12/18 11:41 AM  Result Value Ref Range Status   Group A Strep by PCR NOT DETECTED NOT DETECTED Final    Comment: Performed at [facility], [address] [place], [place] [address]     Radiological Exams on Admission: Ct Abdomen Pelvis Wo Contrast  Result Date: 08/12/2018 CLINICAL DATA:  Generalize weakness. Vomiting and nausea over the last 2 weeks. Dementia. EXAM: CT ABDOMEN AND PELVIS WITHOUT CONTRAST TECHNIQUE: Multidetector CT imaging of the abdomen and pelvis was performed following the standard protocol without IV contrast. COMPARISON:  Ultrasound 08/07/2018 FINDINGS: Lower chest: Emphysema and pulmonary scarring. No sign of active infiltrate, mass, effusion or collapse in the region studied. Hepatobiliary: 1.6 cm gallstone dependent in the gallbladder. No focal liver parenchymal finding. Pancreas: Normal Spleen: Normal Adrenals/Urinary Tract: Adrenal glands are normal. Kidneys show a few low-density areas presumed to represent cysts. No evidence of stone, solid mass or obstruction. Bladder is moderately distended but otherwise unremarkable. Stomach/Bowel: No abnormal bowel finding. No evidence of obstruction. No inflammatory change. No evidence of bowel mass. Vascular/Lymphatic: Aortic atherosclerosis with infrarenal abdominal aortic aneurysm maximal transverse diameter 3.3 cm. IVC is normal. No retroperitoneal adenopathy. Reproductive: Normal Other: No free fluid or air. Musculoskeletal: Old healed compression deformities at L1 and L4. Ordinary lower lumbar facet degeneration and disc degeneration. IMPRESSION: No acute finding to explain the clinical presentation. 1.6 cm calcified gallstone.  No CT evidence of  cholecystitis. Moderate distended bladder. Aortic atherosclerosis with infrarenal abdominal aortic aneurysm maximal transverse diameter 3.3 cm. Recommend followup by ultrasound in 3 years. This recommendation follows ACR consensus guidelines: White Paper of the ACR Incidental Findings Committee II on Vascular Findings. J Am Coll Radiol 2013; 10:789-794. Aortic aneurysm NOS (ICD10-I71.9) Electronically Signed   By: Paulina FusiMark  Shogry M.D.   On: 08/12/2018 10:07   Dg Chest 2 View  Result Date: 08/12/2018 CLINICAL DATA:  Abdominal pain.  Generalized weakness. EXAM: CHEST - 2 VIEW COMPARISON:  07/31/2016 insert CT 03/28/2016 FINDINGS: Two views of the chest demonstrate chronic lung changes compatible with emphysema and large blebs or bulla in the anterior chest. Patchy interstitial densities in both lungs related to chronic changes and probably associated with low lung volumes. Evidence for  scarring in the right lower chest. Heart and mediastinum are within normal limits. Atherosclerotic calcifications at the aortic arch. Negative for a pneumothorax. No acute bone abnormality. IMPRESSION: Chronic lung changes compatible with emphysema. Prominent interstitial lung densities in both lungs particularly at the right lung base. Findings could be related to low lung volumes but difficult to exclude subtle infectious process. Electronically Signed   By: Richarda Overlie M.D.   On: 08/12/2018 10:10   Ct Head Wo Contrast  Result Date: 08/12/2018 CLINICAL DATA:  Generalize weakness. Nausea and vomiting over the last 2 weeks. Dementia. EXAM: CT HEAD WITHOUT CONTRAST TECHNIQUE: Contiguous axial images were obtained from the base of the skull through the vertex without intravenous contrast. COMPARISON:  06/25/2017 FINDINGS: Brain: Generalized atrophy. Chronic small-vessel ischemic changes throughout the cerebral hemispheric white matter. No sign of acute infarction, mass lesion, hemorrhage or extra-axial collection. Ventricular  prominence is felt consistent with central atrophy. Normal pressure hydrocephalus not excluded but not favored. Vascular: There is atherosclerotic calcification of the major vessels at the base of the brain. Skull: Negative Sinuses/Orbits: Mild chronic inflammatory changes of the right maxillary sinus and left sphenoid sinus. Orbits negative. Other: None IMPRESSION: Chronic brain atrophy. Advanced chronic small-vessel ischemic changes of the cerebral hemispheric white matter. Chronic ventriculomegaly presumed secondary to central atrophy. Normal pressure hydrocephalus not excluded but not favored on the basis of the imaging. Electronically Signed   By: Paulina Fusi M.D.   On: 08/12/2018 10:10   US Abdomen Limited Ruq  Result Date: 08/12/2018 CLINICAL DATA:  Elevated LFTs with nausea and vomiting 2 weeks. EXAM: ULTRASOUND ABDOMEN LIMITED RIGHT UPPER QUADRANT COMPARISON:  CT today. FINDINGS: Gallbladder: Moderate gallbladder sludge with single shadowing mobile stone measuring 1.5 cm. No significant gallbladder wall thickening. Negative sonographic Murphy sign. No adjacent fluid. Common bile duct: Diameter: 3.0 mm. Liver: Heterogeneous parenchymal echotexture without focal mass. Portal vein is patent on color Doppler imaging with normal direction of blood flow towards the liver. Incidentally noted is a 2.5 cm right renal cyst. IMPRESSION: Moderate gallbladder sludge with mobile 1.5 cm gallstone. No additional sonographic evidence to suggest acute cholecystitis. Electronically Signed   By: Elberta Fortis M.D.   On: 08/12/2018 12:09    EKG: Independently reviewed.  No acute ischemic changes, sinus rhythm, left fascicular block appreciated.  Normal QT.  Assessment/Plan 1-Intractable nausea and vomiting -With concerns for biliary colic/cholelithiasis vs influenza/PNA. -Patient with elevated LFTs and elevated bilirubin -Ultrasound and CT abdomen demonstrated mobile gallstone 1.5 cm and also sludge in his  gallbladder. -According to family member symptoms worse with eating and drinking. -Has been unable to keep anything down. -Elevated WBCs, discomfort on palpation of his abdomen and febrile on physical exam -Patient started on Unasyn -Will provide fluid resuscitation -As needed antiemetics and antipyretics. -N.p.o. for bowel rest. -Lipase negative  2-influenza A -Start Tamiflu -Provide IV fluid resuscitation -Supportive care and as needed antiemetics.  3-PNA -patient with ongoing intractable nausea/vomiting and now febril and with productive cough -he was positive for influenza A; but CXR worrisome for bibasilar infiltrates. -will cover with unasyn -provide PRN duoneb -continue PRN oxygen supplementation. -As mentioned above patient started on Tamiflu.  4-Uncontrolled type 2 diabetes mellitus with complication, with long-term current use of insulin (HCC) -Will check A1c -Follow CBGs every 6 hours while n.p.o. -Will use a sliding scale insulin -Hold oral hypoglycemic agents.  5-HTN (hypertension) -Holding oral antihypertensive agents for now -Will use as needed hydralazine. -Follow vital signs -Continue IV fluids  6-AAA (abdominal aortic aneurysm) without rupture (HCC) -Transverse diameter 3.3 cm -Recommending follow-up by ultrasound in 3 years.  7-Hyperlipidemia/elevated LFTs -Holding statins -Patient with elevated LFTs -Provide fluid resuscitation -N.p.o. for now.  8-Dementia with behavioral disturbance (HCC) -Continue supportive care -Appears to have limited functionality at baseline -Continue the use of Celexa and Exelon    DVT prophylaxis: heparin   Code Status: Full Family Communication: daughter at bedside  Disposition Plan: to be determine base on clinical improvement.  Consults called: general surgery   Admission status: inpatient, med-surg, LOS > 2 midnights    Time Spent: 70 minutes  Vassie Loll MD [facility] Pager  [phone]  08/12/2018, 5:33 PM

## 2018-08-12 NOTE — ED Notes (Signed)
Rectal temp per family request.

## 2018-08-12 NOTE — ED Notes (Addendum)
Pt was unable to stand for orthostatics.  When pt sat up on side of bed, was unable to hold himself upright.  After pt put back into bed, began vomiting.  Medicated as ordered.

## 2018-08-12 NOTE — ED Triage Notes (Addendum)
Ems reports pt c/o generalized weakness, vomiting, and nausea x 2 weeks.  Has been to multiple hospitals recently for same.  cbg 234 with ems.    Pt has dementia and lives with family.  Pt slid out of bed this morning.  Denies any pain.

## 2018-08-13 ENCOUNTER — Encounter (HOSPITAL_COMMUNITY): Payer: Self-pay

## 2018-08-13 DIAGNOSIS — K8020 Calculus of gallbladder without cholecystitis without obstruction: Secondary | ICD-10-CM

## 2018-08-13 DIAGNOSIS — R748 Abnormal levels of other serum enzymes: Secondary | ICD-10-CM

## 2018-08-13 LAB — COMPREHENSIVE METABOLIC PANEL
ALT: 73 U/L — ABNORMAL HIGH (ref 0–44)
AST: 53 U/L — ABNORMAL HIGH (ref 15–41)
Albumin: 3 g/dL — ABNORMAL LOW (ref 3.5–5.0)
Alkaline Phosphatase: 161 U/L — ABNORMAL HIGH (ref 38–126)
Anion gap: 7 (ref 5–15)
BUN: 15 mg/dL (ref 8–23)
CO2: 24 mmol/L (ref 22–32)
Calcium: 8.4 mg/dL — ABNORMAL LOW (ref 8.9–10.3)
Chloride: 104 mmol/L (ref 98–111)
Creatinine, Ser: 0.93 mg/dL (ref 0.61–1.24)
GFR calc Af Amer: >60 mL/min (ref >60)
GFR calc non Af Amer: >60 mL/min (ref >60)
Glucose, Bld: 138 mg/dL — ABNORMAL HIGH (ref 70–99)
Potassium: 4.1 mmol/L (ref 3.5–5.1)
Sodium: 135 mmol/L (ref 135–145)
Total Bilirubin: 1.5 mg/dL — ABNORMAL HIGH (ref 0.3–1.2)
Total Protein: 6.4 g/dL — ABNORMAL LOW (ref 6.5–8.1)

## 2018-08-13 LAB — GLUCOSE, CAPILLARY
Glucose-Capillary: 141 mg/dL — ABNORMAL HIGH (ref 70–99)
Glucose-Capillary: 102 mg/dL — ABNORMAL HIGH (ref 70–99)
Glucose-Capillary: 194 mg/dL — ABNORMAL HIGH (ref 70–99)
Glucose-Capillary: 191 mg/dL — ABNORMAL HIGH (ref 70–99)

## 2018-08-13 LAB — CBC
HCT: 43.2 % (ref 39.0–52.0)
HEMOGLOBIN: 14.2 g/dL (ref 13.0–17.0)
MCH: 30.2 pg (ref 26.0–34.0)
MCHC: 32.9 g/dL (ref 30.0–36.0)
MCV: 91.9 fL (ref 80.0–100.0)
Platelets: 140 10*3/uL — ABNORMAL LOW (ref 150–400)
RBC: 4.7 MIL/uL (ref 4.22–5.81)
RDW: 13.2 % (ref 11.5–15.5)
WBC: 12 10*3/uL — ABNORMAL HIGH (ref 4.0–10.5)
nRBC: 0 % (ref 0.0–0.2)

## 2018-08-13 LAB — URINE CULTURE: Culture: NO GROWTH

## 2018-08-13 LAB — HIV ANTIBODY (ROUTINE TESTING W REFLEX): HIV Screen 4th Generation wRfx: NONREACTIVE

## 2018-08-13 LAB — CBG MONITORING, ED
Glucose-Capillary: 112 mg/dL — ABNORMAL HIGH (ref 70–99)
Glucose-Capillary: 130 mg/dL — ABNORMAL HIGH (ref 70–99)
Glucose-Capillary: 133 mg/dL — ABNORMAL HIGH (ref 70–99)

## 2018-08-13 LAB — PHOSPHORUS: Phosphorus: 2.9 mg/dL (ref 2.5–4.6)

## 2018-08-13 LAB — MAGNESIUM: Magnesium: 1.9 mg/dL (ref 1.7–2.4)

## 2018-08-13 NOTE — Progress Notes (Signed)
PROGRESS NOTE    NAASIR NEWCOMER  VEL:[id number] DOB: 1946-06-29 DOA: 08/12/2018 PCP: Mechele Claude, MD     Brief Narrative:  73 y.o. male with a past medical history significant for abdominal aortic aneurysm, dementia with behavioral disturbances, type 2 diabetes mellitus with hyperglycemia, hypertension and hyperlipidemia; who presented to the hospital secondary to intractable nausea/vomiting, fever, inability to keep anything down and complaining of abdominal pain.  Apparently symptoms has been present for the last week and a half and worsening.  Food makes his symptoms worse and at this moment he is having trouble even keeping his medications down.  Patient ended experiencing a fall at home prior to coming to the hospital due to generalized weakness associated with ongoing symptoms and dehydration. Per reports by family at bedside they have visited a different emergency department where work-up demonstrated concern for biliary abnormalities as probably 1 of the main reasons for his symptoms.  At that time patient declines any further work-up and ended going home.  Symptom has continue progressing and given inability to keep anything down they came once again to the hospital for further evaluation and management.  In ED patient was found to have a fever of 102, elevated WBCs, signs of dehydration with hemoconcentrated samples and elevated hemoglobin level.  Chest x-ray demonstrated bibasilar infiltrates and bronchitic changes.  Patient actively throwing up in the ED.  He had elevated LFTs and a bilirubin of 1.5.  CT abdomen demonstrated cholelithiasis right upper quadrant ultrasound with cholelithiasis and gallbladder sludge.  Patient was also found to be positive for influenza A.  Antiemetics and fluid resuscitation given in the ED.  Assessment & Plan: 1-intractable nausea and vomiting:  -Multifactorial: With concern for biliary colic/cholelithiasis versus influenza A infection and aspiration  PNA. -She with elevated LFTs and elevated bilirubin -CT abdomen demonstrated a mobile gallstone 1.5 cm and also a sludge in his gallbladder ultrasound. -General surgery consulted -Will allow full liquid diet, continue IV fluids, continue as needed antiemetics and analgesics. -Continue Tamiflu as part of the treatment for influenza and will continue the use of Unasyn to cover biliary infection along with aspiration pneumonia. -follow clinical response.  2-influenza A -Continue Tamiflu -Continue supportive care and fluid resuscitation.  3-pneumonia: Appreciated bilateral infiltrates on x-ray, elevated WBCs, fever and productive cough. -Given history of ongoing intractable vomiting high concern for aspiration -Continue oxygen supplementation -Continue as needed DuoNeb -Continue antibiotic treatment with Unasyn  4-uncontrolled type 2 diabetes mellitus -Continue holding hypoglycemic agents while inpatient -Continue sliding scale insulin. -Check CBGs QAC/nightly.  5-hypertension -Continue holding oral antihypertensive medication regimen-continue as needed hydralazine. -Blood pressure stable.  6-AAA without rupture -Transverse diameter 3.3 cm -Recommend follow-up by ultrasound in 3 years.  7-hyperlipidemia/elevated LFTs -Will continue holding statin for now -LFTs slightly trending down -Continue fluid resuscitation. -Follow recommendation by general surgery.  8-dementia with behavioral disturbances -Continue supportive care -Continue the use of Celexa and Exelon.  DVT prophylaxis: Heparin Code Status: Full code Family Communication: No family at bedside. Disposition Plan: Remains inpatient, continue IV fluids, IV antibiotics, Tamiflu for treatment of influenza and supportive care.  Will follow general surgery recommendations for further evaluation and management of biliary colic/cholelithiasis.  Consultants:   General surgery  Procedures:   See below for x-ray  reports.  Antimicrobials:  Anti-infectives (From admission, onward)   Start     Dose/Rate Route Frequency Ordered Stop   08/12/18 2200  oseltamivir (TAMIFLU) capsule 30 mg  Status:  Discontinued     30  mg Oral 2 times daily 08/12/18 1737 08/12/18 1805   08/12/18 2200  oseltamivir (TAMIFLU) capsule 75 mg     75 mg Oral 2 times daily 08/12/18 1805 08/17/18 2159   08/12/18 1330  Ampicillin-Sulbactam (UNASYN) 3 g in sodium chloride 0.9 % 100 mL IVPB     3 g 200 mL/hr over 30 Minutes Intravenous Every 8 hours 08/12/18 1313        Subjective: Reported having chills, intermittent nausea, no vomiting, no complaining of abdominal pain.  Denies shortness of breath.  2 L nasal, supplementation in place.  Objective: Vitals:   08/13/18 0745 08/13/18 0800 08/13/18 0830 08/13/18 0928  BP:  122/71 131/70 125/67  Pulse: (!) 57 (!) 58 60 64  Resp: 18 18 18 18   Temp:    99.3 F (37.4 C)  TempSrc:    Oral  SpO2: 96% 96% 100% 95%  Weight:      Height:        Intake/Output Summary (Last 24 hours) at 08/13/2018 1136 Last data filed at 08/13/2018 0748 Gross per 24 hour  Intake 805 ml  Output -  Net 805 ml   Filed Weights   08/12/18 0853  Weight: 81 kg    Examination: General exam: Alert, awake, oriented x 2; reporting intermittent nausea but no further vomiting.  Currently denies abdominal pain.  Patient expressed having chills and requested a warm blanket. Respiratory system: Positive rhonchi right, no wheezing, no crackles.  2 L nasal cannula supplementation in place. Cardiovascular system:RRR. No murmurs, rubs, gallops. Gastrointestinal system: Abdomen is nondistended, soft and nontender. No organomegaly or masses felt. Normal bowel sounds heard. Central nervous system: Alert and oriented x2. No focal neurological deficits.  But difficult to assess given underlying dementia and overall poor cooperation. Extremities: No C/C/E, +pedal pulses Skin: No rashes, lesions or ulcers Psychiatry:  Judgement and insight appear impaired secondary to dementia.    Data Reviewed: I have personally reviewed following labs and imaging studies  CBC: Recent Labs  Lab 08/12/18 0928 08/13/18 0546  WBC 14.2* 12.0*  NEUTROABS 11.3*  --   HGB 17.1* 14.2  HCT 50.8 43.2  MCV 91.2 91.9  PLT 168 140*   Basic Metabolic Panel: Recent Labs  Lab 08/12/18 0928 08/13/18 0546  NA 133* 135  K 4.0 4.1  CL 97* 104  CO2 24 24  GLUCOSE 178* 138*  BUN 14 15  CREATININE 0.97 0.93  CALCIUM 9.1 8.4*  MG  --  1.9  PHOS  --  2.9   GFR: Estimated Creatinine Clearance: 76.3 mL/min (by C-G formula based on SCr of 0.93 mg/dL).   Liver Function Tests: Recent Labs  Lab 08/12/18 0928 08/13/18 0546  AST 55* 53*  ALT 92* 73*  ALKPHOS 207* 161*  BILITOT 1.5* 1.5*  PROT 8.1 6.4*  ALBUMIN 3.9 3.0*   Recent Labs  Lab 08/12/18 0928  LIPASE 30   Cardiac Enzymes: Recent Labs  Lab 08/12/18 0928  TROPONINI <0.03   HbA1C: Recent Labs    08/12/18 0928  HGBA1C 6.4*   CBG: Recent Labs  Lab 08/12/18 1827 08/12/18 2127 08/13/18 0030 08/13/18 0752 08/13/18 0932  GLUCAP 141* 130* 133* 112* 102*   Urine analysis:    Component Value Date/Time   COLORURINE YELLOW 08/12/2018 0857   APPEARANCEUR CLEAR 08/12/2018 0857   APPEARANCEUR Clear 05/03/2018 1147   LABSPEC 1.020 08/12/2018 0857   PHURINE 6.5 08/12/2018 0857   GLUCOSEU 250 (A) 08/12/2018 0857   HGBUR NEGATIVE 08/12/2018  0857   BILIRUBINUR NEGATIVE 08/12/2018 0857   BILIRUBINUR Negative 05/03/2018 1147   KETONESUR 15 (A) 08/12/2018 0857   PROTEINUR 30 (A) 08/12/2018 0857   NITRITE NEGATIVE 08/12/2018 0857   LEUKOCYTESUR NEGATIVE 08/12/2018 0857   LEUKOCYTESUR Negative 05/03/2018 1147    Recent Results (from the past 240 hour(s))  Group A Strep by PCR     Status: None   Collection Time: 08/12/18 11:41 AM  Result Value Ref Range Status   Group A Strep by PCR NOT DETECTED NOT DETECTED Final    Comment: Performed at [facility], [address], [place], [place] [address]  Culture, blood (routine x 2)     Status: None (Preliminary result)   Collection Time: 08/12/18  1:17 PM  Result Value Ref Range Status   Specimen Description BLOOD LEFT ANTECUBITAL  Final   Special Requests   Final    BOTTLES DRAWN AEROBIC AND ANAEROBIC Blood Culture adequate volume   Culture   Final    NO GROWTH < 24 HOURS Performed at [facility], [address], [place], [place] [address]    Report Status PENDING  Incomplete  Culture, blood (routine x 2)     Status: None (Preliminary result)   Collection Time: 08/12/18  5:43 PM  Result Value Ref Range Status   Specimen Description BLOOD LEFT ANTECUBITAL  Final   Special Requests   Final    BOTTLES DRAWN AEROBIC AND ANAEROBIC Blood Culture adequate volume   Culture   Final    NO GROWTH < 24 HOURS Performed at [facility], [address], [place], [place] [address]    Report Status PENDING  Incomplete     Radiology Studies: Ct Abdomen Pelvis Wo Contrast  Result Date: 08/12/2018 CLINICAL DATA:  Generalize weakness. Vomiting and nausea over the last 2 weeks. Dementia. EXAM: CT ABDOMEN AND PELVIS WITHOUT CONTRAST TECHNIQUE: Multidetector CT imaging of the abdomen and pelvis was performed following the standard protocol without IV contrast. COMPARISON:  Ultrasound 08/07/2018 FINDINGS: Lower chest: Emphysema and pulmonary scarring. No sign of active infiltrate, mass, effusion or collapse in the region studied. Hepatobiliary: 1.6 cm gallstone dependent in the gallbladder. No focal liver parenchymal finding. Pancreas: Normal Spleen: Normal Adrenals/Urinary Tract: Adrenal glands are normal. Kidneys show a few low-density areas presumed to represent cysts. No evidence of stone, solid mass or obstruction. Bladder is moderately distended but otherwise unremarkable. Stomach/Bowel: No abnormal bowel finding. No evidence of obstruction. No inflammatory change. No evidence of bowel mass.  Vascular/Lymphatic: Aortic atherosclerosis with infrarenal abdominal aortic aneurysm maximal transverse diameter 3.3 cm. IVC is normal. No retroperitoneal adenopathy. Reproductive: Normal Other: No free fluid or air. Musculoskeletal: Old healed compression deformities at L1 and L4. Ordinary lower lumbar facet degeneration and disc degeneration. IMPRESSION: No acute finding to explain the clinical presentation. 1.6 cm calcified gallstone.  No CT evidence of cholecystitis. Moderate distended bladder. Aortic atherosclerosis with infrarenal abdominal aortic aneurysm maximal transverse diameter 3.3 cm. Recommend followup by ultrasound in 3 years. This recommendation follows ACR consensus guidelines: White Paper of the ACR Incidental Findings Committee II on Vascular Findings. J Am Coll Radiol 2013; 10:789-794. Aortic aneurysm NOS (ICD10-I71.9) Electronically Signed   By: Paulina Fusi M.D.   On: 08/12/2018 10:07   Dg Chest 2 View  Result Date: 08/12/2018 CLINICAL DATA:  Abdominal pain.  Generalized weakness. EXAM: CHEST - 2 VIEW COMPARISON:  07/31/2016 insert CT 03/28/2016 FINDINGS: Two views of the chest demonstrate chronic lung changes compatible with emphysema and  large blebs or bulla in the anterior chest. Patchy interstitial densities in both lungs related to chronic changes and probably associated with low lung volumes. Evidence for scarring in the right lower chest. Heart and mediastinum are within normal limits. Atherosclerotic calcifications at the aortic arch. Negative for a pneumothorax. No acute bone abnormality. IMPRESSION: Chronic lung changes compatible with emphysema. Prominent interstitial lung densities in both lungs particularly at the right lung base. Findings could be related to low lung volumes but difficult to exclude subtle infectious process. Electronically Signed   By: Richarda OverlieAdam  Henn M.D.   On: 08/12/2018 10:10   Ct Head Wo Contrast  Result Date: 08/12/2018 CLINICAL DATA:  Generalize  weakness. Nausea and vomiting over the last 2 weeks. Dementia. EXAM: CT HEAD WITHOUT CONTRAST TECHNIQUE: Contiguous axial images were obtained from the base of the skull through the vertex without intravenous contrast. COMPARISON:  06/25/2017 FINDINGS: Brain: Generalized atrophy. Chronic small-vessel ischemic changes throughout the cerebral hemispheric white matter. No sign of acute infarction, mass lesion, hemorrhage or extra-axial collection. Ventricular prominence is felt consistent with central atrophy. Normal pressure hydrocephalus not excluded but not favored. Vascular: There is atherosclerotic calcification of the major vessels at the base of the brain. Skull: Negative Sinuses/Orbits: Mild chronic inflammatory changes of the right maxillary sinus and left sphenoid sinus. Orbits negative. Other: None IMPRESSION: Chronic brain atrophy. Advanced chronic small-vessel ischemic changes of the cerebral hemispheric white matter. Chronic ventriculomegaly presumed secondary to central atrophy. Normal pressure hydrocephalus not excluded but not favored on the basis of the imaging. Electronically Signed   By: Paulina FusiMark  Shogry M.D.   On: 08/12/2018 10:10   [place] Abdomen Limited Ruq  Result Date: 08/12/2018 CLINICAL DATA:  Elevated LFTs with nausea and vomiting 2 weeks. EXAM: ULTRASOUND ABDOMEN LIMITED RIGHT UPPER QUADRANT COMPARISON:  CT today. FINDINGS: Gallbladder: Moderate gallbladder sludge with single shadowing mobile stone measuring 1.5 cm. No significant gallbladder wall thickening. Negative sonographic Murphy sign. No adjacent fluid. Common bile duct: Diameter: 3.0 mm. Liver: Heterogeneous parenchymal echotexture without focal mass. Portal vein is patent on color Doppler imaging with normal direction of blood flow towards the liver. Incidentally noted is a 2.5 cm right renal cyst. IMPRESSION: Moderate gallbladder sludge with mobile 1.5 cm gallstone. No additional sonographic evidence to suggest acute cholecystitis.  Electronically Signed   By: Elberta Fortisaniel  Boyle M.D.   On: 08/12/2018 12:09    Scheduled Meds: . citalopram  10 mg Oral Daily  . heparin  5,000 Units Subcutaneous Q8H  . insulin aspart  0-5 Units Subcutaneous QHS  . insulin aspart  0-9 Units Subcutaneous TID WC  . oseltamivir  75 mg Oral BID  . pantoprazole  40 mg Oral BID  . rivastigmine  4.5 mg Oral BID   Continuous Infusions: . sodium chloride 100 mL/hr at 08/13/18 0935  . ampicillin-sulbactam (UNASYN) IV Stopped (08/13/18 0748)     LOS: 1 day    Time spent: 30 minutes.    Vassie Lollarlos Dechelle Attaway, MD [facility] Pager [phone]  08/13/2018, 11:36 AM

## 2018-08-13 NOTE — ED Notes (Signed)
Pt currently sleeping. This nurse adjusted pillows

## 2018-08-13 NOTE — Consult Note (Signed)
[facility] Consult  Reason for Consult: Cholelithiasis / Intractable Nausea/vomiting  Referring Physician: Dr. Clarene Duke (ED), Dr. Gwenlyn Perking   Chief Complaint    Weakness      Jon [name] is a 73 y.o. male.  HPI: Jon [name] is a 73 yo admitted to the hospital after a 2 week or so course of intractable nausea/vomiting and weakness that resulted in him falling out of bed per his daughter. His daughter is doing most of the talking today and the patient is reported to have some degree of dementia but when I asked about this the daughter quickly hushed me from asking more.  The patient answered only a few questions during the interview.    The daughter reported that they had been to multiple EDs in the past 2 weeks with the complaints and he was not diagnosed with anything at these visits. The wife also has Strep infection and possibly the flu.  The patient was seen in the ED and was worked up for a fever on admission and WBC.  He had a CT that demonstrated some stones and a slight LFT elevation, so that prompted US of the RUQ.  Again, there is no definitive evidence of cholecystitis but he does have stones.  The patient denies any abdominal pain and the daughter denies him complaining about any abdominal pain. His daughter reports no BM in days.   The patient tested positive for the Flu and has concern for PNA on imaging.   Today is the first diet he was able to keep down in the past few days. He has tolerated some full liquids. His history reports an ERCP but I cannot find notes about this or reasons for this in the [facility].  He has been seen by Dr. Arbie Cookey for asymptomatic carotid stenosis and small AAA in the last 6 months. He is being followed with imaging for these issues.      Past Medical History:  Diagnosis Date  . AAA (abdominal aortic aneurysm) (HCC)   . Alzheimer disease (HCC)   . Anemia   . Ataxia   . Diabetes mellitus without complication (HCC)   . History of  gallstones   . Hyperlipidemia   . Hypertension   . Vitamin D deficiency     Past Surgical History:  Procedure Laterality Date  . ERCP    . INGUINAL HERNIA REPAIR    . TONSILLECTOMY      Family History  Problem Relation Age of Onset  . Diabetes Mother   . Hip fracture Mother   . Pulmonary embolism Mother        after hip fracture  . Lupus Sister   . Scleroderma Sister   . Diabetes Brother   . Hyperlipidemia Brother   . Hypertension Brother     Social History   Tobacco Use  . Smoking status: Former Smoker    Packs/day: 1.00    Years: 40.00    Pack years: 40.00    Types: Cigarettes    Start date: 07/03/1966    Last attempt to quit: 02/06/2009    Years since quitting: 9.5  . Smokeless tobacco: Never Used  Substance Use Topics  . Alcohol use: No    Alcohol/week: 0.0 standard drinks  . Drug use: No    Medications:  I have reviewed the patient's current medications. Prior to Admission:  Medications Prior to Admission  Medication Sig Dispense Refill Last Dose  . Acetaminophen (TYLENOL ARTHRITIS EXT RELIEF PO) Take  by mouth.   Taking  . aspirin 81 MG tablet Take 81 mg by mouth daily.   Past Week at Unknown time  . Cholecalciferol (VITAMIN D) 50 MCG (2000 UT) CAPS Take 1 capsule by mouth daily.   Past Week at Unknown time  . citalopram (CELEXA) 10 MG tablet Take 10 mg by mouth daily.   Past Week at Unknown time  . glucose blood test strip Check fasting and 2 hours after biggest meal 200 each 3 Past Week at Unknown time  . Insulin Glargine (LANTUS SOLOSTAR) 100 UNIT/ML Solostar Pen INJECT 45 UNITS DAILY 45 mL 1 Past Week at Unknown time  . lisinopril (PRINIVIL,ZESTRIL) 5 MG tablet Take 1 tablet (5 mg total) by mouth daily. 90 tablet 3 Past Week at Unknown time  . metFORMIN (GLUCOPHAGE) 1000 MG tablet Take 1 tablet (1,000 mg total) by mouth 2 (two) times daily with a meal. 180 tablet 3 Past Week at Unknown time  . omeprazole (PRILOSEC) 20 MG capsule TAKE 1 CAPSULE DAILY 90  capsule 0 Past Week at Unknown time  . promethazine (PHENERGAN) 25 MG tablet    Past Week at Unknown time  . rivastigmine (EXELON) 4.5 MG capsule Take 1 capsule (4.5 mg total) by mouth 2 (two) times daily. 180 capsule 3 Past Week at Unknown time  . SURE COMFORT PEN NEEDLES 32G X 6 MM MISC USE AS DIRECTED 100 each 3 Taking  . ondansetron (ZOFRAN-ODT) 8 MG disintegrating tablet Take 1 tablet (8 mg total) by mouth every 6 (six) hours as needed for nausea or vomiting. (Patient not taking: Reported on 08/12/2018) 20 tablet 1 Completed Course at Unknown time  . TRULICITY 1.5 MG/0.5ML SOPN INJECT 1.5 MG INTO THE SKIN ONCE A WEEK (Patient not taking: INJECT 1.5 MG INTO THE SKIN ONCE A WEEK) 6 mL 1 Not Taking   Scheduled: . citalopram  10 mg Oral Daily  . heparin  5,000 Units Subcutaneous Q8H  . insulin aspart  0-5 Units Subcutaneous QHS  . insulin aspart  0-9 Units Subcutaneous TID WC  . oseltamivir  75 mg Oral BID  . pantoprazole  40 mg Oral BID  . rivastigmine  4.5 mg Oral BID   Continuous: . sodium chloride 100 mL/hr at 08/13/18 0935  . ampicillin-sulbactam (UNASYN) IV Stopped (08/13/18 0748)   EAV:[id number] **OR** acetaminophen, hydrALAZINE, morphine injection, prochlorperazine, promethazine  Allergies  Allergen Reactions  . Donepezil Nausea And Vomiting  . Namenda [Memantine Hcl] Other (See Comments)    Sleeps all the time    ROS:  A comprehensive review of systems was negative except for: Constitutional: positive for fatigue, fevers and malaise Gastrointestinal: positive for constipation, nausea and vomiting  Blood pressure 120/60, pulse 94, temperature 99.8 F (37.7 C), temperature source Oral, resp. rate 20, height 5\' 10"  (1.778 m), weight 81 kg, SpO2 90 %. Physical Exam Vitals signs reviewed.  Constitutional:      Appearance: He is normal weight.  HENT:     Head: Normocephalic.     Mouth/Throat:     Mouth: Mucous membranes are moist.  Eyes:     Extraocular Movements:  Extraocular movements intact.  Neck:     Musculoskeletal: Normal range of motion.  Cardiovascular:     Rate and Rhythm: Normal rate.  Pulmonary:     Effort: Pulmonary effort is normal.     Comments: + cough  Abdominal:     General: There is distension.     Palpations: Abdomen is soft.  Tenderness: There is no abdominal tenderness.     Hernia: No hernia is present.  Musculoskeletal:        General: No swelling.  Skin:    General: Skin is warm and dry.  Neurological:     Mental Status: He is alert. Mental status is at baseline.     Comments: Oriented to self, hospital, president, not oriented to date/ year or exact location   Psychiatric:        Mood and Affect: Mood normal.        Behavior: Behavior normal.        Thought Content: Thought content normal.        Judgment: Judgment normal.     Results: Results for orders placed or performed during the hospital encounter of 08/12/18 (from the past 48 hour(s))  Urinalysis, Routine w reflex microscopic     Status: Abnormal   Collection Time: 08/12/18  8:57 AM  Result Value Ref Range   Color, Urine YELLOW YELLOW   APPearance CLEAR CLEAR   Specific Gravity, Urine 1.020 1.005 - 1.030   pH 6.5 5.0 - 8.0   Glucose, UA 250 (A) NEGATIVE mg/dL   Hgb urine dipstick NEGATIVE NEGATIVE   Bilirubin Urine NEGATIVE NEGATIVE   Ketones, ur 15 (A) NEGATIVE mg/dL   Protein, ur 30 (A) NEGATIVE mg/dL   Nitrite NEGATIVE NEGATIVE   Leukocytes, UA NEGATIVE NEGATIVE    Comment: Performed at [facility], [address] [place], [place] [address]  Urinalysis, Microscopic (reflex)     Status: None   Collection Time: 08/12/18  8:57 AM  Result Value Ref Range   RBC / HPF 0-5 0 - 5 RBC/hpf   WBC, UA NONE SEEN 0 - 5 WBC/hpf   Bacteria, UA NONE SEEN NONE SEEN   Squamous Epithelial / LPF NONE SEEN 0 - 5    Comment: Performed at [facility], [address] [place], [place] [address]  Urine culture     Status: None   Collection Time: 08/12/18  9:08  AM  Result Value Ref Range   Specimen Description      URINE, RANDOM Performed at [facility], [address] [place], [place] [address]    Special Requests      NONE Performed at [facility], [address] [place], [place] [address]    Culture      NO GROWTH Performed at [facility], [address] [place], [place] [address]    Report Status 08/13/2018 FINAL   CBC with Differential     Status: Abnormal   Collection Time: 08/12/18  9:28 AM  Result Value Ref Range   WBC 14.2 (H) 4.0 - 10.5 K/uL   RBC 5.57 4.22 - 5.81 MIL/uL   Hemoglobin 17.1 (H) 13.0 - 17.0 g/dL   HCT 13.050.8 86.539.0 - 78.452.0 %   MCV 91.2 80.0 - 100.0 fL   MCH 30.7 26.0 - 34.0 pg   MCHC 33.7 30.0 - 36.0 g/dL   RDW 69.612.9 29.511.5 - 28.415.5 %   Platelets 168 150 - 400 K/uL   nRBC 0.0 0.0 - 0.2 %   Neutrophils Relative % 79 %   Neutro Abs 11.3 (H) 1.7 - 7.7 K/uL   Lymphocytes Relative 10 %   Lymphs Abs 1.4 0.7 - 4.0 K/uL   Monocytes Relative 10 %   Monocytes Absolute 1.4 (H) 0.1 - 1.0 K/uL   Eosinophils Relative 0 %   Eosinophils Absolute 0.0 0.0 - 0.5 K/uL  Basophils Relative 0 %   Basophils Absolute 0.0 0.0 - 0.1 K/uL   Immature Granulocytes 1 %   Abs Immature Granulocytes 0.08 (H) 0.00 - 0.07 K/uL    Comment: Performed at [facility], [address] [place], [place] [address]  Comprehensive metabolic panel     Status: Abnormal   Collection Time: 08/12/18  9:28 AM  Result Value Ref Range   Sodium 133 (L) 135 - 145 mmol/L   Potassium 4.0 3.5 - 5.1 mmol/L   Chloride 97 (L) 98 - 111 mmol/L   CO2 24 22 - 32 mmol/L   Glucose, Bld 178 (H) 70 - 99 mg/dL   BUN 14 8 - 23 mg/dL   Creatinine, Ser 6.04 0.61 - 1.24 mg/dL   Calcium 9.1 8.9 - 54.0 mg/dL   Total Protein 8.1 6.5 - 8.1 g/dL   Albumin 3.9 3.5 - 5.0 g/dL   AST 55 (H) 15 - 41 U/L   ALT 92 (H) 0 - 44 U/L   Alkaline Phosphatase 207 (H) 38 - 126 U/L   Total Bilirubin 1.5 (H) 0.3 - 1.2 mg/dL   GFR calc non Af Amer >60 >60 mL/min   GFR calc Af Amer >60  >60 mL/min   Anion gap 12 5 - 15    Comment: Performed at [facility], [address], [place], [place] [address]  Troponin I - Once     Status: None   Collection Time: 08/12/18  9:28 AM  Result Value Ref Range   Troponin I <0.03 <0.03 ng/mL    Comment: Performed at [facility], [address], [place], [place] [address]  Lipase, blood     Status: None   Collection Time: 08/12/18  9:28 AM  Result Value Ref Range   Lipase 30 11 - 51 U/L    Comment: Performed at [facility], [address], [place], [place] [address]  Hemoglobin A1c     Status: Abnormal   Collection Time: 08/12/18  9:28 AM  Result Value Ref Range   Hgb A1c MFr Bld 6.4 (H) 4.8 - 5.6 %    Comment: (NOTE) Pre diabetes:          5.7%-6.4% Diabetes:              >6.4% Glycemic control for   <7.0% adults with diabetes    Mean Plasma Glucose 136.98 mg/dL    Comment: Performed at [facility], [address] [place], [place] [address]  Group A Strep by PCR     Status: None   Collection Time: 08/12/18 11:41 AM  Result Value Ref Range   Group A Strep by PCR NOT DETECTED NOT DETECTED    Comment: Performed at [facility], [address], [place], [place] [address]  Influenza panel by PCR (type A & B)     Status: Abnormal   Collection Time: 08/12/18 12:57 PM  Result Value Ref Range   Influenza A By PCR POSITIVE (A) NEGATIVE   Influenza B By PCR NEGATIVE NEGATIVE    Comment: (NOTE) The Xpert Xpress Flu assay is intended as an aid in the diagnosis of  influenza and should not be used as a sole basis for treatment.  This  assay is FDA approved for nasopharyngeal swab specimens only. Nasal  washings and aspirates are unacceptable for Xpert Xpress Flu testing. Performed at [facility], [address], [place], [place] [address]   Culture, blood (routine x 2)     Status: None (  Preliminary result)   Collection Time: 08/12/18  1:17 PM  Result Value Ref Range   Specimen Description BLOOD LEFT ANTECUBITAL    Special  Requests      BOTTLES DRAWN AEROBIC AND ANAEROBIC Blood Culture adequate volume   Culture      NO GROWTH < 24 HOURS Performed at [facility], [address] [place], [place] [address]    Report Status PENDING   Culture, blood (routine x 2)     Status: None (Preliminary result)   Collection Time: 08/12/18  5:43 PM  Result Value Ref Range   Specimen Description BLOOD LEFT ANTECUBITAL    Special Requests      BOTTLES DRAWN AEROBIC AND ANAEROBIC Blood Culture adequate volume   Culture      NO GROWTH < 24 HOURS Performed at [facility], [address], [place], [place] [address]    Report Status PENDING   HIV antibody (Routine Testing)     Status: None   Collection Time: 08/12/18  5:43 PM  Result Value Ref Range   HIV Screen 4th Generation wRfx Non Reactive Non Reactive    Comment: (NOTE) Performed At: [facility] [address] [place], [place] [id number] Jolene Schimke MD YY:[id number]   Glucose, capillary     Status: Abnormal   Collection Time: 08/12/18  6:27 PM  Result Value Ref Range   Glucose-Capillary 141 (H) 70 - 99 mg/dL   Comment 1 Notify RN   CBG monitoring, ED     Status: Abnormal   Collection Time: 08/12/18  9:27 PM  Result Value Ref Range   Glucose-Capillary 130 (H) 70 - 99 mg/dL  CBG monitoring, ED     Status: Abnormal   Collection Time: 08/13/18 12:30 AM  Result Value Ref Range   Glucose-Capillary 133 (H) 70 - 99 mg/dL  Magnesium     Status: None   Collection Time: 08/13/18  5:46 AM  Result Value Ref Range   Magnesium 1.9 1.7 - 2.4 mg/dL    Comment: Performed at [facility], [address] [place], [place] [address]  Phosphorus     Status: None   Collection Time: 08/13/18  5:46 AM  Result Value Ref Range   Phosphorus 2.9 2.5 - 4.6 mg/dL    Comment: Performed at [facility], [address] [place], [place] [address]  Comprehensive metabolic panel     Status: Abnormal   Collection Time: 08/13/18  5:46 AM  Result Value Ref Range   Sodium 135  135 - 145 mmol/L   Potassium 4.1 3.5 - 5.1 mmol/L   Chloride 104 98 - 111 mmol/L   CO2 24 22 - 32 mmol/L   Glucose, Bld 138 (H) 70 - 99 mg/dL   BUN 15 8 - 23 mg/dL   Creatinine, Ser 9.44 0.61 - 1.24 mg/dL   Calcium 8.4 (L) 8.9 - 10.3 mg/dL   Total Protein 6.4 (L) 6.5 - 8.1 g/dL   Albumin 3.0 (L) 3.5 - 5.0 g/dL   AST 53 (H) 15 - 41 U/L   ALT 73 (H) 0 - 44 U/L   Alkaline Phosphatase 161 (H) 38 - 126 U/L   Total Bilirubin 1.5 (H) 0.3 - 1.2 mg/dL   GFR calc non Af Amer >60 >60 mL/min   GFR calc Af Amer >60 >60 mL/min   Anion gap 7 5 - 15    Comment: Performed at [facility], [address] [place], [place] [address]  CBC     Status:  Abnormal   Collection Time: 08/13/18  5:46 AM  Result Value Ref Range   WBC 12.0 (H) 4.0 - 10.5 K/uL   RBC 4.70 4.22 - 5.81 MIL/uL   Hemoglobin 14.2 13.0 - 17.0 g/dL   HCT 16.143.2 09.639.0 - 04.552.0 %   MCV 91.9 80.0 - 100.0 fL   MCH 30.2 26.0 - 34.0 pg   MCHC 32.9 30.0 - 36.0 g/dL   RDW 40.913.2 81.111.5 - 91.415.5 %   Platelets 140 (L) 150 - 400 K/uL   nRBC 0.0 0.0 - 0.2 %    Comment: Performed at [facility], [address] [place], [place] [address]  CBG monitoring, ED     Status: Abnormal   Collection Time: 08/13/18  7:52 AM  Result Value Ref Range   Glucose-Capillary 112 (H) 70 - 99 mg/dL  Glucose, capillary     Status: Abnormal   Collection Time: 08/13/18  9:32 AM  Result Value Ref Range   Glucose-Capillary 102 (H) 70 - 99 mg/dL   Personally reviewed- CT with distended gallbladder no evidence of cholecystitis, US with stones and no thickening, CBD 3 mm  Ct Abdomen Pelvis Wo Contrast  Result Date: 08/12/2018 CLINICAL DATA:  Generalize weakness. Vomiting and nausea over the last 2 weeks. Dementia. EXAM: CT ABDOMEN AND PELVIS WITHOUT CONTRAST TECHNIQUE: Multidetector CT imaging of the abdomen and pelvis was performed following the standard protocol without IV contrast. COMPARISON:  Ultrasound 08/07/2018 FINDINGS: Lower chest: Emphysema and pulmonary scarring. No  sign of active infiltrate, mass, effusion or collapse in the region studied. Hepatobiliary: 1.6 cm gallstone dependent in the gallbladder. No focal liver parenchymal finding. Pancreas: Normal Spleen: Normal Adrenals/Urinary Tract: Adrenal glands are normal. Kidneys show a few low-density areas presumed to represent cysts. No evidence of stone, solid mass or obstruction. Bladder is moderately distended but otherwise unremarkable. Stomach/Bowel: No abnormal bowel finding. No evidence of obstruction. No inflammatory change. No evidence of bowel mass. Vascular/Lymphatic: Aortic atherosclerosis with infrarenal abdominal aortic aneurysm maximal transverse diameter 3.3 cm. IVC is normal. No retroperitoneal adenopathy. Reproductive: Normal Other: No free fluid or air. Musculoskeletal: Old healed compression deformities at L1 and L4. Ordinary lower lumbar facet degeneration and disc degeneration. IMPRESSION: No acute finding to explain the clinical presentation. 1.6 cm calcified gallstone.  No CT evidence of cholecystitis. Moderate distended bladder. Aortic atherosclerosis with infrarenal abdominal aortic aneurysm maximal transverse diameter 3.3 cm. Recommend followup by ultrasound in 3 years. This recommendation follows ACR consensus guidelines: White Paper of the ACR Incidental Findings Committee II on Vascular Findings. J Am Coll Radiol 2013; 10:789-794. Aortic aneurysm NOS (ICD10-I71.9) Electronically Signed   By: Paulina FusiMark  Shogry M.D.   On: 08/12/2018 10:07   Dg Chest 2 View  Result Date: 08/12/2018 CLINICAL DATA:  Abdominal pain.  Generalized weakness. EXAM: CHEST - 2 VIEW COMPARISON:  07/31/2016 insert CT 03/28/2016 FINDINGS: Two views of the chest demonstrate chronic lung changes compatible with emphysema and large blebs or bulla in the anterior chest. Patchy interstitial densities in both lungs related to chronic changes and probably associated with low lung volumes. Evidence for scarring in the right lower chest.  Heart and mediastinum are within normal limits. Atherosclerotic calcifications at the aortic arch. Negative for a pneumothorax. No acute bone abnormality. IMPRESSION: Chronic lung changes compatible with emphysema. Prominent interstitial lung densities in both lungs particularly at the right lung base. Findings could be related to low lung volumes but difficult to exclude subtle infectious process. Electronically Signed   By: Madelaine BhatAdam  Lowella Dandy M.D.   On: 08/12/2018 10:10   Ct Head Wo Contrast  Result Date: 08/12/2018 CLINICAL DATA:  Generalize weakness. Nausea and vomiting over the last 2 weeks. Dementia. EXAM: CT HEAD WITHOUT CONTRAST TECHNIQUE: Contiguous axial images were obtained from the base of the skull through the vertex without intravenous contrast. COMPARISON:  06/25/2017 FINDINGS: Brain: Generalized atrophy. Chronic small-vessel ischemic changes throughout the cerebral hemispheric white matter. No sign of acute infarction, mass lesion, hemorrhage or extra-axial collection. Ventricular prominence is felt consistent with central atrophy. Normal pressure hydrocephalus not excluded but not favored. Vascular: There is atherosclerotic calcification of the major vessels at the base of the brain. Skull: Negative Sinuses/Orbits: Mild chronic inflammatory changes of the right maxillary sinus and left sphenoid sinus. Orbits negative. Other: None IMPRESSION: Chronic brain atrophy. Advanced chronic small-vessel ischemic changes of the cerebral hemispheric white matter. Chronic ventriculomegaly presumed secondary to central atrophy. Normal pressure hydrocephalus not excluded but not favored on the basis of the imaging. Electronically Signed   By: Paulina Fusi M.D.   On: 08/12/2018 10:10   US Abdomen Limited Ruq  Result Date: 08/12/2018 CLINICAL DATA:  Elevated LFTs with nausea and vomiting 2 weeks. EXAM: ULTRASOUND ABDOMEN LIMITED RIGHT UPPER QUADRANT COMPARISON:  CT today. FINDINGS: Gallbladder: Moderate gallbladder  sludge with single shadowing mobile stone measuring 1.5 cm. No significant gallbladder wall thickening. Negative sonographic Murphy sign. No adjacent fluid. Common bile duct: Diameter: 3.0 mm. Liver: Heterogeneous parenchymal echotexture without focal mass. Portal vein is patent on color Doppler imaging with normal direction of blood flow towards the liver. Incidentally noted is a 2.5 cm right renal cyst. IMPRESSION: Moderate gallbladder sludge with mobile 1.5 cm gallstone. No additional sonographic evidence to suggest acute cholecystitis. Electronically Signed   By: Elberta Fortis M.D.   On: 08/12/2018 12:09     Assessment & Plan:  RIGO LARKE is a 73 y.o. male with symptoms likely from the flu and pneumonia but does have a distended gallbladder on imaging but no overt signs of cholecystitis. He was not tender on my exam, but other exams have demonstrated some tenderness.  At this time he is on antibiotics so it is difficult to say if the gallbladder could be contributing and is improving.  The presentation has been going on for 2 weeks or so now and surgical intervention is going to be very risky especially in the setting of the flu and pneumonia.  Would recommend outpatient follow up to discuss the need for any cholecystectomy in the future.  Given the concern for possible cholecystitis given LFTs and tenderness he did exhibit, a HIDA scan can be done to rule out cholecystitis and can also aid with the outpatient discussion. If it is negative, then we can follow up and will likely not perform a cholecystectomy unless he develops other symptoms, but if positive we would know that he would ultimately need it out once he is better from his acute infections (flu/ PNA).    - Will discuss HIDA scan with Dr. Gwenlyn Perking, this could help for outpatient planning, no plans for surgical intervention this hospitalization  - Trend LFTs, no evidence of CBD stone, likely is secondary to dehydration and viral illness  -  Will follow while in patient - Would adv diet as tolerates, did well with fulls today   All questions were answered to the satisfaction of the patient and family.  Lucretia Roers 08/13/2018, 2:30 PM

## 2018-08-14 LAB — COMPREHENSIVE METABOLIC PANEL
ALK PHOS: 190 U/L — AB (ref 38–126)
ALT: 88 U/L — ABNORMAL HIGH (ref 0–44)
ANION GAP: 8 (ref 5–15)
AST: 72 U/L — ABNORMAL HIGH (ref 15–41)
Albumin: 2.7 g/dL — ABNORMAL LOW (ref 3.5–5.0)
BUN: 11 mg/dL (ref 8–23)
CO2: 23 mmol/L (ref 22–32)
Calcium: 8 mg/dL — ABNORMAL LOW (ref 8.9–10.3)
Chloride: 104 mmol/L (ref 98–111)
Creatinine, Ser: 0.77 mg/dL (ref 0.61–1.24)
GFR calc Af Amer: >60 mL/min (ref >60)
GFR calc non Af Amer: >60 mL/min (ref >60)
Glucose, Bld: 147 mg/dL — ABNORMAL HIGH (ref 70–99)
Potassium: 3.9 mmol/L (ref 3.5–5.1)
Sodium: 135 mmol/L (ref 135–145)
Total Bilirubin: 1.5 mg/dL — ABNORMAL HIGH (ref 0.3–1.2)
Total Protein: 6 g/dL — ABNORMAL LOW (ref 6.5–8.1)

## 2018-08-14 LAB — GLUCOSE, CAPILLARY
Glucose-Capillary: 132 mg/dL — ABNORMAL HIGH (ref 70–99)
Glucose-Capillary: 242 mg/dL — ABNORMAL HIGH (ref 70–99)
Glucose-Capillary: 86 mg/dL (ref 70–99)
Glucose-Capillary: 144 mg/dL — ABNORMAL HIGH (ref 70–99)

## 2018-08-14 MED ORDER — SODIUM CHLORIDE 0.9 % IV BOLUS
500.0000 mL | Freq: Once | INTRAVENOUS | Status: AC
  Administered 2018-08-14: 500 mL via INTRAVENOUS

## 2018-08-14 NOTE — Progress Notes (Signed)
Received 500 mL NS bolus from Schoor NP. Order implemented. Will continue to monitor.

## 2018-08-14 NOTE — Progress Notes (Signed)
[facility] Progress Note     Subjective: Tolerating some liquids. Had some emesis last night but was choked up.  No abdominal pain reported.   Objective: Vital signs in last 24 hours: Temp:  [98.2 F (36.8 C)-102.2 F (39 C)] 98.6 F (37 C) (02/12 1328) Pulse Rate:  [71-96] 75 (02/12 1328) Resp:  [18] 18 (02/12 1328) BP: (112-142)/(65-70) 112/70 (02/12 1328) SpO2:  [89 %-94 %] 94 % (02/12 1328) Last BM Date: 08/12/18  Intake/Output from previous day: 02/11 0701 - 02/12 0700 In: 2205 [P.O.:300; I.V.:1000; IV Piggyback:905] Out: 950 [Urine:950] Intake/Output this shift: Total I/O In: 360 [P.O.:360] Out: -   General appearance: alert, cooperative and no distress GI: soft, mildly distended, nontender  Lab Results:  Recent Labs    08/12/18 0928 08/13/18 0546  WBC 14.2* 12.0*  HGB 17.1* 14.2  HCT 50.8 43.2  PLT 168 140*   BMET Recent Labs    08/13/18 0546 08/14/18 0535  NA 135 135  K 4.1 3.9  CL 104 104  CO2 24 23  GLUCOSE 138* 147*  BUN 15 11  CREATININE 0.93 0.77  CALCIUM 8.4* 8.0*   Anti-infectives: Anti-infectives (From admission, onward)   Start     Dose/Rate Route Frequency Ordered Stop   08/12/18 2200  oseltamivir (TAMIFLU) capsule 30 mg  Status:  Discontinued     30 mg Oral 2 times daily 08/12/18 1737 08/12/18 1805   08/12/18 2200  oseltamivir (TAMIFLU) capsule 75 mg     75 mg Oral 2 times daily 08/12/18 1805 08/17/18 2159   08/12/18 1330  Ampicillin-Sulbactam (UNASYN) 3 g in sodium chloride 0.9 % 100 mL IVPB     3 g 200 mL/hr over 30 Minutes Intravenous Every 8 hours 08/12/18 1313        Assessment/Plan: Jon [name] is a 73 yo with diabetes, and intractable nausea/vomiting possibly related to Flu but also with LFTs elevation and stones on imaging. No findings of cholecystitis, but given the odd symptoms and diabetes he could have some cholecystitis going on.  -HIDA  Scan to rule out cholecystitis  -Would plan for  cholecystectomy outpatient once better from flu if HIDA positive  -Discussed with Dr. Sherryll Burger.    LOS: 2 days    Jon [name] 08/14/2018

## 2018-08-14 NOTE — Progress Notes (Signed)
PROGRESS NOTE    Jon [name]  CBJ:[id number] DOB: 11-24-1945 DOA: 08/12/2018 PCP: Mechele Claude, MD   Brief Narrative:  73 y.o.malewith a past medical history significant for abdominal aortic aneurysm, dementia with behavioral disturbances, type 2 diabetes mellitus with hyperglycemia, hypertensionandhyperlipidemia;who presented to the hospital secondary to intractable nausea/vomiting, fever, inability to keep anything down and complaining of abdominal pain. Apparently symptoms has been present for the last week and a half and worsening. Food makes his symptoms worse and at this moment he is having trouble even keeping his medications down. Patient ended experiencing a fall at home prior to coming to the hospital due to generalized weakness associated with ongoing symptoms and dehydration. Per reports by family at bedside they have visited a different emergency department where work-up demonstrated concern for biliary abnormalities as probably 1 of the main reasons for his symptoms. At that time patient declines any further work-up and ended going home. Symptom has continue progressing and given inability to keep anything down they came once again to the hospital for further evaluation and management.  In ED patient was found to have a fever of 102, elevated WBCs, signs of dehydration with hemoconcentrated samples and elevated hemoglobin level. Chest x-ray demonstrated bibasilar infiltrates and bronchitic changes. Patient actively throwing up in the ED. He had elevated LFTs and a bilirubin of 1.5. CT abdomen demonstrated cholelithiasis right upper quadrant ultrasound with cholelithiasis and gallbladder sludge. Patient was also found to be positive for influenza A. Antiemetics and fluid resuscitation given in the ED. he continues to have LFTs that are trending up at this time.   Assessment & Plan:   Principal Problem:   Intractable nausea and vomiting Active Problems:  Uncontrolled type 2 diabetes mellitus with complication, with long-term current use of insulin (HCC)   HTN (hypertension)   AAA (abdominal aortic aneurysm) without rupture (HCC)   Hyperlipidemia   Lewy body dementia with behavioral disturbance (HCC)   Elevated LFTs   Calculus of gallbladder without cholecystitis without obstruction  1-intractable nausea and vomiting-with transaminitis:  -Multifactorial: With concern for biliary colic/cholelithiasis versus influenza A infection and aspiration PNA. -Continue to monitor LFTs and bilirubin that are up trending. -CT abdomen demonstrated a mobile gallstone 1.5 cm and also a sludge in his gallbladder ultrasound. -General surgery consultation appreciated. -Will allow full liquid diet, continue IV fluids, continue as needed antiemetics and analgesics. -Continue Tamiflu as part of the treatment for influenza and will continue the use of Unasyn to cover biliary infection along with aspiration pneumonia. -follow clinical response.  2-influenza A -Continue Tamiflu -Continue supportive care and fluid resuscitation.  3-pneumonia: Appreciated bilateral infiltrates on x-ray, elevated WBCs, fever and productive cough. -Given history of ongoing intractable vomiting high concern for aspiration -Continue oxygen supplementation -Continue as needed DuoNeb -Continue antibiotic treatment with Unasyn  4-uncontrolled type 2 diabetes mellitus -Continue holding hypoglycemic agents while inpatient -Continue sliding scale insulin. -Check CBGs QAC/nightly.  5-hypertension -Continue holding oral antihypertensive medication regimen-continue as needed hydralazine. -Blood pressure stable.  6-AAA without rupture -Transverse diameter 3.3 cm -Recommend follow-up by ultrasound in 3 years.  7-hyperlipidemia/elevated LFTs -Will continue holding statin for now -LFTs slightly trending down -Continue fluid resuscitation. -Follow recommendation by general  surgery.  8-dementia with behavioral disturbances -Continue supportive care -Continue the use of Celexa and Exelon.   DVT prophylaxis: Heparin Code Status: Full code Family Communication: Daughter-in-law at bedside Disposition Plan: Continue IV fluid as well as IV antibiotics with supportive care and diet advancement as tolerated.  Appreciate further general surgery evaluation with possible need for HIDA scan.  Trend LFTs.   Consultants:   General surgery  Procedures:   See below for x-ray reports  Antimicrobials:   Tamiflu 2/10->  Unasyn 2/10->   Subjective: Patient seen and evaluated today with no new acute complaints or concerns. No acute concerns or events noted overnight.  He continues to have some mild nausea and is barely tolerating his current diet.  He has not had a bowel movement in 2 days and denies any abdominal pain.  Objective: Vitals:   08/13/18 2137 08/13/18 2216 08/13/18 2257 08/14/18 0551  BP: (!) 142/65   118/66  Pulse: 96   71  Resp: 18   18  Temp: (!) 102.2 F (39 C)  99 F (37.2 C) 98.2 F (36.8 C)  TempSrc: Oral  Oral Oral  SpO2: (!) 89% 92%  93%  Weight:      Height:        Intake/Output Summary (Last 24 hours) at 08/14/2018 1226 Last data filed at 08/14/2018 0900 Gross per 24 hour  Intake 1620 ml  Output 950 ml  Net 670 ml   Filed Weights   08/12/18 0853  Weight: 81 kg    Examination:  General exam: Appears calm and comfortable  Respiratory system: Clear to auscultation. Respiratory effort normal.  Currently on 2 L nasal cannula. Cardiovascular system: S1 & S2 heard, RRR. No JVD, murmurs, rubs, gallops or clicks. No pedal edema. Gastrointestinal system: Abdomen is nondistended, soft and nontender. No organomegaly or masses felt. Normal bowel sounds heard. Central nervous system: Alert and oriented. No focal neurological deficits. Extremities: Symmetric 5 x 5 power. Skin: No rashes, lesions or ulcers Psychiatry: Judgement and  insight appear normal. Mood & affect appropriate.     Data Reviewed: I have personally reviewed following labs and imaging studies  CBC: Recent Labs  Lab 08/12/18 0928 08/13/18 0546  WBC 14.2* 12.0*  NEUTROABS 11.3*  --   HGB 17.1* 14.2  HCT 50.8 43.2  MCV 91.2 91.9  PLT 168 140*   Basic Metabolic Panel: Recent Labs  Lab 08/12/18 0928 08/13/18 0546 08/14/18 0535  NA 133* 135 135  K 4.0 4.1 3.9  CL 97* 104 104  CO2 24 24 23   GLUCOSE 178* 138* 147*  BUN 14 15 11   CREATININE 0.97 0.93 0.77  CALCIUM 9.1 8.4* 8.0*  MG  --  1.9  --   PHOS  --  2.9  --    GFR: Estimated Creatinine Clearance: 88.7 mL/min (by C-G formula based on SCr of 0.77 mg/dL). Liver Function Tests: Recent Labs  Lab 08/12/18 0928 08/13/18 0546 08/14/18 0535  AST 55* 53* 72*  ALT 92* 73* 88*  ALKPHOS 207* 161* 190*  BILITOT 1.5* 1.5* 1.5*  PROT 8.1 6.4* 6.0*  ALBUMIN 3.9 3.0* 2.7*   Recent Labs  Lab 08/12/18 0928  LIPASE 30   No results for input(s): AMMONIA in the last 168 hours. Coagulation Profile: No results for input(s): INR, PROTIME in the last 168 hours. Cardiac Enzymes: Recent Labs  Lab 08/12/18 0928  TROPONINI <0.03   BNP (last 3 results) No results for input(s): PROBNP in the last 8760 hours. HbA1C: Recent Labs    08/12/18 0928  HGBA1C 6.4*   CBG: Recent Labs  Lab 08/13/18 0932 08/13/18 1614 08/13/18 2018 08/14/18 0741 08/14/18 1107  GLUCAP 102* 194* 191* 132* 242*   Lipid Profile: No results for input(s): CHOL, HDL, LDLCALC, TRIG, CHOLHDL, LDLDIRECT  in the last 72 hours. Thyroid Function Tests: No results for input(s): TSH, T4TOTAL, FREET4, T3FREE, THYROIDAB in the last 72 hours. Anemia Panel: No results for input(s): VITAMINB12, FOLATE, FERRITIN, TIBC, IRON, RETICCTPCT in the last 72 hours. Sepsis Labs: No results for input(s): PROCALCITON, LATICACIDVEN in the last 168 hours.  Recent Results (from the past 240 hour(s))  Urine culture     Status: None     Collection Time: 08/12/18  9:08 AM  Result Value Ref Range Status   Specimen Description   Final    URINE, RANDOM Performed at [facility], [address], [place], [place] [address]    Special Requests   Final    NONE Performed at [facility], [address], [place], [place] [address]    Culture   Final    NO GROWTH Performed at [facility], [address], [place], [place] [address]    Report Status 08/13/2018 FINAL  Final  Group A Strep by PCR     Status: None   Collection Time: 08/12/18 11:41 AM  Result Value Ref Range Status   Group A Strep by PCR NOT DETECTED NOT DETECTED Final    Comment: Performed at [facility], [address] [place], [place] [address]  Culture, blood (routine x 2)     Status: None (Preliminary result)   Collection Time: 08/12/18  1:17 PM  Result Value Ref Range Status   Specimen Description BLOOD LEFT ANTECUBITAL  Final   Special Requests   Final    BOTTLES DRAWN AEROBIC AND ANAEROBIC Blood Culture adequate volume   Culture   Final    NO GROWTH 2 DAYS Performed at [facility], [address] [place], [place] [address]    Report Status PENDING  Incomplete  Culture, blood (routine x 2)     Status: None (Preliminary result)   Collection Time: 08/12/18  5:43 PM  Result Value Ref Range Status   Specimen Description BLOOD LEFT ANTECUBITAL  Final   Special Requests   Final    BOTTLES DRAWN AEROBIC AND ANAEROBIC Blood Culture adequate volume   Culture   Final    NO GROWTH 2 DAYS Performed at [facility], [address], [place], [place] [address]    Report Status PENDING  Incomplete         Radiology Studies: No results found.      Scheduled Meds: . citalopram  10 mg Oral Daily  . heparin  5,000 Units Subcutaneous Q8H  . insulin aspart  0-5 Units Subcutaneous QHS  . insulin aspart  0-9 Units Subcutaneous TID WC  . oseltamivir  75 mg Oral BID  . pantoprazole  40 mg Oral BID  . rivastigmine  4.5 mg Oral BID   Continuous  Infusions: . sodium chloride 100 mL/hr at 08/14/18 0557  . ampicillin-sulbactam (UNASYN) IV 3 g (08/14/18 0553)     LOS: 2 days    Time spent: 30 minutes    Joleene Burnham Hoover Brunette, DO [facility] Pager [phone]  If 7PM-7AM, please contact night-coverage [email or web address] Password [facility] 08/14/2018, 12:26 PM

## 2018-08-14 NOTE — Progress Notes (Signed)
Patient spiked temp of 102 oral, drowsy and was diaphoretic. CBG was 191 mg/dL. Temp came down to 99 after Tylenol 650 mg oral. Schoor NP paged without receiving a call back.  Patient is resting quietly at thie time with his eyes closed, respirations even and unlabored. Will continue to monitor.

## 2018-08-15 ENCOUNTER — Inpatient Hospital Stay (HOSPITAL_COMMUNITY): Payer: Medicare Other

## 2018-08-15 ENCOUNTER — Encounter (HOSPITAL_COMMUNITY): Payer: Self-pay

## 2018-08-15 LAB — CBC
HCT: 40.5 % (ref 39.0–52.0)
Hemoglobin: 13.5 g/dL (ref 13.0–17.0)
MCH: 30.6 pg (ref 26.0–34.0)
MCHC: 33.3 g/dL (ref 30.0–36.0)
MCV: 91.8 fL (ref 80.0–100.0)
PLATELETS: 143 10*3/uL — AB (ref 150–400)
RBC: 4.41 MIL/uL (ref 4.22–5.81)
RDW: 12.9 % (ref 11.5–15.5)
WBC: 8.5 10*3/uL (ref 4.0–10.5)
nRBC: 0 % (ref 0.0–0.2)

## 2018-08-15 LAB — COMPREHENSIVE METABOLIC PANEL
ALT: 95 U/L — ABNORMAL HIGH (ref 0–44)
AST: 81 U/L — ABNORMAL HIGH (ref 15–41)
Albumin: 2.7 g/dL — ABNORMAL LOW (ref 3.5–5.0)
Alkaline Phosphatase: 223 U/L — ABNORMAL HIGH (ref 38–126)
Anion gap: 8 (ref 5–15)
BUN: 10 mg/dL (ref 8–23)
CHLORIDE: 104 mmol/L (ref 98–111)
CO2: 22 mmol/L (ref 22–32)
Calcium: 7.9 mg/dL — ABNORMAL LOW (ref 8.9–10.3)
Creatinine, Ser: 0.63 mg/dL (ref 0.61–1.24)
GFR calc non Af Amer: >60 mL/min (ref >60)
Glucose, Bld: 118 mg/dL — ABNORMAL HIGH (ref 70–99)
Potassium: 3.3 mmol/L — ABNORMAL LOW (ref 3.5–5.1)
Sodium: 134 mmol/L — ABNORMAL LOW (ref 135–145)
Total Bilirubin: 1.2 mg/dL (ref 0.3–1.2)
Total Protein: 6.1 g/dL — ABNORMAL LOW (ref 6.5–8.1)

## 2018-08-15 LAB — GLUCOSE, CAPILLARY
Glucose-Capillary: 122 mg/dL — ABNORMAL HIGH (ref 70–99)
Glucose-Capillary: 130 mg/dL — ABNORMAL HIGH (ref 70–99)
Glucose-Capillary: 114 mg/dL — ABNORMAL HIGH (ref 70–99)
Glucose-Capillary: 160 mg/dL — ABNORMAL HIGH (ref 70–99)

## 2018-08-15 LAB — AMMONIA: Ammonia: 30 umol/L (ref 9–35)

## 2018-08-15 LAB — PROTIME-INR
INR: 1.07
Prothrombin Time: 13.8 seconds (ref 11.4–15.2)

## 2018-08-15 MED ORDER — POTASSIUM CHLORIDE 10 MEQ/100ML IV SOLN
10.0000 meq | INTRAVENOUS | Status: AC
  Administered 2018-08-15: 10 meq via INTRAVENOUS
  Filled 2018-08-15: qty 100

## 2018-08-15 MED ORDER — TECHNETIUM TC 99M MEBROFENIN IV KIT
5.0000 | PACK | Freq: Once | INTRAVENOUS | Status: AC | PRN
  Administered 2018-08-15: 5.5 via INTRAVENOUS

## 2018-08-15 NOTE — Progress Notes (Signed)
Pharmacy Antibiotic Note  Today is day #4 of Unasyn therapy for this 73 yo male  admitted on 08/12/2018 with aspiration pneumonia.   Plan: Continue Unasyn 3gm IV q8h F/U cxs and clinical progress Monitor V/S, lab  Height: 5\' 10"  (177.8 cm) Weight: 178 lb 9.2 oz (81 kg) IBW/kg (Calculated) : 73  Temp (24hrs), Avg:100.4 F (38 C), Min:98.6 F (37 C), Max:101.8 F (38.8 C)  Recent Labs  Lab 08/12/18 0928 08/13/18 0546 08/14/18 0535 08/15/18 0659  WBC 14.2* 12.0*  --  8.5  CREATININE 0.97 0.93 0.77 0.63    Estimated Creatinine Clearance: 88.7 mL/min (by C-G formula based on SCr of 0.63 mg/dL).    Allergies  Allergen Reactions  . Donepezil Nausea And Vomiting  . Namenda [Memantine Hcl] Other (See Comments)    Sleeps all the time    Antimicrobials this admission: Unasyn 2/10 >>    Microbiology results: 2/10 BC x2: NG x3 days 2/10 UCx: NG 2/10 Influenza: +  Thank you for allowing pharmacy to be a part of this patient's care.  Tama Highamara Anthem Frazer, Pharm. D. Clinical Pharmacist 08/15/2018 1:28 PM

## 2018-08-15 NOTE — Progress Notes (Signed)
PROGRESS NOTE    Jon [name]  GLO:[id number] DOB: 07-18-1945 DOA: 08/12/2018 PCP: Mechele ClaudeStacks, Warren, MD   Brief Narrative:  73 y.o.malewith a past medical history significant for abdominal aortic aneurysm, dementia with behavioral disturbances, type 2 diabetes mellitus with hyperglycemia, hypertensionandhyperlipidemia;who presented to the hospital secondary to intractable nausea/vomiting, fever, inability to keep anything down and complaining of abdominal pain. Apparently symptoms has been present for the last week and a half and worsening. Food makes his symptoms worse and at this moment he is having trouble even keeping his medications down. Patient ended experiencing a fall at home prior to coming to the hospital due to generalized weakness associated with ongoing symptoms and dehydration. Per reports by family at bedside they have visited a different emergency department where work-up demonstrated concern for biliary abnormalities as probably 1 of the main reasons for his symptoms. At that time patient declines any further work-up and ended going home. Symptom has continue progressing and given inability to keep anything down they came once again to the hospital for further evaluation and management.  In ED patient was found to have a fever of 102, elevated WBCs, signs of dehydration with hemoconcentrated samples and elevated hemoglobin level. Chest x-ray demonstrated bibasilar infiltrates and bronchitic changes. Patient actively throwing up in the ED. He had elevated LFTs and a bilirubin of 1.5. CT abdomen demonstrated cholelithiasis right upper quadrant ultrasound with cholelithiasis and gallbladder sludge. Patient was also found to be positive for influenza A. Antiemetics and fluid resuscitation given in the ED. he continues to have LFTs that are trending up at this time and HIDA scan is ordered and pending.  Assessment & Plan:   Principal Problem:   Intractable nausea and  vomiting Active Problems:   Uncontrolled type 2 diabetes mellitus with complication, with long-term current use of insulin (HCC)   HTN (hypertension)   AAA (abdominal aortic aneurysm) without rupture (HCC)   Hyperlipidemia   Lewy body dementia with behavioral disturbance (HCC)   Elevated LFTs   Calculus of gallbladder without cholecystitis without obstruction  1-intractable nausea and vomiting-with transaminitis: -Multifactorial: With concern for biliary colic/cholelithiasis versus influenzaAinfection and aspiration PNA. -Continue to monitor LFTs and bilirubin that are up trending. -CT abdomen demonstrated a mobile gallstone 1.5 cm and also a sludge in his gallbladder ultrasound. -General surgery consultation appreciated. -Continue n.p.o. until HIDA scan and then full liquid diet, continue IV fluids, continue as needed antiemetics and analgesics. -Continue Tamiflu as part of the treatment for influenza and will continue the use of Unasyn to cover biliary infection along with aspiration pneumonia. -followclinical response.  2-influenza A -Continue Tamiflu -Continue supportive care and fluid resuscitation.  3-pneumonia: Appreciated bilateral infiltrates on x-ray,elevated WBCs, fever and productive cough. -Given history of ongoing intractable vomiting high concern for aspiration -Continue oxygen supplementation -Continue as needed DuoNeb -Continue antibiotic treatment with Unasyn  4-uncontrolled type 2 diabetes mellitus -Continue holding hypoglycemic agents while inpatient -Continue sliding scale insulin. -Check CBGsQAC/nightly.  5-hypertension -Continue holding oral antihypertensive medication regimen-continue as needed hydralazine. -Blood pressure stable.  6-AAA without rupture -Transverse diameter 3.3 cm -Recommend follow-up by ultrasound in 3 years.  7-hyperlipidemia/elevated LFTs -Will continue holding statin for now -LFTs slightly trending down -Continue  fluid resuscitation. -Follow recommendation by general surgery.  8-dementia with behavioral disturbances -Continue supportive care -Continue the use of Celexa and Exelon.  9-mild hypokalemia -Replete IV and recheck in a.m. along with magnesium   DVT prophylaxis: Heparin Code Status: Full code Family Communication:  Wife and  daughter at bedside Disposition Plan: Continue IV fluid as well as IV antibiotics with supportive care and diet advancement as tolerated.  Appreciate further general surgery evaluation with plans for HIDA scan this morning that is pending.   Consultants:   General surgery  Procedures:   See below for x-ray reports  Antimicrobials:   Tamiflu 2/10->  Unasyn 2/10->  Subjective: Patient seen and evaluated today with no new acute complaints or concerns. No acute concerns or events noted overnight.  He is currently awaiting his HIDA scan this morning.  He continues to have some low-grade temperatures this morning.  Objective: Vitals:   08/14/18 1328 08/14/18 2031 08/14/18 2106 08/15/18 0500  BP: 112/70  (!) 149/73 136/69  Pulse: 75  95 84  Resp: 18  20 20   Temp: 98.6 F (37 C)  (!) 101.8 F (38.8 C) (!) 100.9 F (38.3 C)  TempSrc: Oral  Oral Oral  SpO2: 94% 95% 91%   Weight:      Height:        Intake/Output Summary (Last 24 hours) at 08/15/2018 1103 Last data filed at 08/15/2018 0500 Gross per 24 hour  Intake 2027.11 ml  Output -  Net 2027.11 ml   Filed Weights   08/12/18 0853  Weight: 81 kg    Examination:  General exam: Appears calm and comfortable  Respiratory system: Clear to auscultation. Respiratory effort normal.  On 2 L nasal cannula Cardiovascular system: S1 & S2 heard, RRR. No JVD, murmurs, rubs, gallops or clicks. No pedal edema. Gastrointestinal system: Abdomen is nondistended, soft and nontender. No organomegaly or masses felt. Normal bowel sounds heard. Central nervous system: Alert and oriented. No focal  neurological deficits. Extremities: Symmetric 5 x 5 power. Skin: No rashes, lesions or ulcers Psychiatry: Cannot be appropriately assessed.    Data Reviewed: I have personally reviewed following labs and imaging studies  CBC: Recent Labs  Lab 08/12/18 0928 08/13/18 0546 08/15/18 0659  WBC 14.2* 12.0* 8.5  NEUTROABS 11.3*  --   --   HGB 17.1* 14.2 13.5  HCT 50.8 43.2 40.5  MCV 91.2 91.9 91.8  PLT 168 140* 143*   Basic Metabolic Panel: Recent Labs  Lab 08/12/18 0928 08/13/18 0546 08/14/18 0535 08/15/18 0659  NA 133* 135 135 134*  K 4.0 4.1 3.9 3.3*  CL 97* 104 104 104  CO2 24 24 23 22   GLUCOSE 178* 138* 147* 118*  BUN 14 15 11 10   CREATININE 0.97 0.93 0.77 0.63  CALCIUM 9.1 8.4* 8.0* 7.9*  MG  --  1.9  --   --   PHOS  --  2.9  --   --    GFR: Estimated Creatinine Clearance: 88.7 mL/min (by C-G formula based on SCr of 0.63 mg/dL). Liver Function Tests: Recent Labs  Lab 08/12/18 0928 08/13/18 0546 08/14/18 0535 08/15/18 0659  AST 55* 53* 72* 81*  ALT 92* 73* 88* 95*  ALKPHOS 207* 161* 190* 223*  BILITOT 1.5* 1.5* 1.5* 1.2  PROT 8.1 6.4* 6.0* 6.1*  ALBUMIN 3.9 3.0* 2.7* 2.7*   Recent Labs  Lab 08/12/18 0928  LIPASE 30   No results for input(s): AMMONIA in the last 168 hours. Coagulation Profile: No results for input(s): INR, PROTIME in the last 168 hours. Cardiac Enzymes: Recent Labs  Lab 08/12/18 0928  TROPONINI <0.03   BNP (last 3 results) No results for input(s): PROBNP in the last 8760 hours. HbA1C: No results for input(s): HGBA1C in the last 72 hours. CBG:  Recent Labs  Lab 08/14/18 1107 08/14/18 1603 08/14/18 2103 08/15/18 0339 08/15/18 0743  GLUCAP 242* 86 144* 122* 130*   Lipid Profile: No results for input(s): CHOL, HDL, LDLCALC, TRIG, CHOLHDL, LDLDIRECT in the last 72 hours. Thyroid Function Tests: No results for input(s): TSH, T4TOTAL, FREET4, T3FREE, THYROIDAB in the last 72 hours. Anemia Panel: No results for input(s):  VITAMINB12, FOLATE, FERRITIN, TIBC, IRON, RETICCTPCT in the last 72 hours. Sepsis Labs: No results for input(s): PROCALCITON, LATICACIDVEN in the last 168 hours.  Recent Results (from the past 240 hour(s))  Urine culture     Status: None   Collection Time: 08/12/18  9:08 AM  Result Value Ref Range Status   Specimen Description   Final    URINE, RANDOM Performed at [facility], [address], [place], [place] [address]    Special Requests   Final    NONE Performed at [facility], [address], [place], [place] [address]    Culture   Final    NO GROWTH Performed at [facility], [address], [place], [place] [address]    Report Status 08/13/2018 FINAL  Final  Group A Strep by PCR     Status: None   Collection Time: 08/12/18 11:41 AM  Result Value Ref Range Status   Group A Strep by PCR NOT DETECTED NOT DETECTED Final    Comment: Performed at [facility], [address] [place], [place] [address]  Culture, blood (routine x 2)     Status: None (Preliminary result)   Collection Time: 08/12/18  1:17 PM  Result Value Ref Range Status   Specimen Description BLOOD LEFT ANTECUBITAL  Final   Special Requests   Final    BOTTLES DRAWN AEROBIC AND ANAEROBIC Blood Culture adequate volume   Culture   Final    NO GROWTH 3 DAYS Performed at [facility], [address] [place], [place] [address]    Report Status PENDING  Incomplete  Culture, blood (routine x 2)     Status: None (Preliminary result)   Collection Time: 08/12/18  5:43 PM  Result Value Ref Range Status   Specimen Description BLOOD LEFT ANTECUBITAL  Final   Special Requests   Final    BOTTLES DRAWN AEROBIC AND ANAEROBIC Blood Culture adequate volume   Culture   Final    NO GROWTH 3 DAYS Performed at [facility], [address] [place], [place] [address]    Report Status PENDING  Incomplete         Radiology Studies: No results found.      Scheduled Meds: . citalopram  10 mg Oral Daily  . heparin   5,000 Units Subcutaneous Q8H  . insulin aspart  0-5 Units Subcutaneous QHS  . insulin aspart  0-9 Units Subcutaneous TID WC  . oseltamivir  75 mg Oral BID  . pantoprazole  40 mg Oral BID  . rivastigmine  4.5 mg Oral BID   Continuous Infusions: . sodium chloride 100 mL/hr at 08/15/18 0300  . ampicillin-sulbactam (UNASYN) IV 3 g (08/15/18 0407)  . potassium chloride       LOS: 3 days    Time spent: 30 minutes    Crescencio Jozwiak Hoover Brunette, DO [facility] Pager [phone]  If 7PM-7AM, please contact night-coverage [email or web address] Password [facility] 08/15/2018, 11:03 AM

## 2018-08-15 NOTE — Progress Notes (Addendum)
[facility]  HIDA with filling of the gallbladder. No acute cholecystitis. The symptoms are likely related to his other illness. Will see as outpatient to discuss need for any cholecystectomy in the future.  Treat other pathologies. Unsure why Alk phos, AST/ ALT trending up, but T Bili is going down. Would trend at this time. HIDA does mention some heptocellular dysfunction suspected with the clearing of the contrast. May need to get GI involved to follow for liver.   Continue to advance diet as tolerated.   Curlene Labrum, MD [facility] [address] [place], [place] [address] [phone] (office)

## 2018-08-16 ENCOUNTER — Other Ambulatory Visit: Payer: Self-pay | Admitting: Family Medicine

## 2018-08-16 LAB — COMPREHENSIVE METABOLIC PANEL
ALT: 86 U/L — ABNORMAL HIGH (ref 0–44)
AST: 70 U/L — ABNORMAL HIGH (ref 15–41)
Albumin: 2.7 g/dL — ABNORMAL LOW (ref 3.5–5.0)
Alkaline Phosphatase: 257 U/L — ABNORMAL HIGH (ref 38–126)
Anion gap: 9 (ref 5–15)
BUN: 9 mg/dL (ref 8–23)
CO2: 21 mmol/L — ABNORMAL LOW (ref 22–32)
Calcium: 8.1 mg/dL — ABNORMAL LOW (ref 8.9–10.3)
Chloride: 103 mmol/L (ref 98–111)
Creatinine, Ser: 0.69 mg/dL (ref 0.61–1.24)
GFR calc Af Amer: >60 mL/min (ref >60)
GFR calc non Af Amer: >60 mL/min (ref >60)
Glucose, Bld: 138 mg/dL — ABNORMAL HIGH (ref 70–99)
Potassium: 3.3 mmol/L — ABNORMAL LOW (ref 3.5–5.1)
Sodium: 133 mmol/L — ABNORMAL LOW (ref 135–145)
Total Bilirubin: 1.4 mg/dL — ABNORMAL HIGH (ref 0.3–1.2)
Total Protein: 6.4 g/dL — ABNORMAL LOW (ref 6.5–8.1)

## 2018-08-16 LAB — GLUCOSE, CAPILLARY
Glucose-Capillary: 167 mg/dL — ABNORMAL HIGH (ref 70–99)
Glucose-Capillary: 114 mg/dL — ABNORMAL HIGH (ref 70–99)
Glucose-Capillary: 192 mg/dL — ABNORMAL HIGH (ref 70–99)
Glucose-Capillary: 147 mg/dL — ABNORMAL HIGH (ref 70–99)
Glucose-Capillary: 205 mg/dL — ABNORMAL HIGH (ref 70–99)

## 2018-08-16 LAB — CBC
HCT: 40 % (ref 39.0–52.0)
Hemoglobin: 13.2 g/dL (ref 13.0–17.0)
MCH: 29.6 pg (ref 26.0–34.0)
MCHC: 33 g/dL (ref 30.0–36.0)
MCV: 89.7 fL (ref 80.0–100.0)
Platelets: 169 10*3/uL (ref 150–400)
RBC: 4.46 MIL/uL (ref 4.22–5.81)
RDW: 13 % (ref 11.5–15.5)
WBC: 7.2 10*3/uL (ref 4.0–10.5)
nRBC: 0 % (ref 0.0–0.2)

## 2018-08-16 LAB — HEPATITIS PANEL, ACUTE
HCV Ab: <0.1 s/co ratio (ref 0.0–0.9)
Hep A IgM: NEGATIVE
Hep B C IgM: NEGATIVE
Hepatitis B Surface Ag: NEGATIVE

## 2018-08-16 LAB — GAMMA GT: GGT: 395 U/L — ABNORMAL HIGH (ref 7–50)

## 2018-08-16 LAB — MAGNESIUM: Magnesium: 2.1 mg/dL (ref 1.7–2.4)

## 2018-08-16 MED ORDER — IPRATROPIUM-ALBUTEROL 0.5-2.5 (3) MG/3ML IN SOLN
3.0000 mL | Freq: Four times a day (QID) | RESPIRATORY_TRACT | Status: DC | PRN
  Administered 2018-08-16: 3 mL via RESPIRATORY_TRACT
  Filled 2018-08-16: qty 3

## 2018-08-16 MED ORDER — POTASSIUM CHLORIDE CRYS ER 20 MEQ PO TBCR
40.0000 meq | EXTENDED_RELEASE_TABLET | Freq: Once | ORAL | Status: AC
  Administered 2018-08-16: 40 meq via ORAL
  Filled 2018-08-16: qty 2

## 2018-08-16 MED ORDER — IPRATROPIUM-ALBUTEROL 0.5-2.5 (3) MG/3ML IN SOLN
3.0000 mL | Freq: Three times a day (TID) | RESPIRATORY_TRACT | Status: DC
  Administered 2018-08-16 – 2018-08-21 (×16): 3 mL via RESPIRATORY_TRACT
  Filled 2018-08-16 (×17): qty 3

## 2018-08-16 NOTE — Progress Notes (Signed)
PROGRESS NOTE    Jon [name]  ZOX:[id number] DOB: 24-Apr-1946 DOA: 08/12/2018 PCP: Mechele ClaudeStacks, [place], MD   Brief Narrative:  73 y.o.malewith a past medical history significant for abdominal aortic aneurysm, dementia with behavioral disturbances, type 2 diabetes mellitus with hyperglycemia, hypertensionandhyperlipidemia;who presented to the hospital secondary to intractable nausea/vomiting, fever, inability to keep anything down and complaining of abdominal pain. Apparently symptoms has been present for the last week and a half and worsening. Food makes his symptoms worse and at this moment he is having trouble even keeping his medications down. Patient ended experiencing a fall at home prior to coming to the hospital due to generalized weakness associated with ongoing symptoms and dehydration. Per reports by family at bedside they have visited a different emergency department where work-up demonstrated concern for biliary abnormalities as probably 1 of the main reasons for his symptoms. At that time patient declines any further work-up and ended going home. Symptom has continue progressing and given inability to keep anything down they came once again to the hospital for further evaluation and management.  In ED patient was found to have a fever of 102, elevated WBCs, signs of dehydration with hemoconcentrated samples and elevated hemoglobin level. Chest x-ray demonstrated bibasilar infiltrates and bronchitic changes. Patient actively throwing up in the ED. He had elevated LFTs and a bilirubin of 1.5. CT abdomen demonstrated cholelithiasis right upper quadrant ultrasound with cholelithiasis and gallbladder sludge. Patient was also found to be positive for influenza A. Antiemetics and fluid resuscitation given in the ED.he continues to have LFTs that are trending up, with some stabilizing.  HIDA scan within normal limits.  Assessment & Plan:   Principal Problem:   Intractable nausea  and vomiting Active Problems:   Uncontrolled type 2 diabetes mellitus with complication, with long-term current use of insulin (HCC)   HTN (hypertension)   AAA (abdominal aortic aneurysm) without rupture (HCC)   Hyperlipidemia   Lewy body dementia with behavioral disturbance (HCC)   Elevated LFTs   Calculus of gallbladder without cholecystitis without obstruction   1-intractable nausea and vomiting-with transaminitis: -Multifactorial: With concern for biliary colic/cholelithiasis versus influenzaAinfection and aspiration PNA. -Continue to monitor LFTs and bilirubin that appear to be stabilizing.  Will order GGT per GI recommendations today. -CT abdomen demonstrated a mobile gallstone 1.5 cm and also a sludge in his gallbladder ultrasound. -General surgeryconsultation appreciated. -Advance diet to soft today. -Continue Tamiflu as part of the treatment for influenza and will continue the use of Unasyn to cover biliary infection along with aspiration pneumonia. -followclinical response.  2-influenza A -Continue Tamiflu -Continue supportive care and fluid resuscitation.  3-pneumonia: Appreciated bilateral infiltrates on x-ray,elevated WBCs, fever and productive cough. -Given history of ongoing intractable vomiting high concern for aspiration -Continue oxygen supplementation -Continue as needed DuoNeb -Continue antibiotic treatment with Unasyn  4-uncontrolled type 2 diabetes mellitus -Continue holding hypoglycemic agents while inpatient -Continue sliding scale insulin. -Check CBGsQAC/nightly.  5-hypertension -Continue holding oral antihypertensive medication regimen-continue as needed hydralazine. -Blood pressure stable.  6-AAA without rupture -Transverse diameter 3.3 cm -Recommend follow-up by ultrasound in 3 years.  7-hyperlipidemia/elevated LFTs -Will continue holding statin for now -LFTs slightly trending down -Continue fluid resuscitation. -Follow  recommendation by general surgery.  8-dementia with behavioral disturbances -Continue supportive care -Continue the use of Celexa and Exelon.  9-mild hypokalemia -Replete orally once again and recheck in a.m. -Magnesium within normal limits   DVT prophylaxis:Heparin Code Status:Full code Family Communication: Wife at bedside Disposition Plan:Continue IV fluid as well  as IV antibiotics with supportive care and advance diet to soft today.  PT evaluation ordered and pending.  Continue to wean oxygen.   Consultants:  General surgery  Procedures:  See below for x-ray reports  Antimicrobials:  Tamiflu 2/10->  Unasyn 2/10->  Subjective: Patient seen and evaluated today with no new acute complaints or concerns. No acute concerns or events noted overnight.  He overall appears to be feeling much better this morning with less nausea and vomiting.  Wife at bedside states that he is doing much better.  Objective: Vitals:   08/15/18 1433 08/16/18 0348 08/16/18 0455 08/16/18 0758  BP: 125/62 137/72    Pulse: 83 86    Resp: 19 (!) 24    Temp: 98.9 F (37.2 C) 97.8 F (36.6 C)    TempSrc: Oral Oral    SpO2: 92% 91% 91% (!) 87%  Weight:      Height:        Intake/Output Summary (Last 24 hours) at 08/16/2018 1141 Last data filed at 08/16/2018 0358 Gross per 24 hour  Intake 680 ml  Output 1400 ml  Net -720 ml   Filed Weights   08/12/18 0853  Weight: 81 kg    Examination:  General exam: Appears calm and comfortable  Respiratory system: Clear to auscultation. Respiratory effort normal.  Currently on oxygen via nasal cannula. Cardiovascular system: S1 & S2 heard, RRR. No JVD, murmurs, rubs, gallops or clicks. No pedal edema. Gastrointestinal system: Abdomen is nondistended, soft and nontender. No organomegaly or masses felt. Normal bowel sounds heard. Central nervous system: Alert and oriented. No focal neurological deficits. Extremities: Symmetric 5 x 5  power. Skin: No rashes, lesions or ulcers Psychiatry: Judgement and insight appear normal. Mood & affect appropriate.     Data Reviewed: I have personally reviewed following labs and imaging studies  CBC: Recent Labs  Lab 08/12/18 0928 08/13/18 0546 08/15/18 0659 08/16/18 0548  WBC 14.2* 12.0* 8.5 7.2  NEUTROABS 11.3*  --   --   --   HGB 17.1* 14.2 13.5 13.2  HCT 50.8 43.2 40.5 40.0  MCV 91.2 91.9 91.8 89.7  PLT 168 140* 143* 169   Basic Metabolic Panel: Recent Labs  Lab 08/12/18 0928 08/13/18 0546 08/14/18 0535 08/15/18 0659 08/16/18 0548  NA 133* 135 135 134* 133*  K 4.0 4.1 3.9 3.3* 3.3*  CL 97* 104 104 104 103  CO2 24 24 23 22  21*  GLUCOSE 178* 138* 147* 118* 138*  BUN 14 15 11 10 9   CREATININE 0.97 0.93 0.77 0.63 0.69  CALCIUM 9.1 8.4* 8.0* 7.9* 8.1*  MG  --  1.9  --   --  2.1  PHOS  --  2.9  --   --   --    GFR: Estimated Creatinine Clearance: 88.7 mL/min (by C-G formula based on SCr of 0.69 mg/dL). Liver Function Tests: Recent Labs  Lab 08/12/18 0928 08/13/18 0546 08/14/18 0535 08/15/18 0659 08/16/18 0548  AST 55* 53* 72* 81* 70*  ALT 92* 73* 88* 95* 86*  ALKPHOS 207* 161* 190* 223* 257*  BILITOT 1.5* 1.5* 1.5* 1.2 1.4*  PROT 8.1 6.4* 6.0* 6.1* 6.4*  ALBUMIN 3.9 3.0* 2.7* 2.7* 2.7*   Recent Labs  Lab 08/12/18 0928  LIPASE 30   Recent Labs  Lab 08/15/18 1511  AMMONIA 30   Coagulation Profile: Recent Labs  Lab 08/15/18 1511  INR 1.07   Cardiac Enzymes: Recent Labs  Lab 08/12/18 0928  TROPONINI <0.03  BNP (last 3 results) No results for input(s): PROBNP in the last 8760 hours. HbA1C: No results for input(s): HGBA1C in the last 72 hours. CBG: Recent Labs  Lab 08/15/18 0743 08/15/18 1213 08/15/18 1700 08/15/18 2102 08/16/18 0732  GLUCAP 130* 114* 167* 160* 114*   Lipid Profile: No results for input(s): CHOL, HDL, LDLCALC, TRIG, CHOLHDL, LDLDIRECT in the last 72 hours. Thyroid Function Tests: No results for input(s):  TSH, T4TOTAL, FREET4, T3FREE, THYROIDAB in the last 72 hours. Anemia Panel: No results for input(s): VITAMINB12, FOLATE, FERRITIN, TIBC, IRON, RETICCTPCT in the last 72 hours. Sepsis Labs: No results for input(s): PROCALCITON, LATICACIDVEN in the last 168 hours.  Recent Results (from the past 240 hour(s))  Urine culture     Status: None   Collection Time: 08/12/18  9:08 AM  Result Value Ref Range Status   Specimen Description   Final    URINE, RANDOM Performed at [facility], [address] [place], [place] [address]    Special Requests   Final    NONE Performed at [facility], [address], [place], [place] [address]    Culture   Final    NO GROWTH Performed at [facility], [address], [place], [place] [address]    Report Status 08/13/2018 FINAL  Final  Group A Strep by PCR     Status: None   Collection Time: 08/12/18 11:41 AM  Result Value Ref Range Status   Group A Strep by PCR NOT DETECTED NOT DETECTED Final    Comment: Performed at [facility], [address], [place], [place] [address]  Culture, blood (routine x 2)     Status: None (Preliminary result)   Collection Time: 08/12/18  1:17 PM  Result Value Ref Range Status   Specimen Description BLOOD LEFT ANTECUBITAL  Final   Special Requests   Final    BOTTLES DRAWN AEROBIC AND ANAEROBIC Blood Culture adequate volume   Culture   Final    NO GROWTH 4 DAYS Performed at [facility], [address], [place], [place] [address]    Report Status PENDING  Incomplete  Culture, blood (routine x 2)     Status: None (Preliminary result)   Collection Time: 08/12/18  5:43 PM  Result Value Ref Range Status   Specimen Description BLOOD LEFT ANTECUBITAL  Final   Special Requests   Final    BOTTLES DRAWN AEROBIC AND ANAEROBIC Blood Culture adequate volume   Culture   Final    NO GROWTH 4 DAYS Performed at [facility], [address], [place], [place] [address]    Report Status PENDING  Incomplete          Radiology Studies: Nm Hepatobiliary Liver Func  Result Date: 08/15/2018 CLINICAL DATA:  Abdominal pain with nausea and vomiting question acute cholecystitis EXAM: NUCLEAR MEDICINE HEPATOBILIARY IMAGING TECHNIQUE: Sequential images of the abdomen were obtained out to 60 minutes following intravenous administration of radiopharmaceutical. RADIOPHARMACEUTICALS:  5.5 mCi Tc-37m  Choletec IV COMPARISON:  CT abdomen and pelvis 08/12/2018, ultrasound abdomen 08/12/2018 FINDINGS: Adequate clearance of tracer from bloodstream. Excretion of tracer into biliary tree with visualization of small bowel by 27 minutes period gallbladder visualized at 42 minutes period at 1 hour, moderate amount of retained tracer is seen within the liver at suggesting a component of hepatocellular dysfunction. IMPRESSION: Patent cystic duct. Patent CBD. Mild hepatocellular dysfunction. Electronically Signed   By: Ulyses Southward M.D.   On: 08/15/2018 12:08  Scheduled Meds: . citalopram  10 mg Oral Daily  . heparin  5,000 Units Subcutaneous Q8H  . insulin aspart  0-5 Units Subcutaneous QHS  . insulin aspart  0-9 Units Subcutaneous TID WC  . ipratropium-albuterol  3 mL Nebulization TID  . oseltamivir  75 mg Oral BID  . pantoprazole  40 mg Oral BID  . potassium chloride  40 mEq Oral Once  . rivastigmine  4.5 mg Oral BID   Continuous Infusions: . sodium chloride 100 mL/hr at 08/15/18 2157  . ampicillin-sulbactam (UNASYN) IV 3 g (08/16/18 0544)     LOS: 4 days    Time spent: 30 minutes    Kenly Henckel Hoover Brunette, DO [facility] Pager [phone]  If 7PM-7AM, please contact night-coverage [email or web address] Password [facility] 08/16/2018, 11:41 AM

## 2018-08-16 NOTE — Progress Notes (Signed)
RN paged Dr. Antionette Char to make him aware patient has 02 sat no greater than 91% on 4 L 02/[place], productive cough noted, patient swallowing mucous).  RN requested order for breathing treatments if appropriate, awaiting response.  P.J. Henderson Newcomer, RN

## 2018-08-16 NOTE — Progress Notes (Signed)
[facility]  Patient had some nausea today without fluids per the wife. HIDA was negative and reviewed this with family. Recommend outpatient follow up for discussion regarding the gallbladder.    Algis Greenhouse, MD [facility] [address] [place], [place] [address] [phone] (office)

## 2018-08-17 ENCOUNTER — Encounter (HOSPITAL_COMMUNITY): Payer: Self-pay

## 2018-08-17 LAB — COMPREHENSIVE METABOLIC PANEL
ALT: 93 U/L — ABNORMAL HIGH (ref 0–44)
ANION GAP: 8 (ref 5–15)
AST: 89 U/L — ABNORMAL HIGH (ref 15–41)
Albumin: 2.5 g/dL — ABNORMAL LOW (ref 3.5–5.0)
Alkaline Phosphatase: 263 U/L — ABNORMAL HIGH (ref 38–126)
BUN: 9 mg/dL (ref 8–23)
CALCIUM: 7.8 mg/dL — AB (ref 8.9–10.3)
CO2: 22 mmol/L (ref 22–32)
Chloride: 106 mmol/L (ref 98–111)
Creatinine, Ser: 0.64 mg/dL (ref 0.61–1.24)
GFR calc Af Amer: >60 mL/min (ref >60)
GFR calc non Af Amer: >60 mL/min (ref >60)
Glucose, Bld: 133 mg/dL — ABNORMAL HIGH (ref 70–99)
Potassium: 2.9 mmol/L — ABNORMAL LOW (ref 3.5–5.1)
Sodium: 136 mmol/L (ref 135–145)
Total Bilirubin: 1.1 mg/dL (ref 0.3–1.2)
Total Protein: 6 g/dL — ABNORMAL LOW (ref 6.5–8.1)

## 2018-08-17 LAB — URINALYSIS, ROUTINE W REFLEX MICROSCOPIC
Bilirubin Urine: NEGATIVE
Glucose, UA: >=500 mg/dL — AB
Hgb urine dipstick: NEGATIVE
Ketones, ur: NEGATIVE mg/dL
Leukocytes,Ua: NEGATIVE
NITRITE: NEGATIVE
Protein, ur: NEGATIVE mg/dL
Specific Gravity, Urine: 1.015 (ref 1.005–1.030)
pH: 7 (ref 5.0–8.0)

## 2018-08-17 LAB — GLUCOSE, CAPILLARY
GLUCOSE-CAPILLARY: 146 mg/dL — AB (ref 70–99)
Glucose-Capillary: 217 mg/dL — ABNORMAL HIGH (ref 70–99)
Glucose-Capillary: 218 mg/dL — ABNORMAL HIGH (ref 70–99)
Glucose-Capillary: 185 mg/dL — ABNORMAL HIGH (ref 70–99)

## 2018-08-17 LAB — CULTURE, BLOOD (ROUTINE X 2)
Culture: NO GROWTH
Culture: NO GROWTH
Special Requests: ADEQUATE
Special Requests: ADEQUATE

## 2018-08-17 MED ORDER — POTASSIUM CHLORIDE CRYS ER 20 MEQ PO TBCR
40.0000 meq | EXTENDED_RELEASE_TABLET | Freq: Two times a day (BID) | ORAL | Status: DC
  Administered 2018-08-17 – 2018-08-21 (×9): 40 meq via ORAL
  Filled 2018-08-17 (×9): qty 2

## 2018-08-17 MED ORDER — POTASSIUM CHLORIDE 10 MEQ/100ML IV SOLN
10.0000 meq | INTRAVENOUS | Status: AC
  Administered 2018-08-17 (×4): 10 meq via INTRAVENOUS
  Filled 2018-08-17 (×4): qty 100

## 2018-08-17 NOTE — Progress Notes (Signed)
PROGRESS NOTE    Jon [name]  QMV:[id number] DOB: December 30, 1945 DOA: 08/12/2018 PCP: Mechele ClaudeStacks, [place], MD   Brief Narrative:  73 y.o.malewith a past medical history significant for abdominal aortic aneurysm, dementia with behavioral disturbances, type 2 diabetes mellitus with hyperglycemia, hypertensionandhyperlipidemia;who presented to the hospital secondary to intractable nausea/vomiting, fever, inability to keep anything down and complaining of abdominal pain. Apparently symptoms has been present for the last week and a half and worsening. Food makes his symptoms worse and at this moment he is having trouble even keeping his medications down. Patient ended experiencing a fall at home prior to coming to the hospital due to generalized weakness associated with ongoing symptoms and dehydration. Per reports by family at bedside they have visited a different emergency department where work-up demonstrated concern for biliary abnormalities as probably 1 of the main reasons for his symptoms. At that time patient declines any further work-up and ended going home. Symptom has continue progressing and given inability to keep anything down they came once again to the hospital for further evaluation and management.  In ED patient was found to have a fever of 102, elevated WBCs, signs of dehydration with hemoconcentrated samples and elevated hemoglobin level. Chest x-ray demonstrated bibasilar infiltrates and bronchitic changes. Patient actively throwing up in the ED. He had elevated LFTs and a bilirubin of 1.5. CT abdomen demonstrated cholelithiasis right upper quadrant ultrasound with cholelithiasis and gallbladder sludge. Patient was also found to be positive for influenza A. Antiemetics and fluid resuscitation given in the ED.he continues to have LFTs that are trending up, with some stabilizing.  HIDA scan within normal limits.   Assessment & Plan:   Principal Problem:   Intractable nausea  and vomiting Active Problems:   Uncontrolled type 2 diabetes mellitus with complication, with long-term current use of insulin (HCC)   HTN (hypertension)   AAA (abdominal aortic aneurysm) without rupture (HCC)   Hyperlipidemia   Lewy body dementia with behavioral disturbance (HCC)   Elevated LFTs   Calculus of gallbladder without cholecystitis without obstruction  1-intractable nausea and vomiting-with transaminitis: -Multifactorial: With concern for biliary colic/cholelithiasis versus influenzaAinfection and aspiration PNA. -Continue to monitor LFTs and bilirubin that appear to be stabilizing.    GGT is elevated.  If LFTs continue to elevate and patient is clinically in poor shape, will consider GI consultation by 2/17. -CT abdomen demonstrated a mobile gallstone 1.5 cm and also a sludge in his gallbladder ultrasound. -Advance diet to heart healthy/carb modified today -Tamiflu treatment completed and will maintain on Unasyn -followclinical response.  2-influenza A -Tamiflu treatment completed. -Continue supportive care and fluid resuscitation.  3-pneumonia: Appreciated bilateral infiltrates on x-ray,elevated WBCs, fever and productive cough. -Given history of ongoing intractable vomiting high concern for aspiration -Continue oxygen supplementation -Continue as needed DuoNeb -Continue antibiotic treatment with Unasyn  4-uncontrolled type 2 diabetes mellitus -Continue holding hypoglycemic agents while inpatient -Continue sliding scale insulin. -Check CBGsQAC/nightly.  5-hypertension -Continue holding oral antihypertensive medication regimen-continue as needed hydralazine. -Blood pressure stable.  6-AAA without rupture -Transverse diameter 3.3 cm -Recommend follow-up by ultrasound in 3 years.  7-hyperlipidemia/elevated LFTs with elevated GGT -Will continue holding statin for now -LFTs stable -Continue fluid resuscitation. -Follow recommendation by general  surgery.  8-dementia with behavioral disturbances -Continue supportive care -Continue the use of Celexa and Exelon.  9-hypokalemia -Replete orally and IV and recheck in a.m. -Magnesium within normal limits  10-mild dysuria -We will check UA today -Maintain on condom catheter  DVT prophylaxis:Heparin Code Status:Full  code Family Communication:Wife and daughter at bedside Disposition Plan:Continue IV fluid as well as IV antibiotics with supportive care and advance diet to carb modified today.  PT evaluation ordered and pending.  Continue to wean oxygen.   Consultants:  General surgery  Procedures:  See below for x-ray reports  Antimicrobials:  Tamiflu 2/10->2/15  Unasyn 2/10->  Subjective: Patient seen and evaluated today with no new acute complaints or concerns. No acute concerns or events noted overnight.  There was some initial concern about trouble urinating as expressed by the wife, but patient appears to deny this and is unsure.  Objective: Vitals:   08/16/18 2256 08/17/18 0556 08/17/18 0759 08/17/18 0929  BP: 120/60 122/64  131/65  Pulse: 75 66  80  Resp: 20 (!) 24  20  Temp: 98.5 F (36.9 C) 98.4 F (36.9 C)  97.7 F (36.5 C)  TempSrc: Oral Oral  Oral  SpO2: 90% 91% 90% (!) 89%  Weight:      Height:        Intake/Output Summary (Last 24 hours) at 08/17/2018 1103 Last data filed at 08/17/2018 0900 Gross per 24 hour  Intake 560 ml  Output 2550 ml  Net -1990 ml   Filed Weights   08/12/18 0853  Weight: 81 kg    Examination:  General exam: Appears calm and comfortable  Respiratory system: Clear to auscultation. Respiratory effort normal.  Currently on 5 L nasal cannula. Cardiovascular system: S1 & S2 heard, RRR. No JVD, murmurs, rubs, gallops or clicks. No pedal edema. Gastrointestinal system: Abdomen is nondistended, soft and nontender. No organomegaly or masses felt. Normal bowel sounds heard. Central nervous system: Alert and  oriented. No focal neurological deficits. Extremities: Symmetric 5 x 5 power. Skin: No rashes, lesions or ulcers Psychiatry: Cannot be evaluated. Foley with clear yellow urine output noted.    Data Reviewed: I have personally reviewed following labs and imaging studies  CBC: Recent Labs  Lab 08/12/18 0928 08/13/18 0546 08/15/18 0659 08/16/18 0548  WBC 14.2* 12.0* 8.5 7.2  NEUTROABS 11.3*  --   --   --   HGB 17.1* 14.2 13.5 13.2  HCT 50.8 43.2 40.5 40.0  MCV 91.2 91.9 91.8 89.7  PLT 168 140* 143* 169   Basic Metabolic Panel: Recent Labs  Lab 08/13/18 0546 08/14/18 0535 08/15/18 0659 08/16/18 0548 08/17/18 0638  NA 135 135 134* 133* 136  K 4.1 3.9 3.3* 3.3* 2.9*  CL 104 104 104 103 106  CO2 24 23 22  21* 22  GLUCOSE 138* 147* 118* 138* 133*  BUN 15 11 10 9 9   CREATININE 0.93 0.77 0.63 0.69 0.64  CALCIUM 8.4* 8.0* 7.9* 8.1* 7.8*  MG 1.9  --   --  2.1  --   PHOS 2.9  --   --   --   --    GFR: Estimated Creatinine Clearance: 88.7 mL/min (by C-G formula based on SCr of 0.64 mg/dL). Liver Function Tests: Recent Labs  Lab 08/13/18 0546 08/14/18 0535 08/15/18 0659 08/16/18 0548 08/17/18 0638  AST 53* 72* 81* 70* 89*  ALT 73* 88* 95* 86* 93*  ALKPHOS 161* 190* 223* 257* 263*  BILITOT 1.5* 1.5* 1.2 1.4* 1.1  PROT 6.4* 6.0* 6.1* 6.4* 6.0*  ALBUMIN 3.0* 2.7* 2.7* 2.7* 2.5*   Recent Labs  Lab 08/12/18 0928  LIPASE 30   Recent Labs  Lab 08/15/18 1511  AMMONIA 30   Coagulation Profile: Recent Labs  Lab 08/15/18 1511  INR 1.07  Cardiac Enzymes: Recent Labs  Lab 08/12/18 0928  TROPONINI <0.03   BNP (last 3 results) No results for input(s): PROBNP in the last 8760 hours. HbA1C: No results for input(s): HGBA1C in the last 72 hours. CBG: Recent Labs  Lab 08/16/18 0732 08/16/18 1200 08/16/18 1638 08/16/18 2254 08/17/18 0924  GLUCAP 114* 192* 147* 205* 217*   Lipid Profile: No results for input(s): CHOL, HDL, LDLCALC, TRIG, CHOLHDL, LDLDIRECT  in the last 72 hours. Thyroid Function Tests: No results for input(s): TSH, T4TOTAL, FREET4, T3FREE, THYROIDAB in the last 72 hours. Anemia Panel: No results for input(s): VITAMINB12, FOLATE, FERRITIN, TIBC, IRON, RETICCTPCT in the last 72 hours. Sepsis Labs: No results for input(s): PROCALCITON, LATICACIDVEN in the last 168 hours.  Recent Results (from the past 240 hour(s))  Urine culture     Status: None   Collection Time: 08/12/18  9:08 AM  Result Value Ref Range Status   Specimen Description   Final    URINE, RANDOM Performed at [facility], [address] [place], [place] [address]    Special Requests   Final    NONE Performed at [facility], [address], [place], [place] [address]    Culture   Final    NO GROWTH Performed at [facility], [address] [place], [place] [address]    Report Status 08/13/2018 FINAL  Final  Group A Strep by PCR     Status: None   Collection Time: 08/12/18 11:41 AM  Result Value Ref Range Status   Group A Strep by PCR NOT DETECTED NOT DETECTED Final    Comment: Performed at [facility], [address] [place], [place] [address]  Culture, blood (routine x 2)     Status: None   Collection Time: 08/12/18  1:17 PM  Result Value Ref Range Status   Specimen Description BLOOD LEFT ANTECUBITAL  Final   Special Requests   Final    BOTTLES DRAWN AEROBIC AND ANAEROBIC Blood Culture adequate volume   Culture   Final    NO GROWTH 5 DAYS Performed at [facility], [address] [place], [place] [address]    Report Status 08/17/2018 FINAL  Final  Culture, blood (routine x 2)     Status: None   Collection Time: 08/12/18  5:43 PM  Result Value Ref Range Status   Specimen Description BLOOD LEFT ANTECUBITAL  Final   Special Requests   Final    BOTTLES DRAWN AEROBIC AND ANAEROBIC Blood Culture adequate volume   Culture   Final    NO GROWTH 5 DAYS Performed at [facility], [address], [place], [place] [address]    Report Status  08/17/2018 FINAL  Final         Radiology Studies: Nm Hepatobiliary Liver Func  Result Date: 08/15/2018 CLINICAL DATA:  Abdominal pain with nausea and vomiting question acute cholecystitis EXAM: NUCLEAR MEDICINE HEPATOBILIARY IMAGING TECHNIQUE: Sequential images of the abdomen were obtained out to 60 minutes following intravenous administration of radiopharmaceutical. RADIOPHARMACEUTICALS:  5.5 mCi Tc-50m  Choletec IV COMPARISON:  CT abdomen and pelvis 08/12/2018, ultrasound abdomen 08/12/2018 FINDINGS: Adequate clearance of tracer from bloodstream. Excretion of tracer into biliary tree with visualization of small bowel by 27 minutes period gallbladder visualized at 42 minutes period at 1 hour, moderate amount of retained tracer is seen within the liver at suggesting a component of hepatocellular dysfunction. IMPRESSION: Patent cystic duct. Patent CBD. Mild hepatocellular dysfunction. Electronically Signed   By: Ulyses Southward  M.D.   On: 08/15/2018 12:08        Scheduled Meds: . citalopram  10 mg Oral Daily  . heparin  5,000 Units Subcutaneous Q8H  . insulin aspart  0-5 Units Subcutaneous QHS  . insulin aspart  0-9 Units Subcutaneous TID WC  . ipratropium-albuterol  3 mL Nebulization TID  . pantoprazole  40 mg Oral BID  . potassium chloride  40 mEq Oral BID  . rivastigmine  4.5 mg Oral BID   Continuous Infusions: . sodium chloride 100 mL/hr at 08/17/18 0522  . ampicillin-sulbactam (UNASYN) IV 3 g (08/17/18 0524)  . potassium chloride       LOS: 5 days    Time spent: 30 minutes    Jon Gottlieb Hoover BrunetteD Laquinn Shippy, DO [facility] Pager [phone]  If 7PM-7AM, please contact night-coverage [email or web address] Password [email or web address] 08/17/2018, 11:03 AM

## 2018-08-18 LAB — CBC
HCT: 37.3 % — ABNORMAL LOW (ref 39.0–52.0)
Hemoglobin: 12.3 g/dL — ABNORMAL LOW (ref 13.0–17.0)
MCH: 29.8 pg (ref 26.0–34.0)
MCHC: 33 g/dL (ref 30.0–36.0)
MCV: 90.3 fL (ref 80.0–100.0)
Platelets: 198 10*3/uL (ref 150–400)
RBC: 4.13 MIL/uL — AB (ref 4.22–5.81)
RDW: 13.4 % (ref 11.5–15.5)
WBC: 9.1 10*3/uL (ref 4.0–10.5)
nRBC: 0 % (ref 0.0–0.2)

## 2018-08-18 LAB — COMPREHENSIVE METABOLIC PANEL
ALBUMIN: 2.5 g/dL — AB (ref 3.5–5.0)
ALK PHOS: 270 U/L — AB (ref 38–126)
ALT: 99 U/L — ABNORMAL HIGH (ref 0–44)
AST: 87 U/L — ABNORMAL HIGH (ref 15–41)
Anion gap: 7 (ref 5–15)
BUN: 7 mg/dL — ABNORMAL LOW (ref 8–23)
CO2: 19 mmol/L — ABNORMAL LOW (ref 22–32)
Calcium: 7.9 mg/dL — ABNORMAL LOW (ref 8.9–10.3)
Chloride: 107 mmol/L (ref 98–111)
Creatinine, Ser: 0.63 mg/dL (ref 0.61–1.24)
GFR calc Af Amer: >60 mL/min (ref >60)
GFR calc non Af Amer: >60 mL/min (ref >60)
GLUCOSE: 137 mg/dL — AB (ref 70–99)
Potassium: 3.6 mmol/L (ref 3.5–5.1)
Sodium: 133 mmol/L — ABNORMAL LOW (ref 135–145)
Total Bilirubin: 1 mg/dL (ref 0.3–1.2)
Total Protein: 6.3 g/dL — ABNORMAL LOW (ref 6.5–8.1)

## 2018-08-18 LAB — GLUCOSE, CAPILLARY
Glucose-Capillary: 118 mg/dL — ABNORMAL HIGH (ref 70–99)
Glucose-Capillary: 162 mg/dL — ABNORMAL HIGH (ref 70–99)
Glucose-Capillary: 151 mg/dL — ABNORMAL HIGH (ref 70–99)
Glucose-Capillary: 165 mg/dL — ABNORMAL HIGH (ref 70–99)

## 2018-08-18 MED ORDER — FLUCONAZOLE 100 MG PO TABS
200.0000 mg | ORAL_TABLET | Freq: Every day | ORAL | Status: DC
  Administered 2018-08-18 – 2018-08-21 (×4): 200 mg via ORAL
  Filled 2018-08-18 (×4): qty 2

## 2018-08-18 NOTE — Progress Notes (Signed)
PROGRESS NOTE    Jon [name]  BBC:[id number] DOB: 04/24/1946 DOA: 08/12/2018 PCP: Claretta Fraise, MD   Brief Narrative:  73 y.o.malewith a past medical history significant for abdominal aortic aneurysm, dementia with behavioral disturbances, type 2 diabetes mellitus with hyperglycemia, hypertensionandhyperlipidemia;who presented to the hospital secondary to intractable nausea/vomiting, fever, inability to keep anything down and complaining of abdominal pain. Apparently symptoms has been present for the last week and a half and worsening. Food makes his symptoms worse and at this moment he is having trouble even keeping his medications down. Patient ended experiencing a fall at home prior to coming to the hospital due to generalized weakness associated with ongoing symptoms and dehydration. Per reports by family at bedside they have visited a different emergency department where work-up demonstrated concern for biliary abnormalities as probably 1 of the main reasons for his symptoms. At that time patient declines any further work-up and ended going home. Symptom has continue progressing and given inability to keep anything down they came once again to the hospital for further evaluation and management.  In ED patient was found to have a fever of 102, elevated WBCs, signs of dehydration with hemoconcentrated samples and elevated hemoglobin level. Chest x-ray demonstrated bibasilar infiltrates and bronchitic changes. Patient actively throwing up in the ED. He had elevated LFTs and a bilirubin of 1.5. CT abdomen demonstrated cholelithiasis right upper quadrant ultrasound with cholelithiasis and gallbladder sludge. Patient was also found to be positive for influenza A. Antiemetics and fluid resuscitation given in the ED.he continues to have LFTsthat are trending up, with some stabilizing. HIDA scan within normal limits.   Assessment & Plan:   Principal Problem:   Intractable nausea  and vomiting Active Problems:   Uncontrolled type 2 diabetes mellitus with complication, with long-term current use of insulin (HCC)   HTN (hypertension)   AAA (abdominal aortic aneurysm) without rupture (HCC)   Hyperlipidemia   Lewy body dementia with behavioral disturbance (HCC)   Elevated LFTs   Calculus of gallbladder without cholecystitis without obstruction  1-intractable nausea and vomiting-with transaminitis-stabilizing: -Multifactorial: With concern for biliary colic/cholelithiasis versus influenzaAinfection and aspiration PNA. -Continue to monitor LFTs and bilirubinthat appear to be stabilizing. GGT is elevated.  LFTs are stable, but alk phos continues to elevate.  Will consult GI for a.m. to evaluate further. -CT abdomen demonstrated a mobile gallstone 1.5 cm and also a sludge in his gallbladder ultrasound. -Advance diet to heart healthy/carb modified today -Tamiflu treatment completed and will maintain on Unasyn for 1 more day. -followclinical response.  2-influenza A -Tamiflu treatment completed. -Continue supportive care and fluid resuscitation.  3-pneumonia: Appreciated bilateral infiltrates on x-ray,elevated WBCs, fever and productive cough. -Given history of ongoing intractable vomiting high concern for aspiration -Continue oxygen supplementation -Continue as needed DuoNeb -Continue antibiotic treatment with Unasyn  4-uncontrolled type 2 diabetes mellitus -Continue holding hypoglycemic agents while inpatient -Continue sliding scale insulin. -Check CBGsQAC/nightly.  5-hypertension -Continue holding oral antihypertensive medication regimen-continue as needed hydralazine. -Blood pressure stable.  6-AAA without rupture -Transverse diameter 3.3 cm -Recommend follow-up by ultrasound in 3 years.  7-hyperlipidemia/elevated LFTs with elevated GGT -Will continue holding statin for now -LFTs stable -Continue fluid resuscitation. -Follow recommendation  by general surgery.  8-dementia with behavioral disturbances -Continue supportive care -Continue the use of Celexa and Exelon  9-hypokalemia-resolved -Repeat labs in a.m.  10-mild dysuria secondary to candiduria -We will start fluconazole today  DVT prophylaxis:Heparin Code Status:Full code Family Communication:Wife and daughter at bedside Disposition Plan:Continue IV fluid  as well as IV antibiotics with supportive care. PT evaluation ordered and pending. Continue to wean oxygen.   Consultants:  General surgery  Procedures:  See below for x-ray reports  Antimicrobials:  Tamiflu 2/10->2/15  Unasyn 2/10-> (will dc am)  Subjective: Patient seen and evaluated today with no new acute complaints or concerns. No acute concerns or events noted overnight.  He appears to be doing somewhat better, but is quite weak and requires a lot of assistance with transferring.  Objective: Vitals:   08/17/18 2042 08/17/18 2107 08/18/18 0600 08/18/18 0822  BP:  123/64 (!) 142/7   Pulse: 85 79 71   Resp: 18 (!) 24 (!) 24   Temp:  98 F (36.7 C) 99.7 F (37.6 C)   TempSrc:  Oral Oral   SpO2: (!) 85% 93% 94% 90%  Weight:      Height:        Intake/Output Summary (Last 24 hours) at 08/18/2018 1055 Last data filed at 08/18/2018 0700 Gross per 24 hour  Intake 3030 ml  Output 1875 ml  Net 1155 ml   Filed Weights   08/12/18 0853  Weight: 81 kg    Examination:  General exam: Appears calm and comfortable  Respiratory system: Clear to auscultation. Respiratory effort normal.  On nasal cannula. Cardiovascular system: S1 & S2 heard, RRR. No JVD, murmurs, rubs, gallops or clicks. No pedal edema. Gastrointestinal system: Abdomen is nondistended, soft and nontender. No organomegaly or masses felt. Normal bowel sounds heard. Central nervous system: Alert and oriented. No focal neurological deficits. Extremities: Symmetric 5 x 5 power. Skin: No rashes, lesions or  ulcers Psychiatry: Difficult to evaluate.    Data Reviewed: I have personally reviewed following labs and imaging studies  CBC: Recent Labs  Lab 08/12/18 0928 08/13/18 0546 08/15/18 0659 08/16/18 0548 08/18/18 0621  WBC 14.2* 12.0* 8.5 7.2 9.1  NEUTROABS 11.3*  --   --   --   --   HGB 17.1* 14.2 13.5 13.2 12.3*  HCT 50.8 43.2 40.5 40.0 37.3*  MCV 91.2 91.9 91.8 89.7 90.3  PLT 168 140* 143* 169 540   Basic Metabolic Panel: Recent Labs  Lab 08/13/18 0546 08/14/18 0535 08/15/18 0659 08/16/18 0548 08/17/18 0638 08/18/18 0621  NA 135 135 134* 133* 136 133*  K 4.1 3.9 3.3* 3.3* 2.9* 3.6  CL 104 104 104 103 106 107  CO2 '24 23 22 ' 21* 22 19*  GLUCOSE 138* 147* 118* 138* 133* 137*  BUN '15 11 10 9 9 ' 7*  CREATININE 0.93 0.77 0.63 0.69 0.64 0.63  CALCIUM 8.4* 8.0* 7.9* 8.1* 7.8* 7.9*  MG 1.9  --   --  2.1  --   --   PHOS 2.9  --   --   --   --   --    GFR: Estimated Creatinine Clearance: 88.7 mL/min (by C-G formula based on SCr of 0.63 mg/dL). Liver Function Tests: Recent Labs  Lab 08/14/18 0535 08/15/18 0659 08/16/18 0548 08/17/18 0638 08/18/18 0621  AST 72* 81* 70* 89* 87*  ALT 88* 95* 86* 93* 99*  ALKPHOS 190* 223* 257* 263* 270*  BILITOT 1.5* 1.2 1.4* 1.1 1.0  PROT 6.0* 6.1* 6.4* 6.0* 6.3*  ALBUMIN 2.7* 2.7* 2.7* 2.5* 2.5*   Recent Labs  Lab 08/12/18 0928  LIPASE 30   Recent Labs  Lab 08/15/18 1511  AMMONIA 30   Coagulation Profile: Recent Labs  Lab 08/15/18 1511  INR 1.07   Cardiac Enzymes: Recent Labs  Lab 08/12/18 0928  TROPONINI <0.03   BNP (last 3 results) No results for input(s): PROBNP in the last 8760 hours. HbA1C: No results for input(s): HGBA1C in the last 72 hours. CBG: Recent Labs  Lab 08/17/18 0924 08/17/18 1142 08/17/18 1608 08/17/18 2109 08/18/18 0733  GLUCAP 217* 218* 185* 146* 118*   Lipid Profile: No results for input(s): CHOL, HDL, LDLCALC, TRIG, CHOLHDL, LDLDIRECT in the last 72 hours. Thyroid Function  Tests: No results for input(s): TSH, T4TOTAL, FREET4, T3FREE, THYROIDAB in the last 72 hours. Anemia Panel: No results for input(s): VITAMINB12, FOLATE, FERRITIN, TIBC, IRON, RETICCTPCT in the last 72 hours. Sepsis Labs: No results for input(s): PROCALCITON, LATICACIDVEN in the last 168 hours.  Recent Results (from the past 240 hour(s))  Urine culture     Status: None   Collection Time: 08/12/18  9:08 AM  Result Value Ref Range Status   Specimen Description   Final    URINE, RANDOM Performed at [facility], [address] [place], [place] [address]    Special Requests   Final    NONE Performed at [facility], [address], [place], [place] [address]    Culture   Final    NO GROWTH Performed at [facility], [place] [address] [place], [place] [address]    Report Status 08/13/2018 FINAL  Final  Group A Strep by PCR     Status: None   Collection Time: 08/12/18 11:41 AM  Result Value Ref Range Status   Group A Strep by PCR NOT DETECTED NOT DETECTED Final    Comment: Performed at [facility], [address], [place], [place] [address]  Culture, blood (routine x 2)     Status: None   Collection Time: 08/12/18  1:17 PM  Result Value Ref Range Status   Specimen Description BLOOD LEFT ANTECUBITAL  Final   Special Requests   Final    BOTTLES DRAWN AEROBIC AND ANAEROBIC Blood Culture adequate volume   Culture   Final    NO GROWTH 5 DAYS Performed at [facility], [address] [place], [place] [address]    Report Status 08/17/2018 FINAL  Final  Culture, blood (routine x 2)     Status: None   Collection Time: 08/12/18  5:43 PM  Result Value Ref Range Status   Specimen Description BLOOD LEFT ANTECUBITAL  Final   Special Requests   Final    BOTTLES DRAWN AEROBIC AND ANAEROBIC Blood Culture adequate volume   Culture   Final    NO GROWTH 5 DAYS Performed at [facility], [address], [place], [place] [address]    Report Status 08/17/2018 FINAL  Final          Radiology Studies: No results found.      Scheduled Meds: . citalopram  10 mg Oral Daily  . fluconazole  200 mg Oral Daily  . heparin  5,000 Units Subcutaneous Q8H  . insulin aspart  0-5 Units Subcutaneous QHS  . insulin aspart  0-9 Units Subcutaneous TID WC  . ipratropium-albuterol  3 mL Nebulization TID  . pantoprazole  40 mg Oral BID  . potassium chloride  40 mEq Oral BID  . rivastigmine  4.5 mg Oral BID   Continuous Infusions: . sodium chloride 100 mL/hr at 08/18/18 0652  . ampicillin-sulbactam (UNASYN) IV 3 g (08/18/18 0521)     LOS: 6 days    Time spent: 30 minutes    Jon Ensz Darleen Crocker, DO [facility] Pager  [phone]  If 7PM-7AM, please contact night-coverage [email or web address] Password [facility] 08/18/2018, 10:55 AM

## 2018-08-18 NOTE — Progress Notes (Signed)
Pharmacy Antibiotic Note  Today is day #7 of Unasyn therapy for this 73 yo male  admitted on 08/12/2018 with aspiration pneumonia.   Plan: Continue Unasyn 3gm IV q8h F/U cxs and clinical progress Assess antibiotic duration.  Height: 5\' 10"  (177.8 cm) Weight: 178 lb 9.2 oz (81 kg) IBW/kg (Calculated) : 73  Temp (24hrs), Avg:98.8 F (37.1 C), Min:98 F (36.7 C), Max:99.7 F (37.6 C)  Recent Labs  Lab 08/12/18 0928 08/13/18 0546 08/14/18 0535 08/15/18 0659 08/16/18 0548 08/17/18 0638 08/18/18 0621  WBC 14.2* 12.0*  --  8.5 7.2  --  9.1  CREATININE 0.97 0.93 0.77 0.63 0.69 0.64 0.63    Estimated Creatinine Clearance: 88.7 mL/min (by C-G formula based on SCr of 0.63 mg/dL).    Allergies  Allergen Reactions  . Donepezil Nausea And Vomiting  . Namenda [Memantine Hcl] Other (See Comments)    Sleeps all the time    Antimicrobials this admission: Unasyn 2/10 >>  Tamiflu treatment completed   Microbiology results: 2/10 BC x2: NG  2/10 UCx: NG 2/10 Influenza: +  Thank you for allowing pharmacy to be a part of this patient's care.  Judeth Cornfield, PharmD Clinical Pharmacist 08/18/2018 10:28 AM

## 2018-08-19 LAB — COMPREHENSIVE METABOLIC PANEL
ALT: 79 U/L — ABNORMAL HIGH (ref 0–44)
AST: 56 U/L — ABNORMAL HIGH (ref 15–41)
Albumin: 2.4 g/dL — ABNORMAL LOW (ref 3.5–5.0)
Alkaline Phosphatase: 249 U/L — ABNORMAL HIGH (ref 38–126)
Anion gap: 9 (ref 5–15)
BUN: 6 mg/dL — ABNORMAL LOW (ref 8–23)
CO2: 19 mmol/L — ABNORMAL LOW (ref 22–32)
Calcium: 8.3 mg/dL — ABNORMAL LOW (ref 8.9–10.3)
Chloride: 108 mmol/L (ref 98–111)
Creatinine, Ser: 0.63 mg/dL (ref 0.61–1.24)
GFR calc non Af Amer: >60 mL/min (ref >60)
Glucose, Bld: 144 mg/dL — ABNORMAL HIGH (ref 70–99)
POTASSIUM: 3.9 mmol/L (ref 3.5–5.1)
SODIUM: 136 mmol/L (ref 135–145)
Total Bilirubin: 0.9 mg/dL (ref 0.3–1.2)
Total Protein: 6.4 g/dL — ABNORMAL LOW (ref 6.5–8.1)

## 2018-08-19 LAB — GLUCOSE, CAPILLARY
GLUCOSE-CAPILLARY: 145 mg/dL — AB (ref 70–99)
Glucose-Capillary: 143 mg/dL — ABNORMAL HIGH (ref 70–99)
Glucose-Capillary: 136 mg/dL — ABNORMAL HIGH (ref 70–99)
Glucose-Capillary: 126 mg/dL — ABNORMAL HIGH (ref 70–99)

## 2018-08-19 LAB — CBC
HCT: 37.9 % — ABNORMAL LOW (ref 39.0–52.0)
Hemoglobin: 12.8 g/dL — ABNORMAL LOW (ref 13.0–17.0)
MCH: 30.7 pg (ref 26.0–34.0)
MCHC: 33.8 g/dL (ref 30.0–36.0)
MCV: 90.9 fL (ref 80.0–100.0)
Platelets: 222 10*3/uL (ref 150–400)
RBC: 4.17 MIL/uL — ABNORMAL LOW (ref 4.22–5.81)
RDW: 13.2 % (ref 11.5–15.5)
WBC: 9.8 10*3/uL (ref 4.0–10.5)
nRBC: 0.2 % (ref 0.0–0.2)

## 2018-08-19 MED ORDER — ONDANSETRON HCL 4 MG/2ML IJ SOLN
4.0000 mg | Freq: Four times a day (QID) | INTRAMUSCULAR | Status: DC | PRN
  Administered 2018-08-21: 4 mg via INTRAVENOUS
  Filled 2018-08-19: qty 2

## 2018-08-19 NOTE — Clinical Social Work Placement (Signed)
   CLINICAL SOCIAL WORK PLACEMENT  NOTE  Date:  08/19/2018  Patient Details  Name: Jon [name] MRN: [id number] Date of Birth: 1945/09/27  Clinical Social Work is seeking post-discharge placement for this patient at the Skilled  Nursing Facility level of care (*CSW will initial, date and re-position this form in  chart as items are completed):  Yes   Patient/family provided with [place] Clinical Social Work Department's list of facilities offering this level of care within the geographic area requested by the patient (or if unable, by the patient's family).  Yes   Patient/family informed of their freedom to choose among providers that offer the needed level of care, that participate in Medicare, Medicaid or managed care program needed by the patient, have an available bed and are willing to accept the patient.  Yes   Patient/family informed of [facility]'s ownership interest in [facility] and [facility], as well as of the fact that they are under no obligation to receive care at these facilities.  PASRR submitted to EDS on 08/19/18     PASRR number received on 08/19/18     Existing PASRR number confirmed on       FL2 transmitted to all facilities in geographic area requested by pt/family on 08/19/18     FL2 transmitted to all facilities within larger geographic area on       Patient informed that his/her managed care company has contracts with or will negotiate with certain facilities, including the following:            Patient/family informed of bed offers received.  Patient chooses bed at       Physician recommends and patient chooses bed at      Patient to be transferred to   on  .  Patient to be transferred to facility by       Patient family notified on   of transfer.  Name of family member notified:        PHYSICIAN       Additional Comment:    _______________________________________________ Annice Needy, LCSW 08/19/2018, 5:17 PM

## 2018-08-19 NOTE — NC FL2 (Signed)
[place] MEDICAID FL2 LEVEL OF CARE SCREENING TOOL     IDENTIFICATION  Patient Name: Jon [name] Birthdate: 1945/10/22 Sex: male Admission Date (Current Location): 08/12/2018  [facility] and [place] Number:  [employer] and Address:  [facility],  [address] [place] [address]      Provider Number: [id number]  Attending Physician Name and Address:  Erick Blinks, DO  Relative Name and Phone Number:  Clell Schwaderer (wife) [phone]    Current Level of Care: Hospital Recommended Level of Care: Skilled Nursing Facility Prior Approval Number:    Date Approved/Denied:   PASRR Number: [id number]  Discharge Plan: SNF    Current Diagnoses: Patient Active Problem List   Diagnosis Date Noted  . Calculus of gallbladder without cholecystitis without obstruction   . Intractable nausea and vomiting 08/12/2018  . Elevated LFTs 08/12/2018  . Lewy body dementia with behavioral disturbance (HCC) 03/12/2018  . Muscular incoordination 03/26/2017  . Abnormal chest CT 01/25/2016  . Lumbar compression fracture (HCC) 01/25/2016  . Bullous emphysema (HCC) 09/28/2015  . Microalbuminuria due to type 2 diabetes mellitus (HCC) 03/12/2015  . Mild cognitive disorder 05/15/2013  . Vitamin D deficiency   . Hyperlipidemia 03/19/2013  . Uncontrolled type 2 diabetes mellitus with complication, with long-term current use of insulin (HCC) 02/06/2013  . HTN (hypertension) 02/06/2013  . AAA (abdominal aortic aneurysm) without rupture (HCC) 02/06/2013  . Clubbing of fingers 02/06/2013  . Ex-smoker 02/06/2013  . History of unsteady gait 02/06/2013    Orientation RESPIRATION BLADDER Height & Weight     Self, Place, Situation  Normal Continent Weight: 178 lb 9.2 oz (81 kg) Height:  5\' 10"  (177.8 cm)  BEHAVIORAL SYMPTOMS/MOOD NEUROLOGICAL BOWEL NUTRITION STATUS      Continent Diet(see dc summaruy)  AMBULATORY STATUS COMMUNICATION OF NEEDS Skin   Extensive Assist  Verbally Normal                       Personal Care Assistance Level of Assistance  Bathing, Feeding, Dressing Bathing Assistance: Limited assistance Feeding assistance: Limited assistance Dressing Assistance: Limited assistance     Functional Limitations Info  Sight, Hearing, Speech Sight Info: Adequate Hearing Info: Adequate Speech Info: Adequate    SPECIAL CARE FACTORS FREQUENCY  PT (By licensed PT)     PT Frequency: 5 times week              Contractures Contractures Info: Not present    Additional Factors Info  Code Status, Allergies, Psychotropic Code Status Info: full Allergies Info: Donepezil, Namenda Psychotropic Info: Celexa         Current Medications (08/19/2018):  This is the current hospital active medication list Current Facility-Administered Medications  Medication Dose Route Frequency Provider Last Rate Last Dose  . acetaminophen (TYLENOL) tablet 650 mg  650 mg Oral Q6H PRN Vassie Loll, MD   650 mg at 08/18/18 1254   Or  . acetaminophen (TYLENOL) suppository 650 mg  650 mg Rectal Q6H PRN Vassie Loll, MD      . citalopram (CELEXA) tablet 10 mg  10 mg Oral Daily Vassie Loll, MD   10 mg at 08/19/18 0859  . fluconazole (DIFLUCAN) tablet 200 mg  200 mg Oral Daily Sherryll Burger, Pratik D, DO   200 mg at 08/19/18 0859  . heparin injection 5,000 Units  5,000 Units Subcutaneous Q8H Vassie Loll, MD   5,000 Units at 08/18/18 2135  . hydrALAZINE (APRESOLINE) injection 10 mg  10 mg Intravenous Q8H PRN Vassie Loll, MD      . insulin aspart (novoLOG) injection 0-5 Units  0-5 Units Subcutaneous QHS Vassie Loll, MD   2 Units at 08/16/18 2300  . insulin aspart (novoLOG) injection 0-9 Units  0-9 Units Subcutaneous TID WC Vassie Loll, MD   1 Units at 08/19/18 1139  . ipratropium-albuterol (DUONEB) 0.5-2.5 (3) MG/3ML nebulizer solution 3 mL  3 mL Nebulization Q6H PRN Opyd, Lavone Neri, MD   3 mL at 08/16/18 0455  . ipratropium-albuterol (DUONEB) 0.5-2.5  (3) MG/3ML nebulizer solution 3 mL  3 mL Nebulization TID Sherryll Burger, Pratik D, DO   3 mL at 08/19/18 0807  . morphine 2 MG/ML injection 1 mg  1 mg Intravenous Q3H PRN Vassie Loll, MD      . ondansetron [facility]) injection 4 mg  4 mg Intravenous Q6H PRN Sherryll Burger, Pratik D, DO      . pantoprazole (PROTONIX) EC tablet 40 mg  40 mg Oral BID Vassie Loll, MD   40 mg at 08/19/18 0859  . potassium chloride SA (K-DUR,KLOR-CON) CR tablet 40 mEq  40 mEq Oral BID Maurilio Lovely D, DO   40 mEq at 08/19/18 0859  . prochlorperazine (COMPAZINE) injection 10 mg  10 mg Intravenous Q6H PRN Vassie Loll, MD      . rivastigmine (EXELON) capsule 4.5 mg  4.5 mg Oral BID Vassie Loll, MD   4.5 mg at 08/19/18 0900     Discharge Medications: Please see discharge summary for a list of discharge medications.  Relevant Imaging Results:  Relevant Lab Results:   Additional Information SSN: [id number] LCSW

## 2018-08-19 NOTE — Progress Notes (Signed)
PROGRESS NOTE    Jon [name]  PPI:[id number] DOB: Nov 24, 1945 DOA: 08/12/2018 PCP: Mechele Claude, MD   Brief Narrative:  73 y.o.malewith a past medical history significant for abdominal aortic aneurysm, dementia with behavioral disturbances, type 2 diabetes mellitus with hyperglycemia, hypertensionandhyperlipidemia;who presented to the hospital secondary to intractable nausea/vomiting, fever, inability to keep anything down and complaining of abdominal pain. Apparently symptoms has been present for the last week and a half and worsening. Food makes his symptoms worse and at this moment he is having trouble even keeping his medications down. Patient ended experiencing a fall at home prior to coming to the hospital due to generalized weakness associated with ongoing symptoms and dehydration. Per reports by family at bedside they have visited a different emergency department where work-up demonstrated concern for biliary abnormalities as probably 1 of the main reasons for his symptoms. At that time patient declines any further work-up and ended going home. Symptom has continue progressing and given inability to keep anything down they came once again to the hospital for further evaluation and management.  In ED patient was found to have a fever of 102, elevated WBCs, signs of dehydration with hemoconcentrated samples and elevated hemoglobin level. Chest x-ray demonstrated bibasilar infiltrates and bronchitic changes. Patient actively throwing up in the ED. He had elevated LFTs and a bilirubin of 1.5. CT abdomen demonstrated cholelithiasis right upper quadrant ultrasound with cholelithiasis and gallbladder sludge. Patient was also found to be positive for influenza A. Antiemetics and fluid resuscitation given in the ED.he continues to have LFTsthat are trending up, with some stabilizing. HIDA scan within normal limits.   Assessment & Plan:   Principal Problem:   Intractable nausea  and vomiting Active Problems:   Uncontrolled type 2 diabetes mellitus with complication, with long-term current use of insulin (HCC)   HTN (hypertension)   AAA (abdominal aortic aneurysm) without rupture (HCC)   Hyperlipidemia   Lewy body dementia with behavioral disturbance (HCC)   Elevated LFTs   Calculus of gallbladder without cholecystitis without obstruction  1-intractable nausea and vomiting-with transaminitis-stabilizing: -Multifactorial: With concern for biliary colic/cholelithiasis versus influenzaAinfection and aspiration PNA. -Continue to monitor LFTs and bilirubinthat appear to be stabilizing.GGT is elevated.  LFTs are stable and are now downtrending.  Will cancel GI consultation will recommend outpatient follow-up. -CT abdomen demonstrated a mobile gallstone 1.5 cm and also a sludge in his gallbladder ultrasound. -Advance diet to heart healthy/carb modified today -Tamiflu and Unasyn treatment completed. -followclinical response -Remove Phenergan and start Zofran to prevent sedation.  2-influenza A -Tamiflu treatment completed. -Continue supportive care and fluid resuscitation.  3-pneumonia: Appreciated bilateral infiltrates on x-ray,elevated WBCs, fever and productive cough. -Given history of ongoing intractable vomiting high concern for aspiration -Continue oxygen supplementation -Continue as needed DuoNeb -Continue antibiotic treatment with Unasyn  4-uncontrolled type 2 diabetes mellitus-now improved -Continue holding hypoglycemic agents while inpatient -Continue sliding scale insulin. -Check CBGsQAC/nightly.  5-hypertension -Continue holding oral antihypertensive medication regimen-continue as needed hydralazine. -Blood pressure stable.  6-AAA without rupture -Transverse diameter 3.3 cm -Recommend follow-up by ultrasound in 3 years.  7-hyperlipidemia/elevated LFTswith elevated GGT -Will continue holding statin for now -LFTsstable -Continue  fluid resuscitation. -Follow recommendation by general surgery.  8-dementia with behavioral disturbances -Continue supportive care -Continue the use of Celexa and Exelon -May need stimulating agent to assist with motivation and participation PT.  9-hypokalemia-resolved -Repeat labs in a.m.  10-mild dysuria secondary to candiduria -We will start fluconazole   DVT prophylaxis:Heparin Code Status:Full code Family Communication:Wifeat  bedside Disposition Plan:Continue IV fluid as well as IV antibiotics with supportive care. PT evaluation with recommendations for skilled nursing facility. Continue to wean oxygen as tolerated.   Consultants:  General surgery  Procedures:  See below for x-ray reports  Antimicrobials:  Tamiflu 2/10->2/15  Unasyn 2/10-> 2/17  Fluconazole 2/16->    Subjective: Patient seen and evaluated today with no new acute complaints or concerns. No acute concerns or events noted overnight.  He is more somnolent this morning, but has been receiving some Phenergan for nausea.  Wife states that he was able to eat some of his meals yesterday, but he is generally unmotivated to do anything including physical therapy.  Objective: Vitals:   08/18/18 2049 08/18/18 2148 08/19/18 0510 08/19/18 0807  BP:  (!) 145/77 (!) 123/57   Pulse: 70 73 71   Resp: 20 18 20    Temp:  99.4 F (37.4 C) 98.8 F (37.1 C)   TempSrc:  Oral Oral   SpO2: 92% 93% (!) 86% (!) 86%  Weight:      Height:        Intake/Output Summary (Last 24 hours) at 08/19/2018 1158 Last data filed at 08/19/2018 0508 Gross per 24 hour  Intake 2903.22 ml  Output 3500 ml  Net -596.78 ml   Filed Weights   08/12/18 0853  Weight: 81 kg    Examination:  General exam: Appears calm and somnolent Respiratory system: Clear to auscultation. Respiratory effort normal.  Currently on breathing treatment Cardiovascular system: S1 & S2 heard, RRR. No JVD, murmurs, rubs, gallops or  clicks. No pedal edema. Gastrointestinal system: Abdomen is nondistended, soft and nontender. No organomegaly or masses felt. Normal bowel sounds heard. Central nervous system: Somnolent Extremities: Symmetric 5 x 5 power. Skin: No rashes, lesions or ulcers Psychiatry: Cannot be assessed    Data Reviewed: I have personally reviewed following labs and imaging studies  CBC: Recent Labs  Lab 08/13/18 0546 08/15/18 0659 08/16/18 0548 08/18/18 0621 08/19/18 0540  WBC 12.0* 8.5 7.2 9.1 9.8  HGB 14.2 13.5 13.2 12.3* 12.8*  HCT 43.2 40.5 40.0 37.3* 37.9*  MCV 91.9 91.8 89.7 90.3 90.9  PLT 140* 143* 169 198 222   Basic Metabolic Panel: Recent Labs  Lab 08/13/18 0546  08/15/18 0659 08/16/18 0548 08/17/18 0638 08/18/18 0621 08/19/18 0540  NA 135   < > 134* 133* 136 133* 136  K 4.1   < > 3.3* 3.3* 2.9* 3.6 3.9  CL 104   < > 104 103 106 107 108  CO2 24   < > 22 21* 22 19* 19*  GLUCOSE 138*   < > 118* 138* 133* 137* 144*  BUN 15   < > 10 9 9  7* 6*  CREATININE 0.93   < > 0.63 0.69 0.64 0.63 0.63  CALCIUM 8.4*   < > 7.9* 8.1* 7.8* 7.9* 8.3*  MG 1.9  --   --  2.1  --   --   --   PHOS 2.9  --   --   --   --   --   --    < > = values in this interval not displayed.   GFR: Estimated Creatinine Clearance: 88.7 mL/min (by C-G formula based on SCr of 0.63 mg/dL). Liver Function Tests: Recent Labs  Lab 08/15/18 0659 08/16/18 0548 08/17/18 0638 08/18/18 0621 08/19/18 0540  AST 81* 70* 89* 87* 56*  ALT 95* 86* 93* 99* 79*  ALKPHOS 223* 257* 263* 270* 249*  BILITOT 1.2 1.4* 1.1 1.0 0.9  PROT 6.1* 6.4* 6.0* 6.3* 6.4*  ALBUMIN 2.7* 2.7* 2.5* 2.5* 2.4*   No results for input(s): LIPASE, AMYLASE in the last 168 hours. Recent Labs  Lab 08/15/18 1511  AMMONIA 30   Coagulation Profile: Recent Labs  Lab 08/15/18 1511  INR 1.07   Cardiac Enzymes: No results for input(s): CKTOTAL, CKMB, CKMBINDEX, TROPONINI in the last 168 hours. BNP (last 3 results) No results for input(s):  PROBNP in the last 8760 hours. HbA1C: No results for input(s): HGBA1C in the last 72 hours. CBG: Recent Labs  Lab 08/18/18 1134 08/18/18 1603 08/18/18 2151 08/19/18 0817 08/19/18 1123  GLUCAP 162* 151* 165* 143* 145*   Lipid Profile: No results for input(s): CHOL, HDL, LDLCALC, TRIG, CHOLHDL, LDLDIRECT in the last 72 hours. Thyroid Function Tests: No results for input(s): TSH, T4TOTAL, FREET4, T3FREE, THYROIDAB in the last 72 hours. Anemia Panel: No results for input(s): VITAMINB12, FOLATE, FERRITIN, TIBC, IRON, RETICCTPCT in the last 72 hours. Sepsis Labs: No results for input(s): PROCALCITON, LATICACIDVEN in the last 168 hours.  Recent Results (from the past 240 hour(s))  Urine culture     Status: None   Collection Time: 08/12/18  9:08 AM  Result Value Ref Range Status   Specimen Description   Final    URINE, RANDOM Performed at [facility], [address] [place], [place] [address]    Special Requests   Final    NONE Performed at [facility], [address], [place], [place] [address]    Culture   Final    NO GROWTH Performed at [facility], [address], [place], [place] [address]    Report Status 08/13/2018 FINAL  Final  Group A Strep by PCR     Status: None   Collection Time: 08/12/18 11:41 AM  Result Value Ref Range Status   Group A Strep by PCR NOT DETECTED NOT DETECTED Final    Comment: Performed at [facility], [address], [place], [place] [address]  Culture, blood (routine x 2)     Status: None   Collection Time: 08/12/18  1:17 PM  Result Value Ref Range Status   Specimen Description BLOOD LEFT ANTECUBITAL  Final   Special Requests   Final    BOTTLES DRAWN AEROBIC AND ANAEROBIC Blood Culture adequate volume   Culture   Final    NO GROWTH 5 DAYS Performed at [facility], [address], [place], [place] [address]    Report Status 08/17/2018 FINAL  Final  Culture, blood (routine x 2)     Status: None   Collection Time: 08/12/18  5:43 PM    Result Value Ref Range Status   Specimen Description BLOOD LEFT ANTECUBITAL  Final   Special Requests   Final    BOTTLES DRAWN AEROBIC AND ANAEROBIC Blood Culture adequate volume   Culture   Final    NO GROWTH 5 DAYS Performed at [facility], [address], [place], [place] [address]    Report Status 08/17/2018 FINAL  Final         Radiology Studies: No results found.      Scheduled Meds: . citalopram  10 mg Oral Daily  . fluconazole  200 mg Oral Daily  . heparin  5,000 Units Subcutaneous Q8H  . insulin aspart  0-5 Units Subcutaneous QHS  . insulin aspart  0-9 Units Subcutaneous TID WC  . ipratropium-albuterol  3 mL Nebulization TID  . pantoprazole  40  mg Oral BID  . potassium chloride  40 mEq Oral BID  . rivastigmine  4.5 mg Oral BID   Continuous Infusions: . sodium chloride 100 mL/hr at 08/19/18 0302     LOS: 7 days    Time spent: 30 minutes    Pratik Hoover BrunetteD Shah, DO [facility] Pager [phone]  If 7PM-7AM, please contact night-coverage [email or web address] Password [email or web address] 08/19/2018, 11:58 AM

## 2018-08-19 NOTE — Clinical Social Work Note (Signed)
Clinical Social Work Assessment  Patient Details  Name: Jon [name] MRN: [id number] Date of Birth: 1946-02-03  Date of referral:  08/19/18               Reason for consult:  Facility Placement                Permission sought to share information with:    Permission granted to share information::     Name::        Agency::     Relationship::     Contact Information:     Housing/Transportation Living arrangements for the past 2 months:  Single Family Home Source of Information:  Adult Children, Spouse Patient Interpreter Needed:  None Criminal Activity/Legal Involvement Pertinent to Current Situation/Hospitalization:  No - Comment as needed Significant Relationships:  Adult Children, Spouse Lives with:  Adult Children, Spouse Do you feel safe going back to the place where you live?  Yes Need for family participation in patient care:  Yes (Comment)  Care giving concerns:  Spouse is caregiver and indicates that she will have difficulty caring for patient in his weakened stated.    Social Worker assessment / plan:  At baseline, patient uses a wheelchair and  ADLs are completed by his wife.  Family is interested in SNF for short term rehab purposes.    Employment status:  Retired Health and safety inspector:  Medicare PT Recommendations:  Skilled Nursing Facility Information / Referral to community resources:  Skilled Nursing Facility  Patient/Family's Response to care:  Family is agreeable to short term rehab at [facility].   Patient/Family's Understanding of and Emotional Response to Diagnosis, Current Treatment, and Prognosis:  Family understands patient's diagnosis, treatment and prognosis.   Emotional Assessment Appearance:  Appears stated age Attitude/Demeanor/Rapport:    Affect (typically observed):    Orientation:  Oriented to Self, Oriented to Place, Oriented to Situation Alcohol / Substance use:  Not Applicable Psych involvement (Current and /or in the community):  No  (Comment)  Discharge Needs  Concerns to be addressed:  Discharge Planning Concerns Readmission within the last 30 days:  No Current discharge risk:  None Barriers to Discharge:  No Barriers Identified   Annice Needy, LCSW 08/19/2018, 5:21 PM

## 2018-08-19 NOTE — Plan of Care (Signed)
  Problem: Acute Rehab PT Goals(only PT should resolve) Goal: Pt Will Go Sit To Supine/Side Outcome: Progressing Flowsheets (Taken 08/19/2018 1134) Pt will go Sit to Supine/Side: with min guard assist Goal: Patient Will Transfer Sit To/From Stand Outcome: Progressing Flowsheets (Taken 08/19/2018 1134) Patient will transfer sit to/from stand: with minimal assist Note:  With RW Goal: Pt Will Transfer Bed To Chair/Chair To Bed Outcome: Progressing Flowsheets (Taken 08/19/2018 1134) Pt will Transfer Bed to Chair/Chair to Bed: with min assist Note:  With RW Goal: Pt Will Ambulate Outcome: Progressing Flowsheets (Taken 08/19/2018 1134) Pt will Ambulate: 15 feet; with minimal assist; with rolling walker Goal: PT Additional Goal #1 Outcome: Progressing Flowsheets (Taken 08/19/2018 1134) Additional Goal #1: Pt will self propel WC using bil LE/UE for 50 ft with min assist for steering   11:36 AM, 08/19/18 Domenick Bookbinder, DPT Physical Therapist with [facility] [phone] office

## 2018-08-19 NOTE — Evaluation (Signed)
Physical Therapy Evaluation Patient Details Name: Jon Howellsony L Haraway MRN: [id number] DOB: Oct 23, 1945 Today's Date: 08/19/2018   History of Present Illness  Jon [name] is a 73 y.o. male with a past medical history significant for abdominal aortic aneurysm, dementia with behavioral disturbances, type 2 diabetes mellitus with hyperglycemia, hypertension and hyperlipidemia; who presented to the hospital secondary to intractable nausea/vomiting, fever, inability to keep anything down and complaining of abdominal pain.  Apparently symptoms has been present for the last week and a half and worsening.  Food makes his symptoms worse and at this moment he is having trouble even keeping his medications down.  Patient ended experiencing a fall at home prior to coming to the hospital due to generalized weakness associated with ongoing symptoms and dehydration.    Clinical Impression  Patient presents supine in bed with spouse at bedside, lethargic and anxious but agreeable to therapy. Pt on room air and O2 sat 74% upon arrival, pt's spouse reports she called nurse but no one has come to place pt's nasal cannula back on yet. Pt with nasal cannula back on, 2 LPM and O2 sat improves to 88% so continued to increase to 4 LPM and remains at 88% so nursing increased to 5 LPM and O2 sat 90-92% throughout remainder of therapy session. Patient below baseline for functional mobility and gait, limited secondary to generalized weakness and fatigue with activity and demonstrates poor balance in sitting and standing. Pt requires constant verbal and tactile cues to soothe anxiety and improve alertness during therapy. Pt requires mod assist for transfers and bil hand held assist due to anxiety, requires multiple attempts to rise, and verbal cues. Pt limited to 4-5 unsteady, labored steps at bedside. Pt limited secondary to lethargy and generalized fatigue. Patient left up in chair with bil LE elevated, 5 LPM via nasal cannula, call bell  in reach and spouse at bedside, educated to call nursing when pt wants to return to bed and verbalized understanding. Patient will benefit from continued physical therapy in hospital and recommended venue below to increase strength, balance, endurance for safe ADLs and gait.     Follow Up Recommendations SNF    Equipment Recommendations  None recommended by PT    Recommendations for Other Services       Precautions / Restrictions Precautions Precautions: Fall Restrictions Weight Bearing Restrictions: No      Mobility  Bed Mobility Overal bed mobility: Needs Assistance Bed Mobility: Supine to Sit     Supine to sit: Min assist     General bed mobility comments: increased time, verbal and tactile cues for sequencing, assistance for B LE management and trunk uprighting  Transfers Overall transfer level: Needs assistance Equipment used: Rolling walker (2 wheeled);None;1 person hand held assist Transfers: Sit to/from Agilent TechnologiesStand;Stand Pivot Transfers;Squat Pivot Transfers Sit to Stand: Mod assist Stand pivot transfers: Mod assist Squat pivot transfers: Mod assist     General transfer comment: Attempted with RW, but pt too anxious and unable to complete requiring bil hand held assist for all transfers, unsteadiness upon standing, flexed trunk with forward heard  Ambulation/Gait Ambulation/Gait assistance: Mod assist Gait Distance (Feet): 4 Feet Assistive device: 1 person hand held assist Gait Pattern/deviations: Decreased step length - right;Decreased step length - left;Decreased stride length;Shuffle Gait velocity: decreased   General Gait Details: labored, unsteady 4-5 steps at bedisde with bil hand held assist, no loss of balance, limited secondary to fatigue  Stairs  Wheelchair Mobility    Modified Rankin (Stroke Patients Only)       Balance Overall balance assessment: Needs assistance Sitting-balance support: Feet supported;Bilateral upper extremity  supported Sitting balance-Leahy Scale: Poor Sitting balance - Comments: seated edge of bed with posterior pelvic tilt, weight shifting in all directions with leaning requiring min/mod assist to prevent loss of balance   Standing balance support: During functional activity;Bilateral upper extremity supported Standing balance-Leahy Scale: Poor Standing balance comment: with bil hand held assist, limited in duration                             Pertinent Vitals/Pain Pain Assessment: No/denies pain    Home Living Family/patient expects to be discharged to:: Private residence Living Arrangements: Spouse/significant other Available Help at Discharge: Family;Friend(s) Type of Home: House Home Access: Ramped entrance     Home Layout: One level Home Equipment: Environmental consultant - 2 wheels;Walker - 4 wheels;Cane - single point;Wheelchair - Engineer, technical sales - power;Electric scooter      Prior Function Level of Independence: Needs assistance   Gait / Transfers Assistance Needed: Pt's spouse reports pt was ambulating to chair to bathroom using SPC or RW, but due to recent decline is no longer doing that, uses electric wheelchair  ADL's / Homemaking Assistance Needed: Pt's spouse reports pt needs assistance with ADLs        Hand Dominance        Extremity/Trunk Assessment   Upper Extremity Assessment Upper Extremity Assessment: Generalized weakness    Lower Extremity Assessment Lower Extremity Assessment: Generalized weakness    Cervical / Trunk Assessment Cervical / Trunk Assessment: Kyphotic(Pt with very flexed trunk and forward head posture in all positions)  Communication   Communication: No difficulties;Other (comment)(Pt slow to respond, spouse in room verifying pt's answers)  Cognition Arousal/Alertness: Lethargic Behavior During Therapy: Agitated;Anxious Overall Cognitive Status: History of cognitive impairments - at baseline                                  General Comments: Pt anxious throughout treatment, withdrawing UE when attempting to check O2 with pulse ox, requires education to reduce fear/anxiety with transfer. Pt opens eyes with verbal cues, but returns to closed quickly after responding.      General Comments      Exercises     Assessment/Plan    PT Assessment Patient needs continued PT services  PT Problem List Decreased strength;Decreased activity tolerance;Decreased balance;Decreased mobility       PT Treatment Interventions Gait training;Functional mobility training;Therapeutic activities;Therapeutic exercise;Balance training;Patient/family education    PT Goals (Current goals can be found in the Care Plan section)  Acute Rehab PT Goals Patient Stated Goal: return home once stronger PT Goal Formulation: With patient/family Time For Goal Achievement: 09/02/18 Potential to Achieve Goals: Good    Frequency Min 3X/week   Barriers to discharge        Co-evaluation               AM-PAC PT "6 Clicks" Mobility  Outcome Measure Help needed turning from your back to your side while in a flat bed without using bedrails?: A Little Help needed moving from lying on your back to sitting on the side of a flat bed without using bedrails?: A Little Help needed moving to and from a bed to a chair (including a wheelchair)?: A Lot Help needed standing  up from a chair using your arms (e.g., wheelchair or bedside chair)?: A Lot Help needed to walk in hospital room?: A Lot Help needed climbing 3-5 steps with a railing? : Total 6 Click Score: 13    End of Session Equipment Utilized During Treatment: Gait belt;Oxygen(5 LPM) Activity Tolerance: Patient limited by fatigue;Patient limited by lethargy Patient left: in chair;with call bell/phone within reach;with family/visitor present(wife at bedside) Nurse Communication: Mobility status PT Visit Diagnosis: Unsteadiness on feet (R26.81);Other abnormalities of gait and  mobility (R26.89);Muscle weakness (generalized) (M62.81)    Time: 1025-1100 PT Time Calculation (min) (ACUTE ONLY): 35 min   Charges:   PT Evaluation $PT Eval Moderate Complexity: 1 Mod PT Treatments $Gait Training: 8-22 mins $Therapeutic Activity: 8-22 mins        11:33 AM, 08/19/18 Domenick Bookbinder, DPT Physical Therapist with [facility] [phone] office

## 2018-08-20 LAB — COMPREHENSIVE METABOLIC PANEL
ALT: 68 U/L — ABNORMAL HIGH (ref 0–44)
AST: 43 U/L — ABNORMAL HIGH (ref 15–41)
Albumin: 2.7 g/dL — ABNORMAL LOW (ref 3.5–5.0)
Alkaline Phosphatase: 262 U/L — ABNORMAL HIGH (ref 38–126)
Anion gap: 8 (ref 5–15)
BUN: 8 mg/dL (ref 8–23)
CO2: 20 mmol/L — ABNORMAL LOW (ref 22–32)
Calcium: 8.6 mg/dL — ABNORMAL LOW (ref 8.9–10.3)
Chloride: 104 mmol/L (ref 98–111)
Creatinine, Ser: 0.66 mg/dL (ref 0.61–1.24)
GFR calc Af Amer: >60 mL/min (ref >60)
GFR calc non Af Amer: >60 mL/min (ref >60)
Glucose, Bld: 154 mg/dL — ABNORMAL HIGH (ref 70–99)
POTASSIUM: 4.2 mmol/L (ref 3.5–5.1)
SODIUM: 132 mmol/L — AB (ref 135–145)
Total Bilirubin: 0.8 mg/dL (ref 0.3–1.2)
Total Protein: 7.3 g/dL (ref 6.5–8.1)

## 2018-08-20 LAB — GLUCOSE, CAPILLARY
Glucose-Capillary: 133 mg/dL — ABNORMAL HIGH (ref 70–99)
Glucose-Capillary: 216 mg/dL — ABNORMAL HIGH (ref 70–99)
Glucose-Capillary: 144 mg/dL — ABNORMAL HIGH (ref 70–99)
Glucose-Capillary: 186 mg/dL — ABNORMAL HIGH (ref 70–99)

## 2018-08-20 MED ORDER — SODIUM CHLORIDE 0.9 % IV SOLN
INTRAVENOUS | Status: DC | PRN
  Administered 2018-08-20: 500 mL via INTRAVENOUS

## 2018-08-20 MED ORDER — FLUCONAZOLE 200 MG PO TABS: 200.0000 mg | ORAL_TABLET | Freq: Every day | ORAL | 0 refills | Status: AC

## 2018-08-20 NOTE — Clinical Social Work Note (Signed)
Patient's wife is now agreeable for patient to admit to [facility] [address] [place] [address]) Room [address], however due to the late time of accepting the bed he cannot admit until 08/21/2018.    EMS transport medical necessity completed.  Patient to transport to facility morning of  08/21/2018.     Rickita Forstner, Juleen China, LCSW

## 2018-08-20 NOTE — Discharge Summary (Addendum)
Physician Discharge Summary  Jon [name] ZOX:[id number] DOB: 10-03-1945 DOA: 08/12/2018  PCP: Mechele Claude, MD  Admit date: 08/12/2018  Discharge date: 08/20/2018  Admitted From:Home  Disposition:  SNF--  Addendum---- Patient seen and evaluated, chart reviewed, please see EMR for updated orders. Please see full discharge summary dictated by physician Dr Sherryll Burger from 08/20/18----patient is appropriate for discharge to SNF rehab as of 08/21/2018 Plan of care discussed with patient and wife at bedside, both are agreeable to SNF rehab discharge  Recommendations for Outpatient Follow-up:  1. Follow up with PCP in 1-2 weeks 2. Follow-up with GI as recommended in 2 weeks for evaluation of elevated LFTs 3. Follow-up with general surgery outpatient for consideration of cholecystectomy in 4 weeks  Home Health: None  Equipment/Devices: None  Discharge Condition: Stable  CODE STATUS: Full  Diet recommendation: Heart Healthy/carb modified  Brief/Interim Summary: Per HPI: 73 y.o.malewith a past medical history significant for abdominal aortic aneurysm, dementia with behavioral disturbances, type 2 diabetes mellitus with hyperglycemia, hypertensionandhyperlipidemia;who presented to the hospital secondary to intractable nausea/vomiting, fever, inability to keep anything down and complaining of abdominal pain. Apparently symptoms has been present for the last week and a half and worsening. Food makes his symptoms worse and at this moment he is having trouble even keeping his medications down. Patient ended experiencing a fall at home prior to coming to the hospital due to generalized weakness associated with ongoing symptoms and dehydration. Per reports by family at bedside they have visited a different emergency department where work-up demonstrated concern for biliary abnormalities as probably 1 of the main reasons for his symptoms. At that time patient declines any further work-up and ended  going home. Symptom has continue progressing and given inability to keep anything down they came once again to the hospital for further evaluation and management.  In ED patient was found to have a fever of 102, elevated WBCs, signs of dehydration with hemoconcentrated samples and elevated hemoglobin level. Chest x-ray demonstrated bibasilar infiltrates and bronchitic changes. Patient actively throwing up in the ED. He had elevated LFTs and a bilirubin of 1.5. CT abdomen demonstrated cholelithiasis right upper quadrant ultrasound with cholelithiasis and gallbladder sludge. He was seen by general surgery with recommendations for conservative management and follow-up in the outpatient setting for consideration of elective cholecystectomy at that time.   HIDA scan within normal limits.  Patient was also found to be positive for influenza A. He has completed treatment with Tamiflu as well as with Unasyn over 7 days for associated pneumonia.  He is now overall feeling much better and continues to have elevated LFTs for which she will need outpatient GI follow-up.  An acute hepatitis panel as well as other liver function work-up remains within normal limits.  He is now tolerating diet with no further abdominal symptoms.  PT has evaluated patient with recommendations for inpatient rehabilitation due to severe weakness and diminished strength.  No other acute events noted during this inpatient stay.  Discharge Diagnoses:  Principal Problem:   Intractable nausea and vomiting Active Problems:   Uncontrolled type 2 diabetes mellitus with complication, with long-term current use of insulin (HCC)   HTN (hypertension)   AAA (abdominal aortic aneurysm) without rupture (HCC)   Hyperlipidemia   Lewy body dementia with behavioral disturbance (HCC)   Elevated LFTs   Calculus of gallbladder without cholecystitis without obstruction  Principal discharge diagnosis: Influenza A with associated pneumonia and  transaminitis.  Discharge Instructions   Allergies as of 08/20/2018  Reactions   Donepezil Nausea And Vomiting   Namenda [memantine Hcl] Other (See Comments)   Sleeps all the time      Medication List    STOP taking these medications   Insulin Glargine 100 UNIT/ML Solostar Pen Commonly known as:  LANTUS SOLOSTAR   promethazine 25 MG tablet Commonly known as:  PHENERGAN   TRULICITY 1.5 MG/0.5ML Sopn Generic drug:  Dulaglutide     TAKE these medications   aspirin 81 MG tablet Take 81 mg by mouth daily.   citalopram 10 MG tablet Commonly known as:  CELEXA Take 10 mg by mouth daily.   fluconazole 200 MG tablet Commonly known as:  DIFLUCAN Take 1 tablet (200 mg total) by mouth daily for 4 days. Start taking on:  August 21, 2018   glucose blood test strip Check fasting and 2 hours after biggest meal   lisinopril 5 MG tablet Commonly known as:  PRINIVIL,ZESTRIL Take 1 tablet (5 mg total) by mouth daily.   metFORMIN 1000 MG tablet Commonly known as:  GLUCOPHAGE Take 1 tablet (1,000 mg total) by mouth 2 (two) times daily with a meal.   omeprazole 20 MG capsule Commonly known as:  PRILOSEC TAKE 1 CAPSULE DAILY   ondansetron 8 MG disintegrating tablet Commonly known as:  ZOFRAN-ODT Take 1 tablet (8 mg total) by mouth every 6 (six) hours as needed for nausea or vomiting.   rivastigmine 4.5 MG capsule Commonly known as:  EXELON TAKE 1 CAPSULE TWICE A DAY   SURE COMFORT PEN NEEDLES 32G X 6 MM Misc Generic drug:  Insulin Pen Needle USE AS DIRECTED   TYLENOL ARTHRITIS EXT RELIEF PO Take by mouth.   Vitamin D 50 MCG (2000 UT) Caps Take 1 capsule by mouth daily.      Follow-up Information    Mechele Claude, MD Follow up in 1 week(s).   Specialty:  Family Medicine Contact information: [address] [place] [address] [phone]        Corbin Ade, MD Follow up in 2 week(s).   Specialty:  Gastroenterology Contact information: [address] [place] [address] [phone]          Allergies  Allergen Reactions  . Donepezil Nausea And Vomiting  . Namenda [Memantine Hcl] Other (See Comments)    Sleeps all the time    Consultations:  General surgery-Dr. Henreitta Leber   Procedures/Studies: Ct Abdomen Pelvis Wo Contrast  Result Date: 08/12/2018 CLINICAL DATA:  Generalize weakness. Vomiting and nausea over the last 2 weeks. Dementia. EXAM: CT ABDOMEN AND PELVIS WITHOUT CONTRAST TECHNIQUE: Multidetector CT imaging of the abdomen and pelvis was performed following the standard protocol without IV contrast. COMPARISON:  Ultrasound 08/07/2018 FINDINGS: Lower chest: Emphysema and pulmonary scarring. No sign of active infiltrate, mass, effusion or collapse in the region studied. Hepatobiliary: 1.6 cm gallstone dependent in the gallbladder. No focal liver parenchymal finding. Pancreas: Normal Spleen: Normal Adrenals/Urinary Tract: Adrenal glands are normal. Kidneys show a few low-density areas presumed to represent cysts. No evidence of stone, solid mass or obstruction. Bladder is moderately distended but otherwise unremarkable. Stomach/Bowel: No abnormal bowel finding. No evidence of obstruction. No inflammatory change. No evidence of bowel mass. Vascular/Lymphatic: Aortic atherosclerosis with infrarenal abdominal aortic aneurysm maximal transverse diameter 3.3 cm. IVC is normal. No retroperitoneal adenopathy. Reproductive: Normal Other: No free fluid or air. Musculoskeletal: Old healed compression deformities at L1 and L4. Ordinary lower lumbar facet degeneration and disc degeneration. IMPRESSION: No acute finding to explain  the clinical presentation. 1.6 cm calcified gallstone.  No CT evidence of cholecystitis. Moderate distended bladder. Aortic atherosclerosis with infrarenal abdominal aortic aneurysm maximal transverse diameter 3.3 cm. Recommend followup by ultrasound in 3 years. This recommendation follows ACR consensus guidelines:  White Paper of the ACR Incidental Findings Committee II on Vascular Findings. J Am Coll Radiol 2013; 10:789-794. Aortic aneurysm NOS (ICD10-I71.9) Electronically Signed   By: Paulina Fusi M.D.   On: 08/12/2018 10:07   Dg Chest 2 View  Result Date: 08/12/2018 CLINICAL DATA:  Abdominal pain.  Generalized weakness. EXAM: CHEST - 2 VIEW COMPARISON:  07/31/2016 insert CT 03/28/2016 FINDINGS: Two views of the chest demonstrate chronic lung changes compatible with emphysema and large blebs or bulla in the anterior chest. Patchy interstitial densities in both lungs related to chronic changes and probably associated with low lung volumes. Evidence for scarring in the right lower chest. Heart and mediastinum are within normal limits. Atherosclerotic calcifications at the aortic arch. Negative for a pneumothorax. No acute bone abnormality. IMPRESSION: Chronic lung changes compatible with emphysema. Prominent interstitial lung densities in both lungs particularly at the right lung base. Findings could be related to low lung volumes but difficult to exclude subtle infectious process. Electronically Signed   By: Richarda Overlie M.D.   On: 08/12/2018 10:10   Ct Head Wo Contrast  Result Date: 08/12/2018 CLINICAL DATA:  Generalize weakness. Nausea and vomiting over the last 2 weeks. Dementia. EXAM: CT HEAD WITHOUT CONTRAST TECHNIQUE: Contiguous axial images were obtained from the base of the skull through the vertex without intravenous contrast. COMPARISON:  06/25/2017 FINDINGS: Brain: Generalized atrophy. Chronic small-vessel ischemic changes throughout the cerebral hemispheric white matter. No sign of acute infarction, mass lesion, hemorrhage or extra-axial collection. Ventricular prominence is felt consistent with central atrophy. Normal pressure hydrocephalus not excluded but not favored. Vascular: There is atherosclerotic calcification of the major vessels at the base of the brain. Skull: Negative Sinuses/Orbits: Mild  chronic inflammatory changes of the right maxillary sinus and left sphenoid sinus. Orbits negative. Other: None IMPRESSION: Chronic brain atrophy. Advanced chronic small-vessel ischemic changes of the cerebral hemispheric white matter. Chronic ventriculomegaly presumed secondary to central atrophy. Normal pressure hydrocephalus not excluded but not favored on the basis of the imaging. Electronically Signed   By: Paulina Fusi M.D.   On: 08/12/2018 10:10   Nm Hepatobiliary Liver Func  Result Date: 08/15/2018 CLINICAL DATA:  Abdominal pain with nausea and vomiting question acute cholecystitis EXAM: NUCLEAR MEDICINE HEPATOBILIARY IMAGING TECHNIQUE: Sequential images of the abdomen were obtained out to 60 minutes following intravenous administration of radiopharmaceutical. RADIOPHARMACEUTICALS:  5.5 mCi Tc-59m  Choletec IV COMPARISON:  CT abdomen and pelvis 08/12/2018, ultrasound abdomen 08/12/2018 FINDINGS: Adequate clearance of tracer from bloodstream. Excretion of tracer into biliary tree with visualization of small bowel by 27 minutes period gallbladder visualized at 42 minutes period at 1 hour, moderate amount of retained tracer is seen within the liver at suggesting a component of hepatocellular dysfunction. IMPRESSION: Patent cystic duct. Patent CBD. Mild hepatocellular dysfunction. Electronically Signed   By: Ulyses Southward M.D.   On: 08/15/2018 12:08   US Abdomen Limited Ruq  Result Date: 08/12/2018 CLINICAL DATA:  Elevated LFTs with nausea and vomiting 2 weeks. EXAM: ULTRASOUND ABDOMEN LIMITED RIGHT UPPER QUADRANT COMPARISON:  CT today. FINDINGS: Gallbladder: Moderate gallbladder sludge with single shadowing mobile stone measuring 1.5 cm. No significant gallbladder wall thickening. Negative sonographic Murphy sign. No adjacent fluid. Common bile duct: Diameter: 3.0 mm. Liver: Heterogeneous parenchymal  echotexture without focal mass. Portal vein is patent on color Doppler imaging with normal direction of  blood flow towards the liver. Incidentally noted is a 2.5 cm right renal cyst. IMPRESSION: Moderate gallbladder sludge with mobile 1.5 cm gallstone. No additional sonographic evidence to suggest acute cholecystitis. Electronically Signed   By: Elberta Fortis M.D.   On: 08/12/2018 12:09     Discharge Exam: Vitals:   08/20/18 1340 08/20/18 1434  BP:  108/68  Pulse:  96  Resp:  20  Temp:  98.1 F (36.7 C)  SpO2: 96% 90%   Vitals:   08/20/18 0549 08/20/18 0700 08/20/18 1340 08/20/18 1434  BP: 136/89   108/68  Pulse: 82   96  Resp: 18   20  Temp: 98.4 F (36.9 C)   98.1 F (36.7 C)  TempSrc: Oral   Oral  SpO2: 97% 96% 96% 90%  Weight:      Height:        General: Pt is alert, awake, not in acute distress Cardiovascular: RRR, S1/S2 +, no rubs, no gallops Respiratory: CTA bilaterally, no wheezing, no rhonchi, on nasal cannula oxygen. Abdominal: Soft, NT, ND, bowel sounds + Extremities: no edema, no cyanosis    The results of significant diagnostics from this hospitalization (including imaging, microbiology, ancillary and laboratory) are listed below for reference.     Microbiology: Recent Results (from the past 240 hour(s))  Urine culture     Status: None   Collection Time: 08/12/18  9:08 AM  Result Value Ref Range Status   Specimen Description   Final    URINE, RANDOM Performed at [facility], [address] [place], [place] [address]    Special Requests   Final    NONE Performed at [facility], [address] [place], [place] [address]    Culture   Final    NO GROWTH Performed at [facility], [address] [place], [place] [address]    Report Status 08/13/2018 FINAL  Final  Group A Strep by PCR     Status: None   Collection Time: 08/12/18 11:41 AM  Result Value Ref Range Status   Group A Strep by PCR NOT DETECTED NOT DETECTED Final    Comment: Performed at [facility], [address] [place], [place] [address]  Culture, blood (routine x 2)      Status: None   Collection Time: 08/12/18  1:17 PM  Result Value Ref Range Status   Specimen Description BLOOD LEFT ANTECUBITAL  Final   Special Requests   Final    BOTTLES DRAWN AEROBIC AND ANAEROBIC Blood Culture adequate volume   Culture   Final    NO GROWTH 5 DAYS Performed at [facility], [address], [place], [place] [address]    Report Status 08/17/2018 FINAL  Final  Culture, blood (routine x 2)     Status: None   Collection Time: 08/12/18  5:43 PM  Result Value Ref Range Status   Specimen Description BLOOD LEFT ANTECUBITAL  Final   Special Requests   Final    BOTTLES DRAWN AEROBIC AND ANAEROBIC Blood Culture adequate volume   Culture   Final    NO GROWTH 5 DAYS Performed at [facility], [address], [place], [place] [address]    Report Status 08/17/2018 FINAL  Final     Labs: BNP (last 3 results) No results for input(s): BNP in the last 8760 hours. Basic Metabolic Panel: Recent Labs  Lab 08/16/18 0548  08/17/18 [id number] 08/18/18 0621 08/19/18 0540 08/20/18 0558  NA 133* 136 133* 136 132*  K 3.3* 2.9* 3.6 3.9 4.2  CL 103 106 107 108 104  CO2 21* 22 19* 19* 20*  GLUCOSE 138* 133* 137* 144* 154*  BUN 9 9 7* 6* 8  CREATININE 0.69 0.64 0.63 0.63 0.66  CALCIUM 8.1* 7.8* 7.9* 8.3* 8.6*  MG 2.1  --   --   --   --    Liver Function Tests: Recent Labs  Lab 08/16/18 0548 08/17/18 0638 08/18/18 0621 08/19/18 0540 08/20/18 0558  AST 70* 89* 87* 56* 43*  ALT 86* 93* 99* 79* 68*  ALKPHOS 257* 263* 270* 249* 262*  BILITOT 1.4* 1.1 1.0 0.9 0.8  PROT 6.4* 6.0* 6.3* 6.4* 7.3  ALBUMIN 2.7* 2.5* 2.5* 2.4* 2.7*   No results for input(s): LIPASE, AMYLASE in the last 168 hours. Recent Labs  Lab 08/15/18 1511  AMMONIA 30   CBC: Recent Labs  Lab 08/15/18 0659 08/16/18 0548 08/18/18 0621 08/19/18 0540  WBC 8.5 7.2 9.1 9.8  HGB 13.5 13.2 12.3* 12.8*  HCT 40.5 40.0 37.3* 37.9*  MCV 91.8 89.7 90.3 90.9  PLT 143* 169 198 222   Cardiac Enzymes: No results for  input(s): CKTOTAL, CKMB, CKMBINDEX, TROPONINI in the last 168 hours. BNP: Invalid input(s): POCBNP CBG: Recent Labs  Lab 08/19/18 1123 08/19/18 1640 08/19/18 2250 08/20/18 0734 08/20/18 1127  GLUCAP 145* 136* 126* 133* 216*   D-Dimer No results for input(s): DDIMER in the last 72 hours. Hgb A1c No results for input(s): HGBA1C in the last 72 hours. Lipid Profile No results for input(s): CHOL, HDL, LDLCALC, TRIG, CHOLHDL, LDLDIRECT in the last 72 hours. Thyroid function studies No results for input(s): TSH, T4TOTAL, T3FREE, THYROIDAB in the last 72 hours.  Invalid input(s): FREET3 Anemia work up No results for input(s): VITAMINB12, FOLATE, FERRITIN, TIBC, IRON, RETICCTPCT in the last 72 hours. Urinalysis    Component Value Date/Time   COLORURINE YELLOW 08/17/2018 1056   APPEARANCEUR CLEAR 08/17/2018 1056   APPEARANCEUR Clear 05/03/2018 1147   LABSPEC 1.015 08/17/2018 1056   PHURINE 7.0 08/17/2018 1056   GLUCOSEU >=500 (A) 08/17/2018 1056   HGBUR NEGATIVE 08/17/2018 1056   BILIRUBINUR NEGATIVE 08/17/2018 1056   BILIRUBINUR Negative 05/03/2018 1147   KETONESUR NEGATIVE 08/17/2018 1056   PROTEINUR NEGATIVE 08/17/2018 1056   NITRITE NEGATIVE 08/17/2018 1056   LEUKOCYTESUR NEGATIVE 08/17/2018 1056   Sepsis Labs Invalid input(s): PROCALCITONIN,  WBC,  LACTICIDVEN Microbiology Recent Results (from the past 240 hour(s))  Urine culture     Status: None   Collection Time: 08/12/18  9:08 AM  Result Value Ref Range Status   Specimen Description   Final    URINE, RANDOM Performed at [facility], [address] [place], [place] [address]    Special Requests   Final    NONE Performed at [facility], [address] [place], [place] [address]    Culture   Final    NO GROWTH Performed at [facility], [address] [place], [place] [address]    Report Status 08/13/2018 FINAL  Final  Group A Strep by PCR     Status: None   Collection Time: 08/12/18 11:41 AM   Result Value Ref Range Status   Group A Strep by PCR NOT DETECTED NOT DETECTED Final    Comment: Performed at [facility], [address] [place], [place] [address]  Culture, blood (routine x 2)  Status: None   Collection Time: 08/12/18  1:17 PM  Result Value Ref Range Status   Specimen Description BLOOD LEFT ANTECUBITAL  Final   Special Requests   Final    BOTTLES DRAWN AEROBIC AND ANAEROBIC Blood Culture adequate volume   Culture   Final    NO GROWTH 5 DAYS Performed at [facility], [address] [place], [place] [address]    Report Status 08/17/2018 FINAL  Final  Culture, blood (routine x 2)     Status: None   Collection Time: 08/12/18  5:43 PM  Result Value Ref Range Status   Specimen Description BLOOD LEFT ANTECUBITAL  Final   Special Requests   Final    BOTTLES DRAWN AEROBIC AND ANAEROBIC Blood Culture adequate volume   Culture   Final    NO GROWTH 5 DAYS Performed at [facility], [address] [place], [place] [address]    Report Status 08/17/2018 FINAL  Final     Time coordinating discharge: 35 minutes  SIGNED:   Erick BlinksPratik D Shah, DO [facility] 08/20/2018, 3:20 PM  If 7PM-7AM, please contact night-coverage [email or web address] Password [email or web address]  Addendum---- Patient seen and evaluated, chart reviewed, please see EMR for updated orders. Please see full discharge summary dictated by physician Dr Sherryll BurgerShah from 08/20/18----patient is appropriate for discharge to SNF rehab as of 08/21/2018 Plan of care discussed with patient and wife at bedside, both are agreeable to SNF rehab discharge

## 2018-08-20 NOTE — Clinical Social Work Note (Addendum)
Patient's referral has been manually faxed and sent through the Epic Hub to multiple facilities in [place].  The current bed offer is from the [facility] which Mrs. Manzano says is not agreeable to.  Mrs. Degenhardt requested that referrals be sent to two facilities in [place] despite indicating that she was not interested in [place] facilities on 08/19/18.  Referrals sent to two additional facilities in [place].  LCSW discussed that patient was discharged with Mrs. Alexa.    Referrals sent to  [facility] (cannot take due to Lewy body dementia),  [facility],  [facility] (declined due to Lewy body dementia),  [facility] (can take patient Thursday or Friday maybe),  [facility] (declined due to Lewy body dementia),  [facility], (accepted, however wife is not agreeable)   [facility],  [facility],  [facility],  [facility] (no bed).  Chrystie Hagwood, Juleen China, LCSW

## 2018-08-20 NOTE — Progress Notes (Signed)
Physical Therapy Treatment Patient Details Name: Jon [name] MRN: [id number] DOB: [birth year] Today's Date: 08/20/2018    History of Present Illness Jon [name] is a 73 y.o. male with a past medical history significant for abdominal aortic aneurysm, dementia with behavioral disturbances, type 2 diabetes mellitus with hyperglycemia, hypertension and hyperlipidemia; who presented to the hospital secondary to intractable nausea/vomiting, fever, inability to keep anything down and complaining of abdominal pain.  Apparently symptoms has been present for the last week and a half and worsening.  Food makes his symptoms worse and at this moment he is having trouble even keeping his medications down.  Patient ended experiencing a fall at home prior to coming to the hospital due to generalized weakness associated with ongoing symptoms and dehydration.    PT Comments    Patient presents supine in bed, more alert, continues with anxiety towards therapy, but agreeable to therapy. Pt tolerates seated bil LE strengthening exercises with demonstration and verbal/tactile cues for performance. Pt on 4 LPM O2 and O2 sat 94-92% with OOB mobility. Pt continues to be limited with gait training and requiring mod assist with gait and transfers secondary to fear of falling and poor understanding of task at hand. Pt continues with unsteadiness upon standing during transfers and gait and with tremors during mobility. Pt refuses to use RW and prefers hand held assist secondary to fear of falling. Pt requires mod assist once seated EOB to prevent loss of balance in all directions. Pt tolerates lower supplemental O2 and with O2 sat above 90% during treatment session. Pt left up in chair with bil LE elevated, call bell in reach and wife at bedside. Patient will benefit from continued physical therapy in hospital and recommended venue below to increase strength, balance, endurance for safe ADLs and gait.   Follow Up  Recommendations  SNF     Equipment Recommendations  None recommended by PT    Recommendations for Other Services       Precautions / Restrictions Precautions Precautions: Fall Restrictions Weight Bearing Restrictions: No    Mobility  Bed Mobility Overal bed mobility: Needs Assistance Bed Mobility: Supine to Sit     Supine to sit: Min assist;Mod assist     General bed mobility comments: increased time, verbal and tactile cues for sequencing, assistance for trunk uprighting and mod assist maintaining trunk upright once seated EOB  Transfers Overall transfer level: Needs assistance Equipment used: 1 person hand held assist Transfers: Sit to/from [employer] Sit to Stand: Mod assist Stand pivot transfers: Mod assist       General transfer comment: increased time, elevated bed, too anxious to use RW so bil hand held assist, unsteadiness upon standing, maintains forward head and flexed trunk posture  Ambulation/Gait Ambulation/Gait assistance: Mod assist Gait Distance (Feet): 4 Feet Assistive device: 1 person hand held assist Gait Pattern/deviations: Decreased step length - right;Decreased step length - left;Decreased stride length;Shuffle Gait velocity: decreased   General Gait Details: labored, unsteady 4-5 steps at bedisde with hand held assist, no loss of balance, fearful of falling   Stairs             Wheelchair Mobility    Modified Rankin (Stroke Patients Only)       Balance Overall balance assessment: Needs assistance Sitting-balance support: Feet supported;Bilateral upper extremity supported Sitting balance-Leahy Scale: Poor Sitting balance - Comments: seated edge of bed with posterior pelvic tilt and lateral leaning, mod assist to maintain trunk upright position   Standing  balance support: During functional activity;Bilateral upper extremity supported Standing balance-Leahy Scale: Poor Standing balance comment: with bil hand  held assist, limited in duration                            Cognition Arousal/Alertness: Lethargic Behavior During Therapy: Anxious Overall Cognitive Status: History of cognitive impairments - at baseline                                 General Comments: Pt anxious throughout treatment and requires education to reduce fear/anxiety with transfer. Pt opens eyes with verbal cues, but maintains bil eyes closed majority of the treatment.      Exercises General Exercises - Lower Extremity Long Arc Quad: Seated;AROM;Strengthening;Both;10 reps Hip Flexion/Marching: Seated;AROM;Strengthening;Both;10 reps Toe Raises: Seated;AROM;Strengthening;Both;10 reps Heel Raises: Seated;AROM;Strengthening;Both;10 reps    General Comments        Pertinent Vitals/Pain Pain Assessment: No/denies pain    Home Living                      Prior Function            PT Goals (current goals can now be found in the care plan section) Acute Rehab PT Goals Patient Stated Goal: return home once stronger PT Goal Formulation: With patient/family Time For Goal Achievement: 09/02/18 Potential to Achieve Goals: Good Progress towards PT goals: Progressing toward goals    Frequency    Min 3X/week      PT Plan Current plan remains appropriate    Co-evaluation              AM-PAC PT "6 Clicks" Mobility   Outcome Measure  Help needed turning from your back to your side while in a flat bed without using bedrails?: A Little Help needed moving from lying on your back to sitting on the side of a flat bed without using bedrails?: A Little Help needed moving to and from a bed to a chair (including a wheelchair)?: A Lot Help needed standing up from a chair using your arms (e.g., wheelchair or bedside chair)?: A Lot Help needed to walk in hospital room?: A Lot Help needed climbing 3-5 steps with a railing? : Total 6 Click Score: 13    End of Session Equipment  Utilized During Treatment: Gait belt;Oxygen(4 LPM) Activity Tolerance: Patient tolerated treatment well;Patient limited by fatigue Patient left: in chair;with call bell/phone within reach;with family/visitor present(wife at bedside) Nurse Communication: Mobility status PT Visit Diagnosis: Unsteadiness on feet (R26.81);Other abnormalities of gait and mobility (R26.89);Muscle weakness (generalized) (M62.81)     Time: 6606-3016 PT Time Calculation (min) (ACUTE ONLY): 27 min  Charges:  $Therapeutic Exercise: 8-22 mins $Therapeutic Activity: 8-22 mins                     12:59 PM, 08/20/18 Domenick Bookbinder, DPT Physical Therapist with [facility] [phone] office

## 2018-08-20 NOTE — Clinical Social Work Note (Signed)
Patient Information   Patient Name Jon [name], Jon [name] ([id number]) Sex Male DOB 1945/07/18 SSN [id number]  Room Bed  A333 A333-01  Patient Demographics   Address [address] [place] [address] Phone [phone] (Home) *Preferred* [phone] (Mobile) E-mail Address [email or web address]  Patient Ethnicity & Race   Ethnic Group Patient Race  Not Hispanic or Latino White or Caucasian  Emergency Contact(s)   Name Relation Home Work Mobile  Rotondo,Brenda Spouse [phone]  [phone]  Documents on File    Status Date Received Description  Documents for the Patient  Driver's License Not Received    [place] HIPAA NOTICE OF PRIVACY - Scanned Not Received    [place] E-Signature HIPAA Notice of Privacy Received 04/09/12   Insurance Card Received 04/09/12   Advance Directives/Living Will/HCPOA/POA Not Received    [place] E-Signature HIPAA Notice of Privacy Spanish Received 01/08/13   Advanced Beneficiary Notice (ABN) Not Received    Driver's License Received 02/06/13   Driver's License Not Received    Insurance Card Received 02/06/13   Insurance Card Received 02/06/13 [facility]/TRICARE/JB  Driver's License Received 02/06/13   Driver's License Received 02/06/13 [facility]/JB  Release of Information Received 02/06/13 [facility]/JB  HIM ROI Authorization Not Received    Release of Information Not Received    Release of Information Not Received    E-Signature AOB Spanish Not Received    Other Photo ID Not Received    AMB Outside Hospital Record  08/12/15 10/14-9/16 PROGRESS NOTE [facility]  AMB Outside Hospital Record  05/06/15 NEUROPSYCHOLOGICAL TESTING [facility]  AMB Outside Hospital Record  09/03/15 VISIT NOTE [facility]  AMB Outside Hospital Record  01/05/16 PROGRESS NOTES [facility] NEUROLOGY  AMB Outside Hospital Record  01/13/16 D/S  REPORT [facility]  HIM ROI Authorization  01/31/16 DR. EDWARD HAWKINS  AMB HH/NH/Hospice  01/15/16  ORDERS [facility]  AMB Correspondence  02/08/16 OFFICE NOTES [place] PA-C, M  AMB HH/NH/Hospice  01/07/16 CERTIFICATION/POC [facility]  AMB HH/NH/Hospice  02/07/16 ORDERS [facility]  AMB HH/NH/Hospice  02/23/16 ORDER [place] OFFICE  HIM ROI Authorization  04/04/16   AMB Correspondence  03/29/16 LETTER/TRICARE PHAR [facility]  AMB HH/NH/Hospice  02/23/16 ORDER [facility]  AMB Outside Hospital Record  04/06/16 OFFICE NOTE [facility]  AMB Outside Hospital Record  07/24/16 OFFICE VISIT NOTE [facility] NEUROLOGY & SLEEP  AMB HH/NH/Hospice  07/31/16 PATIENT RX HISTORY REPORT [facility]  HIM ROI Authorization  03/29/17   Release of Information  04/03/17   AMB HH/NH/Hospice  03/01/17 HOME HEALTH CERTIFICATION&POC [facility]  AMB HH/NH/Hospice Received 03/01/17 HH CERTIFICATION & POC [facility]  AMB HH/NH/Hospice Received 05/01/17 09/18-10/18 ORDERS [facility]  AMB Correspondence Received 04/19/17 [facility]  AMB Correspondence Received 05/11/17 [facility]  VVS Policy for Pain - E Signature     Insurance Card Received 04/08/18 MCR/[facility]  Insurance Card Received 04/08/18 Tricare/[facility]  AMB Provider Completed Forms Received 04/05/18 PROFESSIOANL COMMUNICATION [facility]  AMB Provider Completed Forms Received 04/09/18 PROFESSIONAL COMMUNICATION [facility]  [place] E-Signature HIPAA Notice of Privacy Signed 08/02/18   Insurance Card Not Received (Deleted)    AMB HH/NH/Hospice (Deleted) 08/14/16 PATIENT RX HISTORY REPORT [facility]  Documents for the Encounter  AOB (Assignment of Insurance Benefits) Not Received    E-signature AOB Signed 08/12/18   MEDICARE RIGHTS Not Received    E-signature Medicare Rights Signed 08/12/18   Ultrasound Received  08/12/18   EMS Run Sheet Received 08/12/18   Cardiac Monitoring Strip Received 08/13/18   ED Patient  Billing Extract   ED PB Billing Extract  EKG Received 08/13/18   Admission Information   Current Information   Attending Provider Admitting Provider Admission Type Admission Status  Erick Blinks, DO Vassie Loll, MD Emergency Admission (Confirmed)       Admission Date/Time Discharge Date Hospital Service Auth/Cert Status  08/12/18 08:49 AM  Internal Medicine Incomplete       Hospital Area Unit Room/Bed   [facility] AP-DEPT [phone]        Admission   Complaint  Weakness  Hospital Account   Name Acct ID Class Status Primary Coverage  Jon [name], Jon [name] [id number] Inpatient Open MEDICARE - MEDICARE PART A AND B      Guarantor Account (for Hospital Account [id number])   Name Relation to Pt Service Area Active? Acct Type  Jon [name] Self CHSA Yes Personal/Family  Address Phone    [address] [place], [place] [address] [phone](H)        Coverage Information (for Hospital Account [id number])   1. MEDICARE/MEDICARE PART A AND B   F/O Payor/Plan Precert #  MEDICARE/MEDICARE PART A AND B   Subscriber Subscriber #  Calev, Nava [id number]  Address Phone  [address] [place], [place] [address]   2. TRICARE/TRICARE FOR LIFE   F/O Payor/Plan Precert #  TRICARE/TRICARE FOR LIFE   Subscriber Subscriber #  Jager, [place] [id number]  Address Phone  [address] [place], [place] [address] [phone]       Care Everywhere ID:  [id number]

## 2018-08-21 DIAGNOSIS — K8020 Calculus of gallbladder without cholecystitis without obstruction: Secondary | ICD-10-CM | POA: Diagnosis present

## 2018-08-21 DIAGNOSIS — J09X1 Influenza due to identified novel influenza A virus with pneumonia: Secondary | ICD-10-CM | POA: Diagnosis present

## 2018-08-21 DIAGNOSIS — G3183 Dementia with Lewy bodies: Secondary | ICD-10-CM | POA: Diagnosis present

## 2018-08-21 DIAGNOSIS — E118 Type 2 diabetes mellitus with unspecified complications: Secondary | ICD-10-CM | POA: Diagnosis present

## 2018-08-21 DIAGNOSIS — M6281 Muscle weakness (generalized): Secondary | ICD-10-CM | POA: Diagnosis present

## 2018-08-21 DIAGNOSIS — Z7401 Bed confinement status: Secondary | ICD-10-CM | POA: Diagnosis not present

## 2018-08-21 DIAGNOSIS — R0902 Hypoxemia: Secondary | ICD-10-CM | POA: Diagnosis not present

## 2018-08-21 DIAGNOSIS — R112 Nausea with vomiting, unspecified: Secondary | ICD-10-CM | POA: Diagnosis not present

## 2018-08-21 LAB — COMPREHENSIVE METABOLIC PANEL
ALT: 59 U/L — ABNORMAL HIGH (ref 0–44)
AST: 37 U/L (ref 15–41)
Albumin: 2.9 g/dL — ABNORMAL LOW (ref 3.5–5.0)
Alkaline Phosphatase: 248 U/L — ABNORMAL HIGH (ref 38–126)
Anion gap: 9 (ref 5–15)
BUN: 12 mg/dL (ref 8–23)
CO2: 21 mmol/L — AB (ref 22–32)
Calcium: 8.8 mg/dL — ABNORMAL LOW (ref 8.9–10.3)
Chloride: 102 mmol/L (ref 98–111)
Creatinine, Ser: 0.76 mg/dL (ref 0.61–1.24)
GFR calc Af Amer: >60 mL/min (ref >60)
GFR calc non Af Amer: >60 mL/min (ref >60)
Glucose, Bld: 160 mg/dL — ABNORMAL HIGH (ref 70–99)
POTASSIUM: 4.3 mmol/L (ref 3.5–5.1)
Sodium: 132 mmol/L — ABNORMAL LOW (ref 135–145)
Total Bilirubin: 0.8 mg/dL (ref 0.3–1.2)
Total Protein: 7.5 g/dL (ref 6.5–8.1)

## 2018-08-21 LAB — GLUCOSE, CAPILLARY
Glucose-Capillary: 173 mg/dL — ABNORMAL HIGH (ref 70–99)
Glucose-Capillary: 179 mg/dL — ABNORMAL HIGH (ref 70–99)

## 2018-08-21 LAB — CBC
HEMATOCRIT: 43.9 % (ref 39.0–52.0)
HEMOGLOBIN: 14.5 g/dL (ref 13.0–17.0)
MCH: 30 pg (ref 26.0–34.0)
MCHC: 33 g/dL (ref 30.0–36.0)
MCV: 90.7 fL (ref 80.0–100.0)
Platelets: 320 10*3/uL (ref 150–400)
RBC: 4.84 MIL/uL (ref 4.22–5.81)
RDW: 13.4 % (ref 11.5–15.5)
WBC: 10.2 10*3/uL (ref 4.0–10.5)
nRBC: 0 % (ref 0.0–0.2)

## 2018-08-21 MED ORDER — PROCHLORPERAZINE EDISYLATE 10 MG/2ML IJ SOLN: 10.0000 mg | Freq: Four times a day (QID) | INTRAMUSCULAR | Status: DC | PRN

## 2018-08-21 NOTE — Progress Notes (Addendum)
Patient Demographics:    Jon [name], is a 73 y.o. male, DOB - 1945/10/14, DGU:[id number]  Admit date - 08/12/2018   Admitting Physician Jon Loll, MD  Outpatient Primary MD for the patient is Jon Claude, MD  LOS - 9   Chief Complaint  Patient presents with  . Weakness        Subjective:    Jon [name] today has no fevers, no emesis,  No chest pain, wife at bedside, questions answered, patient complains of generalized weakness,   Assessment  & Plan :    Principal Problem:   Intractable nausea and vomiting Active Problems:   Uncontrolled type 2 diabetes mellitus with complication, with long-term current use of insulin (HCC)   HTN (hypertension)   AAA (abdominal aortic aneurysm) without rupture (HCC)   Hyperlipidemia   Lewy body dementia with behavioral disturbance (HCC)   Elevated LFTs   Calculus of gallbladder without cholecystitis without obstruction  Brief Summary:- Addendum---- Patient was initially discharged on 08/20/2018 however was unable to leave facility for facility patient seen and evaluated, chart reviewed, please see EMR for updated orders. Please see full discharge summary dictated by physician Dr Jon [name] from 08/20/18----patient is appropriate for discharge to SNF rehab as of 08/21/2018 Plan of care discussed with patient and wife at bedside, both are agreeable to SNF rehab discharge    Plan:- 1-intractable nausea and vomiting-with transaminitis-stabilizing:Overall nausea vomiting mostly resolved, -Multifactorial: With concern for biliary colic/cholelithiasis versus influenzaAinfection and aspiration PNA. -LFTs are stable and are now downtrending.  -CT abdomen demonstrated a mobile gallstone 1.5 cm and also a sludge in his gallbladder ultrasound.  Consider outpatient evaluation by general surgery for possible elective cholecystectomy Zofran PRN nausea  vomiting  2-influenza A---much improved overall -Tamiflu treatment completed. -Continue supportive care and fluid resuscitation.  3-pneumonia: Appreciated bilateral infiltrates on x-ray,elevated WBCs, fever and productive cough. -Given history of ongoing intractable vomiting high concern for aspiration -Continue oxygen supplementation -Continue as needed DuoNeb -Completed Unasyn  4-uncontrolled type 2 diabetes mellitus-now improved, resume diabetic meds, A1c is 6.4 reflecting excellent diabetic control  5-hypertension---stable, continue antihypertensives  6-AAA without rupture -Transverse diameter 3.3 cm -Recommend follow-up by ultrasound in 3 years.  7-dementia with behavioral disturbances-- -Continue supportive care -Continue the use of Celexa and Exelon  Code Status : full  Family Communication:   Wife at bedside  Disposition Plan  : SNF      Lab Results  Component Value Date   PLT 320 08/21/2018    Inpatient Medications  Scheduled Meds: . citalopram  10 mg Oral Daily  . fluconazole  200 mg Oral Daily  . heparin  5,000 Units Subcutaneous Q8H  . insulin aspart  0-5 Units Subcutaneous QHS  . insulin aspart  0-9 Units Subcutaneous TID WC  . ipratropium-albuterol  3 mL Nebulization TID  . pantoprazole  40 mg Oral BID  . potassium chloride  40 mEq Oral BID  . rivastigmine  4.5 mg Oral BID   Continuous Infusions: . sodium chloride 500 mL (08/20/18 0618)   PRN Meds:.sodium chloride, acetaminophen **OR** acetaminophen, hydrALAZINE, ipratropium-albuterol, morphine injection, ondansetron (ZOFRAN) IV, prochlorperazine    Anti-infectives (From admission, onward)   Start     Dose/Rate Route Frequency Ordered Stop   08/21/18  0000  fluconazole (DIFLUCAN) 200 MG tablet     200 mg Oral Daily 08/20/18 1514 08/25/18 2359   08/18/18 1100  fluconazole (DIFLUCAN) tablet 200 mg     200 mg Oral Daily 08/18/18 1054     08/12/18 2200  oseltamivir (TAMIFLU) capsule 30 mg   Status:  Discontinued     30 mg Oral 2 times daily 08/12/18 1737 08/12/18 1805   08/12/18 2200  oseltamivir (TAMIFLU) capsule 75 mg     75 mg Oral 2 times daily 08/12/18 1805 08/17/18 0940   08/12/18 1330  Ampicillin-Sulbactam (UNASYN) 3 g in sodium chloride 0.9 % 100 mL IVPB  Status:  Discontinued     3 g 200 mL/hr over 30 Minutes Intravenous Every 8 hours 08/12/18 1313 08/19/18 0909        Objective:   Vitals:   08/20/18 2003 08/20/18 2218 08/21/18 0625 08/21/18 0753  BP:  125/74 120/71   Pulse: 73 88 75   Resp: 18 20 20    Temp:  97.8 F (36.6 C) (!) 97.4 F (36.3 C)   TempSrc:  Oral Oral   SpO2: (!) 85% 93% 92% 94%  Weight:      Height:        Wt Readings from Last 3 Encounters:  08/12/18 81 kg  05/03/18 81.1 kg  04/08/18 88.5 kg     Intake/Output Summary (Last 24 hours) at 08/21/2018 1225 Last data filed at 08/21/2018 0816 Gross per 24 hour  Intake 297.66 ml  Output -  Net 297.66 ml     Physical Exam    Gen:- Awake Alert,  In no apparent distress  HEENT:- [place].AT, No sclera icterus Nose- 2 L/min Neck-Supple Neck,No JVD,.  Lungs-improved air movement without wheezing  CV- S1, S2 normal, regular  Abd-  +ve B.Sounds, Abd Soft, No tenderness,    Extremity/Skin:- No  edema, pedal pulses present  Psych-affect is appropriate, oriented x3 Neuro-no new focal deficits, no tremors   Data Review:   Micro Results Recent Results (from the past 240 hour(s))  Urine culture     Status: None   Collection Time: 08/12/18  9:08 AM  Result Value Ref Range Status   Specimen Description   Final    URINE, RANDOM Performed at [facility], [address], [place], [place] [address]    Special Requests   Final    NONE Performed at [facility], [address] [place], [place] [address]    Culture   Final    NO GROWTH Performed at [facility], [address] [place], [place] [address]    Report Status 08/13/2018 FINAL  Final  Group A Strep by PCR     Status:  None   Collection Time: 08/12/18 11:41 AM  Result Value Ref Range Status   Group A Strep by PCR NOT DETECTED NOT DETECTED Final    Comment: Performed at [facility], [address] [place], [place] [address]  Culture, blood (routine x 2)     Status: None   Collection Time: 08/12/18  1:17 PM  Result Value Ref Range Status   Specimen Description BLOOD LEFT ANTECUBITAL  Final   Special Requests   Final    BOTTLES DRAWN AEROBIC AND ANAEROBIC Blood Culture adequate volume   Culture   Final    NO GROWTH 5 DAYS Performed at [facility], [address] [place], [place] [address]    Report Status 08/17/2018 FINAL  Final  Culture, blood (routine x  2)     Status: None   Collection Time: 08/12/18  5:43 PM  Result Value Ref Range Status   Specimen Description BLOOD LEFT ANTECUBITAL  Final   Special Requests   Final    BOTTLES DRAWN AEROBIC AND ANAEROBIC Blood Culture adequate volume   Culture   Final    NO GROWTH 5 DAYS Performed at [facility], [address] [place], [place] [address]    Report Status 08/17/2018 FINAL  Final    Radiology Reports Ct Abdomen Pelvis Wo Contrast  Result Date: 08/12/2018 CLINICAL DATA:  Generalize weakness. Vomiting and nausea over the last 2 weeks. Dementia. EXAM: CT ABDOMEN AND PELVIS WITHOUT CONTRAST TECHNIQUE: Multidetector CT imaging of the abdomen and pelvis was performed following the standard protocol without IV contrast. COMPARISON:  Ultrasound 08/07/2018 FINDINGS: Lower chest: Emphysema and pulmonary scarring. No sign of active infiltrate, mass, effusion or collapse in the region studied. Hepatobiliary: 1.6 cm gallstone dependent in the gallbladder. No focal liver parenchymal finding. Pancreas: Normal Spleen: Normal Adrenals/Urinary Tract: Adrenal glands are normal. Kidneys show a few low-density areas presumed to represent cysts. No evidence of stone, solid mass or obstruction. Bladder is moderately distended but otherwise unremarkable. Stomach/Bowel:  No abnormal bowel finding. No evidence of obstruction. No inflammatory change. No evidence of bowel mass. Vascular/Lymphatic: Aortic atherosclerosis with infrarenal abdominal aortic aneurysm maximal transverse diameter 3.3 cm. IVC is normal. No retroperitoneal adenopathy. Reproductive: Normal Other: No free fluid or air. Musculoskeletal: Old healed compression deformities at L1 and L4. Ordinary lower lumbar facet degeneration and disc degeneration. IMPRESSION: No acute finding to explain the clinical presentation. 1.6 cm calcified gallstone.  No CT evidence of cholecystitis. Moderate distended bladder. Aortic atherosclerosis with infrarenal abdominal aortic aneurysm maximal transverse diameter 3.3 cm. Recommend followup by ultrasound in 3 years. This recommendation follows ACR consensus guidelines: White Paper of the ACR Incidental Findings Committee II on Vascular Findings. J Am Coll Radiol 2013; 10:789-794. Aortic aneurysm NOS (ICD10-I71.9) Electronically Signed   By: Paulina FusiMark  Shogry M.D.   On: 08/12/2018 10:07   Dg Chest 2 View  Result Date: 08/12/2018 CLINICAL DATA:  Abdominal pain.  Generalized weakness. EXAM: CHEST - 2 VIEW COMPARISON:  07/31/2016 insert CT 03/28/2016 FINDINGS: Two views of the chest demonstrate chronic lung changes compatible with emphysema and large blebs or bulla in the anterior chest. Patchy interstitial densities in both lungs related to chronic changes and probably associated with low lung volumes. Evidence for scarring in the right lower chest. Heart and mediastinum are within normal limits. Atherosclerotic calcifications at the aortic arch. Negative for a pneumothorax. No acute bone abnormality. IMPRESSION: Chronic lung changes compatible with emphysema. Prominent interstitial lung densities in both lungs particularly at the right lung base. Findings could be related to low lung volumes but difficult to exclude subtle infectious process. Electronically Signed   By: Richarda OverlieAdam  Henn M.D.    On: 08/12/2018 10:10   Ct Head Wo Contrast  Result Date: 08/12/2018 CLINICAL DATA:  Generalize weakness. Nausea and vomiting over the last 2 weeks. Dementia. EXAM: CT HEAD WITHOUT CONTRAST TECHNIQUE: Contiguous axial images were obtained from the base of the skull through the vertex without intravenous contrast. COMPARISON:  06/25/2017 FINDINGS: Brain: Generalized atrophy. Chronic small-vessel ischemic changes throughout the cerebral hemispheric white matter. No sign of acute infarction, mass lesion, hemorrhage or extra-axial collection. Ventricular prominence is felt consistent with central atrophy. Normal pressure hydrocephalus not excluded but not favored. Vascular: There is atherosclerotic calcification of the major vessels at  the base of the brain. Skull: Negative Sinuses/Orbits: Mild chronic inflammatory changes of the right maxillary sinus and left sphenoid sinus. Orbits negative. Other: None IMPRESSION: Chronic brain atrophy. Advanced chronic small-vessel ischemic changes of the cerebral hemispheric white matter. Chronic ventriculomegaly presumed secondary to central atrophy. Normal pressure hydrocephalus not excluded but not favored on the basis of the imaging. Electronically Signed   By: Paulina Fusi M.D.   On: 08/12/2018 10:10   Nm Hepatobiliary Liver Func  Result Date: 08/15/2018 CLINICAL DATA:  Abdominal pain with nausea and vomiting question acute cholecystitis EXAM: NUCLEAR MEDICINE HEPATOBILIARY IMAGING TECHNIQUE: Sequential images of the abdomen were obtained out to 60 minutes following intravenous administration of radiopharmaceutical. RADIOPHARMACEUTICALS:  5.5 mCi Tc-59m  Choletec IV COMPARISON:  CT abdomen and pelvis 08/12/2018, ultrasound abdomen 08/12/2018 FINDINGS: Adequate clearance of tracer from bloodstream. Excretion of tracer into biliary tree with visualization of small bowel by 27 minutes period gallbladder visualized at 42 minutes period at 1 hour, moderate amount of retained  tracer is seen within the liver at suggesting a component of hepatocellular dysfunction. IMPRESSION: Patent cystic duct. Patent CBD. Mild hepatocellular dysfunction. Electronically Signed   By: Ulyses Southward M.D.   On: 08/15/2018 12:08   US Abdomen Limited Ruq  Result Date: 08/12/2018 CLINICAL DATA:  Elevated LFTs with nausea and vomiting 2 weeks. EXAM: ULTRASOUND ABDOMEN LIMITED RIGHT UPPER QUADRANT COMPARISON:  CT today. FINDINGS: Gallbladder: Moderate gallbladder sludge with single shadowing mobile stone measuring 1.5 cm. No significant gallbladder wall thickening. Negative sonographic Murphy sign. No adjacent fluid. Common bile duct: Diameter: 3.0 mm. Liver: Heterogeneous parenchymal echotexture without focal mass. Portal vein is patent on color Doppler imaging with normal direction of blood flow towards the liver. Incidentally noted is a 2.5 cm right renal cyst. IMPRESSION: Moderate gallbladder sludge with mobile 1.5 cm gallstone. No additional sonographic evidence to suggest acute cholecystitis. Electronically Signed   By: Elberta Fortis M.D.   On: 08/12/2018 12:09     CBC Recent Labs  Lab 08/15/18 0659 08/16/18 0548 08/18/18 0621 08/19/18 0540 08/21/18 0544  WBC 8.5 7.2 9.1 9.8 10.2  HGB 13.5 13.2 12.3* 12.8* 14.5  HCT 40.5 40.0 37.3* 37.9* 43.9  PLT 143* 169 198 222 320  MCV 91.8 89.7 90.3 90.9 90.7  MCH 30.6 29.6 29.8 30.7 30.0  MCHC 33.3 33.0 33.0 33.8 33.0  RDW 12.9 13.0 13.4 13.2 13.4    Chemistries  Recent Labs  Lab 08/16/18 0548 08/17/18 0638 08/18/18 0621 08/19/18 0540 08/20/18 0558 08/21/18 0544  NA 133* 136 133* 136 132* 132*  K 3.3* 2.9* 3.6 3.9 4.2 4.3  CL 103 106 107 108 104 102  CO2 21* 22 19* 19* 20* 21*  GLUCOSE 138* 133* 137* 144* 154* 160*  BUN 9 9 7* 6* 8 12  CREATININE 0.69 0.64 0.63 0.63 0.66 0.76  CALCIUM 8.1* 7.8* 7.9* 8.3* 8.6* 8.8*  MG 2.1  --   --   --   --   --   AST 70* 89* 87* 56* 43* 37  ALT 86* 93* 99* 79* 68* 59*  ALKPHOS 257* 263* 270*  249* 262* 248*  BILITOT 1.4* 1.1 1.0 0.9 0.8 0.8   ------------------------------------------------------------------------------------------------------------------ No results for input(s): CHOL, HDL, LDLCALC, TRIG, CHOLHDL, LDLDIRECT in the last 72 hours.  Lab Results  Component Value Date   HGBA1C 6.4 (H) 08/12/2018     Coagulation profile Recent Labs  Lab 08/15/18 1511  INR 1.07    No results for input(s):  DDIMER in the last 72 hours.  Cardiac Enzymes No results for input(s): CKMB, TROPONINI, MYOGLOBIN in the last 168 hours.  Invalid input(s): CK ------------------------------------------------------------------------------------------------------------------ No results found for: BNP  Brief Summary:- Addendum---- Patient was initially discharged on 08/20/2018 however was unable to leave facility for facility patient seen and evaluated, chart reviewed, please see EMR for updated orders. Please see fulldischarge summarydictated by physician Dr Jon [name] from 08/20/18----patient is appropriate for discharge to SNF rehab as of 08/21/2018 Plan of care discussed with patient and wife at bedside,both are agreeable to SNF rehab discharge  Shon Hale M.D on 08/21/2018 at 12:25 PM  Go to [email or web address] - for contact info  [facility] - Office  [phone]

## 2018-08-21 NOTE — Clinical Social Work Placement (Signed)
   CLINICAL SOCIAL WORK PLACEMENT  NOTE  Date:  08/21/2018  Patient Details  Name: Jon [name] MRN: [id number] Date of Birth: 1946-01-14  Clinical Social Work is seeking post-discharge placement for this patient at the Skilled  Nursing Facility level of care (*CSW will initial, date and re-position this form in  chart as items are completed):  Yes   Patient/family provided with [facility] Clinical Social Work Department's list of facilities offering this level of care within the geographic area requested by the patient (or if unable, by the patient's family).  Yes   Patient/family informed of their freedom to choose among providers that offer the needed level of care, that participate in Medicare, Medicaid or managed care program needed by the patient, have an available bed and are willing to accept the patient.  Yes   Patient/family informed of [facility]'s ownership interest in [facility] and [facility], as well as of the fact that they are under no obligation to receive care at these facilities.  PASRR submitted to EDS on 08/19/18     PASRR number received on 08/19/18     Existing PASRR number confirmed on       FL2 transmitted to all facilities in geographic area requested by pt/family on 08/19/18     FL2 transmitted to all facilities within larger geographic area on       Patient informed that his/her managed care company has contracts with or will negotiate with certain facilities, including the following:        Yes   Patient/family informed of bed offers received.  Patient chooses bed at Other - please specify in the comment section below:([facility])     Physician recommends and patient chooses bed at      Patient to be transferred to Other - please specify in the comment section below:([facility] on 08/21/18.  Patient to be transferred to facility by [facility]     Patient family notified on 08/21/18 of transfer.  Name of family member  notified:  Mrs. Ohlinger     PHYSICIAN       Additional Comment:  Discharge clinicals sent to facility. RN to call Report and EMS. LCSW signing off.   _______________________________________________ Annice Needy, LCSW 08/21/2018, 9:55 AM

## 2018-08-21 NOTE — Care Management Important Message (Signed)
Important Message  Patient Details  Name: Jon [name] MRN: [id number] Date of Birth: 10/01/1945   Medicare Important Message Given:  Yes    Malcolm Metro, RN 08/21/2018, 9:24 AM

## 2018-08-22 NOTE — Clinical Social Work Note (Signed)
LCSW received a call from [facility]) who indicated that patient admitted on 08/21/2018  and that within a couple of hours patient's wife had requested patient be discharged. Amber indicated that staff put patient in spouses car and she left the facility.     Brooklyne Radke, Juleen China, LCSW

## 2018-08-23 ENCOUNTER — Encounter: Payer: Self-pay | Admitting: Physician Assistant

## 2018-08-23 ENCOUNTER — Ambulatory Visit (INDEPENDENT_AMBULATORY_CARE_PROVIDER_SITE_OTHER): Payer: Medicare Other | Admitting: Physician Assistant

## 2018-08-23 ENCOUNTER — Telehealth: Payer: Self-pay | Admitting: Family Medicine

## 2018-08-23 VITALS — BP 108/75 | HR 114 | Temp 96.8°F | Ht 70.0 in

## 2018-08-23 DIAGNOSIS — G3183 Dementia with Lewy bodies: Secondary | ICD-10-CM | POA: Diagnosis not present

## 2018-08-23 DIAGNOSIS — Z87898 Personal history of other specified conditions: Secondary | ICD-10-CM

## 2018-08-23 DIAGNOSIS — J439 Emphysema, unspecified: Secondary | ICD-10-CM

## 2018-08-23 DIAGNOSIS — F0281 Dementia in other diseases classified elsewhere with behavioral disturbance: Secondary | ICD-10-CM | POA: Diagnosis not present

## 2018-08-23 DIAGNOSIS — R0902 Hypoxemia: Secondary | ICD-10-CM | POA: Insufficient documentation

## 2018-08-23 NOTE — Telephone Encounter (Signed)
Wife states that patient now has a home health referral in place and aware to go through Home health for hospital bed and oxygen

## 2018-08-23 NOTE — Telephone Encounter (Signed)
See if they have home health consult from hospital. If so, go through them. If not, if they are willing, order home health and include those in the referral

## 2018-08-23 NOTE — Progress Notes (Signed)
BP 108/75   Pulse (!) 114   Temp (!) 96.8 F (36 C) (Oral)   Ht 5\' 10"  (1.778 m)   SpO2 (!) 88% Comment: resting room air  BMI 25.62 kg/m    Subjective:    Patient ID: Jon [name], male    DOB: Mar 30, 1946, 73 y.o.   MRN: [id number]  HPI: Jon [name] is a 73 y.o. male presenting on 08/23/2018 for Hospitalization Follow-up (needs hospital bed and oxygen )  This is a face to face encounter for home health.  This is a hospital follow-up.  He was admitted on 1 August 20, 2018.  HE was admitted for intractable nausea and vomiting.  He was at [facility], his discharge diagnoses included: Influenza A with associated pneumonia and transaminitis he was discharged and sent to a rehab center that was in [place].  The family found it to be extremely dirty and they were not comfortable with him staying there.  So therefore he has come home.  There needs to be some help for Jon [name].  Home health referral will be placed and asking for oxygen placement and hospital bed.  He has bullous emphysema, history of unsteady gait, hypoxia, Lewy body dementia.  We are trying to get home health to place oxygen for him and hospital bed for his positioning and safety.    Past Medical History:  Diagnosis Date  . AAA (abdominal aortic aneurysm) (HCC)   . Alzheimer disease (HCC)   . Anemia   . Ataxia   . Diabetes mellitus without complication (HCC)   . History of gallstones   . Hyperlipidemia   . Hypertension   . Vitamin D deficiency    Relevant past medical, surgical, family and social history reviewed and updated as indicated. Interim medical history since our last visit reviewed. Allergies and medications reviewed and updated. DATA REVIEWED: CHART IN EPIC  Family History reviewed for pertinent findings.  Review of Systems  Constitutional: Positive for fatigue. Negative for appetite change, diaphoresis and fever.  HENT: Negative.   Eyes: Negative.  Negative for pain and visual disturbance.   Respiratory: Positive for cough and wheezing. Negative for chest tightness and shortness of breath.   Cardiovascular: Negative.  Negative for chest pain, palpitations and leg swelling.  Gastrointestinal: Negative.  Negative for abdominal pain, diarrhea, nausea and vomiting.  Endocrine: Negative.   Genitourinary: Negative.   Musculoskeletal: Positive for arthralgias, back pain and gait problem.  Skin: Negative.  Negative for color change and rash.  Neurological: Positive for weakness. Negative for numbness and headaches.  Psychiatric/Behavioral: Positive for confusion.    Allergies as of 08/23/2018      Reactions   Donepezil Nausea And Vomiting   Namenda [memantine Hcl] Other (See Comments)   Sleeps all the time   Phenergan [promethazine Hcl] Other (See Comments)   Aggressive       Medication List       Accurate as of August 23, 2018  2:40 PM. Always use your most recent med list.        aspirin 81 MG tablet Take 81 mg by mouth daily.   citalopram 10 MG tablet Commonly known as:  CELEXA Take 10 mg by mouth daily.   fluconazole 200 MG tablet Commonly known as:  DIFLUCAN Take 1 tablet (200 mg total) by mouth daily for 4 days.   glucose blood test strip Check fasting and 2 hours after biggest meal   lisinopril 5 MG tablet Commonly  known as:  PRINIVIL,ZESTRIL Take 1 tablet (5 mg total) by mouth daily.   metFORMIN 1000 MG tablet Commonly known as:  GLUCOPHAGE Take 1 tablet (1,000 mg total) by mouth 2 (two) times daily with a meal.   omeprazole 20 MG capsule Commonly known as:  PRILOSEC TAKE 1 CAPSULE DAILY   ondansetron 8 MG disintegrating tablet Commonly known as:  ZOFRAN-ODT Take 1 tablet (8 mg total) by mouth every 6 (six) hours as needed for nausea or vomiting.   rivastigmine 4.5 MG capsule Commonly known as:  EXELON TAKE 1 CAPSULE TWICE A DAY   SURE COMFORT PEN NEEDLES 32G X 6 MM Misc Generic drug:  Insulin Pen Needle USE AS DIRECTED   TYLENOL  ARTHRITIS EXT RELIEF PO Take by mouth.   Vitamin D 50 MCG (2000 UT) Caps Take 1 capsule by mouth daily.          Objective:    BP 108/75   Pulse (!) 114   Temp (!) 96.8 F (36 C) (Oral)   Ht 5\' 10"  (1.778 m)   SpO2 (!) 88% Comment: resting room air  BMI 25.62 kg/m   Allergies  Allergen Reactions  . Donepezil Nausea And Vomiting  . Namenda [Memantine Hcl] Other (See Comments)    Sleeps all the time  . Phenergan [Promethazine Hcl] Other (See Comments)    Aggressive      Wt Readings from Last 3 Encounters:  08/12/18 178 lb 9.2 oz (81 kg)  05/03/18 178 lb 12.8 oz (81.1 kg)  04/08/18 195 lb (88.5 kg)    Physical Exam Constitutional:      Appearance: He is well-developed. He is ill-appearing. He is not diaphoretic.  HENT:     Head: Normocephalic and atraumatic.  Eyes:     Conjunctiva/sclera: Conjunctivae normal.     Pupils: Pupils are equal, round, and reactive to light.  Neck:     Musculoskeletal: Normal range of motion and neck supple.  Cardiovascular:     Rate and Rhythm: Normal rate and regular rhythm.     Heart sounds: Normal heart sounds.  Pulmonary:     Effort: Pulmonary effort is normal.     Breath sounds: Wheezing present.  Chest:     Chest wall: No tenderness.  Abdominal:     General: Bowel sounds are normal.     Palpations: Abdomen is soft.  Musculoskeletal: Normal range of motion.  Skin:    General: Skin is warm and dry.  Neurological:     Motor: Weakness present.     Gait: Gait abnormal.         Assessment & Plan:   1. Bullous emphysema (HCC) Continue medications Order oxygen for his hypoxia  2. History of unsteady gait Hospital bed  3. Hypoxia Order oxygen referral  4. Lewy body dementia with behavioral disturbance (HCC) Needs PT/OT and caregiving   Continue all other maintenance medications as listed above.  Follow up plan: No follow-ups on file.  Educational handout given for survey  Remus Loffler PA-C [facility] [address]  [place], [place] [address] [phone]   08/23/2018, 2:40 PM

## 2018-08-26 ENCOUNTER — Telehealth: Payer: Self-pay | Admitting: Family Medicine

## 2018-08-26 DIAGNOSIS — Z049 Encounter for examination and observation for unspecified reason: Secondary | ICD-10-CM | POA: Diagnosis not present

## 2018-08-26 DIAGNOSIS — K219 Gastro-esophageal reflux disease without esophagitis: Secondary | ICD-10-CM | POA: Diagnosis not present

## 2018-08-26 DIAGNOSIS — E871 Hypo-osmolality and hyponatremia: Secondary | ICD-10-CM | POA: Diagnosis not present

## 2018-08-26 DIAGNOSIS — Z8709 Personal history of other diseases of the respiratory system: Secondary | ICD-10-CM | POA: Diagnosis not present

## 2018-08-26 DIAGNOSIS — Z794 Long term (current) use of insulin: Secondary | ICD-10-CM | POA: Diagnosis not present

## 2018-08-26 DIAGNOSIS — Z8679 Personal history of other diseases of the circulatory system: Secondary | ICD-10-CM | POA: Diagnosis not present

## 2018-08-26 DIAGNOSIS — R112 Nausea with vomiting, unspecified: Secondary | ICD-10-CM | POA: Diagnosis not present

## 2018-08-26 DIAGNOSIS — R05 Cough: Secondary | ICD-10-CM | POA: Diagnosis not present

## 2018-08-26 DIAGNOSIS — Z8639 Personal history of other endocrine, nutritional and metabolic disease: Secondary | ICD-10-CM | POA: Diagnosis not present

## 2018-08-26 DIAGNOSIS — R410 Disorientation, unspecified: Secondary | ICD-10-CM | POA: Diagnosis not present

## 2018-08-26 DIAGNOSIS — J449 Chronic obstructive pulmonary disease, unspecified: Secondary | ICD-10-CM | POA: Diagnosis not present

## 2018-08-26 DIAGNOSIS — R509 Fever, unspecified: Secondary | ICD-10-CM | POA: Diagnosis not present

## 2018-08-26 DIAGNOSIS — Z789 Other specified health status: Secondary | ICD-10-CM | POA: Insufficient documentation

## 2018-08-26 DIAGNOSIS — E119 Type 2 diabetes mellitus without complications: Secondary | ICD-10-CM | POA: Diagnosis not present

## 2018-08-26 DIAGNOSIS — E785 Hyperlipidemia, unspecified: Secondary | ICD-10-CM | POA: Diagnosis not present

## 2018-08-26 DIAGNOSIS — K311 Adult hypertrophic pyloric stenosis: Secondary | ICD-10-CM | POA: Diagnosis not present

## 2018-08-26 DIAGNOSIS — K920 Hematemesis: Secondary | ICD-10-CM | POA: Diagnosis not present

## 2018-08-26 DIAGNOSIS — Z7409 Other reduced mobility: Secondary | ICD-10-CM | POA: Diagnosis not present

## 2018-08-26 DIAGNOSIS — F0390 Unspecified dementia without behavioral disturbance: Secondary | ICD-10-CM | POA: Diagnosis not present

## 2018-08-26 NOTE — Telephone Encounter (Signed)
Can we put him with the acute person? If not, send him back to E.D.

## 2018-08-26 NOTE — Telephone Encounter (Signed)
Returned [employer].  Wife states that patient has Nausea, vomiting and not eating and not drinking plenty of fluids with little to no relief with Zofran.

## 2018-08-26 NOTE — Telephone Encounter (Signed)
No openings with Acute person.  Advised to go to the ED.  Wife verbalized understanding

## 2018-08-27 DIAGNOSIS — J449 Chronic obstructive pulmonary disease, unspecified: Secondary | ICD-10-CM | POA: Diagnosis not present

## 2018-08-27 DIAGNOSIS — R0902 Hypoxemia: Secondary | ICD-10-CM | POA: Diagnosis not present

## 2018-08-27 DIAGNOSIS — I714 Abdominal aortic aneurysm, without rupture: Secondary | ICD-10-CM | POA: Diagnosis not present

## 2018-08-27 DIAGNOSIS — I2510 Atherosclerotic heart disease of native coronary artery without angina pectoris: Secondary | ICD-10-CM | POA: Diagnosis present

## 2018-08-27 DIAGNOSIS — Z794 Long term (current) use of insulin: Secondary | ICD-10-CM | POA: Diagnosis not present

## 2018-08-27 DIAGNOSIS — R638 Other symptoms and signs concerning food and fluid intake: Secondary | ICD-10-CM | POA: Diagnosis not present

## 2018-08-27 DIAGNOSIS — K219 Gastro-esophageal reflux disease without esophagitis: Secondary | ICD-10-CM | POA: Diagnosis present

## 2018-08-27 DIAGNOSIS — E785 Hyperlipidemia, unspecified: Secondary | ICD-10-CM | POA: Diagnosis present

## 2018-08-27 DIAGNOSIS — E119 Type 2 diabetes mellitus without complications: Secondary | ICD-10-CM | POA: Diagnosis not present

## 2018-08-27 DIAGNOSIS — Z8679 Personal history of other diseases of the circulatory system: Secondary | ICD-10-CM | POA: Diagnosis not present

## 2018-08-27 DIAGNOSIS — Z9889 Other specified postprocedural states: Secondary | ICD-10-CM | POA: Diagnosis not present

## 2018-08-27 DIAGNOSIS — E871 Hypo-osmolality and hyponatremia: Secondary | ICD-10-CM | POA: Insufficient documentation

## 2018-08-27 DIAGNOSIS — Z7951 Long term (current) use of inhaled steroids: Secondary | ICD-10-CM | POA: Diagnosis not present

## 2018-08-27 DIAGNOSIS — Z7409 Other reduced mobility: Secondary | ICD-10-CM | POA: Diagnosis not present

## 2018-08-27 DIAGNOSIS — I10 Essential (primary) hypertension: Secondary | ICD-10-CM | POA: Diagnosis present

## 2018-08-27 DIAGNOSIS — R740 Nonspecific elevation of levels of transaminase and lactic acid dehydrogenase [LDH]: Secondary | ICD-10-CM | POA: Diagnosis not present

## 2018-08-27 DIAGNOSIS — E1100 Type 2 diabetes mellitus with hyperosmolarity without nonketotic hyperglycemic-hyperosmolar coma (NKHHC): Secondary | ICD-10-CM | POA: Diagnosis not present

## 2018-08-27 DIAGNOSIS — I70203 Unspecified atherosclerosis of native arteries of extremities, bilateral legs: Secondary | ICD-10-CM | POA: Diagnosis not present

## 2018-08-27 DIAGNOSIS — K311 Adult hypertrophic pyloric stenosis: Secondary | ICD-10-CM | POA: Diagnosis present

## 2018-08-27 DIAGNOSIS — R404 Transient alteration of awareness: Secondary | ICD-10-CM | POA: Diagnosis not present

## 2018-08-27 DIAGNOSIS — R7989 Other specified abnormal findings of blood chemistry: Secondary | ICD-10-CM | POA: Diagnosis not present

## 2018-08-27 DIAGNOSIS — R9431 Abnormal electrocardiogram [ECG] [EKG]: Secondary | ICD-10-CM | POA: Diagnosis not present

## 2018-08-27 DIAGNOSIS — Z7982 Long term (current) use of aspirin: Secondary | ICD-10-CM | POA: Diagnosis not present

## 2018-08-27 DIAGNOSIS — Z87891 Personal history of nicotine dependence: Secondary | ICD-10-CM | POA: Diagnosis not present

## 2018-08-27 DIAGNOSIS — R269 Unspecified abnormalities of gait and mobility: Secondary | ICD-10-CM | POA: Diagnosis not present

## 2018-08-27 DIAGNOSIS — Z9049 Acquired absence of other specified parts of digestive tract: Secondary | ICD-10-CM | POA: Diagnosis not present

## 2018-08-27 DIAGNOSIS — E1143 Type 2 diabetes mellitus with diabetic autonomic (poly)neuropathy: Secondary | ICD-10-CM | POA: Diagnosis present

## 2018-08-27 DIAGNOSIS — R410 Disorientation, unspecified: Secondary | ICD-10-CM | POA: Diagnosis not present

## 2018-08-27 DIAGNOSIS — Z9981 Dependence on supplemental oxygen: Secondary | ICD-10-CM | POA: Diagnosis not present

## 2018-08-27 DIAGNOSIS — R112 Nausea with vomiting, unspecified: Secondary | ICD-10-CM | POA: Diagnosis not present

## 2018-08-27 DIAGNOSIS — R1115 Cyclical vomiting syndrome unrelated to migraine: Secondary | ICD-10-CM | POA: Diagnosis not present

## 2018-08-27 DIAGNOSIS — Z743 Need for continuous supervision: Secondary | ICD-10-CM | POA: Diagnosis not present

## 2018-08-27 DIAGNOSIS — G4733 Obstructive sleep apnea (adult) (pediatric): Secondary | ICD-10-CM | POA: Diagnosis present

## 2018-08-27 DIAGNOSIS — R634 Abnormal weight loss: Secondary | ICD-10-CM | POA: Diagnosis not present

## 2018-08-27 DIAGNOSIS — Z79899 Other long term (current) drug therapy: Secondary | ICD-10-CM | POA: Diagnosis not present

## 2018-08-27 DIAGNOSIS — K3184 Gastroparesis: Secondary | ICD-10-CM | POA: Diagnosis present

## 2018-08-27 DIAGNOSIS — F0390 Unspecified dementia without behavioral disturbance: Secondary | ICD-10-CM | POA: Diagnosis present

## 2018-08-27 DIAGNOSIS — R279 Unspecified lack of coordination: Secondary | ICD-10-CM | POA: Diagnosis not present

## 2018-08-27 MED ORDER — ENOXAPARIN SODIUM 40 MG/0.4ML ~~LOC~~ SOLN
40.00 | SUBCUTANEOUS | Status: DC
Start: 2018-08-28 — End: 2018-08-27

## 2018-08-27 MED ORDER — RIVASTIGMINE TARTRATE 1.5 MG PO CAPS
3.00 | ORAL_CAPSULE | ORAL | Status: DC
Start: 2018-08-27 — End: 2018-08-27

## 2018-08-27 MED ORDER — DEXTROSE 10 % IV SOLN
125.00 | INTRAVENOUS | Status: DC
Start: ? — End: 2018-08-27

## 2018-08-27 MED ORDER — INSULIN LISPRO 100 UNIT/ML ~~LOC~~ SOLN
2.00 | SUBCUTANEOUS | Status: DC
Start: 2018-08-27 — End: 2018-08-27

## 2018-08-27 MED ORDER — ASPIRIN EC 81 MG PO TBEC
81.00 | DELAYED_RELEASE_TABLET | ORAL | Status: DC
Start: 2018-08-28 — End: 2018-08-27

## 2018-08-27 MED ORDER — DEXTROSE-NACL 5-0.9 % IV SOLN
INTRAVENOUS | Status: DC
Start: ? — End: 2018-08-27

## 2018-08-27 MED ORDER — METOCLOPRAMIDE HCL 5 MG/ML IJ SOLN
10.00 | INTRAMUSCULAR | Status: DC
Start: 2018-08-27 — End: 2018-08-27

## 2018-08-27 MED ORDER — ALBUTEROL SULFATE (2.5 MG/3ML) 0.083% IN NEBU
2.50 | INHALATION_SOLUTION | RESPIRATORY_TRACT | Status: DC
Start: ? — End: 2018-08-27

## 2018-08-27 MED ORDER — PANTOPRAZOLE SODIUM 40 MG PO TBEC
40.00 | DELAYED_RELEASE_TABLET | ORAL | Status: DC
Start: 2018-08-28 — End: 2018-08-27

## 2018-08-27 MED ORDER — ONDANSETRON 4 MG PO TBDP
4.00 | ORAL_TABLET | ORAL | Status: DC
Start: ? — End: 2018-08-27

## 2018-08-27 MED ORDER — LISINOPRIL 5 MG PO TABS
5.00 | ORAL_TABLET | ORAL | Status: DC
Start: 2018-08-28 — End: 2018-08-27

## 2018-08-27 MED ORDER — PNEUMOCOCCAL VAC POLYVALENT 25 MCG/0.5ML IJ INJ
0.50 | INJECTION | INTRAMUSCULAR | Status: DC
Start: ? — End: 2018-08-27

## 2018-08-27 MED ORDER — SERTRALINE HCL 25 MG PO TABS
25.00 | ORAL_TABLET | ORAL | Status: DC
Start: 2018-08-28 — End: 2018-08-27

## 2018-08-27 MED ORDER — ACETAMINOPHEN 325 MG PO TABS
650.00 | ORAL_TABLET | ORAL | Status: DC
Start: ? — End: 2018-08-27

## 2018-08-28 ENCOUNTER — Other Ambulatory Visit: Payer: Self-pay | Admitting: Family Medicine

## 2018-08-28 DIAGNOSIS — R748 Abnormal levels of other serum enzymes: Secondary | ICD-10-CM | POA: Insufficient documentation

## 2018-08-30 DIAGNOSIS — R1115 Cyclical vomiting syndrome unrelated to migraine: Secondary | ICD-10-CM | POA: Diagnosis not present

## 2018-08-30 DIAGNOSIS — E119 Type 2 diabetes mellitus without complications: Secondary | ICD-10-CM | POA: Diagnosis not present

## 2018-08-30 DIAGNOSIS — Z794 Long term (current) use of insulin: Secondary | ICD-10-CM | POA: Diagnosis not present

## 2018-08-30 DIAGNOSIS — J438 Other emphysema: Secondary | ICD-10-CM | POA: Diagnosis not present

## 2018-08-30 DIAGNOSIS — E1100 Type 2 diabetes mellitus with hyperosmolarity without nonketotic hyperglycemic-hyperosmolar coma (NKHHC): Secondary | ICD-10-CM | POA: Diagnosis not present

## 2018-08-30 DIAGNOSIS — I70203 Unspecified atherosclerosis of native arteries of extremities, bilateral legs: Secondary | ICD-10-CM | POA: Diagnosis not present

## 2018-08-30 DIAGNOSIS — J431 Panlobular emphysema: Secondary | ICD-10-CM | POA: Diagnosis not present

## 2018-08-30 DIAGNOSIS — F0390 Unspecified dementia without behavioral disturbance: Secondary | ICD-10-CM | POA: Diagnosis not present

## 2018-08-30 DIAGNOSIS — J449 Chronic obstructive pulmonary disease, unspecified: Secondary | ICD-10-CM | POA: Diagnosis not present

## 2018-08-30 DIAGNOSIS — J8489 Other specified interstitial pulmonary diseases: Secondary | ICD-10-CM | POA: Diagnosis not present

## 2018-08-30 DIAGNOSIS — R269 Unspecified abnormalities of gait and mobility: Secondary | ICD-10-CM | POA: Diagnosis not present

## 2018-08-30 DIAGNOSIS — R279 Unspecified lack of coordination: Secondary | ICD-10-CM | POA: Diagnosis not present

## 2018-08-30 DIAGNOSIS — R740 Nonspecific elevation of levels of transaminase and lactic acid dehydrogenase [LDH]: Secondary | ICD-10-CM | POA: Diagnosis not present

## 2018-08-30 DIAGNOSIS — F0150 Vascular dementia without behavioral disturbance: Secondary | ICD-10-CM | POA: Diagnosis not present

## 2018-08-30 DIAGNOSIS — R945 Abnormal results of liver function studies: Secondary | ICD-10-CM | POA: Diagnosis not present

## 2018-08-30 DIAGNOSIS — J984 Other disorders of lung: Secondary | ICD-10-CM | POA: Diagnosis not present

## 2018-08-30 DIAGNOSIS — I714 Abdominal aortic aneurysm, without rupture: Secondary | ICD-10-CM | POA: Diagnosis not present

## 2018-08-30 DIAGNOSIS — R410 Disorientation, unspecified: Secondary | ICD-10-CM | POA: Diagnosis not present

## 2018-08-30 DIAGNOSIS — I10 Essential (primary) hypertension: Secondary | ICD-10-CM | POA: Diagnosis not present

## 2018-08-30 DIAGNOSIS — I7789 Other specified disorders of arteries and arterioles: Secondary | ICD-10-CM | POA: Diagnosis not present

## 2018-08-30 DIAGNOSIS — R112 Nausea with vomiting, unspecified: Secondary | ICD-10-CM | POA: Diagnosis not present

## 2018-08-30 DIAGNOSIS — E785 Hyperlipidemia, unspecified: Secondary | ICD-10-CM | POA: Diagnosis not present

## 2018-08-30 DIAGNOSIS — G4733 Obstructive sleep apnea (adult) (pediatric): Secondary | ICD-10-CM | POA: Diagnosis not present

## 2018-08-30 DIAGNOSIS — R404 Transient alteration of awareness: Secondary | ICD-10-CM | POA: Diagnosis not present

## 2018-08-30 DIAGNOSIS — R0902 Hypoxemia: Secondary | ICD-10-CM | POA: Diagnosis not present

## 2018-08-30 DIAGNOSIS — K219 Gastro-esophageal reflux disease without esophagitis: Secondary | ICD-10-CM | POA: Diagnosis not present

## 2018-08-30 DIAGNOSIS — K311 Adult hypertrophic pyloric stenosis: Secondary | ICD-10-CM | POA: Diagnosis not present

## 2018-08-30 DIAGNOSIS — E871 Hypo-osmolality and hyponatremia: Secondary | ICD-10-CM | POA: Diagnosis not present

## 2018-08-30 DIAGNOSIS — Z7409 Other reduced mobility: Secondary | ICD-10-CM | POA: Diagnosis not present

## 2018-08-30 DIAGNOSIS — Z743 Need for continuous supervision: Secondary | ICD-10-CM | POA: Diagnosis not present

## 2018-09-02 DIAGNOSIS — I7789 Other specified disorders of arteries and arterioles: Secondary | ICD-10-CM | POA: Diagnosis not present

## 2018-09-02 DIAGNOSIS — R945 Abnormal results of liver function studies: Secondary | ICD-10-CM | POA: Diagnosis not present

## 2018-09-02 DIAGNOSIS — I70203 Unspecified atherosclerosis of native arteries of extremities, bilateral legs: Secondary | ICD-10-CM | POA: Diagnosis not present

## 2018-09-02 DIAGNOSIS — J431 Panlobular emphysema: Secondary | ICD-10-CM | POA: Diagnosis not present

## 2018-09-02 DIAGNOSIS — J984 Other disorders of lung: Secondary | ICD-10-CM | POA: Diagnosis not present

## 2018-09-02 DIAGNOSIS — I714 Abdominal aortic aneurysm, without rupture: Secondary | ICD-10-CM | POA: Diagnosis not present

## 2018-09-02 DIAGNOSIS — G4733 Obstructive sleep apnea (adult) (pediatric): Secondary | ICD-10-CM | POA: Diagnosis not present

## 2018-09-02 DIAGNOSIS — J438 Other emphysema: Secondary | ICD-10-CM | POA: Diagnosis not present

## 2018-09-02 DIAGNOSIS — J8489 Other specified interstitial pulmonary diseases: Secondary | ICD-10-CM | POA: Diagnosis not present

## 2018-09-05 DIAGNOSIS — F0150 Vascular dementia without behavioral disturbance: Secondary | ICD-10-CM | POA: Diagnosis not present

## 2018-09-05 DIAGNOSIS — J431 Panlobular emphysema: Secondary | ICD-10-CM | POA: Diagnosis not present

## 2018-09-05 DIAGNOSIS — I10 Essential (primary) hypertension: Secondary | ICD-10-CM | POA: Diagnosis not present

## 2018-09-05 DIAGNOSIS — R945 Abnormal results of liver function studies: Secondary | ICD-10-CM | POA: Diagnosis not present

## 2018-09-09 DIAGNOSIS — K8020 Calculus of gallbladder without cholecystitis without obstruction: Secondary | ICD-10-CM | POA: Diagnosis not present

## 2018-09-09 DIAGNOSIS — E785 Hyperlipidemia, unspecified: Secondary | ICD-10-CM | POA: Diagnosis not present

## 2018-09-09 DIAGNOSIS — J1100 Influenza due to unidentified influenza virus with unspecified type of pneumonia: Secondary | ICD-10-CM | POA: Diagnosis not present

## 2018-09-09 DIAGNOSIS — I2510 Atherosclerotic heart disease of native coronary artery without angina pectoris: Secondary | ICD-10-CM | POA: Diagnosis not present

## 2018-09-09 DIAGNOSIS — E1151 Type 2 diabetes mellitus with diabetic peripheral angiopathy without gangrene: Secondary | ICD-10-CM | POA: Diagnosis not present

## 2018-09-09 DIAGNOSIS — I70203 Unspecified atherosclerosis of native arteries of extremities, bilateral legs: Secondary | ICD-10-CM | POA: Diagnosis not present

## 2018-09-09 DIAGNOSIS — K219 Gastro-esophageal reflux disease without esophagitis: Secondary | ICD-10-CM | POA: Diagnosis not present

## 2018-09-09 DIAGNOSIS — J439 Emphysema, unspecified: Secondary | ICD-10-CM | POA: Diagnosis not present

## 2018-09-09 DIAGNOSIS — F325 Major depressive disorder, single episode, in full remission: Secondary | ICD-10-CM | POA: Diagnosis not present

## 2018-09-09 DIAGNOSIS — I714 Abdominal aortic aneurysm, without rupture: Secondary | ICD-10-CM | POA: Diagnosis not present

## 2018-09-09 DIAGNOSIS — I10 Essential (primary) hypertension: Secondary | ICD-10-CM | POA: Diagnosis not present

## 2018-09-09 DIAGNOSIS — Z7982 Long term (current) use of aspirin: Secondary | ICD-10-CM | POA: Diagnosis not present

## 2018-09-09 DIAGNOSIS — Z794 Long term (current) use of insulin: Secondary | ICD-10-CM | POA: Diagnosis not present

## 2018-09-09 DIAGNOSIS — F0390 Unspecified dementia without behavioral disturbance: Secondary | ICD-10-CM | POA: Diagnosis not present

## 2018-09-09 DIAGNOSIS — G4733 Obstructive sleep apnea (adult) (pediatric): Secondary | ICD-10-CM | POA: Diagnosis not present

## 2018-09-09 DIAGNOSIS — E871 Hypo-osmolality and hyponatremia: Secondary | ICD-10-CM | POA: Diagnosis not present

## 2018-09-10 DIAGNOSIS — J1100 Influenza due to unidentified influenza virus with unspecified type of pneumonia: Secondary | ICD-10-CM | POA: Diagnosis not present

## 2018-09-10 DIAGNOSIS — J439 Emphysema, unspecified: Secondary | ICD-10-CM | POA: Diagnosis not present

## 2018-09-10 DIAGNOSIS — E1151 Type 2 diabetes mellitus with diabetic peripheral angiopathy without gangrene: Secondary | ICD-10-CM | POA: Diagnosis not present

## 2018-09-10 DIAGNOSIS — I70203 Unspecified atherosclerosis of native arteries of extremities, bilateral legs: Secondary | ICD-10-CM | POA: Diagnosis not present

## 2018-09-10 DIAGNOSIS — I2510 Atherosclerotic heart disease of native coronary artery without angina pectoris: Secondary | ICD-10-CM | POA: Diagnosis not present

## 2018-09-10 DIAGNOSIS — I10 Essential (primary) hypertension: Secondary | ICD-10-CM | POA: Diagnosis not present

## 2018-09-11 DIAGNOSIS — J1100 Influenza due to unidentified influenza virus with unspecified type of pneumonia: Secondary | ICD-10-CM | POA: Diagnosis not present

## 2018-09-11 DIAGNOSIS — J439 Emphysema, unspecified: Secondary | ICD-10-CM | POA: Diagnosis not present

## 2018-09-11 DIAGNOSIS — E1151 Type 2 diabetes mellitus with diabetic peripheral angiopathy without gangrene: Secondary | ICD-10-CM | POA: Diagnosis not present

## 2018-09-11 DIAGNOSIS — I70203 Unspecified atherosclerosis of native arteries of extremities, bilateral legs: Secondary | ICD-10-CM | POA: Diagnosis not present

## 2018-09-11 DIAGNOSIS — I2510 Atherosclerotic heart disease of native coronary artery without angina pectoris: Secondary | ICD-10-CM | POA: Diagnosis not present

## 2018-09-11 DIAGNOSIS — I10 Essential (primary) hypertension: Secondary | ICD-10-CM | POA: Diagnosis not present

## 2018-09-13 DIAGNOSIS — I10 Essential (primary) hypertension: Secondary | ICD-10-CM | POA: Diagnosis not present

## 2018-09-13 DIAGNOSIS — I70203 Unspecified atherosclerosis of native arteries of extremities, bilateral legs: Secondary | ICD-10-CM | POA: Diagnosis not present

## 2018-09-13 DIAGNOSIS — J1100 Influenza due to unidentified influenza virus with unspecified type of pneumonia: Secondary | ICD-10-CM | POA: Diagnosis not present

## 2018-09-13 DIAGNOSIS — I2510 Atherosclerotic heart disease of native coronary artery without angina pectoris: Secondary | ICD-10-CM | POA: Diagnosis not present

## 2018-09-13 DIAGNOSIS — E1151 Type 2 diabetes mellitus with diabetic peripheral angiopathy without gangrene: Secondary | ICD-10-CM | POA: Diagnosis not present

## 2018-09-13 DIAGNOSIS — J439 Emphysema, unspecified: Secondary | ICD-10-CM | POA: Diagnosis not present

## 2018-09-17 ENCOUNTER — Ambulatory Visit: Payer: Medicare Other

## 2018-09-17 ENCOUNTER — Other Ambulatory Visit: Payer: Self-pay

## 2018-09-17 DIAGNOSIS — E1151 Type 2 diabetes mellitus with diabetic peripheral angiopathy without gangrene: Secondary | ICD-10-CM | POA: Diagnosis not present

## 2018-09-17 DIAGNOSIS — J439 Emphysema, unspecified: Secondary | ICD-10-CM | POA: Diagnosis not present

## 2018-09-17 DIAGNOSIS — I10 Essential (primary) hypertension: Secondary | ICD-10-CM | POA: Diagnosis not present

## 2018-09-17 DIAGNOSIS — J1100 Influenza due to unidentified influenza virus with unspecified type of pneumonia: Secondary | ICD-10-CM | POA: Diagnosis not present

## 2018-09-17 DIAGNOSIS — I70203 Unspecified atherosclerosis of native arteries of extremities, bilateral legs: Secondary | ICD-10-CM | POA: Diagnosis not present

## 2018-09-17 DIAGNOSIS — I2510 Atherosclerotic heart disease of native coronary artery without angina pectoris: Secondary | ICD-10-CM | POA: Diagnosis not present

## 2018-09-18 DIAGNOSIS — I10 Essential (primary) hypertension: Secondary | ICD-10-CM | POA: Diagnosis not present

## 2018-09-18 DIAGNOSIS — J1100 Influenza due to unidentified influenza virus with unspecified type of pneumonia: Secondary | ICD-10-CM | POA: Diagnosis not present

## 2018-09-18 DIAGNOSIS — I2510 Atherosclerotic heart disease of native coronary artery without angina pectoris: Secondary | ICD-10-CM | POA: Diagnosis not present

## 2018-09-18 DIAGNOSIS — I70203 Unspecified atherosclerosis of native arteries of extremities, bilateral legs: Secondary | ICD-10-CM | POA: Diagnosis not present

## 2018-09-18 DIAGNOSIS — E1151 Type 2 diabetes mellitus with diabetic peripheral angiopathy without gangrene: Secondary | ICD-10-CM | POA: Diagnosis not present

## 2018-09-18 DIAGNOSIS — J439 Emphysema, unspecified: Secondary | ICD-10-CM | POA: Diagnosis not present

## 2018-09-20 DIAGNOSIS — J1100 Influenza due to unidentified influenza virus with unspecified type of pneumonia: Secondary | ICD-10-CM | POA: Diagnosis not present

## 2018-09-20 DIAGNOSIS — I10 Essential (primary) hypertension: Secondary | ICD-10-CM | POA: Diagnosis not present

## 2018-09-20 DIAGNOSIS — I2510 Atherosclerotic heart disease of native coronary artery without angina pectoris: Secondary | ICD-10-CM | POA: Diagnosis not present

## 2018-09-20 DIAGNOSIS — J439 Emphysema, unspecified: Secondary | ICD-10-CM | POA: Diagnosis not present

## 2018-09-20 DIAGNOSIS — I70203 Unspecified atherosclerosis of native arteries of extremities, bilateral legs: Secondary | ICD-10-CM | POA: Diagnosis not present

## 2018-09-20 DIAGNOSIS — E1151 Type 2 diabetes mellitus with diabetic peripheral angiopathy without gangrene: Secondary | ICD-10-CM | POA: Diagnosis not present

## 2018-09-23 DIAGNOSIS — I10 Essential (primary) hypertension: Secondary | ICD-10-CM | POA: Diagnosis not present

## 2018-09-23 DIAGNOSIS — I70203 Unspecified atherosclerosis of native arteries of extremities, bilateral legs: Secondary | ICD-10-CM | POA: Diagnosis not present

## 2018-09-23 DIAGNOSIS — J439 Emphysema, unspecified: Secondary | ICD-10-CM | POA: Diagnosis not present

## 2018-09-23 DIAGNOSIS — I2510 Atherosclerotic heart disease of native coronary artery without angina pectoris: Secondary | ICD-10-CM | POA: Diagnosis not present

## 2018-09-23 DIAGNOSIS — E1151 Type 2 diabetes mellitus with diabetic peripheral angiopathy without gangrene: Secondary | ICD-10-CM | POA: Diagnosis not present

## 2018-09-23 DIAGNOSIS — J1100 Influenza due to unidentified influenza virus with unspecified type of pneumonia: Secondary | ICD-10-CM | POA: Diagnosis not present

## 2018-09-26 DIAGNOSIS — I70203 Unspecified atherosclerosis of native arteries of extremities, bilateral legs: Secondary | ICD-10-CM | POA: Diagnosis not present

## 2018-09-26 DIAGNOSIS — J1100 Influenza due to unidentified influenza virus with unspecified type of pneumonia: Secondary | ICD-10-CM | POA: Diagnosis not present

## 2018-09-26 DIAGNOSIS — I10 Essential (primary) hypertension: Secondary | ICD-10-CM | POA: Diagnosis not present

## 2018-09-26 DIAGNOSIS — E1151 Type 2 diabetes mellitus with diabetic peripheral angiopathy without gangrene: Secondary | ICD-10-CM | POA: Diagnosis not present

## 2018-09-26 DIAGNOSIS — I2510 Atherosclerotic heart disease of native coronary artery without angina pectoris: Secondary | ICD-10-CM | POA: Diagnosis not present

## 2018-09-26 DIAGNOSIS — J439 Emphysema, unspecified: Secondary | ICD-10-CM | POA: Diagnosis not present

## 2018-09-27 DIAGNOSIS — I2510 Atherosclerotic heart disease of native coronary artery without angina pectoris: Secondary | ICD-10-CM | POA: Diagnosis not present

## 2018-09-27 DIAGNOSIS — E1151 Type 2 diabetes mellitus with diabetic peripheral angiopathy without gangrene: Secondary | ICD-10-CM | POA: Diagnosis not present

## 2018-09-27 DIAGNOSIS — J439 Emphysema, unspecified: Secondary | ICD-10-CM | POA: Diagnosis not present

## 2018-09-27 DIAGNOSIS — I70203 Unspecified atherosclerosis of native arteries of extremities, bilateral legs: Secondary | ICD-10-CM | POA: Diagnosis not present

## 2018-09-27 DIAGNOSIS — I10 Essential (primary) hypertension: Secondary | ICD-10-CM | POA: Diagnosis not present

## 2018-09-27 DIAGNOSIS — J1100 Influenza due to unidentified influenza virus with unspecified type of pneumonia: Secondary | ICD-10-CM | POA: Diagnosis not present

## 2018-09-30 ENCOUNTER — Telehealth: Payer: Self-pay | Admitting: *Deleted

## 2018-09-30 NOTE — Telephone Encounter (Signed)
FYI VM from Jon [name] w/ [facility] Pt is opting out of services at this time d/t current situation of COVID-19 [facility] will do caring call

## 2018-10-01 ENCOUNTER — Other Ambulatory Visit: Payer: Self-pay

## 2018-10-01 ENCOUNTER — Ambulatory Visit (INDEPENDENT_AMBULATORY_CARE_PROVIDER_SITE_OTHER): Payer: Medicare Other | Admitting: Family Medicine

## 2018-10-01 ENCOUNTER — Encounter: Payer: Self-pay | Admitting: Family Medicine

## 2018-10-01 ENCOUNTER — Ambulatory Visit: Payer: Medicare Other | Admitting: Nurse Practitioner

## 2018-10-01 DIAGNOSIS — R809 Proteinuria, unspecified: Secondary | ICD-10-CM | POA: Diagnosis not present

## 2018-10-01 DIAGNOSIS — L308 Other specified dermatitis: Secondary | ICD-10-CM | POA: Diagnosis not present

## 2018-10-01 DIAGNOSIS — E1129 Type 2 diabetes mellitus with other diabetic kidney complication: Secondary | ICD-10-CM

## 2018-10-01 DIAGNOSIS — N3943 Post-void dribbling: Secondary | ICD-10-CM

## 2018-10-01 MED ORDER — CLOTRIMAZOLE-BETAMETHASONE 1-0.05 % EX CREA: 1.0000 "application " | TOPICAL_CREAM | Freq: Two times a day (BID) | CUTANEOUS | 1 refills | Status: DC

## 2018-10-01 NOTE — Progress Notes (Signed)
Subjective:  Patient ID: Jon [name], male    DOB: Mar 07, 1946  Age: 73 y.o. MRN: [id number]  CC: No chief complaint on file.   HPI Jon [name] presents for rash on privates. Raw. Leaking urine. Wife gives history. He knows when he needs to go but leaking after finishing. Penis is red. None on scrotum. One little spot on leg cleared promptly last week. Glucose is awesome 108-127 without lantus or trulicity. Not eating as much as he used to. Off Lantus and trulicity. Using metoclopromide TID to prevent vomiting. Doing better since starting it. Wife concerned if he should continue it.    History Jon [name] has a past medical history of AAA (abdominal aortic aneurysm) (HCC), Alzheimer disease (HCC), Anemia, Ataxia, Diabetes mellitus without complication (HCC), History of gallstones, Hyperlipidemia, Hypertension, and Vitamin D deficiency.   He has a past surgical history that includes ERCP; Inguinal hernia repair; and Tonsillectomy.   His family history includes Diabetes in his brother and mother; Hip fracture in his mother; Hyperlipidemia in his brother; Hypertension in his brother; Lupus in his sister; Pulmonary embolism in his mother; Scleroderma in his sister.He reports that he quit smoking about 9 years ago. His smoking use included cigarettes. He started smoking about 52 years ago. He has a 40.00 pack-year smoking history. He has never used smokeless tobacco. He reports that he does not drink alcohol or use drugs.    ROS Review of Systems  Constitutional: Positive for appetite change (eating less than in the past). Negative for fever.  Respiratory: Negative for shortness of breath.   Cardiovascular: Negative for chest pain.  Genitourinary: Positive for enuresis. Negative for decreased urine volume, discharge, dysuria, penile pain, penile swelling and urgency.  Skin: Negative for rash.    Objective:  There were no vitals taken for this visit.  BP Readings from Last 3 Encounters:   08/23/18 108/75  08/21/18 120/71  08/02/18 124/71    Wt Readings from Last 3 Encounters:  08/12/18 178 lb 9.2 oz (81 kg)  05/03/18 178 lb 12.8 oz (81.1 kg)  04/08/18 195 lb (88.5 kg)     Physical Exam  Phone visit  Assessment & Plan:   Diagnoses and all orders for this visit:  Microalbuminuria due to type 2 diabetes mellitus (HCC)  Post-void dribbling  Other eczema  Other orders -     clotrimazole-betamethasone (LOTRISONE) cream; Apply 1 application topically 2 (two) times daily. To affected areas until rash clears       I am having Jon [name] start on clotrimazole-betamethasone. I am also having him maintain his aspirin, Acetaminophen (TYLENOL ARTHRITIS EXT RELIEF PO), glucose blood, lisinopril, metFORMIN, citalopram, Sure Comfort Pen Needles, ondansetron, Vitamin D, rivastigmine, and omeprazole.  Allergies as of 10/01/2018      Reactions   Donepezil Nausea And Vomiting   Namenda [memantine Hcl] Other (See Comments)   Sleeps all the time   Phenergan [promethazine Hcl] Other (See Comments)   Aggressive       Medication List       Accurate as of October 01, 2018  1:27 PM. Always use your most recent med list.        aspirin 81 MG tablet Take 81 mg by mouth daily.   citalopram 10 MG tablet Commonly known as:  CELEXA Take 10 mg by mouth daily.   clotrimazole-betamethasone cream Commonly known as:  Lotrisone Apply 1 application topically 2 (two) times daily. To affected areas until rash clears   glucose  blood test strip Check fasting and 2 hours after biggest meal   lisinopril 5 MG tablet Commonly known as:  PRINIVIL,ZESTRIL Take 1 tablet (5 mg total) by mouth daily.   metFORMIN 1000 MG tablet Commonly known as:  GLUCOPHAGE Take 1 tablet (1,000 mg total) by mouth 2 (two) times daily with a meal.   omeprazole 20 MG capsule Commonly known as:  PRILOSEC TAKE 1 CAPSULE DAILY   ondansetron 8 MG disintegrating tablet Commonly known as:   ZOFRAN-ODT Take 1 tablet (8 mg total) by mouth every 6 (six) hours as needed for nausea or vomiting.   rivastigmine 4.5 MG capsule Commonly known as:  EXELON TAKE 1 CAPSULE TWICE A DAY   Sure Comfort Pen Needles 32G X 6 MM Misc Generic drug:  Insulin Pen Needle USE AS DIRECTED   TYLENOL ARTHRITIS EXT RELIEF PO Take by mouth.   Vitamin D 50 MCG (2000 UT) Caps Take 1 capsule by mouth daily.      Virtual Visit via telephone Note  I discussed the limitations, risks, security and privacy concerns of performing an evaluation and management service by telephone and the availability of in person appointments. I also discussed with the patient that there may be a patient responsible charge related to this service. The patient expressed understanding and agreed to proceed.  Follow Up Instructions:   I discussed the assessment and treatment plan with the patient. The patient was provided an opportunity to ask questions and all were answered. The patient agreed with the plan and demonstrated an understanding of the instructions.   The patient was advised to call back or seek an in-person evaluation if the symptoms worsen or if the condition fails to improve as anticipated.  Call started: 1:13 Call ended:  1:25 Call minutes: 12   Follow-up: Return if symptoms worsen or fail to improve.  Mechele Claude, M.D.

## 2018-10-10 ENCOUNTER — Telehealth: Payer: Self-pay | Admitting: Family Medicine

## 2018-10-10 ENCOUNTER — Other Ambulatory Visit: Payer: Self-pay | Admitting: Family Medicine

## 2018-10-10 NOTE — Telephone Encounter (Signed)
noted 

## 2018-10-14 NOTE — Telephone Encounter (Signed)
Notified [name] that Dr. Darlyn Read will address when he is back from vacation.

## 2018-10-15 ENCOUNTER — Telehealth: Payer: Self-pay | Admitting: Family Medicine

## 2018-10-15 MED ORDER — FREESTYLE FREEDOM LITE W/DEVICE KIT: PACK | 0 refills | Status: DC

## 2018-10-15 MED ORDER — BLOOD GLUCOSE METER KIT: PACK | 0 refills | Status: DC

## 2018-10-15 NOTE — Telephone Encounter (Signed)
Please  write and I will sign. Thanks, WS 

## 2018-10-15 NOTE — Telephone Encounter (Signed)
Pt is needing a new diabetic meter, they have stuff for the freestyle would like another one of those sent in.   The RX needs a medical code per pharmacy  Pharmacy [facility]

## 2018-10-15 NOTE — Telephone Encounter (Signed)
Letter completed, and given to Dr. Darlyn Read to be signed.

## 2018-10-15 NOTE — Telephone Encounter (Signed)
[name] notified that letter ready and also sent in new glucose monitor per her request to [facility]

## 2018-10-21 ENCOUNTER — Ambulatory Visit (INDEPENDENT_AMBULATORY_CARE_PROVIDER_SITE_OTHER): Payer: Medicare Other | Admitting: Family Medicine

## 2018-10-21 ENCOUNTER — Telehealth: Payer: Self-pay | Admitting: Family Medicine

## 2018-10-21 ENCOUNTER — Encounter: Payer: Self-pay | Admitting: Family Medicine

## 2018-10-21 ENCOUNTER — Other Ambulatory Visit: Payer: Self-pay

## 2018-10-21 DIAGNOSIS — F0281 Dementia in other diseases classified elsewhere with behavioral disturbance: Secondary | ICD-10-CM | POA: Diagnosis not present

## 2018-10-21 DIAGNOSIS — J849 Interstitial pulmonary disease, unspecified: Secondary | ICD-10-CM | POA: Diagnosis not present

## 2018-10-21 DIAGNOSIS — R319 Hematuria, unspecified: Secondary | ICD-10-CM | POA: Diagnosis not present

## 2018-10-21 DIAGNOSIS — S32000G Wedge compression fracture of unspecified lumbar vertebra, subsequent encounter for fracture with delayed healing: Secondary | ICD-10-CM | POA: Diagnosis not present

## 2018-10-21 DIAGNOSIS — R278 Other lack of coordination: Secondary | ICD-10-CM

## 2018-10-21 DIAGNOSIS — G20 Parkinson's disease: Secondary | ICD-10-CM | POA: Diagnosis not present

## 2018-10-21 DIAGNOSIS — G3183 Dementia with Lewy bodies: Secondary | ICD-10-CM | POA: Diagnosis not present

## 2018-10-21 DIAGNOSIS — Z87898 Personal history of other specified conditions: Secondary | ICD-10-CM | POA: Diagnosis not present

## 2018-10-21 DIAGNOSIS — R300 Dysuria: Secondary | ICD-10-CM | POA: Diagnosis not present

## 2018-10-21 DIAGNOSIS — J439 Emphysema, unspecified: Secondary | ICD-10-CM | POA: Diagnosis not present

## 2018-10-21 DIAGNOSIS — N390 Urinary tract infection, site not specified: Secondary | ICD-10-CM | POA: Diagnosis not present

## 2018-10-21 DIAGNOSIS — F02818 Dementia in other diseases classified elsewhere, unspecified severity, with other behavioral disturbance: Secondary | ICD-10-CM

## 2018-10-21 DIAGNOSIS — G20C Parkinsonism, unspecified: Secondary | ICD-10-CM

## 2018-10-21 LAB — URINALYSIS
Bilirubin, UA: NEGATIVE
Glucose, UA: NEGATIVE
Leukocytes,UA: NEGATIVE
Nitrite, UA: POSITIVE — AB
Specific Gravity, UA: >1.03 — ABNORMAL HIGH (ref 1.005–1.030)
Urobilinogen, Ur: 4 mg/dL — ABNORMAL HIGH (ref 0.2–1.0)
pH, UA: 5 (ref 5.0–7.5)

## 2018-10-21 MED ORDER — CIPROFLOXACIN HCL 500 MG PO TABS: 500.0000 mg | ORAL_TABLET | Freq: Two times a day (BID) | ORAL | 0 refills | Status: DC

## 2018-10-21 NOTE — Progress Notes (Addendum)
Subjective:    Patient ID: Jon [name], male    DOB: 08/01/1945, 73 y.o.   MRN: [id number]  CC: Altered Mental Status   HPI: Jon [name] is a 73 y.o. male presenting for Altered Mental Status  Fever to 100.4 yesterday. Can barely move. Right leg pain noted by wife. Pt. Denies. Says he is staying in bed a lot. Poor appetite last three days. Bedridden three days because he can't focus, listen to her instruction to help him transfer.When asked specifically patient denies pain.  Doesn't know if leg hurts or not. Not sure why he is staying in bed so much. Doesn't remember if he always is bedridden or not. Wife states he usually gets up to go to his recliner. HE has trouble gettin in and out of the chair due to weakness of his legs.  He has to have maximum assistance from his wife.  She can get him to the chair with a walker but he has to fall into it and they both struggle to get him up from the chair.  He suffers from dementia that makes it difficult for him to cooperate with her while she is trying to assist him.  He has significant debility from deconditioning and his weakness and pain from lumbar compression fracture.  He is also weakened by bullous emphysema and hypoxemia.  His gait at best is unsteady and he is at high danger for falling.  Followed she is also diagnosed him with parkinsonism.  His wife and within limits of his ability to understand the patient himself feel that he could benefit from made lift chair mechanism.  This would allow him to be more mobile to his living room from his bed.    BP 110/69  Glu usually 90-110. Today up to 164.    [facility] 08/23/2018 05/03/2018 03/12/2018 02/06/2018 09/07/2017  Decreased Interest 0 0 0 0 0  Down, Depressed, Hopeless 0 0 0 0 0  PHQ - 2 Score 0 0 0 0 0  Altered sleeping - - - - -  Tired, decreased energy - - - - -  Change in appetite - - - - -  Feeling bad or failure about yourself  - - - - -  Trouble concentrating - - -  - -  Moving slowly or fidgety/restless - - - - -  Suicidal thoughts - - - - -  PHQ-9 Score - - - - -     Relevant past medical, surgical, family and social history reviewed and updated as indicated.  Interim medical history since our last visit reviewed. Allergies and medications reviewed and updated.  ROS:  Review of Systems  Constitutional: Positive for activity change, appetite change, fatigue and fever.  HENT: Negative.   Eyes: Negative for visual disturbance.  Respiratory: Negative for cough and shortness of breath.   Cardiovascular: Negative for chest pain and leg swelling.  Gastrointestinal: Negative for abdominal pain, diarrhea, nausea and vomiting.  Genitourinary: Negative for difficulty urinating.  Musculoskeletal: Positive for gait problem (to weak to make transfers.). Negative for arthralgias and myalgias.  Neurological: Positive for weakness. Negative for headaches.  Psychiatric/Behavioral: Negative for sleep disturbance.     Social History   Tobacco Use  Smoking Status Former Smoker  . Packs/day: 1.00  . Years: 40.00  . Pack years: 40.00  . Types: Cigarettes  . Start date: 07/03/1966  . Last attempt to quit: 02/06/2009  . Years since quitting: 9.7  Smokeless  Tobacco Never Used       Objective:     Wt Readings from Last 3 Encounters:  08/12/18 178 lb 9.2 oz (81 kg)  05/03/18 178 lb 12.8 oz (81.1 kg)  04/08/18 195 lb (88.5 kg)     Exam deferred. Pt. Harboring due to COVID 19. Phone visit performed.   Assessment & Plan:  E specimen. 2+ blood, positive nitrite, 2+ blood. Wife brough in [name] was seen today for altered mental status.  Diagnoses and all orders for this visit:  Urinary tract infection with hematuria, site unspecified -     Urine Culture -     Urinalysis  Lewy body dementia with behavioral disturbance (HCC)  Bullous emphysema (HCC)  ILD (interstitial lung disease) (HCC)  Compression fracture of lumbar vertebra with delayed healing,  unspecified lumbar vertebral level, subsequent encounter  History of unsteady gait  Muscular incoordination  Primary Parkinsonism (HCC)  Other orders -     Discontinue: ciprofloxacin (CIPRO) 500 MG tablet; Take 1 tablet (500 mg total) by mouth 2 (two) times daily. For prostate. Take all of these. -     ciprofloxacin (CIPRO) 500 MG tablet; Take 1 tablet (500 mg total) by mouth 2 (two) times daily. For prostate. Take all of these.   Virtual Visit via telephone Note  I discussed the limitations, risks, security and privacy concerns of performing an evaluation and management service by telephone and the availability of in person appointments. The patient expressed understanding and agreed to proceed. Pt. Is at home. Dr. Darlyn Read is in his office.  Follow Up Instructions:   I discussed the assessment and treatment plan with the patient. The patient was provided an opportunity to ask questions and all were answered. The patient agreed with the plan and demonstrated an understanding of the instructions.   The patient was advised to call back or seek an in-person evaluation if the symptoms worsen or if the condition fails to improve as anticipated.  Visit started: 11:02 Call ended:  11:13 Total minutes including chart review and phone contact time: 21   Follow up plan: No follow-ups on file.  Mechele Claude, MD [place] [facility] 11/20/2018, 11:54 AM

## 2018-10-21 NOTE — Telephone Encounter (Signed)
Patient's wife states he has been running a low grade fever - around 100, and he is not acting like himself.  She he is not as responsive as usual, and acts tired.  She states she can have someone bring a urine sample to the office.  Scheduled patient for telephone visit with Dr. Darlyn Read today at 10:40 am.

## 2018-10-24 LAB — URINE CULTURE

## 2018-10-28 ENCOUNTER — Ambulatory Visit: Payer: Medicare Other | Admitting: Family Medicine

## 2018-10-28 ENCOUNTER — Other Ambulatory Visit: Payer: Self-pay | Admitting: Family Medicine

## 2018-10-28 DIAGNOSIS — B370 Candidal stomatitis: Secondary | ICD-10-CM

## 2018-10-28 MED ORDER — NYSTATIN 100000 UNIT/ML MT SUSP: 5.0000 mL | Freq: Four times a day (QID) | OROMUCOSAL | 0 refills | Status: DC

## 2018-10-28 NOTE — Progress Notes (Signed)
Pt called on call provider. Pt recently started on Cipro for UTI. Pt has since developed a thick white coating on his tongue. States he is unable to brush the coating off. Requesting medication. Will send in Nystatin swish and swallow. Directions on proper use provided.

## 2018-10-29 ENCOUNTER — Telehealth: Payer: Self-pay

## 2018-10-29 ENCOUNTER — Other Ambulatory Visit: Payer: Self-pay | Admitting: Family Medicine

## 2018-10-29 ENCOUNTER — Telehealth: Payer: Self-pay | Admitting: Family Medicine

## 2018-10-29 DIAGNOSIS — R270 Ataxia, unspecified: Secondary | ICD-10-CM

## 2018-10-29 DIAGNOSIS — R278 Other lack of coordination: Secondary | ICD-10-CM

## 2018-10-29 DIAGNOSIS — Z87898 Personal history of other specified conditions: Secondary | ICD-10-CM

## 2018-10-29 DIAGNOSIS — B370 Candidal stomatitis: Secondary | ICD-10-CM

## 2018-10-29 DIAGNOSIS — S32000D Wedge compression fracture of unspecified lumbar vertebra, subsequent encounter for fracture with routine healing: Secondary | ICD-10-CM

## 2018-10-29 MED ORDER — NYSTATIN 100000 UNIT/ML MT SUSP: 5.0000 mL | Freq: Four times a day (QID) | OROMUCOSAL | 0 refills | Status: AC

## 2018-10-29 NOTE — Telephone Encounter (Signed)
Spoke with wife - she is needing a DME order for a  "lift chair Mechanism "   This is a piece that will attach to his recliner and make it Lift.  [facility] will not pay for a lift chair - BUT WILL pay for this.  Needs dme order and fax to [facility] in [place].

## 2018-10-29 NOTE — Telephone Encounter (Signed)
Pt got nystatin yesterday from [facility] - aware to cont. Antibiotics and use nystatin as needed.

## 2018-10-29 NOTE — Telephone Encounter (Signed)
Since any antibiotic can cause thrush, changing probably won't help. I will send in a liquid that can be used as a dropper or a lozenge that lays in his mouth. Let me know which one Ms. Lucker prefers.

## 2018-10-29 NOTE — Telephone Encounter (Signed)
Do you want to change rx or give something for thrush?

## 2018-10-29 NOTE — Telephone Encounter (Signed)
Order printed. WS

## 2018-10-29 NOTE — Telephone Encounter (Signed)
Please call me

## 2018-10-30 ENCOUNTER — Telehealth: Payer: Self-pay | Admitting: Family Medicine

## 2018-10-30 NOTE — Telephone Encounter (Signed)
Aware.  Order was faxed to [facility].

## 2018-11-05 ENCOUNTER — Ambulatory Visit (INDEPENDENT_AMBULATORY_CARE_PROVIDER_SITE_OTHER): Payer: Medicare Other | Admitting: Family Medicine

## 2018-11-05 ENCOUNTER — Other Ambulatory Visit: Payer: Self-pay

## 2018-11-05 ENCOUNTER — Encounter: Payer: Self-pay | Admitting: Family Medicine

## 2018-11-05 DIAGNOSIS — R278 Other lack of coordination: Secondary | ICD-10-CM

## 2018-11-05 DIAGNOSIS — F0281 Dementia in other diseases classified elsewhere with behavioral disturbance: Secondary | ICD-10-CM | POA: Diagnosis not present

## 2018-11-05 DIAGNOSIS — G3183 Dementia with Lewy bodies: Secondary | ICD-10-CM

## 2018-11-05 DIAGNOSIS — R630 Anorexia: Secondary | ICD-10-CM | POA: Diagnosis not present

## 2018-11-05 MED ORDER — MEGESTROL ACETATE 400 MG/10ML PO SUSP: 400.0000 mg | Freq: Two times a day (BID) | ORAL | 2 refills | Status: DC

## 2018-11-05 NOTE — Progress Notes (Signed)
Subjective:    Patient ID: Jon [name], male    DOB: 1946/03/20, 73 y.o.   MRN: [id number]   HPI: Jon [name] is a [age] y.o. male presenting for decreased appetite.  His wife says he only ate a small portion of pizza today and only 1 bag of instant oatmeal for breakfast this morning.  He did drink a can of Glucerna this afternoon.  However, this represents a significant decrease in his overall food intake.  His wife is concerned about weight loss.  She says his legs are getting thin and his face has lost weight.  He has been fighting dementia and become nonambulatory recently.  He just does not really get hungry very much.  Patient also has been getting some occupational therapy.  His wife feels like he is getting some benefit from it.  However, the workers have not been coming due to the COVID outbreak.  She would like to get that started back if possible.  She is of course worried about contamination from them going home at home.  Dep[facility] 08/23/2018 05/03/2018 03/12/2018 02/06/2018 09/07/2017  Decreased Interest 0 0 0 0 0  Down, Depressed, Hopeless 0 0 0 0 0  PHQ - 2 Score 0 0 0 0 0  Altered sleeping - - - - -  Tired, decreased energy - - - - -  Change in appetite - - - - -  Feeling bad or failure about yourself  - - - - -  Trouble concentrating - - - - -  Moving slowly or fidgety/restless - - - - -  Suicidal thoughts - - - - -  PHQ-9 Score - - - - -     Relevant past medical, surgical, family and social history reviewed and updated as indicated.  Interim medical history since our last visit reviewed. Allergies and medications reviewed and updated.  ROS:  Review of Systems  Constitutional: Positive for appetite change and unexpected weight change (losing weight, face getting thin). Negative for fever.  Respiratory: Negative for shortness of breath.   Cardiovascular: Negative for chest pain.  Musculoskeletal: Negative for arthralgias.  Skin: Negative for rash.    Social History   Tobacco Use  Smoking Status Former Smoker  . Packs/day: 1.00  . Years: 40.00  . Pack years: 40.00  . Types: Cigarettes  . Start date: 07/03/1966  . Last attempt to quit: 02/06/2009  . Years since quitting: 9.7  Smokeless Tobacco Never Used       Objective:     Wt Readings from Last 3 Encounters:  08/12/18 178 lb 9.2 oz (81 kg)  05/03/18 178 lb 12.8 oz (81.1 kg)  04/08/18 195 lb (88.5 kg)     Exam deferred. Pt. Harboring due to COVID 19. Phone visit performed.   Assessment & Plan:   1. Lewy body dementia with behavioral disturbance (HCC)   2. Muscular incoordination   3. Loss of appetite     Meds ordered this encounter  Medications  . megestrol (MEGACE) 400 MG/10ML suspension    Sig: Take 10 mLs (400 mg total) by mouth 2 (two) times daily. For appetite stimulation    Dispense:  600 mL    Refill:  2    No orders of the defined types were placed in this encounter.     Diagnoses and all orders for this visit:  Lewy body dementia with behavioral disturbance (HCC)  Muscular incoordination  Loss of appetite  Other  orders -     megestrol (MEGACE) 400 MG/10ML suspension; Take 10 mLs (400 mg total) by mouth 2 (two) times daily. For appetite stimulation    Virtual Visit via telephone Note  I discussed the limitations, risks, security and privacy concerns of performing an evaluation and management service by telephone and the availability of in person appointments. The patient was identified with two identifiers. Pt.expressed understanding and agreed to proceed. Pt. Is at home. Dr. Darlyn Read is in his office.  Follow Up Instructions:   I discussed the assessment and treatment plan with the patient. The patient was provided an opportunity to ask questions and all were answered. The patient agreed with the plan and demonstrated an understanding of the instructions.   The patient was advised to call back or seek an in-person evaluation if the symptoms  worsen or if the condition fails to improve as anticipated.   Total minutes including chart review and phone contact time: 18   Follow up plan: Return in about 6 weeks (around 12/17/2018).  Mechele Claude, MD [place] [facility]

## 2018-11-06 NOTE — Telephone Encounter (Signed)
Mechanism is not covered Rewrote order for [employer] chair lift Placed on providers desk for signature

## 2018-11-13 ENCOUNTER — Encounter: Payer: Self-pay | Admitting: Family Medicine

## 2018-11-13 ENCOUNTER — Ambulatory Visit (INDEPENDENT_AMBULATORY_CARE_PROVIDER_SITE_OTHER): Payer: Medicare Other | Admitting: Family Medicine

## 2018-11-13 ENCOUNTER — Other Ambulatory Visit: Payer: Self-pay

## 2018-11-13 DIAGNOSIS — G3183 Dementia with Lewy bodies: Secondary | ICD-10-CM | POA: Diagnosis not present

## 2018-11-13 DIAGNOSIS — R278 Other lack of coordination: Secondary | ICD-10-CM | POA: Diagnosis not present

## 2018-11-13 DIAGNOSIS — R29898 Other symptoms and signs involving the musculoskeletal system: Secondary | ICD-10-CM | POA: Diagnosis not present

## 2018-11-13 DIAGNOSIS — F0281 Dementia in other diseases classified elsewhere with behavioral disturbance: Secondary | ICD-10-CM | POA: Diagnosis not present

## 2018-11-13 DIAGNOSIS — Z87898 Personal history of other specified conditions: Secondary | ICD-10-CM

## 2018-11-13 DIAGNOSIS — F02818 Dementia in other diseases classified elsewhere, unspecified severity, with other behavioral disturbance: Secondary | ICD-10-CM

## 2018-11-13 NOTE — Addendum Note (Signed)
Addended by: Mechele Claude on: 11/13/2018 04:54 PM   Modules accepted: Orders

## 2018-11-13 NOTE — Progress Notes (Addendum)
Subjective:    Patient ID: Jon [name], male    DOB: Feb 07, 1946, 73 y.o.   MRN: [id number]   HPI: Jon [name] is a 73 y.o. male presenting for concerns for physical therapy. Losing muscle mass per home health nurse. Moves his upper ext well, but legs are very weak. Can't stand. Looking at needing a [employer] lift. Home health nurse suggested P.T. Appetite has improved dramatically since starting the megace. Glucose is staying good.    Depression screen [facility] 08/23/2018 05/03/2018 03/12/2018 02/06/2018 09/07/2017  Decreased Interest 0 0 0 0 0  Down, Depressed, Hopeless 0 0 0 0 0  PHQ - 2 Score 0 0 0 0 0  Altered sleeping - - - - -  Tired, decreased energy - - - - -  Change in appetite - - - - -  Feeling bad or failure about yourself  - - - - -  Trouble concentrating - - - - -  Moving slowly or fidgety/restless - - - - -  Suicidal thoughts - - - - -  PHQ-9 Score - - - - -     Relevant past medical, surgical, family and social history reviewed and updated as indicated.  Interim medical history since our last visit reviewed. Allergies and medications reviewed and updated.  ROS:  Review of Systems  Constitutional: Negative for fever.  Respiratory: Negative for shortness of breath.   Cardiovascular: Negative for chest pain.  Musculoskeletal: Positive for gait problem (making transfers only - with assist.). Negative for arthralgias.  Skin: Negative for rash.  Neurological: Positive for weakness (moderately severe al over, especially legs).     Social History   Tobacco Use  Smoking Status Former Smoker  . Packs/day: 1.00  . Years: 40.00  . Pack years: 40.00  . Types: Cigarettes  . Start date: 07/03/1966  . Last attempt to quit: 02/06/2009  . Years since quitting: 9.7  Smokeless Tobacco Never Used       Objective:     Wt Readings from Last 3 Encounters:  08/12/18 178 lb 9.2 oz (81 kg)  05/03/18 178 lb 12.8 oz (81.1 kg)  04/08/18 195 lb (88.5 kg)     Exam deferred. Pt.  Harboring due to COVID 19. Phone visit performed.   Assessment & Plan:   1. Severe muscle deconditioning   2. Muscular incoordination   3. Lewy body dementia with behavioral disturbance (HCC)   4. History of unsteady gait     No orders of the defined types were placed in this encounter.   Orders Placed This Encounter  Procedures  . Ambulatory referral to Home Health    Referral Priority:   Routine    Referral Type:   Home Health Care    Referral Reason:   Specialty Services Required    Requested Specialty:   Home Health Services    Number of Visits Requested:   1      Diagnoses and all orders for this visit:  Severe muscle deconditioning -     Ambulatory referral to Home Health  Muscular incoordination -     Ambulatory referral to Home Health  Lewy body dementia with behavioral disturbance (HCC) -     Ambulatory referral to Home Health  History of unsteady gait -     Ambulatory referral to Home Health    Virtual Visit via telephone Note  I discussed the limitations, risks, security and privacy concerns of performing an evaluation and management service  by telephone and the availability of in person appointments. The patient was identified with two identifiers. Pt.expressed understanding and agreed to proceed. Pt. Is at home. Dr. Darlyn Read is in his office.  Follow Up Instructions:   I discussed the assessment and treatment plan with the patient. The patient was provided an opportunity to ask questions and all were answered. The patient agreed with the plan and demonstrated an understanding of the instructions.   The patient was advised to call back or seek an in-person evaluation if the symptoms worsen or if the condition fails to improve as anticipated.   Total minutes including chart review and phone contact time: 12   Follow up plan: Return if symptoms worsen or fail to improve.  Mechele Claude, MD [place] [facility]

## 2018-11-14 NOTE — Telephone Encounter (Signed)
Rx faxed

## 2018-11-15 ENCOUNTER — Telehealth: Payer: Self-pay | Admitting: Family Medicine

## 2018-11-15 ENCOUNTER — Other Ambulatory Visit: Payer: Self-pay | Admitting: *Deleted

## 2018-11-15 DIAGNOSIS — S32000D Wedge compression fracture of unspecified lumbar vertebra, subsequent encounter for fracture with routine healing: Secondary | ICD-10-CM | POA: Diagnosis not present

## 2018-11-15 DIAGNOSIS — G3183 Dementia with Lewy bodies: Secondary | ICD-10-CM | POA: Diagnosis not present

## 2018-11-15 DIAGNOSIS — F0281 Dementia in other diseases classified elsewhere with behavioral disturbance: Secondary | ICD-10-CM | POA: Diagnosis not present

## 2018-11-15 DIAGNOSIS — B370 Candidal stomatitis: Secondary | ICD-10-CM

## 2018-11-15 DIAGNOSIS — M6281 Muscle weakness (generalized): Secondary | ICD-10-CM | POA: Diagnosis not present

## 2018-11-15 MED ORDER — NYSTATIN 100000 UNIT/ML MT SUSP: 5.0000 mL | Freq: Four times a day (QID) | OROMUCOSAL | 1 refills | Status: AC

## 2018-11-15 NOTE — Telephone Encounter (Signed)
Not on med list. Please advise.

## 2018-11-15 NOTE — Telephone Encounter (Signed)
What is the name of the medication? Nystatin   Have you contacted your pharmacy to request a refill? yes  Which pharmacy would you like this sent to? [facility]    Patient notified that their request is being sent to the clinical staff for review and that they should receive a call once it is complete. If they do not receive a call within 24 hours they can check with their pharmacy or our office.

## 2018-11-17 ENCOUNTER — Other Ambulatory Visit: Payer: Self-pay | Admitting: Family Medicine

## 2018-11-19 DIAGNOSIS — F0281 Dementia in other diseases classified elsewhere with behavioral disturbance: Secondary | ICD-10-CM | POA: Diagnosis not present

## 2018-11-19 DIAGNOSIS — S32000D Wedge compression fracture of unspecified lumbar vertebra, subsequent encounter for fracture with routine healing: Secondary | ICD-10-CM | POA: Diagnosis not present

## 2018-11-19 DIAGNOSIS — M6281 Muscle weakness (generalized): Secondary | ICD-10-CM | POA: Diagnosis not present

## 2018-11-19 DIAGNOSIS — G3183 Dementia with Lewy bodies: Secondary | ICD-10-CM | POA: Diagnosis not present

## 2018-11-20 DIAGNOSIS — G20 Parkinson's disease: Secondary | ICD-10-CM | POA: Insufficient documentation

## 2018-11-21 ENCOUNTER — Telehealth: Payer: Self-pay | Admitting: Family Medicine

## 2018-11-21 NOTE — Telephone Encounter (Signed)
Pt aware.

## 2018-11-21 NOTE — Telephone Encounter (Signed)
Diarrhea can be a side effect of Megace. He can increase his fiber intake to try to bulk his stool

## 2018-11-22 ENCOUNTER — Telehealth: Payer: Self-pay | Admitting: Family Medicine

## 2018-11-22 DIAGNOSIS — F0281 Dementia in other diseases classified elsewhere with behavioral disturbance: Secondary | ICD-10-CM | POA: Diagnosis not present

## 2018-11-22 DIAGNOSIS — S32000D Wedge compression fracture of unspecified lumbar vertebra, subsequent encounter for fracture with routine healing: Secondary | ICD-10-CM | POA: Diagnosis not present

## 2018-11-22 DIAGNOSIS — G3183 Dementia with Lewy bodies: Secondary | ICD-10-CM | POA: Diagnosis not present

## 2018-11-22 DIAGNOSIS — M6281 Muscle weakness (generalized): Secondary | ICD-10-CM | POA: Diagnosis not present

## 2018-11-22 NOTE — Telephone Encounter (Signed)
Please advise Lynden Ang  Not here.

## 2018-11-26 DIAGNOSIS — S32000D Wedge compression fracture of unspecified lumbar vertebra, subsequent encounter for fracture with routine healing: Secondary | ICD-10-CM | POA: Diagnosis not present

## 2018-11-26 DIAGNOSIS — M6281 Muscle weakness (generalized): Secondary | ICD-10-CM | POA: Diagnosis not present

## 2018-11-26 DIAGNOSIS — G3183 Dementia with Lewy bodies: Secondary | ICD-10-CM | POA: Diagnosis not present

## 2018-11-26 DIAGNOSIS — F0281 Dementia in other diseases classified elsewhere with behavioral disturbance: Secondary | ICD-10-CM | POA: Diagnosis not present

## 2018-11-26 NOTE — Telephone Encounter (Signed)
Review and advise. 

## 2018-11-28 ENCOUNTER — Telehealth: Payer: Self-pay | Admitting: Family Medicine

## 2018-11-28 DIAGNOSIS — G3183 Dementia with Lewy bodies: Secondary | ICD-10-CM | POA: Diagnosis not present

## 2018-11-28 DIAGNOSIS — F0281 Dementia in other diseases classified elsewhere with behavioral disturbance: Secondary | ICD-10-CM | POA: Diagnosis not present

## 2018-11-28 DIAGNOSIS — M6281 Muscle weakness (generalized): Secondary | ICD-10-CM | POA: Diagnosis not present

## 2018-11-28 DIAGNOSIS — S32000D Wedge compression fracture of unspecified lumbar vertebra, subsequent encounter for fracture with routine healing: Secondary | ICD-10-CM | POA: Diagnosis not present

## 2018-11-28 NOTE — Telephone Encounter (Signed)
Aware. 

## 2018-11-28 NOTE — Telephone Encounter (Signed)
Please advise on medication for oral thrush.

## 2018-11-28 NOTE — Telephone Encounter (Signed)
He should have Nystatin. If he is out of this, it can be refilled.

## 2018-12-03 DIAGNOSIS — S32000D Wedge compression fracture of unspecified lumbar vertebra, subsequent encounter for fracture with routine healing: Secondary | ICD-10-CM | POA: Diagnosis not present

## 2018-12-03 DIAGNOSIS — G3183 Dementia with Lewy bodies: Secondary | ICD-10-CM | POA: Diagnosis not present

## 2018-12-03 DIAGNOSIS — M6281 Muscle weakness (generalized): Secondary | ICD-10-CM | POA: Diagnosis not present

## 2018-12-03 DIAGNOSIS — F0281 Dementia in other diseases classified elsewhere with behavioral disturbance: Secondary | ICD-10-CM | POA: Diagnosis not present

## 2018-12-04 ENCOUNTER — Other Ambulatory Visit: Payer: Self-pay | Admitting: Family Medicine

## 2018-12-11 DIAGNOSIS — M6281 Muscle weakness (generalized): Secondary | ICD-10-CM | POA: Diagnosis not present

## 2018-12-11 DIAGNOSIS — F0281 Dementia in other diseases classified elsewhere with behavioral disturbance: Secondary | ICD-10-CM | POA: Diagnosis not present

## 2018-12-11 DIAGNOSIS — S32000D Wedge compression fracture of unspecified lumbar vertebra, subsequent encounter for fracture with routine healing: Secondary | ICD-10-CM | POA: Diagnosis not present

## 2018-12-11 DIAGNOSIS — G3183 Dementia with Lewy bodies: Secondary | ICD-10-CM | POA: Diagnosis not present

## 2018-12-13 ENCOUNTER — Other Ambulatory Visit: Payer: Self-pay

## 2018-12-13 ENCOUNTER — Telehealth (INDEPENDENT_AMBULATORY_CARE_PROVIDER_SITE_OTHER): Payer: Medicare Other | Admitting: Family Medicine

## 2018-12-13 DIAGNOSIS — S32000G Wedge compression fracture of unspecified lumbar vertebra, subsequent encounter for fracture with delayed healing: Secondary | ICD-10-CM

## 2018-12-13 DIAGNOSIS — G3183 Dementia with Lewy bodies: Secondary | ICD-10-CM | POA: Diagnosis not present

## 2018-12-13 DIAGNOSIS — F0281 Dementia in other diseases classified elsewhere with behavioral disturbance: Secondary | ICD-10-CM | POA: Diagnosis not present

## 2018-12-13 DIAGNOSIS — G20 Parkinson's disease: Secondary | ICD-10-CM

## 2018-12-13 DIAGNOSIS — F02818 Dementia in other diseases classified elsewhere, unspecified severity, with other behavioral disturbance: Secondary | ICD-10-CM

## 2018-12-13 DIAGNOSIS — M6281 Muscle weakness (generalized): Secondary | ICD-10-CM | POA: Diagnosis not present

## 2018-12-13 MED ORDER — METFORMIN HCL 1000 MG PO TABS: 1000.0000 mg | ORAL_TABLET | Freq: Every day | ORAL | 1 refills | Status: DC

## 2018-12-13 MED ORDER — FLUCONAZOLE 100 MG PO TABS: ORAL_TABLET | ORAL | 0 refills | Status: DC

## 2018-12-13 NOTE — Progress Notes (Signed)
Subjective:    Patient ID: Jon [name], male    DOB: Mar 28, 1946, 73 y.o.   MRN: [id number]   HPI: Jon [name] is a 73 y.o. male presenting for face to face visit for physical therapy through home health. Pt. Participates with therapist, but reluctant to perform between PT sessions. This is felt to be related to dementia according to his wife, who gives much of the history. Pt. Recommits to work with wife between visits. Currently very weak, unable to stand - when he stands his knees are bent, then they start shaking. Can only stand for a few seconds. Can not take a step. Uses gait belt for transfers.   Diarrhea subsided. Not eating much. Decreased recently. No elevated or low glucose due to small intake and weight loss. Not using megace. Only up for 1 1/2 hours then he is wiped out. Then sleeps the rest of the day and night.   Afebrile,  O2 95 HR 80 BP 100/70   [facility] 2/[facility] 08/23/2018 05/03/2018 03/12/2018 02/06/2018 09/07/2017  Decreased Interest 0 0 0 0 0  Down, Depressed, Hopeless 0 0 0 0 0  PHQ - 2 Score 0 0 0 0 0  Altered sleeping - - - - -  Tired, decreased energy - - - - -  Change in appetite - - - - -  Feeling bad or failure about yourself  - - - - -  Trouble concentrating - - - - -  Moving slowly or fidgety/restless - - - - -  Suicidal thoughts - - - - -  PHQ-9 Score - - - - -     Relevant past medical, surgical, family and social history reviewed and updated as indicated.  Interim medical history since our last visit reviewed. Allergies and medications reviewed and updated.  ROS:  Review of Systems  Constitutional: Positive for activity change (much less and declining) and appetite change (decreased). Negative for fever.  HENT: Negative.   Respiratory: Negative for shortness of breath.   Cardiovascular: Negative for chest pain.  Gastrointestinal: Negative for abdominal pain.  Musculoskeletal: Positive for gait problem. Negative for arthralgias.  Skin:  Negative for rash.  Neurological: Positive for weakness.  Psychiatric/Behavioral: Positive for confusion.     Social History   Tobacco Use  Smoking Status Former Smoker  . Packs/day: 1.00  . Years: 40.00  . Pack years: 40.00  . Types: Cigarettes  . Start date: 07/03/1966  . Quit date: 02/06/2009  . Years since quitting: 9.8  Smokeless Tobacco Never Used       Objective:     Wt Readings from Last 3 Encounters:  08/12/18 178 lb 9.2 oz (81 kg)  05/03/18 178 lb 12.8 oz (81.1 kg)  04/08/18 195 lb (88.5 kg)     Exam deferred. Pt. Harboring due to COVID 19. Phone visit performed.   Assessment & Plan:   1. Primary Parkinsonism (HCC)   2. Lewy body dementia with behavioral disturbance (HCC)   3. Compression fracture of lumbar vertebra with delayed healing, unspecified lumbar vertebral level, subsequent encounter   4. Muscle weakness (generalized)     Meds ordered this encounter  Medications  . metFORMIN (GLUCOPHAGE) 1000 MG tablet    Sig: Take 1 tablet (1,000 mg total) by mouth daily with breakfast.    Dispense:  90 tablet    Refill:  1  . fluconazole (DIFLUCAN) 100 MG tablet    Sig: Take two with first dose. Then  starting the next day take one daily until all are taken.    Dispense:  15 tablet    Refill:  0    No orders of the defined types were placed in this encounter.     Diagnoses and all orders for this visit:  Primary Parkinsonism ([place])  Lewy body dementia with behavioral disturbance ([place])  Compression fracture of lumbar vertebra with delayed healing, unspecified lumbar vertebral level, subsequent encounter  Muscle weakness (generalized)  Other orders -     metFORMIN (GLUCOPHAGE) 1000 MG tablet; Take 1 tablet (1,000 mg total) by mouth daily with breakfast. -     fluconazole (DIFLUCAN) 100 MG tablet; Take two with first dose. Then starting the next day take one daily until all are taken.    Virtual Visit via telephone Note  I discussed the  limitations, risks, security and privacy concerns of performing an evaluation and management service by live video and the availability of in person appointments. The patient was identified with two identifiers. Pt.expressed understanding and agreed to proceed. Pt. And his wife are at home. Dr. Livia Snellen is in his office.  Follow Up Instructions:   I discussed the assessment and treatment plan with the patient. The patient was provided an opportunity to ask questions and all were answered. The patient agreed with the plan and demonstrated an understanding of the instructions.   The patient was advised to call back or seek an in-person evaluation if the symptoms worsen or if the condition fails to improve as anticipated.   Total minutes including chart review and phone contact time: 43   Follow up plan: Return in about 6 weeks (around 01/24/2019), or if symptoms worsen or fail to improve.  Claretta Fraise, MD [place]

## 2018-12-14 ENCOUNTER — Encounter: Payer: Self-pay | Admitting: Family Medicine

## 2018-12-15 DIAGNOSIS — M6281 Muscle weakness (generalized): Secondary | ICD-10-CM | POA: Diagnosis not present

## 2018-12-15 DIAGNOSIS — F0281 Dementia in other diseases classified elsewhere with behavioral disturbance: Secondary | ICD-10-CM | POA: Diagnosis not present

## 2018-12-15 DIAGNOSIS — G3183 Dementia with Lewy bodies: Secondary | ICD-10-CM | POA: Diagnosis not present

## 2018-12-15 DIAGNOSIS — S32000D Wedge compression fracture of unspecified lumbar vertebra, subsequent encounter for fracture with routine healing: Secondary | ICD-10-CM | POA: Diagnosis not present

## 2018-12-18 ENCOUNTER — Other Ambulatory Visit: Payer: Self-pay

## 2018-12-18 ENCOUNTER — Ambulatory Visit (INDEPENDENT_AMBULATORY_CARE_PROVIDER_SITE_OTHER): Payer: Medicare Other

## 2018-12-18 DIAGNOSIS — S32000D Wedge compression fracture of unspecified lumbar vertebra, subsequent encounter for fracture with routine healing: Secondary | ICD-10-CM | POA: Diagnosis not present

## 2018-12-18 DIAGNOSIS — G3183 Dementia with Lewy bodies: Secondary | ICD-10-CM

## 2018-12-18 DIAGNOSIS — M6281 Muscle weakness (generalized): Secondary | ICD-10-CM

## 2018-12-18 DIAGNOSIS — F0281 Dementia in other diseases classified elsewhere with behavioral disturbance: Secondary | ICD-10-CM | POA: Diagnosis not present

## 2018-12-19 DIAGNOSIS — F0281 Dementia in other diseases classified elsewhere with behavioral disturbance: Secondary | ICD-10-CM | POA: Diagnosis not present

## 2018-12-19 DIAGNOSIS — M6281 Muscle weakness (generalized): Secondary | ICD-10-CM | POA: Diagnosis not present

## 2018-12-19 DIAGNOSIS — G3183 Dementia with Lewy bodies: Secondary | ICD-10-CM | POA: Diagnosis not present

## 2018-12-19 DIAGNOSIS — S32000D Wedge compression fracture of unspecified lumbar vertebra, subsequent encounter for fracture with routine healing: Secondary | ICD-10-CM | POA: Diagnosis not present

## 2018-12-24 DIAGNOSIS — S32000D Wedge compression fracture of unspecified lumbar vertebra, subsequent encounter for fracture with routine healing: Secondary | ICD-10-CM | POA: Diagnosis not present

## 2018-12-24 DIAGNOSIS — M6281 Muscle weakness (generalized): Secondary | ICD-10-CM | POA: Diagnosis not present

## 2018-12-24 DIAGNOSIS — F0281 Dementia in other diseases classified elsewhere with behavioral disturbance: Secondary | ICD-10-CM | POA: Diagnosis not present

## 2018-12-24 DIAGNOSIS — G3183 Dementia with Lewy bodies: Secondary | ICD-10-CM | POA: Diagnosis not present

## 2018-12-31 DIAGNOSIS — F0281 Dementia in other diseases classified elsewhere with behavioral disturbance: Secondary | ICD-10-CM | POA: Diagnosis not present

## 2018-12-31 DIAGNOSIS — G3183 Dementia with Lewy bodies: Secondary | ICD-10-CM | POA: Diagnosis not present

## 2018-12-31 DIAGNOSIS — M6281 Muscle weakness (generalized): Secondary | ICD-10-CM | POA: Diagnosis not present

## 2018-12-31 DIAGNOSIS — S32000D Wedge compression fracture of unspecified lumbar vertebra, subsequent encounter for fracture with routine healing: Secondary | ICD-10-CM | POA: Diagnosis not present

## 2019-01-06 ENCOUNTER — Telehealth: Payer: Self-pay | Admitting: Family Medicine

## 2019-01-06 DIAGNOSIS — G3183 Dementia with Lewy bodies: Secondary | ICD-10-CM | POA: Diagnosis not present

## 2019-01-06 DIAGNOSIS — M6281 Muscle weakness (generalized): Secondary | ICD-10-CM | POA: Diagnosis not present

## 2019-01-06 DIAGNOSIS — S32000D Wedge compression fracture of unspecified lumbar vertebra, subsequent encounter for fracture with routine healing: Secondary | ICD-10-CM | POA: Diagnosis not present

## 2019-01-06 DIAGNOSIS — F0281 Dementia in other diseases classified elsewhere with behavioral disturbance: Secondary | ICD-10-CM | POA: Diagnosis not present

## 2019-01-06 NOTE — Telephone Encounter (Signed)
Patient has a follow up televisit with wife ,appointment scheduled.

## 2019-01-07 ENCOUNTER — Other Ambulatory Visit: Payer: Self-pay

## 2019-01-07 ENCOUNTER — Encounter: Payer: Self-pay | Admitting: Family Medicine

## 2019-01-07 ENCOUNTER — Ambulatory Visit (INDEPENDENT_AMBULATORY_CARE_PROVIDER_SITE_OTHER): Payer: Medicare Other | Admitting: Family Medicine

## 2019-01-07 DIAGNOSIS — R278 Other lack of coordination: Secondary | ICD-10-CM

## 2019-01-07 DIAGNOSIS — F0281 Dementia in other diseases classified elsewhere with behavioral disturbance: Secondary | ICD-10-CM | POA: Diagnosis not present

## 2019-01-07 DIAGNOSIS — Z794 Long term (current) use of insulin: Secondary | ICD-10-CM

## 2019-01-07 DIAGNOSIS — G3183 Dementia with Lewy bodies: Secondary | ICD-10-CM

## 2019-01-07 DIAGNOSIS — IMO0002 Reserved for concepts with insufficient information to code with codable children: Secondary | ICD-10-CM

## 2019-01-07 DIAGNOSIS — E118 Type 2 diabetes mellitus with unspecified complications: Secondary | ICD-10-CM | POA: Diagnosis not present

## 2019-01-07 DIAGNOSIS — E1165 Type 2 diabetes mellitus with hyperglycemia: Secondary | ICD-10-CM | POA: Diagnosis not present

## 2019-01-07 NOTE — Progress Notes (Signed)
Subjective:    Patient ID: Jon [name], male    DOB: [birth year], 73 y.o.   MRN: [id number]   HPI: Jon [name] is a 73 y.o. male presenting for glucose going up. Was 139 fasting yesterday. Eating better. Not as much as wife would like. Loves sandwiches. Using whole wheat bread.  Not giving the megace as much since he started eating. Has had nothing over 140 and no lows.  Wife concerned that glucose fasting numbaers are now too high.   Depression screen [facility] 2/9 08/23/2018 05/03/2018 03/12/2018 02/06/2018 09/07/2017  Decreased Interest 0 0 0 0 0  Down, Depressed, Hopeless 0 0 0 0 0  PHQ - 2 Score 0 0 0 0 0  Altered sleeping - - - - -  Tired, decreased energy - - - - -  Change in appetite - - - - -  Feeling bad or failure about yourself  - - - - -  Trouble concentrating - - - - -  Moving slowly or fidgety/restless - - - - -  Suicidal thoughts - - - - -  PHQ-9 Score - - - - -     Relevant past medical, surgical, family and social history reviewed and updated as indicated.  Interim medical history since our last visit reviewed. Allergies and medications reviewed and updated.  ROS:  Review of Systems  Constitutional: Positive for appetite change (improved) and fatigue. Negative for diaphoresis and fever.  HENT: Negative.   Gastrointestinal: Positive for diarrhea (intermittent, possibly related to megace.). Negative for abdominal pain, nausea and vomiting.  Neurological: Positive for weakness (unable to stand on his own.).     Social History   Tobacco Use  Smoking Status Former Smoker  . Packs/day: 1.00  . Years: 40.00  . Pack years: 40.00  . Types: Cigarettes  . Start date: 07/03/1966  . Quit date: 02/06/2009  . Years since quitting: 9.9  Smokeless Tobacco Never Used       Objective:     Wt Readings from Last 3 Encounters:  08/12/18 178 lb 9.2 oz (81 kg)  05/03/18 178 lb 12.8 oz (81.1 kg)  04/08/18 195 lb (88.5 kg)     Exam deferred. Pt. Harboring due to COVID 19.  Phone visit performed.   Assessment & Plan:   1. Uncontrolled type 2 diabetes mellitus with complication, with long-term current use of insulin ([name])   2. Lewy body dementia with behavioral disturbance ([name])   3. Muscular incoordination     Glucose isn't  High enough to be a concern. Wife reasurred. Continue to check daily. Let me know if it continues to climb, 160+.      Diagnoses and all orders for this visit:  Uncontrolled type 2 diabetes mellitus with complication, with long-term current use of insulin ([place])  Lewy body dementia with behavioral disturbance (HCC)  Muscular incoordination    Virtual Visit via telephone Note  I discussed the limitations, risks, security and privacy concerns of performing an evaluation and management service by telephone and the availability of in person appointments. The patient was identified with two identifiers. Pt.expressed understanding and agreed to proceed. Pt. Is at home. Dr. Livia Snellen is in his office.  Follow Up Instructions:   I discussed the assessment and treatment plan with the patient. The patient was provided an opportunity to ask questions and all were answered. The patient agreed with the plan and demonstrated an understanding of the instructions.   The patient was advised to  call back or seek an in-person evaluation if the symptoms worsen or if the condition fails to improve as anticipated.   Total minutes including chart review and phone contact time: 15   Follow up plan: Return in about 1 month (around 02/07/2019).  Mechele ClaudeWarren Faraz Ponciano, MD [facility]

## 2019-01-10 ENCOUNTER — Ambulatory Visit (INDEPENDENT_AMBULATORY_CARE_PROVIDER_SITE_OTHER): Payer: Medicare Other | Admitting: Family Medicine

## 2019-01-10 ENCOUNTER — Encounter: Payer: Self-pay | Admitting: Family Medicine

## 2019-01-10 ENCOUNTER — Other Ambulatory Visit: Payer: Self-pay

## 2019-01-10 DIAGNOSIS — N3090 Cystitis, unspecified without hematuria: Secondary | ICD-10-CM

## 2019-01-10 LAB — URINALYSIS, COMPLETE
Bilirubin, UA: NEGATIVE
Glucose, UA: NEGATIVE
Ketones, UA: NEGATIVE
Leukocytes,UA: NEGATIVE
Nitrite, UA: NEGATIVE
Protein,UA: NEGATIVE
RBC, UA: NEGATIVE
Specific Gravity, UA: 1.02 (ref 1.005–1.030)
Urobilinogen, Ur: 2 mg/dL — ABNORMAL HIGH (ref 0.2–1.0)
pH, UA: 6.5 (ref 5.0–7.5)

## 2019-01-10 LAB — MICROSCOPIC EXAMINATION
Bacteria, UA: NONE SEEN
Epithelial Cells (non renal): NONE SEEN /hpf (ref 0–10)
RBC, Urine: NONE SEEN /hpf (ref 0–2)

## 2019-01-10 MED ORDER — SULFAMETHOXAZOLE-TRIMETHOPRIM 800-160 MG PO TABS: 1.0000 | ORAL_TABLET | Freq: Two times a day (BID) | ORAL | 0 refills | Status: AC

## 2019-01-10 NOTE — Progress Notes (Signed)
Virtual Visit via telephone Note Due to COVID-19, visit is conducted virtually and was requested by patient. This visit type was conducted due to national recommendations for restrictions regarding the COVID-19 Pandemic (e.g. social distancing) in an effort to limit this patient's exposure and mitigate transmission in our community. All issues noted in this document were discussed and addressed.  A physical exam was not performed with this format.   I connected with Jon [name] and his wife on 01/10/19 at 1050 by telephone and verified that I am speaking with the correct person using two identifiers. Jon [name] is currently located at home and family is currently with them during visit. The provider, Monia Pouch, FNP is located in their office at time of visit.  I discussed the limitations, risks, security and privacy concerns of performing an evaluation and management service by telephone and the availability of in person appointments. I also discussed with the patient that there may be a patient responsible charge related to this service. The patient expressed understanding and agreed to proceed.  Subjective:  Patient ID: Jon [name], male    DOB: 30-Mar-1946, 73 y.o.   MRN: [id number]  Chief Complaint:  Urinary Tract Infection   HPI: Jon [name] is a 73 y.o. male presenting on 01/10/2019 for Urinary Tract Infection   Wife reports three days of malodorous dark urine with frequency. She denies fever, chills, weakness, or increased confusion.   Urinary Tract Infection  This is a new problem. The current episode started in the past 7 days. There has been no fever. Associated symptoms include frequency. Pertinent negatives include no chills, discharge, flank pain, hematuria, hesitancy, nausea, possible pregnancy, sweats, urgency or vomiting. He has tried nothing for the symptoms.     Relevant past medical, surgical, family, and social history reviewed and updated as indicated.   Allergies and medications reviewed and updated.   Past Medical History:  Diagnosis Date  . AAA (abdominal aortic aneurysm) ([place])   . Alzheimer disease ([place])   . Anemia   . Ataxia   . Diabetes mellitus without complication ([place])   . History of gallstones   . Hyperlipidemia   . Hypertension   . Vitamin D deficiency     Past Surgical History:  Procedure Laterality Date  . ERCP    . INGUINAL HERNIA REPAIR    . TONSILLECTOMY      Social History   Socioeconomic History  . Marital status: Married    Spouse name: Not on file  . Number of children: Not on file  . Years of education: Not on file  . Highest education level: Not on file  Occupational History  . Not on file  Social Needs  . Financial resource strain: Not on file  . Food insecurity    Worry: Not on file    Inability: Not on file  . Transportation needs    Medical: Not on file    Non-medical: Not on file  Tobacco Use  . Smoking status: Former Smoker    Packs/day: 1.00    Years: 40.00    Pack years: 40.00    Types: Cigarettes    Start date: 07/03/1966    Quit date: 02/06/2009    Years since quitting: 9.9  . Smokeless tobacco: Never Used  Substance and Sexual Activity  . Alcohol use: No    Alcohol/week: 0.0 standard drinks  . Drug use: No  . Sexual activity: Yes  Lifestyle  . Physical activity  Days per week: Not on file    Minutes per session: Not on file  . Stress: Not on file  Relationships  . Social Herbalist on phone: Not on file    Gets together: Not on file    Attends religious service: Not on file    Active member of club or organization: Not on file    Attends meetings of clubs or organizations: Not on file    Relationship status: Not on file  . Intimate partner violence    Fear of current or ex partner: Not on file    Emotionally abused: Not on file    Physically abused: Not on file    Forced sexual activity: Not on file  Other Topics Concern  . Not on file  Social  History Narrative  . Not on file    Outpatient Encounter Medications as of 01/10/2019  Medication Sig  . Acetaminophen (TYLENOL ARTHRITIS EXT RELIEF PO) Take by mouth.  [name] aspirin 81 MG tablet Take 81 mg by mouth daily.  . blood glucose meter kit and supplies Patient requests a Freestyle Meter. Use up to four times daily as directed. (FOR ICD-10 E10.9, E11.9).  [name] Blood Glucose Monitoring Suppl (FREESTYLE FREEDOM LITE) w/Device KIT USE TO CHECK FASTING AND 2 HOURS AFTER BIGGEST MEAL Dx E11.8  . Cholecalciferol (VITAMIN D) 50 MCG (2000 UT) CAPS Take 1 capsule by mouth daily.  . citalopram (CELEXA) 10 MG tablet Take 10 mg by mouth daily.  . clotrimazole-betamethasone (LOTRISONE) cream Apply 1 application topically 2 (two) times daily. To affected areas until rash clears  . fluconazole (DIFLUCAN) 100 MG tablet Take two with first dose. Then starting the next day take one daily until all are taken.  [name] glucose blood (FREESTYLE LITE) test strip USE TO CHECK FASTING AND 2 HOURS AFTER BIGGEST MEAL  . megestrol (MEGACE) 400 MG/10ML suspension Take 10 mLs (400 mg total) by mouth 2 (two) times daily. For appetite stimulation  . metFORMIN (GLUCOPHAGE) 1000 MG tablet Take 1 tablet (1,000 mg total) by mouth daily with breakfast.  . omeprazole (PRILOSEC) 20 MG capsule TAKE 1 CAPSULE DAILY  . ondansetron (ZOFRAN-ODT) 8 MG disintegrating tablet Take 1 tablet (8 mg total) by mouth every 6 (six) hours as needed for nausea or vomiting.  . rivastigmine (EXELON) 4.5 MG capsule TAKE 1 CAPSULE TWICE A DAY  . sulfamethoxazole-trimethoprim (BACTRIM DS) 800-160 MG tablet Take 1 tablet by mouth 2 (two) times daily for 7 days.  Haig Prophet COMFORT PEN NEEDLES 32G X 6 MM MISC USE AS DIRECTED   No facility-administered encounter medications on file as of 01/10/2019.     Allergies  Allergen Reactions  . Donepezil Nausea And Vomiting  . Namenda [Memantine Hcl] Other (See Comments)    Sleeps all the time  . Phenergan  [Promethazine Hcl] Other (See Comments)    Aggressive      Review of Systems  Unable to perform ROS: Dementia (ROS per wife)  Constitutional: Negative for chills, fatigue and fever.  Gastrointestinal: Negative for nausea and vomiting.  Genitourinary: Positive for frequency. Negative for flank pain, hematuria, hesitancy and urgency.  Neurological: Negative for weakness.  Psychiatric/Behavioral: Positive for confusion (baseline).  All other systems reviewed and are negative.        Observations/Objective: No vital signs or physical exam, this was a telephone or virtual health encounter.  Pt alert and oriented, answers all questions appropriately, and able to speak in full sentences.  Assessment and Plan: Ricke was seen today for urinary tract infection.  Diagnoses and all orders for this visit:  Cystitis Reported symptoms consistent with acute cystitis. Wife will collect and bring specimen to office for testing. No fever, chills, or increased confusion. Previous urine culture susceptible to Bactrim. Will empirically treat with below and change if culture warrants. Will avoid Cipro due to history of AAA.  -     sulfamethoxazole-trimethoprim (BACTRIM DS) 800-160 MG tablet; Take 1 tablet by mouth 2 (two) times daily for 7 days. -     Urinalysis, Complete -     Urine Culture     Follow Up Instructions: Return if symptoms worsen or fail to improve.    I discussed the assessment and treatment plan with the patient. The patient was provided an opportunity to ask questions and all were answered. The patient agreed with the plan and demonstrated an understanding of the instructions.   The patient was advised to call back or seek an in-person evaluation if the symptoms worsen or if the condition fails to improve as anticipated.  The above assessment and management plan was discussed with the patient. The patient verbalized understanding of and has agreed to the management plan.  Patient is aware to call the clinic if symptoms persist or worsen. Patient is aware when to return to the clinic for a follow-up visit. Patient educated on when it is appropriate to go to the emergency department.    I provided 15 minutes of non-face-to-face time during this encounter. The call started at 1050. The call ended at 1105. The other time was used for coordination of care.    Monia Pouch, FNP-C [facility] [address] [place], [place] [address] [phone]

## 2019-01-11 LAB — URINE CULTURE

## 2019-01-13 ENCOUNTER — Telehealth: Payer: Self-pay | Admitting: Family Medicine

## 2019-01-13 DIAGNOSIS — F0281 Dementia in other diseases classified elsewhere with behavioral disturbance: Secondary | ICD-10-CM | POA: Diagnosis not present

## 2019-01-13 DIAGNOSIS — G3183 Dementia with Lewy bodies: Secondary | ICD-10-CM | POA: Diagnosis not present

## 2019-01-13 DIAGNOSIS — S32000D Wedge compression fracture of unspecified lumbar vertebra, subsequent encounter for fracture with routine healing: Secondary | ICD-10-CM | POA: Diagnosis not present

## 2019-01-13 DIAGNOSIS — M6281 Muscle weakness (generalized): Secondary | ICD-10-CM | POA: Diagnosis not present

## 2019-01-13 NOTE — Telephone Encounter (Signed)
Wife aware that patient does not need to take an antibiotic.  Patient needs to increase water intake and recheck urine in 2 weeks.

## 2019-01-14 DIAGNOSIS — F0281 Dementia in other diseases classified elsewhere with behavioral disturbance: Secondary | ICD-10-CM | POA: Diagnosis not present

## 2019-01-14 DIAGNOSIS — S32000D Wedge compression fracture of unspecified lumbar vertebra, subsequent encounter for fracture with routine healing: Secondary | ICD-10-CM | POA: Diagnosis not present

## 2019-01-14 DIAGNOSIS — G3183 Dementia with Lewy bodies: Secondary | ICD-10-CM | POA: Diagnosis not present

## 2019-01-14 DIAGNOSIS — M6281 Muscle weakness (generalized): Secondary | ICD-10-CM | POA: Diagnosis not present

## 2019-01-15 DIAGNOSIS — M6281 Muscle weakness (generalized): Secondary | ICD-10-CM | POA: Diagnosis not present

## 2019-01-15 DIAGNOSIS — S32000D Wedge compression fracture of unspecified lumbar vertebra, subsequent encounter for fracture with routine healing: Secondary | ICD-10-CM | POA: Diagnosis not present

## 2019-01-15 DIAGNOSIS — F0281 Dementia in other diseases classified elsewhere with behavioral disturbance: Secondary | ICD-10-CM | POA: Diagnosis not present

## 2019-01-15 DIAGNOSIS — G3183 Dementia with Lewy bodies: Secondary | ICD-10-CM | POA: Diagnosis not present

## 2019-01-20 DIAGNOSIS — M6281 Muscle weakness (generalized): Secondary | ICD-10-CM | POA: Diagnosis not present

## 2019-01-20 DIAGNOSIS — S32000D Wedge compression fracture of unspecified lumbar vertebra, subsequent encounter for fracture with routine healing: Secondary | ICD-10-CM | POA: Diagnosis not present

## 2019-01-20 DIAGNOSIS — F0281 Dementia in other diseases classified elsewhere with behavioral disturbance: Secondary | ICD-10-CM | POA: Diagnosis not present

## 2019-01-20 DIAGNOSIS — G3183 Dementia with Lewy bodies: Secondary | ICD-10-CM | POA: Diagnosis not present

## 2019-01-21 DIAGNOSIS — F0280 Dementia in other diseases classified elsewhere without behavioral disturbance: Secondary | ICD-10-CM | POA: Diagnosis not present

## 2019-01-21 DIAGNOSIS — G301 Alzheimer's disease with late onset: Secondary | ICD-10-CM | POA: Diagnosis not present

## 2019-01-21 DIAGNOSIS — R29898 Other symptoms and signs involving the musculoskeletal system: Secondary | ICD-10-CM | POA: Diagnosis not present

## 2019-01-22 ENCOUNTER — Telehealth: Payer: Self-pay | Admitting: Family Medicine

## 2019-01-22 MED ORDER — FLUCONAZOLE 100 MG PO TABS: ORAL_TABLET | ORAL | 0 refills | Status: DC

## 2019-01-22 NOTE — Telephone Encounter (Signed)
Aware. 

## 2019-01-22 NOTE — Telephone Encounter (Signed)
There is not necessarily anything to prevent it, 1 of the other treatments is a mouthwash but there is not necessarily preventative mouthwash, they can use Listerine or another over-the-counter mouthwash to try and keep things healthy and can do salt water gargles

## 2019-01-22 NOTE — Telephone Encounter (Signed)
The only thing I could see that possibly causes thrush so frequently that is on his list his diabetes because it does lower his immune system although this is something that I think he should be evaluated for further, specifically speaking to his immune system.  Go ahead and refill his Diflucan that is on his list.

## 2019-01-22 NOTE — Telephone Encounter (Signed)
Wife aware and verbalizes understanding per dpr. Rx sent. Wife would like to know if there is a preventative mouth wash? If so pease send to [facility].

## 2019-01-23 DIAGNOSIS — G3183 Dementia with Lewy bodies: Secondary | ICD-10-CM | POA: Diagnosis not present

## 2019-01-23 DIAGNOSIS — M6281 Muscle weakness (generalized): Secondary | ICD-10-CM | POA: Diagnosis not present

## 2019-01-23 DIAGNOSIS — S32000D Wedge compression fracture of unspecified lumbar vertebra, subsequent encounter for fracture with routine healing: Secondary | ICD-10-CM | POA: Diagnosis not present

## 2019-01-23 DIAGNOSIS — F0281 Dementia in other diseases classified elsewhere with behavioral disturbance: Secondary | ICD-10-CM | POA: Diagnosis not present

## 2019-01-28 DIAGNOSIS — M6281 Muscle weakness (generalized): Secondary | ICD-10-CM | POA: Diagnosis not present

## 2019-01-28 DIAGNOSIS — F0281 Dementia in other diseases classified elsewhere with behavioral disturbance: Secondary | ICD-10-CM | POA: Diagnosis not present

## 2019-01-28 DIAGNOSIS — S32000D Wedge compression fracture of unspecified lumbar vertebra, subsequent encounter for fracture with routine healing: Secondary | ICD-10-CM | POA: Diagnosis not present

## 2019-01-28 DIAGNOSIS — G3183 Dementia with Lewy bodies: Secondary | ICD-10-CM | POA: Diagnosis not present

## 2019-01-30 ENCOUNTER — Other Ambulatory Visit: Payer: Self-pay | Admitting: Family Medicine

## 2019-02-04 ENCOUNTER — Other Ambulatory Visit: Payer: Self-pay

## 2019-02-05 ENCOUNTER — Ambulatory Visit (INDEPENDENT_AMBULATORY_CARE_PROVIDER_SITE_OTHER): Payer: Medicare Other | Admitting: Physician Assistant

## 2019-02-05 ENCOUNTER — Encounter: Payer: Self-pay | Admitting: Physician Assistant

## 2019-02-05 VITALS — BP 102/75 | HR 103 | Temp 98.9°F

## 2019-02-05 DIAGNOSIS — M7581 Other shoulder lesions, right shoulder: Secondary | ICD-10-CM | POA: Diagnosis not present

## 2019-02-05 DIAGNOSIS — F0281 Dementia in other diseases classified elsewhere with behavioral disturbance: Secondary | ICD-10-CM | POA: Diagnosis not present

## 2019-02-05 DIAGNOSIS — S32000D Wedge compression fracture of unspecified lumbar vertebra, subsequent encounter for fracture with routine healing: Secondary | ICD-10-CM | POA: Diagnosis not present

## 2019-02-05 DIAGNOSIS — M6281 Muscle weakness (generalized): Secondary | ICD-10-CM | POA: Diagnosis not present

## 2019-02-05 DIAGNOSIS — G3183 Dementia with Lewy bodies: Secondary | ICD-10-CM | POA: Diagnosis not present

## 2019-02-05 MED ORDER — PREDNISONE 10 MG (48) PO TBPK: ORAL_TABLET | ORAL | 0 refills | Status: DC

## 2019-02-05 NOTE — Patient Instructions (Signed)
Shoulder Range of Motion Exercises Shoulder range of motion (ROM) exercises are done to keep the shoulder moving freely or to increase movement. They are often recommended for people who have shoulder pain or stiffness or who are recovering from a shoulder surgery. Phase 1 exercises When you are able, do this exercise 1-2 times per day for 30-60 seconds in each direction, or as directed by your health care provider. Pendulum exercise To do this exercise while sitting: 1. Sit in a chair or at the edge of your bed with your feet flat on the floor. 2. Let your affected arm hang down in front of you over the edge of the bed or chair. 3. Relax your shoulder, arm, and hand. 4. Rock your body so your arm gently swings in small circles. You can also use your unaffected arm to start the motion. 5. Repeat changing the direction of the circles, swinging your arm left and right, and swinging your arm forward and back. To do this exercise while standing: 1. Stand next to a sturdy chair or table, and hold on to it with your hand on your unaffected side. 2. Bend forward at the waist. 3. Bend your knees slightly. 4. Relax your shoulder, arm, and hand. 5. While keeping your shoulder relaxed, use body motion to swing your arm in small circles. 6. Repeat changing the direction of the circles, swinging your arm left and right, and swinging your arm forward and back. 7. Between exercises, stand up tall and take a short break to relax your lower back.  Phase 2 exercises Do these exercises 1-2 times per day or as told by your health care provider. Hold each stretch for 30 seconds, and repeat 3 times. Do the exercises with one or both arms as instructed by your health care provider. For these exercises, sit at a table with your hand and arm supported by the table. A chair that slides easily or has wheels can be helpful. External rotation 1. Turn your chair so that your affected side is nearest to the table. 2.  Place your forearm on the table to your side. Bend your elbow about 90 at the elbow (right angle) and place your hand palm facing down on the table. Your elbow should be about 6 inches away from your side. 3. Keeping your arm on the table, lean your body forward. Abduction 1. Turn your chair so that your affected side is nearest to the table. 2. Place your forearm and hand on the table so that your thumb points toward the ceiling and your arm is straight out to your side. 3. Slide your hand out to the side and away from you, using your unaffected arm to do the work. 4. To increase the stretch, you can slide your chair away from the table. Flexion: forward stretch 1. Sit facing the table. Place your hand and elbow on the table in front of you. 2. Slide your hand forward and away from you, using your unaffected arm to do the work. 3. To increase the stretch, you can slide your chair backward. Phase 3 exercises Do these exercises 1-2 times per day or as told by your health care provider. Hold each stretch for 30 seconds, and repeat 3 times. Do the exercises with one or both arms as instructed by your health care provider. Cross-body stretch: posterior capsule stretch 1. Lift your arm straight out in front of you. 2. Bend your arm 90 at the elbow (right angle) so your forearm   moves across your body. 3. Use your other arm to gently pull the elbow across your body, toward your other shoulder. Wall climbs 1. Stand with your affected arm extended out to the side with your hand resting on a door frame. 2. Slide your hand slowly up the door frame. 3. To increase the stretch, step through the door frame. Keep your body upright and do not lean. Wand exercises You will need a cane, a piece of PVC pipe, or a sturdy wooden dowel for wand exercises. Flexion To do this exercise while standing: 1. Hold the wand with both of your hands, palms down. 2. Using the other arm to help, lift your arms up and over  your head, if able. 3. Push upward with your other arm to gently increase the stretch. To do this exercise while lying down: 1. Lie on your back with your elbows resting on the floor and the wand in both your hands. Your hands will be palm down, or pointing toward your feet. 2. Lift your hands toward the ceiling, using your unaffected arm to help if needed. 3. Bring your arms overhead as able, using your unaffected arm to help if needed. Internal rotation 1. Stand while holding the wand behind you with both hands. Your unaffected arm should be extended above your head with the arm of the affected side extended behind you at the level of your waist. The wand should be pointing straight up and down as you hold it. 2. Slowly pull the wand up behind your back by straightening the elbow of your unaffected arm and bending the elbow of your affected arm. External rotation 1. Lie on your back with your affected upper arm supported on a small pillow or rolled towel. When you first do this exercise, keep your upper arm close to your body. Over time, bring your arm up to a 90 angle out to the side. 2. Hold the wand across your stomach and with both hands palm up. Your elbow on your affected side should be bent at a 90 angle. 3. Use your unaffected side to help push your forearm away from you and toward the floor. Keep your elbow on your affected side bent at a 90 angle. Contact a health care provider if you have:  New or increasing pain.  New numbness, tingling, weakness, or discoloration in your arm or hand. This information is not intended to replace advice given to you by your health care provider. Make sure you discuss any questions you have with your health care provider. Document Released: 03/18/2003 Document Revised: 08/01/2017 Document Reviewed: 08/01/2017 Elsevier Patient Education  2020 [employer]  

## 2019-02-09 NOTE — Progress Notes (Signed)
BP 102/75   Pulse (!) 103   Temp 98.9 F (37.2 C) (Temporal)    Subjective:    Patient ID: Jon [name], male    DOB: December 25, 1945, 73 y.o.   MRN: [id number]  HPI: Jon [name] is a 73 y.o. male presenting on 02/05/2019 for Arm Pain (right x 2 weeks)  The patient reports that his right shoulder has been getting a lot worse.  He has difficulty with raising it even above 90 degrees.  He did not have any specific injury he has had lots of use of the shoulder over time.  A lot of his joints have limited range of motion and he is encouraged by his wife to do range of motion exercises while he is in the bed.  He is wheelchair-bound and bedbound.  Past Medical History:  Diagnosis Date  . AAA (abdominal aortic aneurysm) ([place])   . Alzheimer disease ([place])   . Anemia   . Ataxia   . Diabetes mellitus without complication ([place])   . History of gallstones   . Hyperlipidemia   . Hypertension   . Vitamin D deficiency    Relevant past medical, surgical, family and social history reviewed and updated as indicated. Interim medical history since our last visit reviewed. Allergies and medications reviewed and updated. DATA REVIEWED: CHART IN EPIC  Family History reviewed for pertinent findings.  Review of Systems  Constitutional: Negative.  Negative for appetite change and fatigue.  Eyes: Negative for pain and visual disturbance.  Respiratory: Negative.  Negative for cough, chest tightness, shortness of breath and wheezing.   Cardiovascular: Negative.  Negative for chest pain, palpitations and leg swelling.  Gastrointestinal: Negative.  Negative for abdominal pain, diarrhea, nausea and vomiting.  Genitourinary: Negative.   Musculoskeletal: Positive for arthralgias.  Skin: Negative.  Negative for color change and rash.  Neurological: Negative.  Negative for weakness, numbness and headaches.    Allergies as of 02/05/2019      Reactions   Donepezil Nausea And Vomiting   Namenda [memantine Hcl]  Other (See Comments)   Sleeps all the time   Phenergan [promethazine Hcl] Other (See Comments)   Aggressive       Medication List       Accurate as of February 05, 2019 11:59 PM. If you have any questions, ask your nurse or doctor.        aspirin 81 MG tablet Take 81 mg by mouth daily.   blood glucose meter kit and supplies Patient requests a Freestyle Meter. Use up to four times daily as directed. (FOR ICD-10 E10.9, E11.9).   citalopram 10 MG tablet Commonly known as: CELEXA Take 10 mg by mouth daily.   clotrimazole-betamethasone cream Commonly known as: Lotrisone Apply 1 application topically 2 (two) times daily. To affected areas until rash clears   fluconazole 100 MG tablet Commonly known as: Diflucan Take two with first dose. Then starting the next day take one daily until all are taken.   FreeStyle Freedom Lite w/Device Kit USE TO CHECK FASTING AND 2 HOURS AFTER BIGGEST MEAL Dx E11.8   glucose blood test strip Commonly known as: FREESTYLE LITE USE TO CHECK FASTING AND 2 HOURS AFTER BIGGEST MEAL   megestrol 400 MG/10ML suspension Commonly known as: MEGACE Take 10 mLs (400 mg total) by mouth 2 (two) times daily. For appetite stimulation   metFORMIN 1000 MG tablet Commonly known as: GLUCOPHAGE Take 1 tablet (1,000 mg total) by mouth daily with breakfast.  omeprazole 20 MG capsule Commonly known as: PRILOSEC TAKE 1 CAPSULE DAILY   ondansetron 8 MG disintegrating tablet Commonly known as: ZOFRAN-ODT Take 1 tablet (8 mg total) by mouth [name] 6 (six) hours as needed for nausea or vomiting.   predniSONE 10 MG (48) Tbpk tablet Commonly known as: STERAPRED UNI-PAK 48 TAB Take as directed Started by: Terald Sleeper, PA-C   rivastigmine 4.5 MG capsule Commonly known as: EXELON TAKE 1 CAPSULE TWICE A DAY   Sure Comfort Pen Needles 32G X 6 MM Misc Generic drug: Insulin Pen Needle USE AS DIRECTED   TYLENOL ARTHRITIS EXT RELIEF PO Take by mouth.   Vitamin D 50  MCG (2000 UT) Caps Take 1 capsule by mouth daily.          Objective:    BP 102/75   Pulse (!) 103   Temp 98.9 F (37.2 C) (Temporal)   Allergies  Allergen Reactions  . Donepezil Nausea And Vomiting  . Namenda [Memantine Hcl] Other (See Comments)    Sleeps all the time  . Phenergan [Promethazine Hcl] Other (See Comments)    Aggressive      Wt Readings from Last 3 Encounters:  08/12/18 178 lb 9.2 oz (81 kg)  05/03/18 178 lb 12.8 oz (81.1 kg)  04/08/18 195 lb (88.5 kg)    Physical Exam Vitals signs and nursing note reviewed.  Constitutional:      General: He is not in acute distress.    Appearance: He is well-developed.  HENT:     Head: Normocephalic and atraumatic.  Eyes:     Conjunctiva/sclera: Conjunctivae normal.     Pupils: Pupils are equal, round, and reactive to light.  Cardiovascular:     Rate and Rhythm: Normal rate and regular rhythm.     Heart sounds: Normal heart sounds.  Pulmonary:     Effort: Pulmonary effort is normal. No respiratory distress.     Breath sounds: Normal breath sounds.  Skin:    General: Skin is warm and dry.  Psychiatric:        Behavior: Behavior normal.         Assessment & Plan:   1. Right rotator cuff tendonitis - predniSONE (STERAPRED UNI-PAK 48 TAB) 10 MG (48) TBPK tablet; Take as directed  Dispense: 48 tablet; Refill: 0   Continue all other maintenance medications as listed above.  Follow up plan: No follow-ups on file.  Educational handout given for shoulder exercises  Terald Sleeper PA-C [facility] [address]  [place], [place] [address] [phone]   02/09/2019, 10:16 PM

## 2019-02-11 DIAGNOSIS — F0281 Dementia in other diseases classified elsewhere with behavioral disturbance: Secondary | ICD-10-CM | POA: Diagnosis not present

## 2019-02-11 DIAGNOSIS — M6281 Muscle weakness (generalized): Secondary | ICD-10-CM | POA: Diagnosis not present

## 2019-02-11 DIAGNOSIS — S32000D Wedge compression fracture of unspecified lumbar vertebra, subsequent encounter for fracture with routine healing: Secondary | ICD-10-CM | POA: Diagnosis not present

## 2019-02-11 DIAGNOSIS — G3183 Dementia with Lewy bodies: Secondary | ICD-10-CM | POA: Diagnosis not present

## 2019-02-12 ENCOUNTER — Other Ambulatory Visit: Payer: Self-pay | Admitting: Family Medicine

## 2019-02-13 DIAGNOSIS — G3183 Dementia with Lewy bodies: Secondary | ICD-10-CM | POA: Diagnosis not present

## 2019-02-13 DIAGNOSIS — F0281 Dementia in other diseases classified elsewhere with behavioral disturbance: Secondary | ICD-10-CM | POA: Diagnosis not present

## 2019-02-13 DIAGNOSIS — M6281 Muscle weakness (generalized): Secondary | ICD-10-CM | POA: Diagnosis not present

## 2019-02-13 DIAGNOSIS — S32000D Wedge compression fracture of unspecified lumbar vertebra, subsequent encounter for fracture with routine healing: Secondary | ICD-10-CM | POA: Diagnosis not present

## 2019-02-14 ENCOUNTER — Ambulatory Visit (INDEPENDENT_AMBULATORY_CARE_PROVIDER_SITE_OTHER): Payer: Medicare Other | Admitting: *Deleted

## 2019-02-14 ENCOUNTER — Other Ambulatory Visit: Payer: Self-pay

## 2019-02-14 DIAGNOSIS — Z0000 Encounter for general adult medical examination without abnormal findings: Secondary | ICD-10-CM

## 2019-02-14 NOTE — Progress Notes (Signed)
MEDICARE ANNUAL WELLNESS VISIT  02/14/2019  Telephone Visit Disclaimer This Medicare AWV was conducted by telephone due to national recommendations for restrictions regarding the COVID-19 Pandemic (e.g. social distancing).  I verified, using two identifiers, that I am speaking with Jon [name] or their authorized healthcare agent. I discussed the limitations, risks, security, and privacy concerns of performing an evaluation and management service by telephone and the potential availability of an in-person appointment in the future. The patient expressed understanding and agreed to proceed.   Subjective:  Jon [name] is a 73 y.o. male patient of Stacks, Jon Gash, MD who had a Medicare Annual Wellness Visit today via telephone with his wife on the phone as well to help answer all questions. Jon [name] is Retired and lives with their spouse. he has 1 biologic child and 4 stepchildren. he reports that he is socially active and does interact with friends/family regularly. he is not physically active and enjoys watching The Twilight Zone on TV.  Patient Care Team: Jon Fraise, MD as PCP - General (Family Medicine) Jon Heading, MD as Consulting Physician (Neurology) Jon [name] as Consulting Physician (Neurology)  Advanced Directives 02/14/2019 08/13/2018 08/12/2018 01/01/2018 04/29/2015 02/06/2014  Does Patient Have a Medical Advance Directive? Yes Yes Yes Yes Yes Patient would like information  Type of Advance Directive Living will;Healthcare Power of Attorney Living will [place];Living will - [place];Living will -  Does patient want to make changes to medical advance directive? No - Patient declined No - Patient declined - - No - Patient declined -  Copy of [place] in Chart? No - copy requested - - - No - copy requested -  Would patient like information on creating a medical advance directive? - - - - - Advance directive packet given    Hospital Utilization Over the Past 12 Months: # of hospitalizations or ER visits: 4 # of surgeries: 0  Review of Systems    Patient reports that his overall health is worse compared to last year.  Patient Reported Readings (BP, Pulse, CBG, Weight, etc) None  Review of Systems: No complaints  All other systems negative.  Pain Assessment Pain : No/denies pain     Current Medications & Allergies (verified) Allergies as of 02/14/2019      Reactions   Donepezil Nausea And Vomiting   Namenda [memantine Hcl] Other (See Comments)   Sleeps all the time   Phenergan [promethazine Hcl] Other (See Comments)   Aggressive       Medication List       Accurate as of February 14, 2019  2:38 PM. If you have any questions, ask your nurse or doctor.        STOP taking these medications   clotrimazole-betamethasone cream Commonly known as: Lotrisone   fluconazole 100 MG tablet Commonly known as: Diflucan   ondansetron 8 MG disintegrating tablet Commonly known as: ZOFRAN-ODT     TAKE these medications   aspirin 81 MG tablet Take 81 mg by mouth daily.   blood glucose meter kit and supplies Patient requests a Freestyle Meter. Use up to four times daily as directed. (FOR ICD-10 E10.9, E11.9).   citalopram 10 MG tablet Commonly known as: CELEXA Take 10 mg by mouth daily.   FreeStyle Freedom Lite w/Device Kit USE TO CHECK FASTING AND 2 HOURS AFTER BIGGEST MEAL Dx E11.8   glucose blood test strip Commonly known as: FREESTYLE LITE USE TO CHECK FASTING AND  2 HOURS AFTER BIGGEST MEAL   megestrol 400 MG/10ML suspension Commonly known as: MEGACE Take 10 mLs (400 mg total) by mouth 2 (two) times daily. For appetite stimulation   metFORMIN 1000 MG tablet Commonly known as: GLUCOPHAGE Take 1 tablet (1,000 mg total) by mouth daily with breakfast.   omeprazole 20 MG capsule Commonly known as: PRILOSEC TAKE 1 CAPSULE DAILY   predniSONE 10 MG (48) Tbpk tablet Commonly known as:  STERAPRED UNI-PAK 48 TAB Take as directed   rivastigmine 4.5 MG capsule Commonly known as: EXELON TAKE 1 CAPSULE TWICE A DAY   Sure Comfort Pen Needles 32G X 6 MM Misc Generic drug: Insulin Pen Needle USE AS DIRECTED   TYLENOL ARTHRITIS EXT RELIEF PO Take by mouth.   Vitamin D 50 MCG (2000 UT) Caps Take 1 capsule by mouth daily.       History (reviewed): Past Medical History:  Diagnosis Date  . AAA (abdominal aortic aneurysm) ([place])   . Alzheimer disease ([place])   . Anemia   . Ataxia   . Diabetes mellitus without complication ([place])   . History of gallstones   . Hyperlipidemia   . Hypertension   . Vitamin D deficiency    Past Surgical History:  Procedure Laterality Date  . ERCP    . INGUINAL HERNIA REPAIR    . TONSILLECTOMY     Family History  Problem Relation Age of Onset  . Diabetes Mother   . Hip fracture Mother   . Pulmonary embolism Mother        after hip fracture  . Lupus Sister   . Scleroderma Sister   . Diabetes Brother   . Hyperlipidemia Brother   . Hypertension Brother    Social History   Socioeconomic History  . Marital status: Married    Spouse name: Jon [name]  . Number of children: 1  . Years of education: 31  . Highest education level: High school graduate  Occupational History  . Occupation: retired  Scientific laboratory technician  . Financial resource strain: Not hard at all  . Food insecurity    Worry: Never true    Inability: Never true  . Transportation needs    Medical: No    Non-medical: No  Tobacco Use  . Smoking status: Former Smoker    Packs/day: 1.00    Years: 40.00    Pack years: 40.00    Types: Cigarettes    Start date: 07/03/1966    Quit date: 02/06/2009    Years since quitting: 10.0  . Smokeless tobacco: Never Used  Substance and Sexual Activity  . Alcohol use: No    Alcohol/week: 0.0 standard drinks  . Drug use: No  . Sexual activity: Not Currently  Lifestyle  . Physical activity    Days per week: 0 days    Minutes per session:  0 min  . Stress: Only a little  Relationships  . Social Herbalist on phone: Twice a week    Gets together: Twice a week    Attends religious service: Never    Active member of club or organization: No    Attends meetings of clubs or organizations: Never    Relationship status: Married  Other Topics Concern  . Not on file  Social History Narrative  . Not on file    Activities of Daily Living In your present state of health, do you have any difficulty performing the following activities: 02/14/2019 08/13/2018  Hearing? N N  Vision? Darreld Mclean  N  Comment when he wears his glasses he can see but pt doesn't wear them like he should -  Difficulty concentrating or making decisions? Aggie Moats  Comment his wife takes care of him and he also has a CNA that comes in Monday through Friday to help -  Walking or climbing stairs? Y N  Comment pt is wheelchair bound -  Dressing or bathing? Y N  Comment his wife or CNA helps dress him -  Doing errands, shopping? Y N  Comment wife takes him to all of his appointments and does all his errands -  Conservation officer, nature and eating ? Y -  Comment wife prepares most of his meals and sometimes has to help feed him -  Using the Toilet? Y -  Comment wife has to help him transfer -  In the past six months, have you accidently leaked urine? Y -  Comment he wears adult pull ups -  Do you have problems with loss of bowel control? Y -  Comment has to wear pull ups -  Managing your Medications? Y -  Comment wife takes care of all of his medications and administers them to him -  Managing your Finances? Y -  Comment wife does all the finances -  Housekeeping or managing your Housekeeping? Y -  Comment wife does all the housekeeping -  Some recent data might be hidden    Patient Education/ Literacy How often do you need to have someone help you when you read instructions, pamphlets, or other written materials from your doctor or pharmacy?: 5 - Always(when he was  younger he could read great but now due to his declining health he can't) What is the last grade level you completed in school?: 12th grade  Exercise Current Exercise Habits: The patient does not participate in regular exercise at present, Exercise limited by: orthopedic condition(s)  Diet Patient reports consuming 3 meals a day and 2 snack(s) a day Patient reports that his primary diet is: Regular Patient reports that she does have regular access to food.   Depression Screen PHQ 2/9 Scores 02/14/2019 08/23/2018 05/03/2018 03/12/2018 02/06/2018 09/07/2017 06/08/2017  PHQ - 2 Score 0 0 0 0 0 0 0  PHQ- 9 Score - - - - - - -     Fall Risk Fall Risk  02/14/2019 08/23/2018 02/06/2018 09/07/2017 06/08/2017  Falls in the past year? Exclusion - non ambulatory 0 No No Yes  Number falls in past yr: - - - - 1  Injury with Fall? - - - - -  Risk Factor Category  - - - - High Fall Risk  Risk for fall due to : - - - - -  Follow up - - - - -     Objective:  Jon [name] seemed alert and oriented and he participated appropriately during our telephone visit.  Blood Pressure Weight BMI  BP Readings from Last 3 Encounters:  02/05/19 102/75  08/23/18 108/75  08/21/18 120/71   Wt Readings from Last 3 Encounters:  08/12/18 178 lb 9.2 oz (81 kg)  05/03/18 178 lb 12.8 oz (81.1 kg)  04/08/18 195 lb (88.5 kg)   BMI Readings from Last 1 Encounters:  08/23/18 25.62 kg/m    *Unable to obtain current vital signs, weight, and BMI due to telephone visit type  Hearing/Vision  . Edsel did not seem to have difficulty with hearing/understanding during the telephone conversation . Reports that he has had a formal eye  exam by an eye care professional within the past year . Reports that he has not had a formal hearing evaluation within the past year *Unable to fully assess hearing and vision during telephone visit type  Cognitive Function: No flowsheet data found. (Normal:0-7, Significant for Dysfunction: >8)  Normal  Cognitive Function Screening: unable to do cognitive screening and has neurology appt next week   Immunization & Health Maintenance Record  There is no immunization history on file for this patient.  Health Maintenance  Topic Date Due  . TETANUS/TDAP  12/20/1966  . COLONOSCOPY  12/19/1997  . PNA vac Low Risk Adult (1 of 2 - PCV13) 12/19/2012  . OPHTHALMOLOGY EXAM  01/15/2016  . FOOT EXAM  09/08/2018  . URINE MICROALBUMIN  12/25/2018  . HEMOGLOBIN A1C  02/10/2019  . Hepatitis C Screening  Completed  . INFLUENZA VACCINE  Discontinued       Assessment  This is a routine wellness examination for Jon [name].  Health Maintenance: Due or Overdue Health Maintenance Due  Topic Date Due  . TETANUS/TDAP  12/20/1966  . COLONOSCOPY  12/19/1997  . PNA vac Low Risk Adult (1 of 2 - PCV13) 12/19/2012  . OPHTHALMOLOGY EXAM  01/15/2016  . FOOT EXAM  09/08/2018  . URINE MICROALBUMIN  12/25/2018  . HEMOGLOBIN A1C  02/10/2019    Jon [name] does not need a referral for Community Assistance: Care Management:   no Social Work:    no Prescription Assistance:  no Nutrition/Diabetes Education:  no   Plan:  Personalized Goals Goals Addressed            This Visit's Progress   . DIET - INCREASE WATER INTAKE       Try to drink 6-8 glasses of water daily.      Personalized Health Maintenance & Screening Recommendations  Diabetes screening  Lung Cancer Screening Recommended: no (Low Dose CT Chest recommended if Age 37-80 years, 30 pack-year currently smoking OR have quit w/in past 15 years) Hepatitis C Screening recommended: no HIV Screening recommended: no  Advanced Directives: Written information was not prepared per patient's request.  Referrals & Orders No orders of the defined types were placed in this encounter.   Follow-up Plan . Follow-up with Jon Fraise, MD as planned . Schedule your Diabetic Eye Exam   I have personally reviewed and noted the following in  the patient's chart:   . Medical and social history . Use of alcohol, tobacco or illicit drugs  . Current medications and supplements . Functional ability and status . Nutritional status . Physical activity . Advanced directives . List of other physicians . Hospitalizations, surgeries, and ER visits in previous 12 months . Vitals . Screenings to include cognitive, depression, and falls . Referrals and appointments  In addition, I have reviewed and discussed with Jon [name] certain preventive protocols, quality metrics, and best practice recommendations. A written personalized care plan for preventive services as well as general preventive health recommendations is available and can be mailed to the patient at his request.      Marylin Crosby, LPN  4/43/1540

## 2019-02-14 NOTE — Patient Instructions (Signed)
Preventive Care 71 Years and Older, Male Preventive care refers to lifestyle choices and visits with your health care provider that can promote health and wellness. This includes:  A yearly physical exam. This is also called an annual well check.  Regular dental and eye exams.  Immunizations.  Screening for certain conditions.  Healthy lifestyle choices, such as diet and exercise. What can I expect for my preventive care visit? Physical exam Your health care provider will check:  Height and weight. These may be used to calculate body mass index (BMI), which is a measurement that tells if you are at a healthy weight.  Heart rate and blood pressure.  Your skin for abnormal spots. Counseling Your health care provider may ask you questions about:  Alcohol, tobacco, and drug use.  Emotional well-being.  Home and relationship well-being.  Sexual activity.  Eating habits.  History of falls.  Memory and ability to understand (cognition).  Work and work Statistician. What immunizations do I need?  Influenza (flu) vaccine  This is recommended every year. Tetanus, diphtheria, and pertussis (Tdap) vaccine  You may need a Td booster every 10 years. Varicella (chickenpox) vaccine  You may need this vaccine if you have not already been vaccinated. Zoster (shingles) vaccine  You may need this after age 50. Pneumococcal conjugate (PCV13) vaccine  One dose is recommended after age 24. Pneumococcal polysaccharide (PPSV23) vaccine  One dose is recommended after age 33. Measles, mumps, and rubella (MMR) vaccine  You may need at least one dose of MMR if you were born in 1957 or later. You may also need a second dose. Meningococcal conjugate (MenACWY) vaccine  You may need this if you have certain conditions. Hepatitis A vaccine  You may need this if you have certain conditions or if you travel or work in places where you may be exposed to hepatitis A. Hepatitis B vaccine   You may need this if you have certain conditions or if you travel or work in places where you may be exposed to hepatitis B. Haemophilus influenzae type b (Hib) vaccine  You may need this if you have certain conditions. You may receive vaccines as individual doses or as more than one vaccine together in one shot (combination vaccines). Talk with your health care provider about the risks and benefits of combination vaccines. What tests do I need? Blood tests  Lipid and cholesterol levels. These may be checked every 5 years, or more frequently depending on your overall health.  Hepatitis C test.  Hepatitis B test. Screening  Lung cancer screening. You may have this screening every year starting at age 71 if you have a 30-pack-year history of smoking and currently smoke or have quit within the past 15 years.  Colorectal cancer screening. All adults should have this screening starting at age 57 and continuing until age 54. Your health care provider may recommend screening at age 47 if you are at increased risk. You will have tests every 1-10 years, depending on your results and the type of screening test.  Prostate cancer screening. Recommendations will vary depending on your family history and other risks.  Diabetes screening. This is done by checking your blood sugar (glucose) after you have not eaten for a while (fasting). You may have this done every 1-3 years.  Abdominal aortic aneurysm (AAA) screening. You may need this if you are a current or former smoker.  Sexually transmitted disease (STD) testing. Follow these instructions at home: Eating and drinking  Eat  a diet that includes fresh fruits and vegetables, whole grains, lean protein, and low-fat dairy products. Limit your intake of foods with high amounts of sugar, saturated fats, and salt.  Take vitamin and mineral supplements as recommended by your health care provider.  Do not drink alcohol if your health care provider  tells you not to drink.  If you drink alcohol: ? Limit how much you have to 0-2 drinks a day. ? Be aware of how much alcohol is in your drink. In the [place] one drink equals one 12 oz bottle of beer (355 mL), one 5 oz glass of wine (148 mL), or one 1 oz glass of hard liquor (44 mL). Lifestyle  Take daily care of your teeth and gums.  Stay active. Exercise for at least 30 minutes on 5 or more days each week.  Do not use any products that contain nicotine or tobacco, such as cigarettes, e-cigarettes, and chewing tobacco. If you need help quitting, ask your health care provider.  If you are sexually active, practice safe sex. Use a condom or other form of protection to prevent STIs (sexually transmitted infections).  Talk with your health care provider about taking a low-dose aspirin or statin. What's next?  Visit your health care provider once a year for a well check visit.  Ask your health care provider how often you should have your eyes and teeth checked.  Stay up to date on all vaccines. This information is not intended to replace advice given to you by your health care provider. Make sure you discuss any questions you have with your health care provider. Document Released: 07/16/2015 Document Revised: 06/13/2018 Document Reviewed: 06/13/2018 Elsevier Patient Education  2020 [employer]  

## 2019-02-19 DIAGNOSIS — F0280 Dementia in other diseases classified elsewhere without behavioral disturbance: Secondary | ICD-10-CM | POA: Diagnosis not present

## 2019-02-19 DIAGNOSIS — G301 Alzheimer's disease with late onset: Secondary | ICD-10-CM | POA: Diagnosis not present

## 2019-02-19 DIAGNOSIS — R29898 Other symptoms and signs involving the musculoskeletal system: Secondary | ICD-10-CM | POA: Diagnosis not present

## 2019-02-20 ENCOUNTER — Other Ambulatory Visit: Payer: Medicare Other

## 2019-02-20 ENCOUNTER — Other Ambulatory Visit: Payer: Self-pay

## 2019-02-20 DIAGNOSIS — F0281 Dementia in other diseases classified elsewhere with behavioral disturbance: Secondary | ICD-10-CM | POA: Diagnosis not present

## 2019-02-20 DIAGNOSIS — M6281 Muscle weakness (generalized): Secondary | ICD-10-CM | POA: Diagnosis not present

## 2019-02-20 DIAGNOSIS — S32000D Wedge compression fracture of unspecified lumbar vertebra, subsequent encounter for fracture with routine healing: Secondary | ICD-10-CM | POA: Diagnosis not present

## 2019-02-20 DIAGNOSIS — G301 Alzheimer's disease with late onset: Secondary | ICD-10-CM | POA: Diagnosis not present

## 2019-02-20 DIAGNOSIS — G3183 Dementia with Lewy bodies: Secondary | ICD-10-CM | POA: Diagnosis not present

## 2019-02-20 DIAGNOSIS — F0280 Dementia in other diseases classified elsewhere without behavioral disturbance: Secondary | ICD-10-CM | POA: Diagnosis not present

## 2019-02-22 ENCOUNTER — Encounter: Payer: Self-pay | Admitting: Family Medicine

## 2019-02-24 ENCOUNTER — Other Ambulatory Visit: Payer: Self-pay

## 2019-02-25 ENCOUNTER — Encounter: Payer: Self-pay | Admitting: Family Medicine

## 2019-02-25 ENCOUNTER — Ambulatory Visit: Payer: Medicare Other | Admitting: Family Medicine

## 2019-02-25 ENCOUNTER — Ambulatory Visit (INDEPENDENT_AMBULATORY_CARE_PROVIDER_SITE_OTHER): Payer: Medicare Other | Admitting: Family Medicine

## 2019-02-25 VITALS — BP 86/68 | HR 74 | Temp 99.3°F

## 2019-02-25 DIAGNOSIS — IMO0002 Reserved for concepts with insufficient information to code with codable children: Secondary | ICD-10-CM

## 2019-02-25 DIAGNOSIS — I10 Essential (primary) hypertension: Secondary | ICD-10-CM | POA: Diagnosis not present

## 2019-02-25 DIAGNOSIS — E782 Mixed hyperlipidemia: Secondary | ICD-10-CM

## 2019-02-25 DIAGNOSIS — E1165 Type 2 diabetes mellitus with hyperglycemia: Secondary | ICD-10-CM | POA: Diagnosis not present

## 2019-02-25 DIAGNOSIS — E118 Type 2 diabetes mellitus with unspecified complications: Secondary | ICD-10-CM | POA: Diagnosis not present

## 2019-02-25 DIAGNOSIS — G3183 Dementia with Lewy bodies: Secondary | ICD-10-CM

## 2019-02-25 DIAGNOSIS — Z794 Long term (current) use of insulin: Secondary | ICD-10-CM

## 2019-02-25 DIAGNOSIS — R29898 Other symptoms and signs involving the musculoskeletal system: Secondary | ICD-10-CM

## 2019-02-25 DIAGNOSIS — F0281 Dementia in other diseases classified elsewhere with behavioral disturbance: Secondary | ICD-10-CM

## 2019-02-25 LAB — BAYER DCA HB A1C WAIVED: HB A1C (BAYER DCA - WAIVED): 5.9 % (ref <7.0)

## 2019-02-25 MED ORDER — GABAPENTIN 300 MG PO CAPS: 300.0000 mg | ORAL_CAPSULE | Freq: Every day | ORAL | 3 refills | Status: DC

## 2019-02-25 NOTE — Progress Notes (Signed)
Subjective:  Patient ID: Jon [name], male    DOB: August 07, 1945  Age: 73 y.o. MRN: [id number]  CC: Medical Management of Chronic Issues, Diabetes, Hypertension, and Hyperlipidemia   HPI Jon [name] presents forFollow-up of diabetes. Patient checks blood sugar at home.   100-110 fasting and about the same postprandial. Appetite is good, but still losing weight Patient denies symptoms such as polyuria, polydipsia, excessive hunger, nausea No significant hypoglycemic spells noted. Medications reviewed. Pt reports taking them regularly without complication/adverse reaction being reported today.  Checking feet daily. Last eye appt was recent.  History Jon [name] has a past medical history of AAA (abdominal aortic aneurysm) ([place]), Alzheimer disease ([place]), Anemia, Ataxia, Diabetes mellitus without complication ([place]), History of gallstones, Hyperlipidemia, Hypertension, and Vitamin D deficiency.   He has a past surgical history that includes ERCP; Inguinal hernia repair; and Tonsillectomy.   His family history includes Diabetes in his brother and mother; Hip fracture in his mother; Hyperlipidemia in his brother; Hypertension in his brother; Lupus in his sister; Pulmonary embolism in his mother; Scleroderma in his sister.He reports that he quit smoking about 10 years ago. His smoking use included cigarettes. He started smoking about 52 years ago. He has a 40.00 pack-year smoking history. He has never used smokeless tobacco. He reports that he does not drink alcohol or use drugs.  Current Outpatient Medications on File Prior to Visit  Medication Sig Dispense Refill  . Acetaminophen (TYLENOL ARTHRITIS EXT RELIEF PO) Take by mouth.    [name] aspirin 81 MG tablet Take 81 mg by mouth daily.    . blood glucose meter kit and supplies Patient requests a Freestyle Meter. Use up to four times daily as directed. (FOR ICD-10 E10.9, E11.9). 1 each 0  . Blood Glucose Monitoring Suppl (FREESTYLE FREEDOM LITE) w/Device  KIT USE TO CHECK FASTING AND 2 HOURS AFTER BIGGEST MEAL Dx E11.8 1 each 0  . Cholecalciferol (VITAMIN D) 50 MCG (2000 UT) CAPS Take 1 capsule by mouth daily.    . citalopram (CELEXA) 10 MG tablet Take 10 mg by mouth daily.    [name] glucose blood (FREESTYLE LITE) test strip USE TO CHECK FASTING AND 2 HOURS AFTER BIGGEST MEAL 200 each 3  . megestrol (MEGACE) 400 MG/10ML suspension Take 10 mLs (400 mg total) by mouth 2 (two) times daily. For appetite stimulation 600 mL 2  . metFORMIN (GLUCOPHAGE) 1000 MG tablet Take 1 tablet (1,000 mg total) by mouth daily with breakfast. 90 tablet 1  . omeprazole (PRILOSEC) 20 MG capsule TAKE 1 CAPSULE DAILY 90 capsule 0  . rivastigmine (EXELON) 4.5 MG capsule TAKE 1 CAPSULE TWICE A DAY 180 capsule 1  . SURE COMFORT PEN NEEDLES 32G X 6 MM MISC USE AS DIRECTED 100 each 3   No current facility-administered medications on file prior to visit.     ROS Review of Systems  Constitutional: Negative.   HENT: Negative.   Eyes: Negative for visual disturbance.  Respiratory: Negative for cough and shortness of breath.   Cardiovascular: Negative for chest pain and leg swelling.  Gastrointestinal: Negative for abdominal pain, diarrhea, nausea and vomiting.  Genitourinary: Negative for difficulty urinating.  Musculoskeletal: Positive for myalgias. Negative for arthralgias.  Skin: Negative for rash.  Neurological: Positive for weakness. Negative for headaches.       8/10 pain from right shoulder to right hand. Weak in hands. Concerned for weakening grip.  Psychiatric/Behavioral: Negative for sleep disturbance.    Objective:  BP (!) 86/68  Pulse 74   Temp 99.3 F (37.4 C) (Oral)   BP Readings from Last 3 Encounters:  02/25/19 (!) 86/68  02/05/19 102/75  08/23/18 108/75    Wt Readings from Last 3 Encounters:  08/12/18 178 lb 9.2 oz (81 kg)  05/03/18 178 lb 12.8 oz (81.1 kg)  04/08/18 195 lb (88.5 kg)     Physical Exam Constitutional:      General: He is  not in acute distress.    Appearance: He is well-developed.  HENT:     Head: Normocephalic and atraumatic.     Right Ear: External ear normal.     Left Ear: External ear normal.     Nose: Nose normal.  Eyes:     Conjunctiva/sclera: Conjunctivae normal.     Pupils: Pupils are equal, round, and reactive to light.  Neck:     Musculoskeletal: Normal range of motion and neck supple.  Cardiovascular:     Rate and Rhythm: Normal rate and regular rhythm.     Heart sounds: Normal heart sounds. No murmur.  Pulmonary:     Effort: Pulmonary effort is normal. No respiratory distress.     Breath sounds: Normal breath sounds. No wheezing or rales.  Abdominal:     Palpations: Abdomen is soft.     Tenderness: There is no abdominal tenderness.  Musculoskeletal:        General: Tenderness (right shoulder, upper arm and hand) present.  Skin:    General: Skin is warm and dry.  Neurological:     Mental Status: He is alert and oriented to person, place, and time.     Deep Tendon Reflexes: Reflexes are normal and symmetric.  Psychiatric:        Behavior: Behavior normal.        Thought Content: Thought content normal.        Judgment: Judgment normal.       Assessment & Plan:   Jon [name] was seen today for medical management of chronic issues, diabetes, hypertension and hyperlipidemia.  Diagnoses and all orders for this visit:  Uncontrolled type 2 diabetes mellitus with complication, with long-term current use of insulin ([place]) -     Bayer DCA Hb A1c Waived -     CBC with Differential/Platelet  Lewy body dementia with behavioral disturbance (HCC) -     CBC with Differential/Platelet  Mixed hyperlipidemia -     CBC with Differential/Platelet -     Lipid panel  Essential hypertension -     CBC with Differential/Platelet -     CMP14+EGFR  Weakness of both upper extremities -     Ambulatory referral to Occupational Therapy  Other orders -     gabapentin (NEURONTIN) 300 MG capsule; Take 1  capsule (300 mg total) by mouth at bedtime.      I have discontinued Jon [name]'s predniSONE. I am also having him start on gabapentin. Additionally, I am having him maintain his aspirin, Acetaminophen (TYLENOL ARTHRITIS EXT RELIEF PO), citalopram, Sure Comfort Pen Needles, Vitamin D, glucose blood, blood glucose meter kit and supplies, FreeStyle Freedom Lite, megestrol, metFORMIN, omeprazole, and rivastigmine.  Meds ordered this encounter  Medications  . gabapentin (NEURONTIN) 300 MG capsule    Sig: Take 1 capsule (300 mg total) by mouth at bedtime.    Dispense:  30 capsule    Refill:  3     Follow-up: Return in about 3 months (around 05/28/2019).  Claretta Fraise, M.D.

## 2019-02-26 DIAGNOSIS — S32000D Wedge compression fracture of unspecified lumbar vertebra, subsequent encounter for fracture with routine healing: Secondary | ICD-10-CM | POA: Diagnosis not present

## 2019-02-26 DIAGNOSIS — F0281 Dementia in other diseases classified elsewhere with behavioral disturbance: Secondary | ICD-10-CM | POA: Diagnosis not present

## 2019-02-26 DIAGNOSIS — M6281 Muscle weakness (generalized): Secondary | ICD-10-CM | POA: Diagnosis not present

## 2019-02-26 DIAGNOSIS — G3183 Dementia with Lewy bodies: Secondary | ICD-10-CM | POA: Diagnosis not present

## 2019-02-26 LAB — CMP14+EGFR
ALT: 61 IU/L — ABNORMAL HIGH (ref 0–44)
AST: 31 IU/L (ref 0–40)
Albumin/Globulin Ratio: 1.4 (ref 1.2–2.2)
Albumin: 3.8 g/dL (ref 3.7–4.7)
Alkaline Phosphatase: 324 IU/L — ABNORMAL HIGH (ref 39–117)
BUN/Creatinine Ratio: 19 (ref 10–24)
BUN: 13 mg/dL (ref 8–27)
Bilirubin Total: 0.6 mg/dL (ref 0.0–1.2)
CO2: 21 mmol/L (ref 20–29)
Calcium: 9 mg/dL (ref 8.6–10.2)
Chloride: 103 mmol/L (ref 96–106)
Creatinine, Ser: 0.69 mg/dL — ABNORMAL LOW (ref 0.76–1.27)
GFR calc Af Amer: 110 mL/min/{1.73_m2} (ref >59)
GFR calc non Af Amer: 96 mL/min/{1.73_m2} (ref >59)
Globulin, Total: 2.8 g/dL (ref 1.5–4.5)
Glucose: 203 mg/dL — ABNORMAL HIGH (ref 65–99)
Potassium: 3.9 mmol/L (ref 3.5–5.2)
Sodium: 138 mmol/L (ref 134–144)
Total Protein: 6.6 g/dL (ref 6.0–8.5)

## 2019-02-26 LAB — CBC WITH DIFFERENTIAL/PLATELET
Basophils Absolute: 0 10*3/uL (ref 0.0–0.2)
Basos: 0 %
EOS (ABSOLUTE): 0.2 10*3/uL (ref 0.0–0.4)
Eos: 2 %
Hematocrit: 43.5 % (ref 37.5–51.0)
Hemoglobin: 15.1 g/dL (ref 13.0–17.7)
Immature Grans (Abs): 0 10*3/uL (ref 0.0–0.1)
Immature Granulocytes: 0 %
Lymphocytes Absolute: 3.2 10*3/uL — ABNORMAL HIGH (ref 0.7–3.1)
Lymphs: 31 %
MCH: 31.4 pg (ref 26.6–33.0)
MCHC: 34.7 g/dL (ref 31.5–35.7)
MCV: 90 fL (ref 79–97)
Monocytes Absolute: 0.7 10*3/uL (ref 0.1–0.9)
Monocytes: 7 %
Neutrophils Absolute: 6.2 10*3/uL (ref 1.4–7.0)
Neutrophils: 60 %
Platelets: 179 10*3/uL (ref 150–450)
RBC: 4.81 x10E6/uL (ref 4.14–5.80)
RDW: 13 % (ref 11.6–15.4)
WBC: 10.3 10*3/uL (ref 3.4–10.8)

## 2019-02-26 LAB — LIPID PANEL
Chol/HDL Ratio: 3.9 ratio (ref 0.0–5.0)
Cholesterol, Total: 220 mg/dL — ABNORMAL HIGH (ref 100–199)
HDL: 57 mg/dL (ref >39)
LDL Calculated: 115 mg/dL — ABNORMAL HIGH (ref 0–99)
Triglycerides: 241 mg/dL — ABNORMAL HIGH (ref 0–149)
VLDL Cholesterol Cal: 48 mg/dL — ABNORMAL HIGH (ref 5–40)

## 2019-03-02 ENCOUNTER — Telehealth: Payer: Self-pay | Admitting: Physician Assistant

## 2019-03-03 DIAGNOSIS — S32000D Wedge compression fracture of unspecified lumbar vertebra, subsequent encounter for fracture with routine healing: Secondary | ICD-10-CM | POA: Diagnosis not present

## 2019-03-03 DIAGNOSIS — M6281 Muscle weakness (generalized): Secondary | ICD-10-CM | POA: Diagnosis not present

## 2019-03-03 DIAGNOSIS — F0281 Dementia in other diseases classified elsewhere with behavioral disturbance: Secondary | ICD-10-CM | POA: Diagnosis not present

## 2019-03-03 DIAGNOSIS — G3183 Dementia with Lewy bodies: Secondary | ICD-10-CM | POA: Diagnosis not present

## 2019-03-04 ENCOUNTER — Telehealth: Payer: Self-pay | Admitting: Family Medicine

## 2019-03-04 NOTE — Telephone Encounter (Signed)
Verified w/ [facility] Pt has PT orders till middle of this month PT will call if orders are needing to be extended

## 2019-03-04 NOTE — Telephone Encounter (Signed)
Gabapentin 300 mg has not helped with his pain.  Should he increase the medication?

## 2019-03-04 NOTE — Telephone Encounter (Signed)
Patients wife aware

## 2019-03-04 NOTE — Telephone Encounter (Signed)
Please contact the patient yes, add an extra pill per day.

## 2019-03-05 DIAGNOSIS — F0281 Dementia in other diseases classified elsewhere with behavioral disturbance: Secondary | ICD-10-CM | POA: Diagnosis not present

## 2019-03-05 DIAGNOSIS — S32000D Wedge compression fracture of unspecified lumbar vertebra, subsequent encounter for fracture with routine healing: Secondary | ICD-10-CM | POA: Diagnosis not present

## 2019-03-05 DIAGNOSIS — G3183 Dementia with Lewy bodies: Secondary | ICD-10-CM | POA: Diagnosis not present

## 2019-03-05 DIAGNOSIS — M6281 Muscle weakness (generalized): Secondary | ICD-10-CM | POA: Diagnosis not present

## 2019-03-05 NOTE — Telephone Encounter (Signed)
This belongs to Dr. Livia Snellen.  I merely put the phone message in because his wife was seen in the office.  She brought it up to me.  So therefore I made the telephone encounter for this to go to you and Dr. Livia Snellen.

## 2019-03-06 DIAGNOSIS — M6281 Muscle weakness (generalized): Secondary | ICD-10-CM | POA: Diagnosis not present

## 2019-03-06 DIAGNOSIS — G3183 Dementia with Lewy bodies: Secondary | ICD-10-CM | POA: Diagnosis not present

## 2019-03-06 DIAGNOSIS — S32000D Wedge compression fracture of unspecified lumbar vertebra, subsequent encounter for fracture with routine healing: Secondary | ICD-10-CM | POA: Diagnosis not present

## 2019-03-06 DIAGNOSIS — F0281 Dementia in other diseases classified elsewhere with behavioral disturbance: Secondary | ICD-10-CM | POA: Diagnosis not present

## 2019-03-07 ENCOUNTER — Other Ambulatory Visit: Payer: Self-pay | Admitting: Family Medicine

## 2019-03-07 MED ORDER — GABAPENTIN 300 MG PO CAPS: 300.0000 mg | ORAL_CAPSULE | Freq: Every day | ORAL | 3 refills | Status: DC

## 2019-03-07 NOTE — Telephone Encounter (Signed)
Pt aware.

## 2019-03-07 NOTE — Telephone Encounter (Signed)
Sent med

## 2019-03-11 ENCOUNTER — Encounter: Payer: Self-pay | Admitting: Family Medicine

## 2019-03-11 ENCOUNTER — Other Ambulatory Visit: Payer: Self-pay | Admitting: *Deleted

## 2019-03-11 DIAGNOSIS — G301 Alzheimer's disease with late onset: Secondary | ICD-10-CM | POA: Diagnosis not present

## 2019-03-11 DIAGNOSIS — R29898 Other symptoms and signs involving the musculoskeletal system: Secondary | ICD-10-CM | POA: Diagnosis not present

## 2019-03-11 DIAGNOSIS — R262 Difficulty in walking, not elsewhere classified: Secondary | ICD-10-CM | POA: Diagnosis not present

## 2019-03-11 DIAGNOSIS — F0280 Dementia in other diseases classified elsewhere without behavioral disturbance: Secondary | ICD-10-CM | POA: Diagnosis not present

## 2019-03-11 DIAGNOSIS — G319 Degenerative disease of nervous system, unspecified: Secondary | ICD-10-CM | POA: Diagnosis not present

## 2019-03-11 DIAGNOSIS — M47812 Spondylosis without myelopathy or radiculopathy, cervical region: Secondary | ICD-10-CM | POA: Diagnosis not present

## 2019-03-11 DIAGNOSIS — M2578 Osteophyte, vertebrae: Secondary | ICD-10-CM | POA: Diagnosis not present

## 2019-03-11 DIAGNOSIS — M5032 Other cervical disc degeneration, mid-cervical region, unspecified level: Secondary | ICD-10-CM | POA: Diagnosis not present

## 2019-03-11 DIAGNOSIS — M5136 Other intervertebral disc degeneration, lumbar region: Secondary | ICD-10-CM | POA: Diagnosis not present

## 2019-03-11 DIAGNOSIS — R531 Weakness: Secondary | ICD-10-CM | POA: Diagnosis not present

## 2019-03-11 DIAGNOSIS — M47816 Spondylosis without myelopathy or radiculopathy, lumbar region: Secondary | ICD-10-CM | POA: Diagnosis not present

## 2019-03-11 MED ORDER — GABAPENTIN 300 MG PO CAPS: 300.0000 mg | ORAL_CAPSULE | Freq: Three times a day (TID) | ORAL | 3 refills | Status: DC

## 2019-03-11 NOTE — Telephone Encounter (Signed)
He is still on a low dose. Go up to 300 mg TID.

## 2019-03-12 DIAGNOSIS — S32000D Wedge compression fracture of unspecified lumbar vertebra, subsequent encounter for fracture with routine healing: Secondary | ICD-10-CM | POA: Diagnosis not present

## 2019-03-12 DIAGNOSIS — F0281 Dementia in other diseases classified elsewhere with behavioral disturbance: Secondary | ICD-10-CM | POA: Diagnosis not present

## 2019-03-12 DIAGNOSIS — G3183 Dementia with Lewy bodies: Secondary | ICD-10-CM | POA: Diagnosis not present

## 2019-03-12 DIAGNOSIS — M6281 Muscle weakness (generalized): Secondary | ICD-10-CM | POA: Diagnosis not present

## 2019-03-13 ENCOUNTER — Encounter: Payer: Self-pay | Admitting: Neurology

## 2019-03-13 DIAGNOSIS — S32000D Wedge compression fracture of unspecified lumbar vertebra, subsequent encounter for fracture with routine healing: Secondary | ICD-10-CM | POA: Diagnosis not present

## 2019-03-13 DIAGNOSIS — G3183 Dementia with Lewy bodies: Secondary | ICD-10-CM | POA: Diagnosis not present

## 2019-03-13 DIAGNOSIS — F0281 Dementia in other diseases classified elsewhere with behavioral disturbance: Secondary | ICD-10-CM | POA: Diagnosis not present

## 2019-03-13 DIAGNOSIS — M6281 Muscle weakness (generalized): Secondary | ICD-10-CM | POA: Diagnosis not present

## 2019-03-14 DIAGNOSIS — S32000D Wedge compression fracture of unspecified lumbar vertebra, subsequent encounter for fracture with routine healing: Secondary | ICD-10-CM | POA: Diagnosis not present

## 2019-03-14 DIAGNOSIS — M6281 Muscle weakness (generalized): Secondary | ICD-10-CM | POA: Diagnosis not present

## 2019-03-14 DIAGNOSIS — G3183 Dementia with Lewy bodies: Secondary | ICD-10-CM | POA: Diagnosis not present

## 2019-03-14 DIAGNOSIS — F0281 Dementia in other diseases classified elsewhere with behavioral disturbance: Secondary | ICD-10-CM | POA: Diagnosis not present

## 2019-03-17 ENCOUNTER — Telehealth: Payer: Self-pay | Admitting: Family Medicine

## 2019-03-17 ENCOUNTER — Other Ambulatory Visit: Payer: Self-pay | Admitting: Family Medicine

## 2019-03-17 DIAGNOSIS — F0150 Vascular dementia without behavioral disturbance: Secondary | ICD-10-CM | POA: Diagnosis not present

## 2019-03-17 MED ORDER — FLUCONAZOLE 100 MG PO TABS: ORAL_TABLET | ORAL | 0 refills | Status: DC

## 2019-03-17 NOTE — Telephone Encounter (Signed)
I sent in the requested prescription 

## 2019-03-18 ENCOUNTER — Telehealth: Payer: Self-pay | Admitting: Family Medicine

## 2019-03-18 MED ORDER — FLUCONAZOLE 100 MG PO TABS: ORAL_TABLET | ORAL | 0 refills | Status: DC

## 2019-03-18 NOTE — Telephone Encounter (Signed)
Patients wife aware

## 2019-03-18 NOTE — Telephone Encounter (Signed)
Med sent to [facility] and [facility] at [facility]

## 2019-04-02 ENCOUNTER — Encounter: Payer: Self-pay | Admitting: Family Medicine

## 2019-04-08 ENCOUNTER — Encounter: Payer: Self-pay | Admitting: Family Medicine

## 2019-04-11 ENCOUNTER — Ambulatory Visit (INDEPENDENT_AMBULATORY_CARE_PROVIDER_SITE_OTHER): Payer: Medicare Other | Admitting: Family Medicine

## 2019-04-11 ENCOUNTER — Encounter: Payer: Self-pay | Admitting: Family Medicine

## 2019-04-11 DIAGNOSIS — G20 Parkinson's disease: Secondary | ICD-10-CM

## 2019-04-11 MED ORDER — ROPINIROLE HCL 0.25 MG PO TABS: 0.2500 mg | ORAL_TABLET | Freq: Three times a day (TID) | ORAL | 0 refills | Status: DC

## 2019-04-11 NOTE — Progress Notes (Signed)
Subjective:    Patient ID: Jon [name], male    DOB: 1945/07/28, 73 y.o.   MRN: [id number]   HPI: Jon [name] is a 74 y.o. male presenting for onset yesterday of episodes of shakes. There were 2-3 yesterday and 2 more this morning. They last several minutes. He is conscious during the time. Unable to control them. Not postictal, but appetite was poor this AM. Vitals were taken, stable. Glucose was not high or low. Shakes were all over his armas and legs.moderate in severity.    Depression screen [facility] 2/9 02/25/2019 02/14/2019 08/23/2018 05/03/2018 03/12/2018  Decreased Interest 0 0 0 0 0  Down, Depressed, Hopeless 0 0 0 0 0  PHQ - 2 Score 0 0 0 0 0  Altered sleeping - - - - -  Tired, decreased energy - - - - -  Change in appetite - - - - -  Feeling bad or failure about yourself  - - - - -  Trouble concentrating - - - - -  Moving slowly or fidgety/restless - - - - -  Suicidal thoughts - - - - -  PHQ-9 Score - - - - -     Relevant past medical, surgical, family and social history reviewed and updated as indicated.  Interim medical history since our last visit reviewed. Allergies and medications reviewed and updated.  ROS:  Review of Systems  Constitutional: Positive for activity change (continues to decline) and appetite change (usually good until this AM. ).  HENT: Negative.   Eyes: Negative for visual disturbance.  Respiratory: Negative for cough and shortness of breath.   Cardiovascular: Negative for chest pain and leg swelling.  Gastrointestinal: Negative for abdominal pain, diarrhea, nausea and vomiting.  Genitourinary: Negative.   Musculoskeletal: Negative for arthralgias and myalgias.  Skin: Negative for rash.  Neurological: Positive for tremors. Negative for headaches.  Psychiatric/Behavioral: Positive for confusion and dysphoric mood. Negative for sleep disturbance. The patient is nervous/anxious.      Social History   Tobacco Use  Smoking Status Former Smoker   . Packs/day: 1.00  . Years: 40.00  . Pack years: 40.00  . Types: Cigarettes  . Start date: 07/03/1966  . Quit date: 02/06/2009  . Years since quitting: 10.1  Smokeless Tobacco Never Used       Objective:     Wt Readings from Last 3 Encounters:  08/12/18 178 lb 9.2 oz (81 kg)  05/03/18 178 lb 12.8 oz (81.1 kg)  04/08/18 195 lb (88.5 kg)     Exam deferred. Pt. Harbor[place] due to COVID 19. Phone visit performed.   Assessment & Plan:   1. Primary Parkinsonism (HCC)     Meds ordered this encounter  Medications  . rOPINIRole (REQUIP) 0.25 MG tablet    Sig: Take 1 tablet (0.25 mg total) by mouth 3 (three) times daily.    Dispense:  90 tablet    Refill:  0    No orders of the defined types were placed in this encounter.     Diagnoses and all orders for this visit:  Primary Parkinsonism (HCC)  Other orders -     rOPINIRole (REQUIP) 0.25 MG tablet; Take 1 tablet (0.25 mg total) by mouth 3 (three) times daily.    Virtual Visit via telephone Note  I discussed the limitations, risks, security and privacy concerns of performing an evaluation and management service by telephone and the availability of in person appointments. The patient was identified with two  identifiers. Pt.expressed understanding and agreed to proceed. Pt. Is at home. Dr. Livia Snellen is in his office.  Follow Up Instructions:   I discussed the assessment and treatment plan with the patient. The patient was provided an opportunity to ask questions and all were answered. The patient agreed with the plan and demonstrated an understanding of the instructions.   The patient was advised to call back or seek an in-person evaluation if the symptoms worsen or if the condition fails to improve as anticipated.   Total minutes including chart review and phone contact time: 24   Follow up plan: Return in about 1 month (around 05/12/2019), or if symptoms worsen or fail to improve.  Claretta Fraise, MD [place]

## 2019-04-14 DIAGNOSIS — J189 Pneumonia, unspecified organism: Secondary | ICD-10-CM | POA: Diagnosis present

## 2019-04-14 DIAGNOSIS — R9089 Other abnormal findings on diagnostic imaging of central nervous system: Secondary | ICD-10-CM | POA: Diagnosis not present

## 2019-04-14 DIAGNOSIS — Z888 Allergy status to other drugs, medicaments and biological substances status: Secondary | ICD-10-CM | POA: Diagnosis not present

## 2019-04-14 DIAGNOSIS — Z7401 Bed confinement status: Secondary | ICD-10-CM | POA: Diagnosis not present

## 2019-04-14 DIAGNOSIS — R339 Retention of urine, unspecified: Secondary | ICD-10-CM | POA: Diagnosis not present

## 2019-04-14 DIAGNOSIS — D72829 Elevated white blood cell count, unspecified: Secondary | ICD-10-CM | POA: Diagnosis not present

## 2019-04-14 DIAGNOSIS — Z743 Need for continuous supervision: Secondary | ICD-10-CM | POA: Diagnosis not present

## 2019-04-14 DIAGNOSIS — I10 Essential (primary) hypertension: Secondary | ICD-10-CM | POA: Diagnosis not present

## 2019-04-14 DIAGNOSIS — R0902 Hypoxemia: Secondary | ICD-10-CM | POA: Diagnosis not present

## 2019-04-14 DIAGNOSIS — Z22322 Carrier or suspected carrier of Methicillin resistant Staphylococcus aureus: Secondary | ICD-10-CM | POA: Diagnosis not present

## 2019-04-14 DIAGNOSIS — G3183 Dementia with Lewy bodies: Secondary | ICD-10-CM | POA: Diagnosis not present

## 2019-04-14 DIAGNOSIS — R4020 Unspecified coma: Secondary | ICD-10-CM | POA: Diagnosis not present

## 2019-04-14 DIAGNOSIS — I951 Orthostatic hypotension: Secondary | ICD-10-CM | POA: Insufficient documentation

## 2019-04-14 DIAGNOSIS — Z833 Family history of diabetes mellitus: Secondary | ICD-10-CM | POA: Diagnosis not present

## 2019-04-14 DIAGNOSIS — J9601 Acute respiratory failure with hypoxia: Secondary | ICD-10-CM | POA: Diagnosis not present

## 2019-04-14 DIAGNOSIS — Z87891 Personal history of nicotine dependence: Secondary | ICD-10-CM | POA: Diagnosis not present

## 2019-04-14 DIAGNOSIS — I358 Other nonrheumatic aortic valve disorders: Secondary | ICD-10-CM | POA: Diagnosis not present

## 2019-04-14 DIAGNOSIS — R404 Transient alteration of awareness: Secondary | ICD-10-CM | POA: Diagnosis not present

## 2019-04-14 DIAGNOSIS — I959 Hypotension, unspecified: Secondary | ICD-10-CM | POA: Diagnosis not present

## 2019-04-14 DIAGNOSIS — G9389 Other specified disorders of brain: Secondary | ICD-10-CM | POA: Diagnosis not present

## 2019-04-14 DIAGNOSIS — R748 Abnormal levels of other serum enzymes: Secondary | ICD-10-CM | POA: Diagnosis not present

## 2019-04-14 DIAGNOSIS — I444 Left anterior fascicular block: Secondary | ICD-10-CM | POA: Diagnosis present

## 2019-04-14 DIAGNOSIS — G4733 Obstructive sleep apnea (adult) (pediatric): Secondary | ICD-10-CM | POA: Diagnosis not present

## 2019-04-14 DIAGNOSIS — R29898 Other symptoms and signs involving the musculoskeletal system: Secondary | ICD-10-CM | POA: Diagnosis not present

## 2019-04-14 DIAGNOSIS — E1140 Type 2 diabetes mellitus with diabetic neuropathy, unspecified: Secondary | ICD-10-CM | POA: Diagnosis not present

## 2019-04-14 DIAGNOSIS — Z993 Dependence on wheelchair: Secondary | ICD-10-CM | POA: Diagnosis not present

## 2019-04-14 DIAGNOSIS — R9431 Abnormal electrocardiogram [ECG] [EKG]: Secondary | ICD-10-CM | POA: Diagnosis present

## 2019-04-14 DIAGNOSIS — Z20828 Contact with and (suspected) exposure to other viral communicable diseases: Secondary | ICD-10-CM | POA: Diagnosis not present

## 2019-04-14 DIAGNOSIS — R092 Respiratory arrest: Secondary | ICD-10-CM | POA: Diagnosis not present

## 2019-04-14 DIAGNOSIS — J439 Emphysema, unspecified: Secondary | ICD-10-CM | POA: Diagnosis not present

## 2019-04-14 DIAGNOSIS — J449 Chronic obstructive pulmonary disease, unspecified: Secondary | ICD-10-CM | POA: Diagnosis present

## 2019-04-14 DIAGNOSIS — R1013 Epigastric pain: Secondary | ICD-10-CM | POA: Diagnosis not present

## 2019-04-14 DIAGNOSIS — E872 Acidosis: Secondary | ICD-10-CM | POA: Diagnosis not present

## 2019-04-14 DIAGNOSIS — Z9119 Patient's noncompliance with other medical treatment and regimen: Secondary | ICD-10-CM | POA: Diagnosis not present

## 2019-04-14 DIAGNOSIS — F0281 Dementia in other diseases classified elsewhere with behavioral disturbance: Secondary | ICD-10-CM | POA: Diagnosis not present

## 2019-04-14 DIAGNOSIS — R918 Other nonspecific abnormal finding of lung field: Secondary | ICD-10-CM | POA: Diagnosis not present

## 2019-04-14 DIAGNOSIS — R531 Weakness: Secondary | ICD-10-CM | POA: Diagnosis not present

## 2019-04-14 DIAGNOSIS — R55 Syncope and collapse: Secondary | ICD-10-CM | POA: Diagnosis not present

## 2019-04-14 DIAGNOSIS — Z7984 Long term (current) use of oral hypoglycemic drugs: Secondary | ICD-10-CM | POA: Diagnosis not present

## 2019-04-14 DIAGNOSIS — F0150 Vascular dementia without behavioral disturbance: Secondary | ICD-10-CM | POA: Diagnosis not present

## 2019-04-18 ENCOUNTER — Encounter: Payer: Self-pay | Admitting: Family

## 2019-04-18 ENCOUNTER — Other Ambulatory Visit: Payer: Self-pay | Admitting: Family Medicine

## 2019-04-18 ENCOUNTER — Ambulatory Visit (INDEPENDENT_AMBULATORY_CARE_PROVIDER_SITE_OTHER): Payer: Medicare Other | Admitting: Family

## 2019-04-18 ENCOUNTER — Other Ambulatory Visit: Payer: Self-pay

## 2019-04-18 DIAGNOSIS — B370 Candidal stomatitis: Secondary | ICD-10-CM

## 2019-04-18 MED ORDER — ALBUTEROL SULFATE (2.5 MG/3ML) 0.083% IN NEBU
2.50 | INHALATION_SOLUTION | RESPIRATORY_TRACT | Status: DC
Start: ? — End: 2019-04-18

## 2019-04-18 MED ORDER — NITROGLYCERIN 0.4 MG SL SUBL
0.40 | SUBLINGUAL_TABLET | SUBLINGUAL | Status: DC
Start: ? — End: 2019-04-18

## 2019-04-18 MED ORDER — NYSTATIN 100000 UNIT/ML MT SUSP: 5.0000 mL | Freq: Four times a day (QID) | OROMUCOSAL | 1 refills | Status: DC

## 2019-04-18 MED ORDER — PANTOPRAZOLE SODIUM 20 MG PO TBEC
20.00 | DELAYED_RELEASE_TABLET | ORAL | Status: DC
Start: 2019-04-18 — End: 2019-04-18

## 2019-04-18 MED ORDER — TAMSULOSIN HCL 0.4 MG PO CAPS
0.40 | ORAL_CAPSULE | ORAL | Status: DC
Start: 2019-04-18 — End: 2019-04-18

## 2019-04-18 MED ORDER — CITALOPRAM HYDROBROMIDE 20 MG PO TABS
20.00 | ORAL_TABLET | ORAL | Status: DC
Start: 2019-04-18 — End: 2019-04-18

## 2019-04-18 MED ORDER — ASPIRIN EC 81 MG PO TBEC
81.00 | DELAYED_RELEASE_TABLET | ORAL | Status: DC
Start: 2019-04-18 — End: 2019-04-18

## 2019-04-18 MED ORDER — GENERIC EXTERNAL MEDICATION
Status: DC
Start: ? — End: 2019-04-18

## 2019-04-18 MED ORDER — ALUM & MAG HYDROXIDE-SIMETH 200-200-20 MG/5ML PO SUSP
30.00 | ORAL | Status: DC
Start: ? — End: 2019-04-18

## 2019-04-18 MED ORDER — LABETALOL HCL 5 MG/ML IV SOLN
10.00 | INTRAVENOUS | Status: DC
Start: ? — End: 2019-04-18

## 2019-04-18 MED ORDER — SODIUM CHLORIDE 0.9 % IV SOLN
10.00 | INTRAVENOUS | Status: DC
Start: ? — End: 2019-04-18

## 2019-04-18 MED ORDER — ACETAMINOPHEN 325 MG PO TABS
650.00 | ORAL_TABLET | ORAL | Status: DC
Start: ? — End: 2019-04-18

## 2019-04-18 MED ORDER — POLYETHYLENE GLYCOL 3350 17 G PO PACK
17.00 | PACK | ORAL | Status: DC
Start: ? — End: 2019-04-18

## 2019-04-18 MED ORDER — GENERIC EXTERNAL MEDICATION
2.00 | Status: DC
Start: 2019-04-18 — End: 2019-04-18

## 2019-04-18 MED ORDER — CLOTRIMAZOLE-BETAMETHASONE 1-0.05 % EX CREA
TOPICAL_CREAM | CUTANEOUS | Status: DC
Start: 2019-04-17 — End: 2019-04-18

## 2019-04-18 MED ORDER — GABAPENTIN 300 MG PO CAPS
300.00 | ORAL_CAPSULE | ORAL | Status: DC
Start: 2019-04-17 — End: 2019-04-18

## 2019-04-18 MED ORDER — MUPIROCIN 2 % EX OINT
TOPICAL_OINTMENT | CUTANEOUS | Status: DC
Start: 2019-04-17 — End: 2019-04-18

## 2019-04-18 MED ORDER — ACETAMINOPHEN 650 MG RE SUPP
650.00 | RECTAL | Status: DC
Start: ? — End: 2019-04-18

## 2019-04-18 MED ORDER — RIVASTIGMINE TARTRATE 1.5 MG PO CAPS
4.50 | ORAL_CAPSULE | ORAL | Status: DC
Start: 2019-04-17 — End: 2019-04-18

## 2019-04-18 MED ORDER — GENERIC EXTERNAL MEDICATION
1.25 | Status: DC
Start: 2019-04-18 — End: 2019-04-18

## 2019-04-18 MED ORDER — FLUCONAZOLE 150 MG PO TABS: 150.0000 mg | ORAL_TABLET | ORAL | 0 refills | Status: DC | PRN

## 2019-04-18 NOTE — Progress Notes (Signed)
   Virtual Visit via telephone Note Due to COVID-19 pandemic this visit was conducted virtually. This visit type was conducted due to national recommendations for restrictions regarding the COVID-19 Pandemic (e.g. social distancing, sheltering in place) in an effort to limit this patient's exposure and mitigate transmission in our community. All issues noted in this document were discussed and addressed.  A physical exam was not performed with this format.  I connected with Jon [name] on 04/18/19 at 4:39 pm by telephone and verified that I am speaking with the correct person using two identifiers. Jon [name] is currently located at home and his wife  is currently with him  during visit. The provider, Evelina Dun, FNP is located in their office at time of visit.  I discussed the limitations, risks, security and privacy concerns of performing an evaluation and management service by telephone and the availability of in person appointments. I also discussed with the patient that there may be a patient responsible charge related to this service. The patient expressed understanding and agreed to proceed.   History and Present Illness:  HPI  Wife and husband call today mouth soreness and white patches on tongue and roof of mouth that they noticed last week ago. He is having a hard time eating because of the soreness. He has dementia and is not a good historian.    Review of Systems  Unable to perform ROS: Dementia     Observations/Objective: No SOB or distress noted. Wife did all the talking as patient has dementia.   Assessment and Plan: 1. Oral thrush Keep mouth clean and dry Use sponge and put nystatin on tongue, cheeks, and roof of mouth Call if symptoms worsen or do not improve  - fluconazole (DIFLUCAN) 150 MG tablet; Take 1 tablet (150 mg total) by mouth every three (3) days as needed.  Dispense: 4 tablet; Refill: 0 - nystatin (MYCOSTATIN) 100000 UNIT/ML suspension; Take 5 mLs  (500,000 Units total) by mouth 4 (four) times daily.  Dispense: 473 mL; Refill: 1     I discussed the assessment and treatment plan with the patient. The patient was provided an opportunity to ask questions and all were answered. The patient agreed with the plan and demonstrated an understanding of the instructions.   The patient was advised to call back or seek an in-person evaluation if the symptoms worsen or if the condition fails to improve as anticipated.  The above assessment and management plan was discussed with the patient. The patient verbalized understanding of and has agreed to the management plan. Patient is aware to call the clinic if symptoms persist or worsen. Patient is aware when to return to the clinic for a follow-up visit. Patient educated on when it is appropriate to go to the emergency department.   Time call ended:  4:48 pm   I provided 9 minutes of non-face-to-face time during this encounter.    Evelina Dun, FNP

## 2019-04-18 NOTE — Patient Instructions (Signed)
Oral Thrush, Adult  Oral thrush, also called oral candidiasis, is a fungal infection that develops in the mouth and throat and on the tongue. It causes white patches to form on the mouth and tongue. Thrush is most common in older adults, but it can occur at any age. Many cases of thrush are mild, but this infection can also be serious. Thrush can be a repeated (recurrent) problem for certain people who have a weak body defense system (immune system). The weakness can be caused by chronic illnesses, or by taking medicines that limit the body's ability to fight infection. If a person has difficulty fighting infection, the fungus that causes thrush can spread through the body. This can cause life-threatening blood or organ infections. What are the causes? This condition is caused by a fungus (yeast) called Candida albicans.  This fungus is normally present in small amounts in the mouth and on other mucous membranes. It usually causes no harm.  If conditions are present that allow the fungus to grow without control, it invades surrounding tissues and becomes an infection.  Other Candida species can also lead to thrush (rare). What increases the risk? This condition is more likely to develop in:  People with a weakened immune system.  Older adults.  People with HIV (human immunodeficiency virus).  People with diabetes.  People with dry mouth (xerostomia).  Pregnant women.  People with poor dental care, especially people who have false teeth.  People who use antibiotic medicines. What are the signs or symptoms? Symptoms of this condition can vary from mild and moderate to severe and persistent. Symptoms may include:  A burning feeling in the mouth and throat. This can occur at the start of a thrush infection.  White patches that stick to the mouth and tongue. The tissue around the patches may be red, raw, and painful. If rubbed (during tooth brushing, for example), the patches and the  tissue of the mouth may bleed easily.  A bad taste in the mouth or difficulty tasting foods.  A cottony feeling in the mouth.  Pain during eating and swallowing.  Poor appetite.  Cracking at the corners of the mouth. How is this diagnosed? This condition is diagnosed based on:  Physical exam. Your health care provider will look in your mouth.  Health history. Your health care provider will ask you questions about your health. How is this treated? This condition is treated with medicines called antifungals, which prevent the growth of fungi. These medicines are either applied directly to the affected area (topical) or swallowed (oral). The treatment will depend on the severity of the condition. Mild thrush Mild cases of thrush may clear up with the use of an antifungal mouth rinse or lozenges. Treatment usually lasts about 14 days. Moderate to severe thrush  More severe thrush infections that have spread to the esophagus are treated with an oral antifungal medicine. A topical antifungal medicine may also be used.  For some severe infections, treatment may need to continue for more than 14 days.  Oral antifungal medicines are rarely used during pregnancy because they may be harmful to the unborn child. If you are pregnant, talk with your health care provider about options for treatment. Persistent or recurrent thrush For cases of thrush that do not go away or keep coming back:  Treatment may be needed twice as long as the symptoms last.  Treatment will include both oral and topical antifungal medicines.  People with a weakened immune system can take   an antifungal medicine on a continuous basis to prevent thrush infections. It is important to treat conditions that make a person more likely to get thrush, such as diabetes or HIV. Follow these instructions at home: Medicines  Take over-the-counter and prescription medicines only as told by your health care provider.  Talk with  your health care provider about an over-the-counter medicine called gentian violet, which kills bacteria and fungi. Relieving soreness and discomfort To help reduce the discomfort of thrush:  Drink cold liquids such as water or iced tea.  Try flavored ice treats or frozen juices.  Eat foods that are easy to swallow, such as gelatin, ice cream, or custard.  Try drinking from a straw if the patches in your mouth are painful.  General instructions  Eat plain, unflavored yogurt as directed by your health care provider. Check the label to make sure the yogurt contains live cultures. This yogurt can help healthy bacteria to grow in the mouth and can stop the growth of the fungus that causes thrush.  If you wear dentures, remove the dentures before going to bed, brush them vigorously, and soak them in a cleaning solution as directed by your health care provider.  Rinse your mouth with a warm salt-water mixture several times a day. To make a salt-water mixture, completely dissolve 1/2-1 tsp of salt in 1 cup of warm water. Contact a health care provider if:  Your symptoms are getting worse or are not improving within 7 days of starting treatment.  You have symptoms of a spreading infection, such as white patches on the skin outside of the mouth. This information is not intended to replace advice given to you by your health care provider. Make sure you discuss any questions you have with your health care provider. Document Released: 03/14/2004 Document Revised: 09/21/2017 Document Reviewed: 03/13/2016 Elsevier Patient Education  2020 [employer]  

## 2019-04-23 ENCOUNTER — Telehealth: Payer: Self-pay | Admitting: Family Medicine

## 2019-04-23 ENCOUNTER — Other Ambulatory Visit: Payer: Self-pay

## 2019-04-23 ENCOUNTER — Other Ambulatory Visit: Payer: Medicare Other

## 2019-04-23 DIAGNOSIS — R82998 Other abnormal findings in urine: Secondary | ICD-10-CM

## 2019-04-24 ENCOUNTER — Other Ambulatory Visit: Payer: Self-pay | Admitting: Family Medicine

## 2019-04-24 DIAGNOSIS — R339 Retention of urine, unspecified: Secondary | ICD-10-CM | POA: Diagnosis not present

## 2019-04-24 DIAGNOSIS — Z87891 Personal history of nicotine dependence: Secondary | ICD-10-CM | POA: Diagnosis not present

## 2019-04-24 DIAGNOSIS — R319 Hematuria, unspecified: Secondary | ICD-10-CM | POA: Diagnosis not present

## 2019-04-24 DIAGNOSIS — J8410 Pulmonary fibrosis, unspecified: Secondary | ICD-10-CM | POA: Diagnosis not present

## 2019-04-24 DIAGNOSIS — Z049 Encounter for examination and observation for unspecified reason: Secondary | ICD-10-CM | POA: Diagnosis not present

## 2019-04-24 DIAGNOSIS — I959 Hypotension, unspecified: Secondary | ICD-10-CM | POA: Diagnosis not present

## 2019-04-24 DIAGNOSIS — R4182 Altered mental status, unspecified: Secondary | ICD-10-CM | POA: Diagnosis not present

## 2019-04-24 DIAGNOSIS — R55 Syncope and collapse: Secondary | ICD-10-CM | POA: Diagnosis not present

## 2019-04-24 DIAGNOSIS — J439 Emphysema, unspecified: Secondary | ICD-10-CM | POA: Diagnosis not present

## 2019-04-24 LAB — URINALYSIS, COMPLETE
Bilirubin, UA: NEGATIVE
Glucose, UA: NEGATIVE
Ketones, UA: NEGATIVE
Nitrite, UA: NEGATIVE
Specific Gravity, UA: 1.009 (ref 1.005–1.030)
Urobilinogen, Ur: 1 mg/dL (ref 0.2–1.0)
pH, UA: 6.5 (ref 5.0–7.5)

## 2019-04-24 LAB — MICROSCOPIC EXAMINATION
Casts: NONE SEEN /lpf
RBC, Urine: >30 /hpf — AB (ref 0–2)

## 2019-04-24 LAB — URINE CULTURE: Organism ID, Bacteria: NO GROWTH

## 2019-04-24 MED ORDER — CIPROFLOXACIN HCL 500 MG PO TABS: 500.0000 mg | ORAL_TABLET | Freq: Two times a day (BID) | ORAL | 0 refills | Status: DC

## 2019-04-24 NOTE — Telephone Encounter (Signed)
Patient aware and verbalized understanding that results have not been reviewed yet we will call once reviewed.

## 2019-05-05 ENCOUNTER — Ambulatory Visit: Payer: Medicare Other | Admitting: Neurology

## 2019-05-15 ENCOUNTER — Telehealth: Payer: Self-pay | Admitting: Family Medicine

## 2019-05-15 NOTE — Chronic Care Management (AMB) (Signed)
Chronic Care Management   Note  05/15/2019 Name: Jon [name] MRN: [id number] DOB: 29-Mar-1946  Jon [name] is a 73 y.o. year old male who is a primary care patient of Stacks, Cletus Gash, MD. I reached out to [name] by phone today in response to a referral sent by [name] health plan.     [name] was given information about Chronic Care Management services today including:  1. CCM service includes personalized support from designated clinical staff supervised by his physician, including individualized plan of care and coordination with other care providers 2. 24/7 contact phone numbers for assistance for urgent and routine care needs. 3. Service will only be billed when office clinical staff spend 20 minutes or more in a month to coordinate care. 4. Only one practitioner may furnish and bill the service in a calendar month. 5. The patient may stop CCM services at any time (effective at the end of the month) by phone call to the office staff. 6. The patient will be responsible for cost sharing (co-pay) of up to 20% of the service fee (after annual deductible is met).  Patients wife [name] did not agree to enrollment in care management services and does not wish to consider at this time.  Follow up plan: The patient has been provided with contact information for the chronic care management team and has been advised to call with any health related questions or concerns.   [place], [place] [id number] Direct Dial: [email or web address]  Website: [email or web address]

## 2019-05-16 DIAGNOSIS — R55 Syncope and collapse: Secondary | ICD-10-CM | POA: Diagnosis not present

## 2019-06-11 ENCOUNTER — Other Ambulatory Visit: Payer: Self-pay | Admitting: Family Medicine

## 2019-06-11 NOTE — Telephone Encounter (Signed)
Stacks. NTBS for 3 mos ckup. Mail order refill sent 

## 2019-06-12 ENCOUNTER — Telehealth: Payer: Self-pay | Admitting: *Deleted

## 2019-06-12 MED ORDER — GABAPENTIN 300 MG PO CAPS: 300.0000 mg | ORAL_CAPSULE | Freq: Three times a day (TID) | ORAL | 0 refills | Status: DC

## 2019-06-12 NOTE — Telephone Encounter (Signed)
Stacks. NTBS for 3 mos ckup. Mail order refill sent

## 2019-06-25 ENCOUNTER — Telehealth (INDEPENDENT_AMBULATORY_CARE_PROVIDER_SITE_OTHER): Payer: Medicare Other | Admitting: Family Medicine

## 2019-06-25 DIAGNOSIS — E1165 Type 2 diabetes mellitus with hyperglycemia: Secondary | ICD-10-CM | POA: Diagnosis not present

## 2019-06-25 DIAGNOSIS — F0281 Dementia in other diseases classified elsewhere with behavioral disturbance: Secondary | ICD-10-CM

## 2019-06-25 DIAGNOSIS — E118 Type 2 diabetes mellitus with unspecified complications: Secondary | ICD-10-CM | POA: Diagnosis not present

## 2019-06-25 DIAGNOSIS — G3183 Dementia with Lewy bodies: Secondary | ICD-10-CM | POA: Diagnosis not present

## 2019-06-25 DIAGNOSIS — R5381 Other malaise: Secondary | ICD-10-CM | POA: Diagnosis not present

## 2019-06-25 DIAGNOSIS — G20 Parkinson's disease: Secondary | ICD-10-CM

## 2019-06-25 DIAGNOSIS — Z794 Long term (current) use of insulin: Secondary | ICD-10-CM | POA: Diagnosis not present

## 2019-06-25 DIAGNOSIS — IMO0002 Reserved for concepts with insufficient information to code with codable children: Secondary | ICD-10-CM

## 2019-06-30 NOTE — Progress Notes (Signed)
Virtual Visit via telephone Note  I connected with Jon [name] and his wife by  Telemedicine video and verified that I was speaking with the correct person using two identifiers. Jon [name] is currently located at home and wife is currently with him during visit. The provider, Claretta Fraise, MD is located in his home office at time of visit.  I discussed the limitations, risks, security and privacy concerns of performing an evaluation and management service by telephone and the availability of in person appointments. I also discussed with the patient that there may be a patient responsible charge related to this service. The patient expressed understanding and agreed to proceed.   History of Present Illness  Pt. Is weak, getting weaker. He needs assistance for transfers. His weakness is making this harder for his caregiver. She can not adequately support him. Additionally he is having difficulty with the fine movements of his hands so that he can not hold a utensil and can drop a drinking glass etc. Condition has become significantly worse over the last several weeks. HE suffers from Parkinsonism. This is the main source of his weakness.   Allergies  Allergen Reactions  . Donepezil Nausea And Vomiting  . Namenda [Memantine Hcl] Other (See Comments)    Sleeps all the time  . Phenergan [Promethazine Hcl] Other (See Comments)    Aggressive      Outpatient Encounter Medications as of 06/25/2019  Medication Sig  . Acetaminophen (TYLENOL ARTHRITIS EXT RELIEF PO) Take by mouth.  [name] aspirin 81 MG tablet Take 81 mg by mouth daily.  . blood glucose meter kit and supplies Patient requests a Freestyle Meter. Use up to four times daily as directed. (FOR ICD-10 E10.9, E11.9).  [name] Blood Glucose Monitoring Suppl (FREESTYLE FREEDOM LITE) w/Device KIT USE TO CHECK FASTING AND 2 HOURS AFTER BIGGEST MEAL Dx E11.8  . Cholecalciferol (VITAMIN D) 50 MCG (2000 UT) CAPS Take 1 capsule by mouth daily.  .  ciprofloxacin (CIPRO) 500 MG tablet Take 1 tablet (500 mg total) by mouth 2 (two) times daily. For prostate. Take all of these.  . citalopram (CELEXA) 10 MG tablet Take 10 mg by mouth daily.  . fluconazole (DIFLUCAN) 150 MG tablet Take 1 tablet (150 mg total) by mouth every three (3) days as needed.  . gabapentin (NEURONTIN) 300 MG capsule Take 1 capsule (300 mg total) by mouth 3 (three) times daily.  [name] glucose blood (FREESTYLE LITE) test strip USE TO CHECK FASTING AND 2 HOURS AFTER BIGGEST MEAL  . megestrol (MEGACE) 400 MG/10ML suspension Take 10 mLs (400 mg total) by mouth 2 (two) times daily. For appetite stimulation  . metFORMIN (GLUCOPHAGE) 1000 MG tablet TAKE 1 TABLET DAILY WITH BREAKFAST  . nystatin (MYCOSTATIN) 100000 UNIT/ML suspension Take 5 mLs (500,000 Units total) by mouth 4 (four) times daily.  [name] omeprazole (PRILOSEC) 20 MG capsule TAKE 1 CAPSULE DAILY  . rivastigmine (EXELON) 4.5 MG capsule TAKE 1 CAPSULE TWICE A DAY  . rOPINIRole (REQUIP) 0.25 MG tablet Take 1 tablet (0.25 mg total) by mouth 3 (three) times daily.  . SURE COMFORT PEN NEEDLES 32G X 6 MM MISC USE AS DIRECTED  . tamsulosin (FLOMAX) 0.4 MG CAPS capsule Take by mouth.   No facility-administered encounter medications on file as of 06/25/2019.    Past Medical History:  Diagnosis Date  . AAA (abdominal aortic aneurysm) ([place])   . Alzheimer disease ([place])   . Anemia   . Ataxia   .  Diabetes mellitus without complication ([place])   . History of gallstones   . Hyperlipidemia   . Hypertension   . Vitamin D deficiency     Past Surgical History:  Procedure Laterality Date  . ERCP    . INGUINAL HERNIA REPAIR    . TONSILLECTOMY      Social History   Socioeconomic History  . Marital status: Married    Spouse name: [name]  . Number of children: 1  . Years of education: 85  . Highest education level: High school graduate  Occupational History  . Occupation: retired  Tobacco Use  . Smoking status: Former Smoker      Packs/day: 1.00    Years: 40.00    Pack years: 40.00    Types: Cigarettes    Start date: 07/03/1966    Quit date: 02/06/2009    Years since quitting: 10.4  . Smokeless tobacco: Never Used  Substance and Sexual Activity  . Alcohol use: No    Alcohol/week: 0.0 standard drinks  . Drug use: No  . Sexual activity: Not Currently  Other Topics Concern  . Not on file  Social History Narrative  . Not on file   Social Determinants of Health   Financial Resource Strain: Low Risk   . Difficulty of Paying Living Expenses: Not hard at all  Food Insecurity: No Food Insecurity  . Worried About Charity fundraiser in the Last Year: Never true  . Ran Out of Food in the Last Year: Never true  Transportation Needs: No Transportation Needs  . Lack of Transportation (Medical): No  . Lack of Transportation (Non-Medical): No  Physical Activity: Inactive  . Days of Exercise per Week: 0 days  . Minutes of Exercise per Session: 0 min  Stress: No Stress Concern Present  . Feeling of Stress : Only a little  Social Connections: Somewhat Isolated  . Frequency of Communication with Friends and Family: Twice a week  . Frequency of Social Gatherings with Friends and Family: Twice a week  . Attends Religious Services: Never  . Active Member of Clubs or Organizations: No  . Attends Archivist Meetings: Never  . Marital Status: Married  Human resources officer Violence: Not At Risk  . Fear of Current or Ex-Partner: No  . Emotionally Abused: No  . Physically Abused: No  . Sexually Abused: No   Review of Systems  Constitutional: Negative for fever.  Respiratory: Positive for shortness of breath.   Cardiovascular: Negative for chest pain.  Musculoskeletal: Positive for arthralgias, gait problem and myalgias.  Skin: Negative for rash.  Neurological: Positive for weakness.  Psychiatric/Behavioral: Positive for confusion.       Assessment and Plan: 1. Primary Parkinsonism ([place])   2. Uncontrolled  type 2 diabetes mellitus with complication, with long-term current use of insulin ([place])   3. Lewy body dementia with behavioral disturbance ([place])   4. Physical debility     No orders of the defined types were placed in this encounter.    Follow Up Instructions:     I discussed the assessment and treatment plan with the patient. The patient was provided an opportunity to ask questions and all were answered. The patient agreed with the plan and demonstrated an understanding of the instructions.   The patient was advised to call back or seek an in-person evaluation if the symptoms worsen or if the condition fails to improve as anticipated.  The above assessment and management plan was discussed with the patient. The  patient verbalized understanding of and has agreed to the management plan. Patient is aware to call the clinic if symptoms persist or worsen. Patient is aware when to return to the clinic for a follow-up visit. Patient educated on when it is appropriate to go to the emergency department.    I provided 27 minutes of non-face-to-face time during this encounter. Claretta Fraise, MD

## 2019-07-09 ENCOUNTER — Other Ambulatory Visit: Payer: Self-pay | Admitting: Family Medicine

## 2019-07-09 ENCOUNTER — Ambulatory Visit: Payer: Medicare Other | Admitting: *Deleted

## 2019-07-09 DIAGNOSIS — F0281 Dementia in other diseases classified elsewhere with behavioral disturbance: Secondary | ICD-10-CM

## 2019-07-09 DIAGNOSIS — E1165 Type 2 diabetes mellitus with hyperglycemia: Secondary | ICD-10-CM

## 2019-07-09 DIAGNOSIS — G20 Parkinson's disease: Secondary | ICD-10-CM

## 2019-07-09 DIAGNOSIS — IMO0002 Reserved for concepts with insufficient information to code with codable children: Secondary | ICD-10-CM

## 2019-07-09 DIAGNOSIS — G3183 Dementia with Lewy bodies: Secondary | ICD-10-CM

## 2019-07-09 DIAGNOSIS — F02818 Dementia in other diseases classified elsewhere, unspecified severity, with other behavioral disturbance: Secondary | ICD-10-CM

## 2019-07-09 DIAGNOSIS — R5381 Other malaise: Secondary | ICD-10-CM

## 2019-07-09 NOTE — Patient Instructions (Signed)
Jon [name] was given information about Chronic Care Management services today including:  1. CCM service includes personalized support from designated clinical staff supervised by his physician, including individualized plan of care and coordination with other care providers 2. 24/7 contact phone numbers for assistance for urgent and routine care needs. 3. Service will only be billed when office clinical staff spend 20 minutes or more in a month to coordinate care. 4. Only one practitioner may furnish and bill the service in a calendar month. 5. The patient may stop CCM services at any time (effective at the end of the month) by phone call to the office staff. 6. The patient will be responsible for cost sharing (co-pay) of up to 20% of the service fee (after annual deductible is met).  Patient did not agree to services and wishes to consider information provided before deciding about enrollment in care management services.   Chong Sicilian, BSN, RN-BC Embedded Chronic Care Manager [facility] [place] Management Direct Dial: [phone]

## 2019-07-09 NOTE — Chronic Care Management (AMB) (Signed)
Care Management   Initial Visit Note  07/09/2019 Name: Jon [name] MRN: [id number] DOB: Jan 02, 1946  Referred by: Jon Fraise, MD Reason for referral : Chronic Care Management (RN Initial Outreach)   Jon [name] is a 74 y.o. year old male who is a primary care patient of Jon [name], Jon Gash, MD. The CCM team was consulted for assistance with chronic disease management and care coordination needs related to hypertension, diabetes, parkinson's disease, lew body dementia.   Review of patient status, including review of consultants reports, relevant laboratory and other test results, and collaboration with appropriate care team members and the patient's provider was performed as part of comprehensive patient evaluation and provision of chronic care management services.    I spoke with patient's wife, Jon [name], who will consider CCM services. She requested home health PT and is agreeable to talking with palliative care services.   Objective: Outpatient Encounter Medications as of 07/09/2019  Medication Sig  . Acetaminophen (TYLENOL ARTHRITIS EXT RELIEF PO) Take by mouth.  [name] aspirin 81 MG tablet Take 81 mg by mouth daily.  . blood glucose meter kit and supplies Patient requests a Freestyle Meter. Use up to four times daily as directed. (FOR ICD-10 E10.9, E11.9).  [name] Blood Glucose Monitoring Suppl (FREESTYLE FREEDOM LITE) w/Device KIT USE TO CHECK FASTING AND 2 HOURS AFTER BIGGEST MEAL Dx E11.8  . Cholecalciferol (VITAMIN D) 50 MCG (2000 UT) CAPS Take 1 capsule by mouth daily.  . ciprofloxacin (CIPRO) 500 MG tablet Take 1 tablet (500 mg total) by mouth 2 (two) times daily. For prostate. Take all of these.  . citalopram (CELEXA) 10 MG tablet Take 10 mg by mouth daily.  . fluconazole (DIFLUCAN) 150 MG tablet Take 1 tablet (150 mg total) by mouth every three (3) days as needed.  . gabapentin (NEURONTIN) 300 MG capsule Take 1 capsule (300 mg total) by mouth 3 (three) times daily.  [name] glucose blood  (FREESTYLE LITE) test strip USE TO CHECK FASTING AND 2 HOURS AFTER BIGGEST MEAL  . megestrol (MEGACE) 400 MG/10ML suspension Take 10 mLs (400 mg total) by mouth 2 (two) times daily. For appetite stimulation  . metFORMIN (GLUCOPHAGE) 1000 MG tablet TAKE 1 TABLET DAILY WITH BREAKFAST  . nystatin (MYCOSTATIN) 100000 UNIT/ML suspension Take 5 mLs (500,000 Units total) by mouth 4 (four) times daily.  [name] omeprazole (PRILOSEC) 20 MG capsule TAKE 1 CAPSULE DAILY  . rivastigmine (EXELON) 4.5 MG capsule TAKE 1 CAPSULE TWICE A DAY  . rOPINIRole (REQUIP) 0.25 MG tablet Take 1 tablet (0.25 mg total) by mouth 3 (three) times daily.  . SURE COMFORT PEN NEEDLES 32G X 6 MM MISC USE AS DIRECTED  . tamsulosin (FLOMAX) 0.4 MG CAPS capsule Take by mouth.   No facility-administered encounter medications on file as of 07/09/2019.    BP Readings from Last 3 Encounters:  02/25/19 (!) 86/68  02/05/19 102/75  08/23/18 108/75    Patient Active Problem List   Diagnosis Date Noted  . Primary Parkinsonism ([place]) 11/20/2018  . Hypoxia 08/23/2018  . Calculus of gallbladder without cholecystitis without obstruction   . Intractable nausea and vomiting 08/12/2018  . Elevated LFTs 08/12/2018  . Lewy body dementia with behavioral disturbance ([place]) 03/12/2018  . Muscular incoordination 03/26/2017  . Atherosclerosis of artery of both lower extremities ([place]) 03/20/2017  . OSA (obstructive sleep apnea) 04/11/2016  . ILD (interstitial lung disease) ([place]) 04/11/2016  . Abnormal chest CT 01/25/2016  . Lumbar compression fracture ([place]) 01/25/2016  .  Bullous emphysema ([place]) 09/28/2015  . Microalbuminuria due to type 2 diabetes mellitus ([place]) 03/12/2015  . Mild cognitive disorder 05/15/2013  . Vitamin D deficiency   . Hyperlipidemia 03/19/2013  . Uncontrolled type 2 diabetes mellitus with complication, with long-term current use of insulin ([place]) 02/06/2013  . HTN (hypertension) 02/06/2013  . AAA (abdominal aortic aneurysm)  without rupture ([place]) 02/06/2013  . Clubbing of fingers 02/06/2013  . Ex-smoker 02/06/2013  . History of unsteady gait 02/06/2013    Consent [name] was given information about Chronic Care Management services today including:  1. CCM service includes personalized support from designated clinical staff supervised by his physician, including individualized plan of care and coordination with other care providers 2. 24/7 contact phone numbers for assistance for urgent and routine care needs. 3. Service will only be billed when office clinical staff spend 20 minutes or more in a month to coordinate care. 4. Only one practitioner may furnish and bill the service in a calendar month. 5. The patient may stop CCM services at any time (effective at the end of the month) by phone call to the office staff. 6. The patient will be responsible for cost sharing (co-pay) of up to 20% of the service fee (after annual deductible is met).  Patient did not agree to services and wishes to consider information provided before deciding about enrollment in care management services.    Follow-up Plan:  The care management team will reach out to the patient again over the next 30 days.    Patient/wife will reach out to CCM team as needed  Referral to Palliative Care Services  Referral to [facility] for PT  Jon [name], BSN, RN-BC [place] [facility] [place] Management Direct Dial: [phone]

## 2019-07-12 DIAGNOSIS — D649 Anemia, unspecified: Secondary | ICD-10-CM | POA: Diagnosis not present

## 2019-07-12 DIAGNOSIS — I714 Abdominal aortic aneurysm, without rupture: Secondary | ICD-10-CM | POA: Diagnosis not present

## 2019-07-12 DIAGNOSIS — Z9181 History of falling: Secondary | ICD-10-CM | POA: Diagnosis not present

## 2019-07-12 DIAGNOSIS — Z87891 Personal history of nicotine dependence: Secondary | ICD-10-CM | POA: Diagnosis not present

## 2019-07-12 DIAGNOSIS — G309 Alzheimer's disease, unspecified: Secondary | ICD-10-CM | POA: Diagnosis not present

## 2019-07-12 DIAGNOSIS — Z7984 Long term (current) use of oral hypoglycemic drugs: Secondary | ICD-10-CM | POA: Diagnosis not present

## 2019-07-12 DIAGNOSIS — E559 Vitamin D deficiency, unspecified: Secondary | ICD-10-CM | POA: Diagnosis not present

## 2019-07-12 DIAGNOSIS — Z7982 Long term (current) use of aspirin: Secondary | ICD-10-CM | POA: Diagnosis not present

## 2019-07-12 DIAGNOSIS — Z993 Dependence on wheelchair: Secondary | ICD-10-CM | POA: Diagnosis not present

## 2019-07-12 DIAGNOSIS — I10 Essential (primary) hypertension: Secondary | ICD-10-CM | POA: Diagnosis not present

## 2019-07-12 DIAGNOSIS — F0281 Dementia in other diseases classified elsewhere with behavioral disturbance: Secondary | ICD-10-CM | POA: Diagnosis not present

## 2019-07-12 DIAGNOSIS — G3183 Dementia with Lewy bodies: Secondary | ICD-10-CM | POA: Diagnosis not present

## 2019-07-12 DIAGNOSIS — E785 Hyperlipidemia, unspecified: Secondary | ICD-10-CM | POA: Diagnosis not present

## 2019-07-12 DIAGNOSIS — E1165 Type 2 diabetes mellitus with hyperglycemia: Secondary | ICD-10-CM | POA: Diagnosis not present

## 2019-07-14 ENCOUNTER — Telehealth: Payer: Self-pay | Admitting: Family Medicine

## 2019-07-14 NOTE — Telephone Encounter (Signed)
[facility] aware

## 2019-07-14 NOTE — Telephone Encounter (Signed)
That works well for me. Thanks  WS

## 2019-07-15 DIAGNOSIS — G309 Alzheimer's disease, unspecified: Secondary | ICD-10-CM | POA: Diagnosis not present

## 2019-07-15 DIAGNOSIS — G20 Parkinson's disease: Secondary | ICD-10-CM | POA: Diagnosis not present

## 2019-07-15 DIAGNOSIS — G3183 Dementia with Lewy bodies: Secondary | ICD-10-CM | POA: Diagnosis not present

## 2019-07-15 DIAGNOSIS — I10 Essential (primary) hypertension: Secondary | ICD-10-CM | POA: Diagnosis not present

## 2019-07-15 DIAGNOSIS — F0281 Dementia in other diseases classified elsewhere with behavioral disturbance: Secondary | ICD-10-CM | POA: Diagnosis not present

## 2019-07-15 DIAGNOSIS — E1165 Type 2 diabetes mellitus with hyperglycemia: Secondary | ICD-10-CM | POA: Diagnosis not present

## 2019-07-15 DIAGNOSIS — I714 Abdominal aortic aneurysm, without rupture: Secondary | ICD-10-CM | POA: Diagnosis not present

## 2019-07-15 DIAGNOSIS — R5381 Other malaise: Secondary | ICD-10-CM | POA: Diagnosis not present

## 2019-07-15 DIAGNOSIS — M79601 Pain in right arm: Secondary | ICD-10-CM | POA: Diagnosis not present

## 2019-07-16 ENCOUNTER — Telehealth: Payer: Self-pay | Admitting: Family Medicine

## 2019-07-16 ENCOUNTER — Ambulatory Visit (INDEPENDENT_AMBULATORY_CARE_PROVIDER_SITE_OTHER): Payer: Medicare Other | Admitting: Family Medicine

## 2019-07-16 ENCOUNTER — Encounter: Payer: Self-pay | Admitting: Family Medicine

## 2019-07-16 DIAGNOSIS — R5381 Other malaise: Secondary | ICD-10-CM

## 2019-07-16 DIAGNOSIS — F0281 Dementia in other diseases classified elsewhere with behavioral disturbance: Secondary | ICD-10-CM | POA: Diagnosis not present

## 2019-07-16 DIAGNOSIS — G20 Parkinson's disease: Secondary | ICD-10-CM | POA: Diagnosis not present

## 2019-07-16 DIAGNOSIS — G3183 Dementia with Lewy bodies: Secondary | ICD-10-CM

## 2019-07-16 NOTE — Telephone Encounter (Signed)
Patients wife called and wanted to give Dr Darlyn Read a message from their conversation earlier. Per wife- Spoke to Dr Darlyn Read about getting home health aid. Another reason they need one, is because wife needs help getting patient to and from appts.

## 2019-07-16 NOTE — Telephone Encounter (Signed)
FYI

## 2019-07-16 NOTE — Progress Notes (Signed)
Subjective:    Patient ID: Jon [name], male    DOB: 1945-12-11, 74 y.o.   MRN: [id number]   HPI: ORANGE HILLIGOSS is a 74 y.o. male presenting for Pt. Hollers in pain if you go to move the right arm. Seems to be centered at the forearm near the wrist. He will move the arm from the shoulder slowly, but it is painful. No falling. Ongoing since last hospitalization. He is getting anew wheel chair tomorrow. Jon [name], wife, tells me she spoke to Medicare this morning. They should help with home health aid since he is under a nurses care. Jon [name] needs someone to help when she has to leave the home. Help with bathing and personal care. Jon [name] wears O2 and is limited herself with regard to ability to perform strenuous duties. He is bedridden. Takes a bed bath. Has to be turned. Sheets have to be changed. This is exhausting for his caregiver.    Depression screen [facility] 2/9 02/25/2019 02/14/2019 08/23/2018 05/03/2018 03/12/2018  Decreased Interest 0 0 0 0 0  Down, Depressed, Hopeless 0 0 0 0 0  PHQ - 2 Score 0 0 0 0 0  Altered sleeping - - - - -  Tired, decreased energy - - - - -  Change in appetite - - - - -  Feeling bad or failure about yourself  - - - - -  Trouble concentrating - - - - -  Moving slowly or fidgety/restless - - - - -  Suicidal thoughts - - - - -  PHQ-9 Score - - - - -     Relevant past medical, surgical, family and social history reviewed and updated as indicated.  Interim medical history since our last visit reviewed. Allergies and medications reviewed and updated.  ROS:  Review of Systems  Constitutional: Positive for fatigue.  HENT: Negative.   Eyes: Negative for visual disturbance.  Respiratory: Negative for cough.   Cardiovascular: Negative for leg swelling.  Gastrointestinal: Negative for abdominal pain, diarrhea, nausea and vomiting.  Musculoskeletal: Positive for gait problem (bedridden, up in chair with complete assistance only.). Negative for arthralgias and myalgias.   Skin: Negative for rash.  Neurological: Positive for weakness.  Psychiatric/Behavioral: Positive for confusion and decreased concentration. Negative for sleep disturbance.     Social History   Tobacco Use  Smoking Status Former Smoker  . Packs/day: 1.00  . Years: 40.00  . Pack years: 40.00  . Types: Cigarettes  . Start date: 07/03/1966  . Quit date: 02/06/2009  . Years since quitting: 10.4  Smokeless Tobacco Never Used       Objective:     Wt Readings from Last 3 Encounters:  08/12/18 178 lb 9.2 oz (81 kg)  05/03/18 178 lb 12.8 oz (81.1 kg)  04/08/18 195 lb (88.5 kg)     Exam deferred. Pt. [place] due to COVID 19. Phone visit performed.   Assessment & Plan:   1. Primary Parkinsonism ([place])   2. Lewy body dementia with behavioral disturbance ([place])   3. Physical debility        Diagnoses and all orders for this visit:  Primary Parkinsonism ([place])  Lewy body dementia with behavioral disturbance ([place])  Physical debility  I will make contact us with chronic care management and see if they can help [place] with getting a home health aide for him.  The arm pain is a bit of a mystery.  I think he is going to need to be seen  to evaluate that more carefully.  This should include an x-ray.  Fortunately the patient is going to be getting a new wheelchair which can be used to more readily convey him in a vehicle.  Therefore, his wife, Jon [name] will try to bring him into the office for evaluation including x-ray in the next couple of weeks.  Virtual Visit via telephone Note  I discussed the limitations, risks, security and privacy concerns of performing an evaluation and management service by telephone and the availability of in person appointments. The patient was identified with two identifiers. Pt.expressed understanding and agreed to proceed. Pt. Is at home. Dr. Darlyn Read is in his office.  Follow Up Instructions:   I discussed the assessment and treatment plan with the patient. The  patient was provided an opportunity to ask questions and all were answered. The patient agreed with the plan and demonstrated an understanding of the instructions.   The patient was advised to call back or seek an in-person evaluation if the symptoms worsen or if the condition fails to improve as anticipated.   Total minutes including chart review and phone contact time: 18   Follow up plan: Return in about 2 weeks (around 07/30/2019).  Mechele Claude, MD [place]

## 2019-07-17 ENCOUNTER — Ambulatory Visit: Payer: Medicare Other | Admitting: *Deleted

## 2019-07-17 DIAGNOSIS — G3183 Dementia with Lewy bodies: Secondary | ICD-10-CM | POA: Diagnosis not present

## 2019-07-17 DIAGNOSIS — G309 Alzheimer's disease, unspecified: Secondary | ICD-10-CM | POA: Diagnosis not present

## 2019-07-17 DIAGNOSIS — E1165 Type 2 diabetes mellitus with hyperglycemia: Secondary | ICD-10-CM | POA: Diagnosis not present

## 2019-07-17 DIAGNOSIS — I10 Essential (primary) hypertension: Secondary | ICD-10-CM | POA: Diagnosis not present

## 2019-07-17 DIAGNOSIS — I714 Abdominal aortic aneurysm, without rupture: Secondary | ICD-10-CM | POA: Diagnosis not present

## 2019-07-17 DIAGNOSIS — F0281 Dementia in other diseases classified elsewhere with behavioral disturbance: Secondary | ICD-10-CM

## 2019-07-17 DIAGNOSIS — G20 Parkinson's disease: Secondary | ICD-10-CM

## 2019-07-17 NOTE — Chronic Care Management (AMB) (Signed)
   Care Management   Follow Up Note   07/17/2019 Name: Jon [name] MRN: [id number] DOB: [birth year]  Referred by: Mechele Claude, MD Reason for referral : Chronic Care Management (Care Coordination)   Jon [name] is a 74 y.o. year old male who is a primary care patient of Stacks, Broadus John, MD. The CCM team was consulted for assistance with chronic disease management and care coordination needs.    Review of patient status, including review of consultants reports, relevant laboratory and other test results, and collaboration with appropriate care team members and the patient's provider was performed as part of comprehensive patient evaluation and provision of chronic care management services.      RN Assessment & Care Plan           This Visit's Progress   . Palliative Care Services       Palliative care services and in home care needed in patient with Parkinson's and Lewy Body Dementia  Current Barriers:  [name] Knowledge Deficits related to palliative/supportive care services  Nurse Case Manager Clinical Goal(s):  [name] Over the next 7 days, patient will have in home assessment by palliative care services  Interventions:  . Talked with wife by phone . Chart reviewed . Discussed palliative care services . Discussed HH services and in home-aides . Questions answered . Encouraged wife to reach out to [facility] team as needed  Patient Self Care Activities:  . Wife assists or performs ADLs/IADLs due to parkinson's and dementia   Initial goal documentation        Follow-up Plan Patient has not consented to CCM services due to cost. Wife provided with resources and information to assist her. CCM team will remain available if services are needed in the future.    Jon [name], BSN, RN-BC Embedded Chronic Care Manager [facility] Care Management Direct Dial: [phone]

## 2019-07-17 NOTE — Patient Instructions (Signed)
Visit Information  Goals Addressed            This Visit's Progress   . Palliative Care Services       Palliative care services and in home care needed in patient with Parkinson's and Lewy Body Dementia  Current Barriers:  [name] Knowledge Deficits related to palliative/supportive care services  Nurse Case Manager Clinical Goal(s):  [name] Over the next 7 days, patient will have in home assessment by palliative care services  Interventions:  . Talked with wife by phone . Chart reviewed . Discussed palliative care services . Discussed HH services and in home-aides . Questions answered . Encouraged wife to reach out to [facility] team as needed  Patient Self Care Activities:  . Wife assists or performs ADLs/IADLs due to parkinson's and dementia   Initial goal documentation      Demetrios Loll, BSN, RN-BC Embedded Chronic Care Manager [facility] Care Management Direct Dial: [phone]

## 2019-07-21 ENCOUNTER — Other Ambulatory Visit: Payer: Medicare Other

## 2019-07-21 ENCOUNTER — Other Ambulatory Visit: Payer: Self-pay | Admitting: *Deleted

## 2019-07-21 ENCOUNTER — Telehealth: Payer: Self-pay | Admitting: Family Medicine

## 2019-07-21 ENCOUNTER — Encounter: Payer: Self-pay | Admitting: Family Medicine

## 2019-07-21 DIAGNOSIS — R8290 Unspecified abnormal findings in urine: Secondary | ICD-10-CM

## 2019-07-21 DIAGNOSIS — R82998 Other abnormal findings in urine: Secondary | ICD-10-CM | POA: Diagnosis not present

## 2019-07-21 NOTE — Telephone Encounter (Signed)
Pt wife aware the urine culture takes 48 hours.

## 2019-07-22 ENCOUNTER — Other Ambulatory Visit: Payer: Self-pay

## 2019-07-22 ENCOUNTER — Other Ambulatory Visit: Payer: Self-pay | Admitting: Family Medicine

## 2019-07-22 ENCOUNTER — Ambulatory Visit (INDEPENDENT_AMBULATORY_CARE_PROVIDER_SITE_OTHER): Payer: Medicare Other

## 2019-07-22 ENCOUNTER — Telehealth: Payer: Self-pay | Admitting: Family Medicine

## 2019-07-22 DIAGNOSIS — G3183 Dementia with Lewy bodies: Secondary | ICD-10-CM | POA: Diagnosis not present

## 2019-07-22 DIAGNOSIS — I714 Abdominal aortic aneurysm, without rupture: Secondary | ICD-10-CM

## 2019-07-22 DIAGNOSIS — E785 Hyperlipidemia, unspecified: Secondary | ICD-10-CM

## 2019-07-22 DIAGNOSIS — D649 Anemia, unspecified: Secondary | ICD-10-CM

## 2019-07-22 DIAGNOSIS — Z993 Dependence on wheelchair: Secondary | ICD-10-CM

## 2019-07-22 DIAGNOSIS — Z9181 History of falling: Secondary | ICD-10-CM | POA: Diagnosis not present

## 2019-07-22 DIAGNOSIS — I10 Essential (primary) hypertension: Secondary | ICD-10-CM | POA: Diagnosis not present

## 2019-07-22 DIAGNOSIS — G309 Alzheimer's disease, unspecified: Secondary | ICD-10-CM

## 2019-07-22 DIAGNOSIS — F0281 Dementia in other diseases classified elsewhere with behavioral disturbance: Secondary | ICD-10-CM

## 2019-07-22 DIAGNOSIS — Z7984 Long term (current) use of oral hypoglycemic drugs: Secondary | ICD-10-CM | POA: Diagnosis not present

## 2019-07-22 DIAGNOSIS — E1165 Type 2 diabetes mellitus with hyperglycemia: Secondary | ICD-10-CM | POA: Diagnosis not present

## 2019-07-22 DIAGNOSIS — E559 Vitamin D deficiency, unspecified: Secondary | ICD-10-CM

## 2019-07-22 DIAGNOSIS — Z7982 Long term (current) use of aspirin: Secondary | ICD-10-CM | POA: Diagnosis not present

## 2019-07-22 DIAGNOSIS — Z87891 Personal history of nicotine dependence: Secondary | ICD-10-CM

## 2019-07-22 LAB — URINE CULTURE

## 2019-07-22 MED ORDER — SULFAMETHOXAZOLE-TRIMETHOPRIM 800-160 MG PO TABS: 1.0000 | ORAL_TABLET | Freq: Two times a day (BID) | ORAL | 0 refills | Status: DC

## 2019-07-22 NOTE — Telephone Encounter (Signed)
Spoke with pt's wife and she says she is concerned and he was c/o burning and itching and his urine is still really dark. She states she is trying to push fluids and keep him hydrated. Pt's wife states he doesn't have a fever but did have some trouble starting his stream last night. Advised pt's wife we are still waiting on the culture. She is concerned and wants to know if we can send in an antibiotic now instead of waiting?

## 2019-07-22 NOTE — Telephone Encounter (Signed)
Pt's wife aware.

## 2019-07-22 NOTE — Telephone Encounter (Signed)
I sent Bactrim to [facility]. WS

## 2019-07-22 NOTE — Telephone Encounter (Signed)
Patients wife wants nurse to call her as soon as patients lab results are in for his urine sample that was dropped off yesterday morning.

## 2019-07-23 ENCOUNTER — Ambulatory Visit (INDEPENDENT_AMBULATORY_CARE_PROVIDER_SITE_OTHER): Payer: Medicare Other | Admitting: Family Medicine

## 2019-07-23 ENCOUNTER — Encounter: Payer: Self-pay | Admitting: Family Medicine

## 2019-07-23 ENCOUNTER — Ambulatory Visit (INDEPENDENT_AMBULATORY_CARE_PROVIDER_SITE_OTHER): Payer: Medicare Other

## 2019-07-23 VITALS — BP 108/75 | HR 71 | Temp 97.8°F | Ht 70.0 in

## 2019-07-23 DIAGNOSIS — G3183 Dementia with Lewy bodies: Secondary | ICD-10-CM

## 2019-07-23 DIAGNOSIS — Z794 Long term (current) use of insulin: Secondary | ICD-10-CM

## 2019-07-23 DIAGNOSIS — M79601 Pain in right arm: Secondary | ICD-10-CM

## 2019-07-23 DIAGNOSIS — R310 Gross hematuria: Secondary | ICD-10-CM

## 2019-07-23 DIAGNOSIS — F0281 Dementia in other diseases classified elsewhere with behavioral disturbance: Secondary | ICD-10-CM

## 2019-07-23 DIAGNOSIS — E559 Vitamin D deficiency, unspecified: Secondary | ICD-10-CM

## 2019-07-23 DIAGNOSIS — E118 Type 2 diabetes mellitus with unspecified complications: Secondary | ICD-10-CM | POA: Diagnosis not present

## 2019-07-23 DIAGNOSIS — G20 Parkinson's disease: Secondary | ICD-10-CM

## 2019-07-23 DIAGNOSIS — IMO0002 Reserved for concepts with insufficient information to code with codable children: Secondary | ICD-10-CM

## 2019-07-23 DIAGNOSIS — E1165 Type 2 diabetes mellitus with hyperglycemia: Secondary | ICD-10-CM | POA: Diagnosis not present

## 2019-07-23 DIAGNOSIS — Z125 Encounter for screening for malignant neoplasm of prostate: Secondary | ICD-10-CM

## 2019-07-23 DIAGNOSIS — F02818 Dementia in other diseases classified elsewhere, unspecified severity, with other behavioral disturbance: Secondary | ICD-10-CM

## 2019-07-23 DIAGNOSIS — M19011 Primary osteoarthritis, right shoulder: Secondary | ICD-10-CM | POA: Diagnosis not present

## 2019-07-23 LAB — BAYER DCA HB A1C WAIVED: HB A1C (BAYER DCA - WAIVED): 6 % (ref <7.0)

## 2019-07-23 NOTE — Progress Notes (Signed)
Subjective:  Patient ID: Jon [name], male    DOB: [birth year]  Age: 74 y.o. MRN: [id number]  CC: Arm Pain (right )   HPI KEISUKE HOLLABAUGH presents for continued pain RUE at the forearm, just proximal to the wrist.Pain exacerbated by elbow motion and shoulder abduction. Grip is uncomfortable but not overtly painful.  Wife notes that urine was solid black for the last several days.. Looked like blood when it dried. Today it is normal yellow again. Culture done 2 days ago just had mixed flora.  Wife is keeping him on a careful diet and although it annoys him and he rebels against it in order to control his diabetes.  He is total care patient.  Unable to exercise.  He is weak and debilitated  Depression screen [facility] 2/9 02/25/2019 02/14/2019 08/23/2018  Decreased Interest 0 0 0  Down, Depressed, Hopeless 0 0 0  PHQ - 2 Score 0 0 0  Altered sleeping - - -  Tired, decreased energy - - -  Change in appetite - - -  Feeling bad or failure about yourself  - - -  Trouble concentrating - - -  Moving slowly or fidgety/restless - - -  Suicidal thoughts - - -  PHQ-9 Score - - -    History Vasilis has a past medical history of AAA (abdominal aortic aneurysm) ([place]), Alzheimer disease ([place]), Anemia, Ataxia, Diabetes mellitus without complication ([place]), History of gallstones, Hyperlipidemia, Hypertension, and Vitamin D deficiency.   He has a past surgical history that includes ERCP; Inguinal hernia repair; and Tonsillectomy.   His family history includes Diabetes in his brother and mother; Hip fracture in his mother; Hyperlipidemia in his brother; Hypertension in his brother; Lupus in his sister; Pulmonary embolism in his mother; Scleroderma in his sister.He reports that he quit smoking about 10 years ago. His smoking use included cigarettes. He started smoking about 53 years ago. He has a 40.00 pack-year smoking history. He has never used smokeless tobacco. He reports that he does not drink alcohol or use  drugs.    ROS Review of Systems  Constitutional: Negative for fever.  Respiratory: Negative for shortness of breath.   Cardiovascular: Negative for chest pain.  Genitourinary: Difficulty urinating: ptHe is essentially incontinent.  Musculoskeletal: Negative for arthralgias.  Skin: Negative for rash.  Psychiatric/Behavioral: Positive for confusion and decreased concentration.    Objective:  BP 108/75   Pulse 71   Temp 97.8 F (36.6 C) (Temporal)   Ht _0  (1.778 m)   SpO2 95%   BMI 25.62 kg/m   BP Readings from Last 3 Encounters:  07/23/19 108/75  02/25/19 (!) 86/68  02/05/19 102/75    Wt Readings from Last 3 Encounters:  08/12/18 178 lb 9.2 oz (81 kg)  05/03/18 178 lb 12.8 oz (81.1 kg)  04/08/18 195 lb (88.5 kg)     Physical Exam Constitutional:      General: He is not in acute distress.    Appearance: He is ill-appearing.     Comments: It is wheelchair-bound.  Debilitated and weak  Musculoskeletal:        General: Tenderness (At the right proximal wrist.  No lesion visible.  It is painful for [place] pronation for elbow flexion and shoulder abduction at the medial proximal wrist) present. No deformity.  Neurological:     General: No focal deficit present.     Mental Status: He is alert.     Motor: Weakness present.  Coordination: Coordination abnormal (Parkinsonian tremor at the wrist bilaterally).    Elbow wrist and shoulder x-rays performed show no significant abnormality.  Hemoglobin A1c equal 6.0   Assessment & Plan:   Elroy was seen today for arm pain.  Diagnoses and all orders for this visit:  Arm pain, diffuse, right -     DG Shoulder Right; Future -     DG Forearm Right; Future -     DG Wrist Complete Right; Future  Primary Parkinsonism ([place]) -     CMP14+EGFR  Uncontrolled type 2 diabetes mellitus with complication, with long-term current use of insulin (HCC) -     CBC with Differential/Platelet -     CMP14+EGFR -     Lipid  panel -     Bayer DCA Hb A1c Waived  Lewy body dementia with behavioral disturbance (HCC)  Vitamin D deficiency -     VITAMIN D 25 Hydroxy (Vit-D Deficiency, Fractures)  Screening for prostate cancer -     PSA Total (Reflex To Free)  [name] hematuria -     CBC with Differential/Platelet -     CMP14+EGFR -     Urinalysis, Complete -     Urine Culture       I have discontinued Nicole Kindred L. Suit's fluconazole. I am also having him maintain his aspirin, Acetaminophen (TYLENOL ARTHRITIS EXT RELIEF PO), citalopram, Sure Comfort Pen Needles, Vitamin D, glucose blood, blood glucose meter kit and supplies, FreeStyle Freedom Lite, megestrol, rivastigmine, rOPINIRole, tamsulosin, nystatin, metFORMIN, gabapentin, omeprazole, and sulfamethoxazole-trimethoprim.  Allergies as of 07/23/2019      Reactions   Donepezil Nausea And Vomiting   Namenda [memantine Hcl] Other (See Comments)   Sleeps all the time   Phenergan [promethazine Hcl] Other (See Comments)   Aggressive       Medication List       Accurate as of July 23, 2019  5:28 PM. If you have any questions, ask your nurse or doctor.        STOP taking these medications   fluconazole 150 MG tablet Commonly known as: DIFLUCAN Stopped by: Claretta Fraise, MD     TAKE these medications   aspirin 81 MG tablet Take 81 mg by mouth daily.   blood glucose meter kit and supplies Patient requests a Freestyle Meter. Use up to four times daily as directed. (FOR ICD-10 E10.9, E11.9).   citalopram 10 MG tablet Commonly known as: CELEXA Take 10 mg by mouth daily.   FreeStyle Freedom Lite w/Device Kit USE TO CHECK FASTING AND 2 HOURS AFTER BIGGEST MEAL Dx E11.8   gabapentin 300 MG capsule Commonly known as: NEURONTIN Take 1 capsule (300 mg total) by mouth 3 (three) times daily.   glucose blood test strip Commonly known as: FREESTYLE LITE USE TO CHECK FASTING AND 2 HOURS AFTER BIGGEST MEAL   megestrol 400 MG/10ML suspension Commonly  known as: MEGACE Take 10 mLs (400 mg total) by mouth 2 (two) times daily. For appetite stimulation   metFORMIN 1000 MG tablet Commonly known as: GLUCOPHAGE TAKE 1 TABLET DAILY WITH BREAKFAST   nystatin 100000 UNIT/ML suspension Commonly known as: MYCOSTATIN Take 5 mLs (500,000 Units total) by mouth 4 (four) times daily.   omeprazole 20 MG capsule Commonly known as: PRILOSEC TAKE 1 CAPSULE DAILY   rivastigmine 4.5 MG capsule Commonly known as: EXELON TAKE 1 CAPSULE TWICE A DAY   rOPINIRole 0.25 MG tablet Commonly known as: Requip Take 1 tablet (0.25 mg total) by mouth 3 (  three) times daily.   sulfamethoxazole-trimethoprim 800-160 MG tablet Commonly known as: BACTRIM DS Take 1 tablet by mouth 2 (two) times daily. Until gone, for infection   Sure Comfort Pen Needles 32G X 6 MM Misc Generic drug: Insulin Pen Needle USE AS DIRECTED   tamsulosin 0.4 MG Caps capsule Commonly known as: FLOMAX Take by mouth.   TYLENOL ARTHRITIS EXT RELIEF PO Take by mouth.   Vitamin D 50 MCG (2000 UT) Caps Take 1 capsule by mouth daily.      In spite of his debility his diabetes is under excellent control.  This is  because he cannot cook for himself and his wife is devoted to making sure he eats properly   Follow-up: Return in about 3 months (around 10/21/2019).  Claretta Fraise, M.D.

## 2019-07-24 ENCOUNTER — Telehealth: Payer: Self-pay | Admitting: Family Medicine

## 2019-07-24 DIAGNOSIS — I714 Abdominal aortic aneurysm, without rupture: Secondary | ICD-10-CM | POA: Diagnosis not present

## 2019-07-24 DIAGNOSIS — E1165 Type 2 diabetes mellitus with hyperglycemia: Secondary | ICD-10-CM | POA: Diagnosis not present

## 2019-07-24 DIAGNOSIS — F0281 Dementia in other diseases classified elsewhere with behavioral disturbance: Secondary | ICD-10-CM | POA: Diagnosis not present

## 2019-07-24 DIAGNOSIS — I10 Essential (primary) hypertension: Secondary | ICD-10-CM | POA: Diagnosis not present

## 2019-07-24 DIAGNOSIS — G3183 Dementia with Lewy bodies: Secondary | ICD-10-CM | POA: Diagnosis not present

## 2019-07-24 DIAGNOSIS — G309 Alzheimer's disease, unspecified: Secondary | ICD-10-CM | POA: Diagnosis not present

## 2019-07-24 LAB — CBC WITH DIFFERENTIAL/PLATELET
Basophils Absolute: 0.1 10*3/uL (ref 0.0–0.2)
Basos: 1 %
EOS (ABSOLUTE): 0.3 10*3/uL (ref 0.0–0.4)
Eos: 3 %
Hematocrit: 42.6 % (ref 37.5–51.0)
Hemoglobin: 14.8 g/dL (ref 13.0–17.7)
Immature Grans (Abs): 0 10*3/uL (ref 0.0–0.1)
Immature Granulocytes: 0 %
Lymphocytes Absolute: 3.3 10*3/uL — ABNORMAL HIGH (ref 0.7–3.1)
Lymphs: 31 %
MCH: 30.5 pg (ref 26.6–33.0)
MCHC: 34.7 g/dL (ref 31.5–35.7)
MCV: 88 fL (ref 79–97)
Monocytes Absolute: 1.1 10*3/uL — ABNORMAL HIGH (ref 0.1–0.9)
Monocytes: 10 %
Neutrophils Absolute: 5.8 10*3/uL (ref 1.4–7.0)
Neutrophils: 55 %
Platelets: 173 10*3/uL (ref 150–450)
RBC: 4.85 x10E6/uL (ref 4.14–5.80)
RDW: 12.3 % (ref 11.6–15.4)
WBC: 10.6 10*3/uL (ref 3.4–10.8)

## 2019-07-24 LAB — CMP14+EGFR
ALT: 46 IU/L — ABNORMAL HIGH (ref 0–44)
AST: 45 IU/L — ABNORMAL HIGH (ref 0–40)
Albumin/Globulin Ratio: 1.3 (ref 1.2–2.2)
Albumin: 4 g/dL (ref 3.7–4.7)
Alkaline Phosphatase: 384 IU/L — ABNORMAL HIGH (ref 39–117)
BUN/Creatinine Ratio: 17 (ref 10–24)
BUN: 11 mg/dL (ref 8–27)
Bilirubin Total: 0.5 mg/dL (ref 0.0–1.2)
CO2: 22 mmol/L (ref 20–29)
Calcium: 9.4 mg/dL (ref 8.6–10.2)
Chloride: 102 mmol/L (ref 96–106)
Creatinine, Ser: 0.64 mg/dL — ABNORMAL LOW (ref 0.76–1.27)
GFR calc Af Amer: 114 mL/min/{1.73_m2} (ref >59)
GFR calc non Af Amer: 99 mL/min/{1.73_m2} (ref >59)
Globulin, Total: 3.2 g/dL (ref 1.5–4.5)
Glucose: 93 mg/dL (ref 65–99)
Potassium: 3.8 mmol/L (ref 3.5–5.2)
Sodium: 138 mmol/L (ref 134–144)
Total Protein: 7.2 g/dL (ref 6.0–8.5)

## 2019-07-24 LAB — PSA TOTAL (REFLEX TO FREE): Prostate Specific Ag, Serum: 0.3 ng/mL (ref 0.0–4.0)

## 2019-07-24 LAB — VITAMIN D 25 HYDROXY (VIT D DEFICIENCY, FRACTURES): Vit D, 25-Hydroxy: 51.6 ng/mL (ref 30.0–100.0)

## 2019-07-24 LAB — LIPID PANEL
Chol/HDL Ratio: 2.7 ratio (ref 0.0–5.0)
Cholesterol, Total: 179 mg/dL (ref 100–199)
HDL: 67 mg/dL (ref >39)
LDL Chol Calc (NIH): 89 mg/dL (ref 0–99)
Triglycerides: 131 mg/dL (ref 0–149)
VLDL Cholesterol Cal: 23 mg/dL (ref 5–40)

## 2019-07-24 NOTE — Telephone Encounter (Signed)
Wife aware of results and aware we will call back once other labs have been received.

## 2019-07-24 NOTE — Telephone Encounter (Signed)
Patients wife called to speak with someone about patients lab results. Wants to be called back with results asap.

## 2019-07-28 ENCOUNTER — Telehealth: Payer: Self-pay | Admitting: Family Medicine

## 2019-07-28 LAB — SPECIMEN STATUS REPORT

## 2019-07-28 LAB — GAMMA GT: GGT: 457 IU/L — ABNORMAL HIGH (ref 0–65)

## 2019-07-28 LAB — NUCLEOTIDASE, 5', BLOOD: 5-Nucleotidase: 27 IU/L — ABNORMAL HIGH (ref 0–10)

## 2019-07-28 NOTE — Telephone Encounter (Signed)
Result discussed with wife.

## 2019-07-29 DIAGNOSIS — F0281 Dementia in other diseases classified elsewhere with behavioral disturbance: Secondary | ICD-10-CM | POA: Diagnosis not present

## 2019-07-29 DIAGNOSIS — E1165 Type 2 diabetes mellitus with hyperglycemia: Secondary | ICD-10-CM | POA: Diagnosis not present

## 2019-07-29 DIAGNOSIS — G309 Alzheimer's disease, unspecified: Secondary | ICD-10-CM | POA: Diagnosis not present

## 2019-07-29 DIAGNOSIS — I10 Essential (primary) hypertension: Secondary | ICD-10-CM | POA: Diagnosis not present

## 2019-07-29 DIAGNOSIS — G3183 Dementia with Lewy bodies: Secondary | ICD-10-CM | POA: Diagnosis not present

## 2019-07-29 DIAGNOSIS — I714 Abdominal aortic aneurysm, without rupture: Secondary | ICD-10-CM | POA: Diagnosis not present

## 2019-07-31 ENCOUNTER — Telehealth: Payer: Medicare Other

## 2019-07-31 DIAGNOSIS — I10 Essential (primary) hypertension: Secondary | ICD-10-CM | POA: Diagnosis not present

## 2019-07-31 DIAGNOSIS — G3183 Dementia with Lewy bodies: Secondary | ICD-10-CM | POA: Diagnosis not present

## 2019-07-31 DIAGNOSIS — E1165 Type 2 diabetes mellitus with hyperglycemia: Secondary | ICD-10-CM | POA: Diagnosis not present

## 2019-07-31 DIAGNOSIS — G309 Alzheimer's disease, unspecified: Secondary | ICD-10-CM | POA: Diagnosis not present

## 2019-07-31 DIAGNOSIS — I714 Abdominal aortic aneurysm, without rupture: Secondary | ICD-10-CM | POA: Diagnosis not present

## 2019-07-31 DIAGNOSIS — F0281 Dementia in other diseases classified elsewhere with behavioral disturbance: Secondary | ICD-10-CM | POA: Diagnosis not present

## 2019-08-04 ENCOUNTER — Encounter: Payer: Self-pay | Admitting: Family Medicine

## 2019-08-05 ENCOUNTER — Other Ambulatory Visit: Payer: Self-pay | Admitting: Family Medicine

## 2019-08-05 ENCOUNTER — Ambulatory Visit: Payer: Medicare Other | Admitting: Family Medicine

## 2019-08-05 DIAGNOSIS — E1165 Type 2 diabetes mellitus with hyperglycemia: Secondary | ICD-10-CM | POA: Diagnosis not present

## 2019-08-05 DIAGNOSIS — I10 Essential (primary) hypertension: Secondary | ICD-10-CM | POA: Diagnosis not present

## 2019-08-05 DIAGNOSIS — G3183 Dementia with Lewy bodies: Secondary | ICD-10-CM | POA: Diagnosis not present

## 2019-08-05 DIAGNOSIS — I714 Abdominal aortic aneurysm, without rupture: Secondary | ICD-10-CM | POA: Diagnosis not present

## 2019-08-05 DIAGNOSIS — F0281 Dementia in other diseases classified elsewhere with behavioral disturbance: Secondary | ICD-10-CM | POA: Diagnosis not present

## 2019-08-05 DIAGNOSIS — G309 Alzheimer's disease, unspecified: Secondary | ICD-10-CM | POA: Diagnosis not present

## 2019-08-05 MED ORDER — GABAPENTIN 100 MG PO CAPS: ORAL_CAPSULE | ORAL | 0 refills | Status: DC

## 2019-08-05 NOTE — Telephone Encounter (Signed)
I sent in a two week taper of Gabapentin to [facility] drug store. 100 mg cap. 2 qhs for a week. Then 1 qhs for one week, then DC.

## 2019-08-07 ENCOUNTER — Encounter: Payer: Self-pay | Admitting: Family Medicine

## 2019-08-07 DIAGNOSIS — G3183 Dementia with Lewy bodies: Secondary | ICD-10-CM | POA: Diagnosis not present

## 2019-08-07 DIAGNOSIS — G309 Alzheimer's disease, unspecified: Secondary | ICD-10-CM | POA: Diagnosis not present

## 2019-08-07 DIAGNOSIS — I714 Abdominal aortic aneurysm, without rupture: Secondary | ICD-10-CM | POA: Diagnosis not present

## 2019-08-07 DIAGNOSIS — F0281 Dementia in other diseases classified elsewhere with behavioral disturbance: Secondary | ICD-10-CM | POA: Diagnosis not present

## 2019-08-07 DIAGNOSIS — I10 Essential (primary) hypertension: Secondary | ICD-10-CM | POA: Diagnosis not present

## 2019-08-07 DIAGNOSIS — E1165 Type 2 diabetes mellitus with hyperglycemia: Secondary | ICD-10-CM | POA: Diagnosis not present

## 2019-08-08 ENCOUNTER — Telehealth: Payer: Medicare Other

## 2019-08-11 ENCOUNTER — Other Ambulatory Visit: Payer: Self-pay | Admitting: Family Medicine

## 2019-08-11 DIAGNOSIS — Z9181 History of falling: Secondary | ICD-10-CM | POA: Diagnosis not present

## 2019-08-11 DIAGNOSIS — Z7982 Long term (current) use of aspirin: Secondary | ICD-10-CM | POA: Diagnosis not present

## 2019-08-11 DIAGNOSIS — G3183 Dementia with Lewy bodies: Secondary | ICD-10-CM | POA: Diagnosis not present

## 2019-08-11 DIAGNOSIS — E1165 Type 2 diabetes mellitus with hyperglycemia: Secondary | ICD-10-CM | POA: Diagnosis not present

## 2019-08-11 DIAGNOSIS — Z7984 Long term (current) use of oral hypoglycemic drugs: Secondary | ICD-10-CM | POA: Diagnosis not present

## 2019-08-11 DIAGNOSIS — D649 Anemia, unspecified: Secondary | ICD-10-CM | POA: Diagnosis not present

## 2019-08-11 DIAGNOSIS — E785 Hyperlipidemia, unspecified: Secondary | ICD-10-CM | POA: Diagnosis not present

## 2019-08-11 DIAGNOSIS — I10 Essential (primary) hypertension: Secondary | ICD-10-CM | POA: Diagnosis not present

## 2019-08-11 DIAGNOSIS — I714 Abdominal aortic aneurysm, without rupture: Secondary | ICD-10-CM | POA: Diagnosis not present

## 2019-08-11 DIAGNOSIS — G309 Alzheimer's disease, unspecified: Secondary | ICD-10-CM | POA: Diagnosis not present

## 2019-08-11 DIAGNOSIS — F0281 Dementia in other diseases classified elsewhere with behavioral disturbance: Secondary | ICD-10-CM | POA: Diagnosis not present

## 2019-08-11 DIAGNOSIS — Z993 Dependence on wheelchair: Secondary | ICD-10-CM | POA: Diagnosis not present

## 2019-08-11 DIAGNOSIS — Z87891 Personal history of nicotine dependence: Secondary | ICD-10-CM | POA: Diagnosis not present

## 2019-08-11 DIAGNOSIS — E559 Vitamin D deficiency, unspecified: Secondary | ICD-10-CM | POA: Diagnosis not present

## 2019-08-12 DIAGNOSIS — I714 Abdominal aortic aneurysm, without rupture: Secondary | ICD-10-CM | POA: Diagnosis not present

## 2019-08-12 DIAGNOSIS — F0281 Dementia in other diseases classified elsewhere with behavioral disturbance: Secondary | ICD-10-CM | POA: Diagnosis not present

## 2019-08-12 DIAGNOSIS — E1165 Type 2 diabetes mellitus with hyperglycemia: Secondary | ICD-10-CM | POA: Diagnosis not present

## 2019-08-12 DIAGNOSIS — G309 Alzheimer's disease, unspecified: Secondary | ICD-10-CM | POA: Diagnosis not present

## 2019-08-12 DIAGNOSIS — I10 Essential (primary) hypertension: Secondary | ICD-10-CM | POA: Diagnosis not present

## 2019-08-12 DIAGNOSIS — G3183 Dementia with Lewy bodies: Secondary | ICD-10-CM | POA: Diagnosis not present

## 2019-08-13 DIAGNOSIS — R5381 Other malaise: Secondary | ICD-10-CM | POA: Diagnosis not present

## 2019-08-13 DIAGNOSIS — M79601 Pain in right arm: Secondary | ICD-10-CM | POA: Diagnosis not present

## 2019-08-13 DIAGNOSIS — G20 Parkinson's disease: Secondary | ICD-10-CM | POA: Diagnosis not present

## 2019-08-13 DIAGNOSIS — G3183 Dementia with Lewy bodies: Secondary | ICD-10-CM | POA: Diagnosis not present

## 2019-08-14 DIAGNOSIS — G3183 Dementia with Lewy bodies: Secondary | ICD-10-CM | POA: Diagnosis not present

## 2019-08-14 DIAGNOSIS — E1165 Type 2 diabetes mellitus with hyperglycemia: Secondary | ICD-10-CM | POA: Diagnosis not present

## 2019-08-14 DIAGNOSIS — F0281 Dementia in other diseases classified elsewhere with behavioral disturbance: Secondary | ICD-10-CM | POA: Diagnosis not present

## 2019-08-14 DIAGNOSIS — I10 Essential (primary) hypertension: Secondary | ICD-10-CM | POA: Diagnosis not present

## 2019-08-14 DIAGNOSIS — G309 Alzheimer's disease, unspecified: Secondary | ICD-10-CM | POA: Diagnosis not present

## 2019-08-14 DIAGNOSIS — I714 Abdominal aortic aneurysm, without rupture: Secondary | ICD-10-CM | POA: Diagnosis not present

## 2019-08-18 ENCOUNTER — Telehealth: Payer: Self-pay | Admitting: Family Medicine

## 2019-08-18 ENCOUNTER — Other Ambulatory Visit: Payer: Self-pay | Admitting: Family Medicine

## 2019-08-18 DIAGNOSIS — J439 Emphysema, unspecified: Secondary | ICD-10-CM

## 2019-08-18 NOTE — Telephone Encounter (Signed)
Spoke with Eber Jones from [employer] who called stating that she had spoken with pt's wife who is requesting that pt get an overnight pulse reader. Eber Jones wants to speak with Dr Darlyn Read or Nurse regarding this.

## 2019-08-18 NOTE — Telephone Encounter (Signed)
Returned Jon [name]'s call.  [facility]ing [facility] Please contact patient after hearing back from provider. Please advise

## 2019-08-18 NOTE — Telephone Encounter (Signed)
I put in the order. Thanks, WS

## 2019-08-18 NOTE — Telephone Encounter (Signed)
Patients wife aware

## 2019-08-18 NOTE — Telephone Encounter (Signed)
NP Lory called back. Said she received a missed call from [place]. Requested to be called back.

## 2019-08-18 NOTE — Telephone Encounter (Signed)
lmtcb

## 2019-08-19 DIAGNOSIS — F0281 Dementia in other diseases classified elsewhere with behavioral disturbance: Secondary | ICD-10-CM | POA: Diagnosis not present

## 2019-08-19 DIAGNOSIS — I10 Essential (primary) hypertension: Secondary | ICD-10-CM | POA: Diagnosis not present

## 2019-08-19 DIAGNOSIS — G309 Alzheimer's disease, unspecified: Secondary | ICD-10-CM | POA: Diagnosis not present

## 2019-08-19 DIAGNOSIS — G3183 Dementia with Lewy bodies: Secondary | ICD-10-CM | POA: Diagnosis not present

## 2019-08-19 DIAGNOSIS — E1165 Type 2 diabetes mellitus with hyperglycemia: Secondary | ICD-10-CM | POA: Diagnosis not present

## 2019-08-19 DIAGNOSIS — I714 Abdominal aortic aneurysm, without rupture: Secondary | ICD-10-CM | POA: Diagnosis not present

## 2019-08-25 DIAGNOSIS — G3183 Dementia with Lewy bodies: Secondary | ICD-10-CM | POA: Diagnosis not present

## 2019-08-25 DIAGNOSIS — F0281 Dementia in other diseases classified elsewhere with behavioral disturbance: Secondary | ICD-10-CM | POA: Diagnosis not present

## 2019-08-25 DIAGNOSIS — I10 Essential (primary) hypertension: Secondary | ICD-10-CM | POA: Diagnosis not present

## 2019-08-25 DIAGNOSIS — I714 Abdominal aortic aneurysm, without rupture: Secondary | ICD-10-CM | POA: Diagnosis not present

## 2019-08-25 DIAGNOSIS — E1165 Type 2 diabetes mellitus with hyperglycemia: Secondary | ICD-10-CM | POA: Diagnosis not present

## 2019-08-25 DIAGNOSIS — G309 Alzheimer's disease, unspecified: Secondary | ICD-10-CM | POA: Diagnosis not present

## 2019-08-27 ENCOUNTER — Telehealth: Payer: Self-pay | Admitting: Family Medicine

## 2019-08-27 NOTE — Telephone Encounter (Signed)
Order faxed to [facility]

## 2019-09-01 DIAGNOSIS — G309 Alzheimer's disease, unspecified: Secondary | ICD-10-CM | POA: Diagnosis not present

## 2019-09-01 DIAGNOSIS — I714 Abdominal aortic aneurysm, without rupture: Secondary | ICD-10-CM | POA: Diagnosis not present

## 2019-09-01 DIAGNOSIS — G3183 Dementia with Lewy bodies: Secondary | ICD-10-CM | POA: Diagnosis not present

## 2019-09-01 DIAGNOSIS — I10 Essential (primary) hypertension: Secondary | ICD-10-CM | POA: Diagnosis not present

## 2019-09-01 DIAGNOSIS — E1165 Type 2 diabetes mellitus with hyperglycemia: Secondary | ICD-10-CM | POA: Diagnosis not present

## 2019-09-01 DIAGNOSIS — F0281 Dementia in other diseases classified elsewhere with behavioral disturbance: Secondary | ICD-10-CM | POA: Diagnosis not present

## 2019-09-02 ENCOUNTER — Other Ambulatory Visit: Payer: Self-pay | Admitting: Family Medicine

## 2019-09-03 NOTE — Addendum Note (Signed)
Addended by: Mechele Claude on: 09/03/2019 12:45 PM   Modules accepted: Orders

## 2019-09-08 DIAGNOSIS — G3183 Dementia with Lewy bodies: Secondary | ICD-10-CM | POA: Diagnosis not present

## 2019-09-08 DIAGNOSIS — I714 Abdominal aortic aneurysm, without rupture: Secondary | ICD-10-CM | POA: Diagnosis not present

## 2019-09-08 DIAGNOSIS — G309 Alzheimer's disease, unspecified: Secondary | ICD-10-CM | POA: Diagnosis not present

## 2019-09-08 DIAGNOSIS — E1165 Type 2 diabetes mellitus with hyperglycemia: Secondary | ICD-10-CM | POA: Diagnosis not present

## 2019-09-08 DIAGNOSIS — F0281 Dementia in other diseases classified elsewhere with behavioral disturbance: Secondary | ICD-10-CM | POA: Diagnosis not present

## 2019-09-08 DIAGNOSIS — I10 Essential (primary) hypertension: Secondary | ICD-10-CM | POA: Diagnosis not present

## 2019-09-09 ENCOUNTER — Encounter: Payer: Self-pay | Admitting: Family Medicine

## 2019-09-09 ENCOUNTER — Other Ambulatory Visit: Payer: Self-pay | Admitting: Family Medicine

## 2019-09-09 DIAGNOSIS — J449 Chronic obstructive pulmonary disease, unspecified: Secondary | ICD-10-CM | POA: Diagnosis not present

## 2019-09-09 DIAGNOSIS — R0902 Hypoxemia: Secondary | ICD-10-CM | POA: Diagnosis not present

## 2019-10-02 ENCOUNTER — Telehealth: Payer: Self-pay | Admitting: Family Medicine

## 2019-10-06 NOTE — Telephone Encounter (Signed)
Patient's wife says pulse ox came from [facility].  She will do some research for the results and call our office back.

## 2019-10-06 NOTE — Telephone Encounter (Signed)
Can someone please locate the result? I could not find it in the chart.

## 2019-10-16 IMAGING — NM NM HEPATOBILIARY IMAGE, INC GB
1 series · 6 of 6 positions shown · non-contrast
Comparison: CT abdomen and pelvis 08/12/2018, ultrasound abdomen
08/12/2018

CLINICAL DATA: Abdominal pain with nausea and vomiting question
acute cholecystitis

EXAM:
NUCLEAR MEDICINE HEPATOBILIARY IMAGING
TECHNIQUE: Sequential images of the abdomen were obtained [DATE] minutes
following intravenous administration of radiopharmaceutical.
RADIOPHARMACEUTICALS:  5.5 mCi Bc-BBm  Choletec IV

[Series 1: biliary · 3.25mm/px · 6 of 58 frames shown]
[frame 5/58]
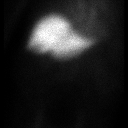
[frame 15/58]
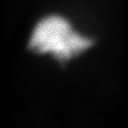
[frame 25/58]
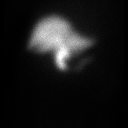
[frame 34/58]
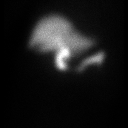
[frame 44/58]
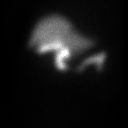
[frame 54/58]
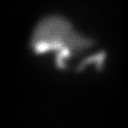

[6 of 6 positions shown; findings below may reference images not displayed]

FINDINGS: Adequate clearance of tracer from bloodstream.

Excretion of tracer into biliary tree with visualization of small
bowel by 27 minutes period gallbladder visualized at 42 minutes
period at 1 hour, moderate amount of retained tracer is seen within
the liver at suggesting a component of hepatocellular dysfunction.
IMPRESSION: Patent cystic duct.

Patent CBD.

Mild hepatocellular dysfunction.

## 2019-10-22 ENCOUNTER — Other Ambulatory Visit: Payer: Self-pay

## 2019-10-22 ENCOUNTER — Ambulatory Visit (INDEPENDENT_AMBULATORY_CARE_PROVIDER_SITE_OTHER): Payer: Medicare Other | Admitting: Family Medicine

## 2019-10-22 ENCOUNTER — Encounter: Payer: Self-pay | Admitting: Family Medicine

## 2019-10-22 DIAGNOSIS — R8290 Unspecified abnormal findings in urine: Secondary | ICD-10-CM | POA: Diagnosis not present

## 2019-10-22 DIAGNOSIS — N3090 Cystitis, unspecified without hematuria: Secondary | ICD-10-CM

## 2019-10-22 MED ORDER — SULFAMETHOXAZOLE-TRIMETHOPRIM 800-160 MG PO TABS: 1.0000 | ORAL_TABLET | Freq: Two times a day (BID) | ORAL | 0 refills | Status: DC

## 2019-10-22 NOTE — Progress Notes (Signed)
Subjective:    Patient ID: Jon [name], male    DOB: June 28, 1946, 74 y.o.   MRN: [id number]   HPI: Jon [name] is a 74 y.o. male presenting for strong smelling urine for two days. Worsening with time.. Not dark colored. No frequency. Denies dysuria.    Depression screen [facility] 2/9 02/25/2019 02/14/2019 08/23/2018 05/03/2018 03/12/2018  Decreased Interest 0 0 0 0 0  Down, Depressed, Hopeless 0 0 0 0 0  PHQ - 2 Score 0 0 0 0 0  Altered sleeping - - - - -  Tired, decreased energy - - - - -  Change in appetite - - - - -  Feeling bad or failure about yourself  - - - - -  Trouble concentrating - - - - -  Moving slowly or fidgety/restless - - - - -  Suicidal thoughts - - - - -  PHQ-9 Score - - - - -  Some recent data might be hidden     Relevant past medical, surgical, family and social history reviewed and updated as indicated.  Interim medical history since our last visit reviewed. Allergies and medications reviewed and updated.  ROS:  Review of Systems  Constitutional: Negative for fever.  HENT: Negative.   Respiratory: Negative for shortness of breath.   Gastrointestinal: Negative for nausea.  Genitourinary: Negative for difficulty urinating, dysuria, frequency and urgency.  Psychiatric/Behavioral: Negative for agitation and decreased concentration.     Social History   Tobacco Use  Smoking Status Former Smoker  . Packs/day: 1.00  . Years: 40.00  . Pack years: 40.00  . Types: Cigarettes  . Start date: 07/03/1966  . Quit date: 02/06/2009  . Years since quitting: 10.7  Smokeless Tobacco Never Used       Objective:     Wt Readings from Last 3 Encounters:  08/12/18 178 lb 9.2 oz (81 kg)  05/03/18 178 lb 12.8 oz (81.1 kg)  04/08/18 195 lb (88.5 kg)     Exam deferred. Pt. [place] due to COVID 19. Phone visit performed.   Assessment & Plan:   1. Cystitis     Meds ordered this encounter  Medications  . sulfamethoxazole-trimethoprim (BACTRIM DS) 800-160 MG  tablet    Sig: Take 1 tablet by mouth 2 (two) times daily. Until gone, for infection    Dispense:  20 tablet    Refill:  0    No orders of the defined types were placed in this encounter.     Diagnoses and all orders for this visit:  Cystitis  Other orders -     sulfamethoxazole-trimethoprim (BACTRIM DS) 800-160 MG tablet; Take 1 tablet by mouth 2 (two) times daily. Until gone, for infection   Patient's wife will drop off a urine specimen in the morning.  She says she has a sterile cup at home.  They were directed to hold off on the antibiotics until after the specimen was collected.  The exception to this would be if he develops a fever or nausea or seems to be confused more than baseline.   Virtual Visit via telephone Note  I discussed the limitations, risks, security and privacy concerns of performing an evaluation and management service by telephone and the availability of in person appointments. The patient was identified with two identifiers. Pt.expressed understanding and agreed to proceed. Pt. Is at home. Dr. Darlyn Read is in his office.  Follow Up Instructions:   I discussed the assessment and treatment plan with the  patient. The patient was provided an opportunity to ask questions and all were answered. The patient agreed with the plan and demonstrated an understanding of the instructions.   The patient was advised to call back or seek an in-person evaluation if the symptoms worsen or if the condition fails to improve as anticipated.   Total minutes including chart review and phone contact time: 11   Follow up plan: Return if symptoms worsen or fail to improve.  Claretta Fraise, MD [place]

## 2019-10-23 ENCOUNTER — Other Ambulatory Visit: Payer: Medicare Other

## 2019-10-23 ENCOUNTER — Other Ambulatory Visit: Payer: Self-pay

## 2019-10-23 ENCOUNTER — Telehealth: Payer: Self-pay | Admitting: Family Medicine

## 2019-10-23 DIAGNOSIS — N3090 Cystitis, unspecified without hematuria: Secondary | ICD-10-CM

## 2019-10-23 DIAGNOSIS — R8290 Unspecified abnormal findings in urine: Secondary | ICD-10-CM

## 2019-10-23 LAB — URINALYSIS, COMPLETE
Bilirubin, UA: NEGATIVE
Glucose, UA: NEGATIVE
Ketones, UA: NEGATIVE
Leukocytes,UA: NEGATIVE
Nitrite, UA: NEGATIVE
Protein,UA: NEGATIVE
Specific Gravity, UA: 1.02 (ref 1.005–1.030)
Urobilinogen, Ur: 2 mg/dL — ABNORMAL HIGH (ref 0.2–1.0)
pH, UA: 7 (ref 5.0–7.5)

## 2019-10-23 LAB — MICROSCOPIC EXAMINATION
Bacteria, UA: NONE SEEN
Epithelial Cells (non renal): NONE SEEN /hpf (ref 0–10)
RBC, Urine: >30 /hpf — AB (ref 0–2)
Renal Epithel, UA: NONE SEEN /hpf
WBC, UA: NONE SEEN /hpf (ref 0–5)

## 2019-10-23 NOTE — Addendum Note (Signed)
Addended by: Quay Burow on: 10/23/2019 01:46 PM   Modules accepted: Orders

## 2019-10-23 NOTE — Addendum Note (Signed)
Addended by: Quay Burow on: 10/23/2019 11:17 AM   Modules accepted: Orders

## 2019-10-23 NOTE — Addendum Note (Signed)
Addended by: Quay Burow on: 10/23/2019 11:41 AM   Modules accepted: Orders

## 2019-10-23 NOTE — Telephone Encounter (Signed)
Spoke with wife and informed that results are not in and that we will call when complete. Patient already has an Rx for Bactrim given by Dr. Darlyn Read at virtual appt on 10/22/19

## 2019-10-25 LAB — URINE CULTURE

## 2019-11-12 ENCOUNTER — Other Ambulatory Visit: Payer: Self-pay | Admitting: Family Medicine

## 2019-11-12 ENCOUNTER — Other Ambulatory Visit: Payer: Medicare Other

## 2019-11-12 ENCOUNTER — Telehealth: Payer: Self-pay | Admitting: Family Medicine

## 2019-11-12 DIAGNOSIS — N3090 Cystitis, unspecified without hematuria: Secondary | ICD-10-CM

## 2019-11-12 DIAGNOSIS — Z029 Encounter for administrative examinations, unspecified: Secondary | ICD-10-CM

## 2019-11-12 DIAGNOSIS — R8290 Unspecified abnormal findings in urine: Secondary | ICD-10-CM

## 2019-11-12 DIAGNOSIS — R300 Dysuria: Secondary | ICD-10-CM

## 2019-11-12 LAB — URINALYSIS, COMPLETE
Bilirubin, UA: NEGATIVE
Glucose, UA: NEGATIVE
Ketones, UA: NEGATIVE
Leukocytes,UA: NEGATIVE
Nitrite, UA: NEGATIVE
Protein,UA: NEGATIVE
RBC, UA: NEGATIVE
Specific Gravity, UA: 1.02 (ref 1.005–1.030)
Urobilinogen, Ur: 1 mg/dL (ref 0.2–1.0)
pH, UA: 6 (ref 5.0–7.5)

## 2019-11-12 LAB — MICROSCOPIC EXAMINATION
Bacteria, UA: NONE SEEN
Epithelial Cells (non renal): NONE SEEN /hpf (ref 0–10)
RBC, Urine: NONE SEEN /hpf (ref 0–2)
Renal Epithel, UA: NONE SEEN /hpf
WBC, UA: NONE SEEN /hpf (ref 0–5)

## 2019-11-12 NOTE — Telephone Encounter (Signed)
Patient wife is asking for an order to rck his urine from 10/22/2019. States he got better but now he has a strong odor. Is it okay put in order to rck his urine? Please advise if we can order?

## 2019-11-12 NOTE — Telephone Encounter (Signed)
Yes, thanks

## 2019-11-12 NOTE — Telephone Encounter (Signed)
Wife aware and complete UA and Culture ordered by Dr. Darlyn Read.

## 2019-11-13 LAB — URINE CULTURE

## 2019-11-17 NOTE — Progress Notes (Signed)
Hello  Mehtab,    Your lab result is normal and/or stable.Some minor variations that are not significant are commonly marked abnormal, but do not represent any medical problem for you.   Best regards,  Serenity Fortner, M.D.

## 2019-12-31 ENCOUNTER — Encounter: Payer: Self-pay | Admitting: Family Medicine

## 2020-01-01 MED ORDER — QUETIAPINE FUMARATE 25 MG PO TABS
25.0000 mg | ORAL_TABLET | Freq: Every day | ORAL | 1 refills | Status: DC
Start: 2020-01-01 — End: 2020-04-13

## 2020-01-19 ENCOUNTER — Telehealth: Payer: Self-pay | Admitting: Family Medicine

## 2020-01-19 NOTE — Telephone Encounter (Signed)
Wife aware of recommendations 

## 2020-01-19 NOTE — Telephone Encounter (Signed)
Wife states that patient is having lower abdominal swelling, normal BM, normal urination.  Swelling is only in the mornings

## 2020-01-19 NOTE — Telephone Encounter (Signed)
Wife states she would like to talk to the nurse. States husband is having regular BM and urinating. No pain. But she states his stomach is swelling.

## 2020-01-19 NOTE — Telephone Encounter (Signed)
Try using [employer]. If unsuccessful, then I would need to see him.

## 2020-01-26 ENCOUNTER — Telehealth: Payer: Self-pay | Admitting: Family Medicine

## 2020-01-26 DIAGNOSIS — G20 Parkinson's disease: Secondary | ICD-10-CM | POA: Diagnosis not present

## 2020-01-26 DIAGNOSIS — I10 Essential (primary) hypertension: Secondary | ICD-10-CM

## 2020-01-26 DIAGNOSIS — E1165 Type 2 diabetes mellitus with hyperglycemia: Secondary | ICD-10-CM

## 2020-01-26 DIAGNOSIS — M79601 Pain in right arm: Secondary | ICD-10-CM | POA: Diagnosis not present

## 2020-01-26 DIAGNOSIS — E782 Mixed hyperlipidemia: Secondary | ICD-10-CM

## 2020-01-26 DIAGNOSIS — G3183 Dementia with Lewy bodies: Secondary | ICD-10-CM | POA: Diagnosis not present

## 2020-01-26 DIAGNOSIS — IMO0002 Reserved for concepts with insufficient information to code with codable children: Secondary | ICD-10-CM

## 2020-01-26 DIAGNOSIS — R5381 Other malaise: Secondary | ICD-10-CM | POA: Diagnosis not present

## 2020-01-26 NOTE — Telephone Encounter (Signed)
Spoke with wife, she states that home health is coming in but it was ordered by the [place].  She wanted [place] to give them an order for the labs.  I explained to her that if we had ordered the home health we could do that but being like this she would need to bring him in for lab work.  She will plan to bring him in.  I placed orders for future labs.

## 2020-01-26 NOTE — Telephone Encounter (Signed)
CBC, CMP, A1c, lipid

## 2020-02-11 DIAGNOSIS — F0150 Vascular dementia without behavioral disturbance: Secondary | ICD-10-CM | POA: Diagnosis not present

## 2020-02-11 DIAGNOSIS — R419 Unspecified symptoms and signs involving cognitive functions and awareness: Secondary | ICD-10-CM | POA: Diagnosis not present

## 2020-02-13 ENCOUNTER — Encounter: Payer: Self-pay | Admitting: Family Medicine

## 2020-02-13 ENCOUNTER — Other Ambulatory Visit: Payer: Self-pay

## 2020-02-13 ENCOUNTER — Ambulatory Visit (INDEPENDENT_AMBULATORY_CARE_PROVIDER_SITE_OTHER): Payer: Medicare Other | Admitting: Family Medicine

## 2020-02-13 VITALS — BP 96/60 | HR 61 | Temp 98.3°F | Ht 70.0 in

## 2020-02-13 DIAGNOSIS — E559 Vitamin D deficiency, unspecified: Secondary | ICD-10-CM

## 2020-02-13 DIAGNOSIS — E119 Type 2 diabetes mellitus without complications: Secondary | ICD-10-CM

## 2020-02-13 DIAGNOSIS — E1165 Type 2 diabetes mellitus with hyperglycemia: Secondary | ICD-10-CM | POA: Diagnosis not present

## 2020-02-13 DIAGNOSIS — I10 Essential (primary) hypertension: Secondary | ICD-10-CM

## 2020-02-13 DIAGNOSIS — E782 Mixed hyperlipidemia: Secondary | ICD-10-CM | POA: Diagnosis not present

## 2020-02-13 DIAGNOSIS — E118 Type 2 diabetes mellitus with unspecified complications: Secondary | ICD-10-CM | POA: Diagnosis not present

## 2020-02-13 DIAGNOSIS — Z794 Long term (current) use of insulin: Secondary | ICD-10-CM

## 2020-02-13 LAB — BAYER DCA HB A1C WAIVED: HB A1C (BAYER DCA - WAIVED): 6.1 % (ref <7.0)

## 2020-02-13 NOTE — Progress Notes (Signed)
Subjective:  Patient ID: Jon [name], male    DOB: 02/23/1946  Age: 73 y.o. MRN: [id number]  CC: Follow-up   HPI Jon [name] presents for bilateral ear stuffiness, fullness with diminished hearing.  The along with his wife who is with him and gives much of the history, are concerned that there is a cerumen impaction.  Ms. Haymond tells me today that Jon [name] is spending most of his time in bed.  He rarely gets up at all.  He is a diabetic.  His blood sugars have been good as has his A1c recently.  The does not eat much but has not had any lows.  Depression [name] 2/9 02/13/2020 02/25/2019 02/14/2019  Decreased Interest 0 0 0  Down, Depressed, Hopeless 0 0 0  PHQ - 2 Score 0 0 0  Altered sleeping - - -  Tired, decreased energy - - -  Change in appetite - - -  Feeling bad or failure about yourself  - - -  Trouble concentrating - - -  Moving slowly or fidgety/restless - - -  Suicidal thoughts - - -  PHQ-9 Score - - -  Some recent data might be hidden    History Jon [name] has a past medical history of AAA (abdominal aortic aneurysm) ([place]), Alzheimer disease ([place]), Anemia, Ataxia, Diabetes mellitus without complication ([place]), History of gallstones, Hyperlipidemia, Hypertension, and Vitamin D deficiency.   He has a past surgical history that includes ERCP; Inguinal hernia repair; and Tonsillectomy.   His family history includes Diabetes in his brother and mother; Hip fracture in his mother; Hyperlipidemia in his brother; Hypertension in his brother; Lupus in his sister; Pulmonary embolism in his mother; Scleroderma in his sister.He reports that he quit smoking about 11 years ago. His smoking use included cigarettes. He started smoking about 53 years ago. He has a 40.00 pack-year smoking history. He has never used smokeless tobacco. He reports that he does not drink alcohol and does not use drugs.    ROS Review of Systems  Constitutional: Negative for activity change (Remains quite  minimal) and fever.  HENT: Positive for ear pain (More of a fullness than pain) and hearing loss. Negative for congestion.   Respiratory: Negative for shortness of breath.   Cardiovascular: Negative for chest pain.  Musculoskeletal: Negative for arthralgias.  Skin: Negative for rash.    Objective:  BP 96/60   Pulse 61   Temp 98.3 F (36.8 C) (Temporal)   Ht '5\' 10"'  (1.778 m)   BMI 25.62 kg/m   BP Readings from Last 3 Encounters:  02/13/20 96/60  07/23/19 108/75  02/25/19 (!) 86/68    Wt Readings from Last 3 Encounters:  08/12/18 178 lb 9.2 oz (81 kg)  05/03/18 178 lb 12.8 oz (81.1 kg)  04/08/18 195 lb (88.5 kg)     Physical Exam Vitals reviewed.  Constitutional:      Appearance: He is well-developed. He is ill-appearing.  HENT:     Head: Normocephalic and atraumatic.     Right Ear: External ear normal. There is impacted cerumen.     Left Ear: External ear normal. There is impacted cerumen.     Mouth/Throat:     Pharynx: No oropharyngeal exudate or posterior oropharyngeal erythema.  Eyes:     Pupils: Pupils are equal, round, and reactive to light.  Cardiovascular:     Rate and Rhythm: Normal rate and regular rhythm.     Heart sounds: No murmur heard.  Pulmonary:     Effort: No respiratory distress.     Breath sounds: Normal breath sounds.  Musculoskeletal:     Cervical back: Normal range of motion and neck supple.  Neurological:     Mental Status: He is alert and oriented to person, place, and time.    . Successfully removal of cerumen via lavage   Assessment & Plan:   Mikel was seen today for follow-up.  Diagnoses and all orders for this visit:  Vitamin D deficiency -     VITAMIN D 25 Hydroxy (Vit-D Deficiency, Fractures)  Controlled type 2 diabetes mellitus with insulin therapy ([place]) -     Lipid panel -     CMP14+EGFR -     CBC with Differential/Platelet -     Bayer DCA Hb A1c Waived  Mixed hyperlipidemia -     Lipid panel -      CMP14+EGFR -     CBC with Differential/Platelet  Essential hypertension -     Lipid panel -     CMP14+EGFR -     CBC with Differential/Platelet       I have discontinued Nicole Kindred L. Chicas's citalopram, megestrol, rOPINIRole, nystatin, gabapentin, and sulfamethoxazole-trimethoprim. I am also having him maintain his aspirin, Acetaminophen (TYLENOL ARTHRITIS EXT RELIEF PO), Sure Comfort Pen Needles, Vitamin D, glucose blood, blood glucose meter kit and supplies, FreeStyle Freedom Lite, tamsulosin, omeprazole, rivastigmine, metFORMIN, QUEtiapine, and escitalopram.  Allergies as of 02/13/2020      Reactions   Donepezil Nausea And Vomiting   Namenda [memantine Hcl] Other (See Comments)   Sleeps all the time   Phenergan [promethazine Hcl] Other (See Comments)   Aggressive       Medication List       Accurate as of February 13, 2020  7:21 PM. If you have any questions, ask your nurse or doctor.        STOP taking these medications   citalopram 10 MG tablet Commonly known as: CELEXA Stopped by: Claretta Fraise, MD   gabapentin 100 MG capsule Commonly known as: NEURONTIN Stopped by: Claretta Fraise, MD   megestrol 400 MG/10ML suspension Commonly known as: MEGACE Stopped by: Claretta Fraise, MD   nystatin 100000 UNIT/ML suspension Commonly known as: MYCOSTATIN Stopped by: Claretta Fraise, MD   rOPINIRole 0.25 MG tablet Commonly known as: Requip Stopped by: Claretta Fraise, MD   sulfamethoxazole-trimethoprim 800-160 MG tablet Commonly known as: BACTRIM DS Stopped by: Claretta Fraise, MD     TAKE these medications   aspirin 81 MG tablet Take 81 mg by mouth daily.   blood glucose meter kit and supplies Patient requests a Freestyle Meter. Use up to four times daily as directed. (FOR ICD-10 E10.9, E11.9).   escitalopram 10 MG tablet Commonly known as: LEXAPRO TAKE 1 TABLET DAILY)   FreeStyle Freedom Lite w/Device Kit USE TO CHECK FASTING AND 2 HOURS AFTER BIGGEST MEAL Dx E11.8    glucose blood test strip Commonly known as: FREESTYLE LITE USE TO CHECK FASTING AND 2 HOURS AFTER BIGGEST MEAL   metFORMIN 1000 MG tablet Commonly known as: GLUCOPHAGE TAKE 1 TABLET DAILY WITH BREAKFAST   omeprazole 20 MG capsule Commonly known as: PRILOSEC TAKE 1 CAPSULE DAILY   QUEtiapine 25 MG tablet Commonly known as: SEROQUEL Take 1 tablet (25 mg total) by mouth at bedtime.   rivastigmine 4.5 MG capsule Commonly known as: EXELON TAKE 1 CAPSULE TWICE A DAY   Sure Comfort Pen Needles 32G X 6 MM Misc Generic drug:  Insulin Pen Needle USE AS DIRECTED   tamsulosin 0.4 MG Caps capsule Commonly known as: FLOMAX Take by mouth.   TYLENOL ARTHRITIS EXT RELIEF PO Take by mouth.   Vitamin D 50 MCG (2000 UT) Caps Take 1 capsule by mouth daily.        Follow-up: Return in about 3 months (around 05/15/2020), or if symptoms worsen or fail to improve.  Claretta Fraise, M.D.

## 2020-02-14 LAB — CMP14+EGFR
ALT: 34 IU/L (ref 0–44)
AST: 39 IU/L (ref 0–40)
Albumin/Globulin Ratio: 1.1 — ABNORMAL LOW (ref 1.2–2.2)
Albumin: 4 g/dL (ref 3.7–4.7)
Alkaline Phosphatase: 263 IU/L — ABNORMAL HIGH (ref 48–121)
BUN/Creatinine Ratio: 23 (ref 10–24)
BUN: 20 mg/dL (ref 8–27)
Bilirubin Total: 0.4 mg/dL (ref 0.0–1.2)
CO2: 24 mmol/L (ref 20–29)
Calcium: 10.1 mg/dL (ref 8.6–10.2)
Chloride: 103 mmol/L (ref 96–106)
Creatinine, Ser: 0.88 mg/dL (ref 0.76–1.27)
GFR calc Af Amer: 99 mL/min/{1.73_m2} (ref >59)
GFR calc non Af Amer: 86 mL/min/{1.73_m2} (ref >59)
Globulin, Total: 3.8 g/dL (ref 1.5–4.5)
Glucose: 119 mg/dL — ABNORMAL HIGH (ref 65–99)
Potassium: 5.3 mmol/L — ABNORMAL HIGH (ref 3.5–5.2)
Sodium: 142 mmol/L (ref 134–144)
Total Protein: 7.8 g/dL (ref 6.0–8.5)

## 2020-02-14 LAB — CBC WITH DIFFERENTIAL/PLATELET
Basophils Absolute: 0.1 10*3/uL (ref 0.0–0.2)
Basos: 1 %
EOS (ABSOLUTE): 0.2 10*3/uL (ref 0.0–0.4)
Eos: 2 %
Hematocrit: 43.8 % (ref 37.5–51.0)
Hemoglobin: 15.1 g/dL (ref 13.0–17.7)
Immature Grans (Abs): 0.1 10*3/uL (ref 0.0–0.1)
Immature Granulocytes: 1 %
Lymphocytes Absolute: 2.5 10*3/uL (ref 0.7–3.1)
Lymphs: 26 %
MCH: 31.8 pg (ref 26.6–33.0)
MCHC: 34.5 g/dL (ref 31.5–35.7)
MCV: 92 fL (ref 79–97)
Monocytes Absolute: 0.9 10*3/uL (ref 0.1–0.9)
Monocytes: 9 %
Neutrophils Absolute: 6.1 10*3/uL (ref 1.4–7.0)
Neutrophils: 61 %
Platelets: 158 10*3/uL (ref 150–450)
RBC: 4.75 x10E6/uL (ref 4.14–5.80)
RDW: 13 % (ref 11.6–15.4)
WBC: 9.7 10*3/uL (ref 3.4–10.8)

## 2020-02-14 LAB — VITAMIN D 25 HYDROXY (VIT D DEFICIENCY, FRACTURES): Vit D, 25-Hydroxy: 51.5 ng/mL (ref 30.0–100.0)

## 2020-02-14 LAB — LIPID PANEL
Chol/HDL Ratio: 2.9 ratio (ref 0.0–5.0)
Cholesterol, Total: 196 mg/dL (ref 100–199)
HDL: 67 mg/dL (ref >39)
LDL Chol Calc (NIH): 109 mg/dL — ABNORMAL HIGH (ref 0–99)
Triglycerides: 113 mg/dL (ref 0–149)
VLDL Cholesterol Cal: 20 mg/dL (ref 5–40)

## 2020-02-15 NOTE — Progress Notes (Signed)
Hello  Kayler,    Your lab result is normal and/or stable.Some minor variations that are not significant are commonly marked abnormal, but do not represent any medical problem for you.   Best regards,  Lillar Bianca, M.D.

## 2020-02-18 ENCOUNTER — Ambulatory Visit (INDEPENDENT_AMBULATORY_CARE_PROVIDER_SITE_OTHER): Payer: Medicare Other

## 2020-02-18 DIAGNOSIS — Z0000 Encounter for general adult medical examination without abnormal findings: Secondary | ICD-10-CM | POA: Diagnosis not present

## 2020-02-18 NOTE — Patient Instructions (Signed)
°  Jon [name] , Thank you for taking time to come for your Medicare Wellness Visit. I appreciate your ongoing commitment to your health goals. Please review the following plan we discussed and let me know if I can assist you in the future.   These are the goals we discussed: Goals     DIET - INCREASE WATER INTAKE     Try to drink 6-8 glasses of water daily.     Palliative Care Services     Palliative care services and in home care needed in patient with Parkinson's and Lewy Body Dementia  Current Barriers:   Knowledge Deficits related to palliative/supportive care services  Nurse Case Manager Clinical Goal(s):   Over the next 7 days, patient will have in home assessment by palliative care services  Interventions:   Talked with wife by phone  Chart reviewed  Discussed palliative care services  Discussed [facility] services and in home-aides  Questions answered  Encouraged wife to reach out to CCM team as needed  Patient Self Care Activities:   Wife assists or performs ADLs/IADLs due to parkinson's and dementia   Initial goal documentation        This is a list of the screening recommended for you and due dates:  Health Maintenance  Topic Date Due   Tetanus Vaccine  Never done   Colon Cancer Screening  Never done   Pneumonia vaccines (1 of 2 - PCV13) Never done   Eye exam for diabetics  01/15/2016   Complete foot exam   09/08/2018   Urine Protein Check  12/25/2018   COVID-19 Vaccine (1) 03/05/2020*   Hemoglobin A1C  08/15/2020    Hepatitis C: One time screening is recommended by Center for Disease Control  (CDC) for  adults born from [birth year] through 1963.   Completed   Flu Shot  Discontinued  *Topic was postponed. The date shown is not the original due date.

## 2020-02-18 NOTE — Progress Notes (Signed)
MEDICARE ANNUAL WELLNESS VISIT  02/18/2020  Telephone Visit Disclaimer This Medicare AWV was conducted by telephone due to national recommendations for restrictions regarding the COVID-19 Pandemic (e.g. social distancing).  I verified, using two identifiers, that I am speaking with Jon [name] or their authorized healthcare agent. I discussed the limitations, risks, security, and privacy concerns of performing an evaluation and management service by telephone and the potential availability of an in-person appointment in the future. The patient expressed understanding and agreed to proceed.   Subjective:  Jon [name] is a 74 y.o. male patient of Stacks, Cletus Gash, MD who had a Medicare Annual Wellness Visit today via telephone. [place] was located at his home and I was located here in the office at [facility]. [place] lives here in [place] with his wife and her youngest daughter. [place] has 1 son and his wife has 3 daughters and 1 son. He is completely bedridden and dependent on his wife and daughter for everything. 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 According to his wife they are not sure what his actual diagnosis is. Some say dementia and others say parkinson's. Wife states that the [place] helps them out a lot. They have provided equipment needed and also a rotating bed to prevent bed sores.   Patient Care Team: Claretta Fraise, MD as PCP - General (Family Medicine) Arma Heading, MD as Consulting Physician (Neurology) Ernest Haber as Consulting Physician (Neurology)  Advanced Directives 02/18/2020 02/14/2019 08/13/2018 08/12/2018 01/01/2018 04/29/2015 02/06/2014  Does Patient Have a Medical Advance Directive? Yes Yes Yes Yes Yes Yes Patient would like information  Type of Advance Directive [place];Living will Living will;Healthcare Power of Attorney Living will [place];Living will - [place];Living will -  Does patient want to make changes to medical advance directive? No - Patient declined No - Patient declined No - Patient declined - - No - Patient declined -  Copy of [place] in Chart? No - copy requested No - copy requested - - - No - copy requested -  Would patient like information on creating a medical advance directive? - - - - - - Advance directive packet given    Hospital Utilization Over the Past 12 Months: # of hospitalizations or ER visits: 0 # of surgeries: 0  Review of Systems    Patient reports that his overall health is unchanged compared to last year.  History obtained from chart review  Patient Reported Readings (BP, Pulse, CBG, Weight, etc) none  Pain Assessment Pain : No/denies pain     Current Medications & Allergies (verified) Allergies as of 02/18/2020      Reactions   Donepezil Nausea And Vomiting   Namenda [memantine Hcl] Other (See Comments)   Sleeps all the time   Phenergan [promethazine Hcl] Other (See Comments)   Aggressive       Medication List       Accurate as of February 18, 2020  2:12 PM. If you have any questions, ask your nurse or doctor.        aspirin 81 MG tablet Take 81 mg by mouth daily.   blood glucose meter kit and supplies Patient requests a Freestyle Meter. Use up to four times daily as directed. (FOR ICD-10 E10.9, E11.9).   escitalopram 10 MG tablet Commonly known as: LEXAPRO TAKE  1 TABLET DAILY)   FreeStyle Freedom Lite w/Device Kit USE TO CHECK FASTING AND 2 HOURS AFTER BIGGEST MEAL Dx E11.8   glucose blood test strip Commonly known as: FREESTYLE LITE USE TO CHECK FASTING AND 2 HOURS AFTER BIGGEST MEAL   metFORMIN 1000 MG tablet Commonly known as: GLUCOPHAGE TAKE 1 TABLET DAILY WITH BREAKFAST   omeprazole 20 MG capsule Commonly known as: PRILOSEC TAKE 1 CAPSULE DAILY   QUEtiapine 25 MG tablet Commonly known as:  SEROQUEL Take 1 tablet (25 mg total) by mouth at bedtime.   rivastigmine 4.5 MG capsule Commonly known as: EXELON TAKE 1 CAPSULE TWICE A DAY   Sure Comfort Pen Needles 32G X 6 MM Misc Generic drug: Insulin Pen Needle USE AS DIRECTED   tamsulosin 0.4 MG Caps capsule Commonly known as: FLOMAX Take by mouth.   TYLENOL ARTHRITIS EXT RELIEF PO Take by mouth.   Vitamin D 50 MCG (2000 UT) Caps Take 1 capsule by mouth daily.       History (reviewed): Past Medical History:  Diagnosis Date  . AAA (abdominal aortic aneurysm) ([place])   . Alzheimer disease ([place])   . Anemia   . Ataxia   . Diabetes mellitus without complication ([place])   . History of gallstones   . Hyperlipidemia   . Hypertension   . Vitamin D deficiency    Past Surgical History:  Procedure Laterality Date  . ERCP    . INGUINAL HERNIA REPAIR    . TONSILLECTOMY     Family History  Problem Relation Age of Onset  . Diabetes Mother   . Hip fracture Mother   . Pulmonary embolism Mother        after hip fracture  . Lupus Sister   . Scleroderma Sister   . Diabetes Brother   . Hyperlipidemia Brother   . Hypertension Brother    Social History   Socioeconomic History  . Marital status: Married    Spouse name: [name]  . Number of children: 1  . Years of education: 12  . Highest education level: High school graduate  Occupational History  . Occupation: retired  Tobacco Use  . Smoking status: Former Smoker    Packs/day: 1.00    Years: 40.00    Pack years: 40.00    Types: Cigarettes    Start date: 07/03/1966    Quit date: 02/06/2009    Years since quitting: 11.0  . Smokeless tobacco: Never Used  Vaping Use  . Vaping Use: Never used  Substance and Sexual Activity  . Alcohol use: No    Alcohol/week: 0.0 standard drinks  . Drug use: No  . Sexual activity: Not Currently  Other Topics Concern  . Not on file  Social History Narrative  . Not on file   Social Determinants of Health   Financial Resource  Strain:   . Difficulty of Paying Living Expenses:   Food Insecurity:   . Worried About Charity fundraiser in the Last Year:   . Arboriculturist in the Last Year:   Transportation Needs:   . Film/video editor (Medical):   [name] Lack of Transportation (Non-Medical):   Physical Activity:   . Days of Exercise per Week:   . Minutes of Exercise per Session:   Stress:   . Feeling of Stress :   Social Connections:   . Frequency of Communication with Friends and Family:   . Frequency of Social Gatherings with Friends and Family:   . Attends  Religious Services:   . Active Member of Clubs or Organizations:   . Attends Archivist Meetings:   [name] Marital Status:     Activities of Daily Living In your present state of health, do you have any difficulty performing the following activities: 02/18/2020  Hearing? N  Vision? Y  Comment Wears reading glasses  Difficulty concentrating or making decisions? Y  Walking or climbing stairs? Y  Comment Pt is bedridden  Dressing or bathing? Y  Comment Dependant  Doing errands, shopping? Y  Comment Dependant  Preparing Food and eating ? Y  Using the Toilet? Y  In the past six months, have you accidently leaked urine? Y  Do you have problems with loss of bowel control? Y  Managing your Medications? Y  Managing your Finances? Y  Housekeeping or managing your Housekeeping? Y  Some recent data might be hidden    Patient Education/ Literacy How often do you need to have someone help you when you read instructions, pamphlets, or other written materials from your doctor or pharmacy?: 5 - Always What is the last grade level you completed in school?: 12th grade  Exercise Current Exercise Habits: The patient does not participate in regular exercise at present, Exercise limited by: neurologic condition(s)  Diet Patient reports consuming 3 meals a day and 2 snack(s) a day Patient reports that his primary diet is: Regular Patient reports that  she does have regular access to food.   Depression Screen PHQ 2/9 Scores 02/13/2020 02/25/2019 02/14/2019 08/23/2018 05/03/2018 03/12/2018 02/06/2018  PHQ - 2 Score 0 0 0 0 0 0 0  PHQ- 9 Score - - - - - - -     Fall Risk Fall Risk  02/18/2020 02/13/2020 02/25/2019 02/14/2019 08/23/2018  Falls in the past year? 0 0 0 Exclusion - non ambulatory 0  Number falls in past yr: - 0 - - -  Injury with Fall? - 0 - - -  Risk Factor Category  - - - - -  Risk for fall due to : - Impaired mobility - - -  Follow up - Falls evaluation completed - - -     Objective:  Jon [name] seemed alert and oriented and he participated appropriately during our telephone visit.  Blood Pressure Weight BMI  BP Readings from Last 3 Encounters:  02/13/20 96/60  07/23/19 108/75  02/25/19 (!) 86/68   Wt Readings from Last 3 Encounters:  08/12/18 178 lb 9.2 oz (81 kg)  05/03/18 178 lb 12.8 oz (81.1 kg)  04/08/18 195 lb (88.5 kg)   BMI Readings from Last 1 Encounters:  02/13/20 25.62 kg/m    *Unable to obtain current vital signs, weight, and BMI due to telephone visit type  Hearing/Vision  . [place] did not seem to have difficulty with hearing/understanding during the telephone conversation . Reports that he has not had a formal eye exam by an eye care professional within the past year . Reports that he has not had a formal hearing evaluation within the past year *Unable to fully assess hearing and vision during telephone visit type  Cognitive Function: No flowsheet data found. (Normal:0-7, Significant for Dysfunction: >8)  Normal Cognitive Function Screening: No: He was unable to complete due to cognitive impairment that comes and goes   Immunization & Health Maintenance Record  There is no immunization history on file for this patient.  Health Maintenance  Topic Date Due  . TETANUS/TDAP  Never done  . COLONOSCOPY  Never  done  . PNA vac Low Risk Adult (1 of 2 - PCV13) Never done  . OPHTHALMOLOGY EXAM   01/15/2016  . FOOT EXAM  09/08/2018  . URINE MICROALBUMIN  12/25/2018  . COVID-19 Vaccine (1) 03/05/2020 (Originally 12/20/1957)  . HEMOGLOBIN A1C  08/15/2020  . Hepatitis C Screening  Completed  . INFLUENZA VACCINE  Discontinued       Assessment  This is a routine wellness examination for CEM KOSMAN.  Health Maintenance: Due or Overdue Health Maintenance Due  Topic Date Due  . TETANUS/TDAP  Never done  . COLONOSCOPY  Never done  . PNA vac Low Risk Adult (1 of 2 - PCV13) Never done  . OPHTHALMOLOGY EXAM  01/15/2016  . FOOT EXAM  09/08/2018  . URINE MICROALBUMIN  12/25/2018    Jon [name] does not need a referral for Community Assistance: Care Management:   no Social Work:    no Prescription Assistance:  no Nutrition/Diabetes Education:  no   Plan:  Personalized Goals Goals Addressed   None    Personalized Health Maintenance & Screening Recommendations  Patient has several health maintenance items but is unable to come to office per wife  Lung Cancer Screening Recommended: no (Low Dose CT Chest recommended if Age 92-80 years, 30 pack-year currently smoking OR have quit w/in past 15 years) Hepatitis C Screening recommended: no HIV Screening recommended: no  Advanced Directives: Written information was not prepared per patient's request.  Referrals & Orders No orders of the defined types were placed in this encounter.   Follow-up Plan . Follow-up with Claretta Fraise, MD as planned  I have personally reviewed and noted the following in the patient's chart:   . Medical and social history . Use of alcohol, tobacco or illicit drugs  . Current medications and supplements . Functional ability and status . Nutritional status . Physical activity . Advanced directives . List of other physicians . Hospitalizations, surgeries, and ER visits in previous 12 months . Vitals . Screenings to include cognitive, depression, and falls . Referrals and appointments  In  addition, I have reviewed and discussed with Jon [name] certain preventive protocols, quality metrics, and best practice recommendations. A written personalized care plan for preventive services as well as general preventive health recommendations is available and can be mailed to the patient at his request.      Rolena Infante LPN [id number]

## 2020-03-07 ENCOUNTER — Other Ambulatory Visit: Payer: Self-pay | Admitting: Family Medicine

## 2020-03-25 ENCOUNTER — Telehealth: Payer: Self-pay | Admitting: Family Medicine

## 2020-03-25 NOTE — Telephone Encounter (Signed)
Pts wife called to let Dr Darlyn Read know that pt sat up today by himself for the first time in 2 years. Would like for Dr Darlyn Read to put in a referral for pt to have PT one more time.

## 2020-03-26 NOTE — Telephone Encounter (Signed)
Does he have an open home health referral? If yes, call it in to them. If no, he will need face to face for me to order Home Health for PT.

## 2020-03-29 ENCOUNTER — Telehealth: Payer: Self-pay | Admitting: Family Medicine

## 2020-03-29 NOTE — Telephone Encounter (Signed)
F2F video visit scheduled for 04/13/20

## 2020-04-02 DIAGNOSIS — R5381 Other malaise: Secondary | ICD-10-CM | POA: Diagnosis not present

## 2020-04-02 DIAGNOSIS — M79601 Pain in right arm: Secondary | ICD-10-CM | POA: Diagnosis not present

## 2020-04-02 DIAGNOSIS — G3183 Dementia with Lewy bodies: Secondary | ICD-10-CM | POA: Diagnosis not present

## 2020-04-02 DIAGNOSIS — G20 Parkinson's disease: Secondary | ICD-10-CM | POA: Diagnosis not present

## 2020-04-13 ENCOUNTER — Telehealth (INDEPENDENT_AMBULATORY_CARE_PROVIDER_SITE_OTHER): Payer: Medicare Other | Admitting: Family Medicine

## 2020-04-13 ENCOUNTER — Encounter: Payer: Self-pay | Admitting: Family Medicine

## 2020-04-13 DIAGNOSIS — G20 Parkinson's disease: Secondary | ICD-10-CM

## 2020-04-13 DIAGNOSIS — F0281 Dementia in other diseases classified elsewhere with behavioral disturbance: Secondary | ICD-10-CM | POA: Diagnosis not present

## 2020-04-13 DIAGNOSIS — R5381 Other malaise: Secondary | ICD-10-CM

## 2020-04-13 DIAGNOSIS — G3183 Dementia with Lewy bodies: Secondary | ICD-10-CM | POA: Diagnosis not present

## 2020-04-13 NOTE — Progress Notes (Signed)
Subjective:  Patient ID: Jon [name], male    DOB: 1945-10-10  Age: 74 y.o. MRN: [id number]  CC: No chief complaint on file.   HPI Jon [name] presents for desire for physical therapy rehabilitation.  Jon [name] has been bedridden for his Parkinson's disease combined with Lewy body dementia and has not been able to sit up well for some time.  However the last week without anticipation Jon [name] sat up on his own and sat on the side of his bed twice on 2 consecutive days.  Jon [name] was able to go out to breakfast with his caregiver, his wife.  Jon [name] was able to lean forward to eat his breakfast without falling backward or forward this was just 2 days ago.  She would like to see if it is possible to help him gain enough strength based on this event so that perhaps Jon [name] can sit up or make his own transfers without her having to use the [place] lift.  Today Jon [name] has no complaints himself.  Jon [name] is affable and answers questions briefly but appropriately.  Depression screen [facility] 2/9 02/13/2020 02/25/2019 02/14/2019  Decreased Interest 0 0 0  Down, Depressed, Hopeless 0 0 0  PHQ - 2 Score 0 0 0  Altered sleeping - - -  Tired, decreased energy - - -  Change in appetite - - -  Feeling bad or failure about yourself  - - -  Trouble concentrating - - -  Moving slowly or fidgety/restless - - -  Suicidal thoughts - - -  PHQ-9 Score - - -  Some recent data might be hidden    History Jon [name] has a past medical history of AAA (abdominal aortic aneurysm) ([place]), Alzheimer disease ([place]), Anemia, Ataxia, Diabetes mellitus without complication ([place]), History of gallstones, Hyperlipidemia, Hypertension, and Vitamin D deficiency.   Jon [name] has a past surgical history that includes ERCP; Inguinal hernia repair; and Tonsillectomy.   His family history includes Diabetes in his brother and mother; Hip fracture in his mother; Hyperlipidemia in his brother; Hypertension in his brother; Lupus in his sister; Pulmonary embolism in his mother;  Scleroderma in his sister.Jon [name] reports that Jon [name] quit smoking about 11 years ago. His smoking use included cigarettes. Jon [name] started smoking about 53 years ago. Jon [name] has a 40.00 pack-year smoking history. Jon [name] has never used smokeless tobacco. Jon [name] reports that Jon [name] does not drink alcohol and does not use drugs.    ROS Review of Systems  Unable to perform ROS: Dementia    Objective:  There were no vitals taken for this visit.  BP Readings from Last 3 Encounters:  02/13/20 96/60  07/23/19 108/75  02/25/19 (!) 86/68    Wt Readings from Last 3 Encounters:  08/12/18 178 lb 9.2 oz (81 kg)  05/03/18 178 lb 12.8 oz (81.1 kg)  04/08/18 195 lb (88.5 kg)     Physical Exam  Minimal exam was performed based on the video contact.  Jon [name] was seen to be laying in his bed propped up by IV elevation of the head of his bed.  I did observe him leaning forward on his own twice.  Jon [name] was able to turn towards the video camera and speak appropriately in short sentences.  Jon [name] is able to move his hands and arms appropriately.  Assessment & Plan:   Diagnoses and all orders for this visit:  Physical debility -     Ambulatory referral to [facility]  Lewy body dementia with behavioral disturbance ([place]) -  Ambulatory referral to [place]  Primary parkinsonism [facility]) -     Ambulatory referral to Home Health    Jon [name] appears to be a good candidate for rehab that could be palliative and that it would help him to be less bedridden and less likely to develop decubiti.  His wife is very active in his care.  She uses a [employer] fast streamer but that is very difficult for her.  Should Jon [name] be able to make assisted transfers it would have a very positive impact on both his and her lives.   I have discontinued Musab L. Fano's QUEtiapine. I am also having him maintain his aspirin, Acetaminophen (TYLENOL ARTHRITIS EXT RELIEF PO), Sure Comfort Pen Needles, Vitamin D, glucose blood, blood glucose meter kit and supplies, FreeStyle  Freedom Lite, tamsulosin, omeprazole, rivastigmine, escitalopram, and metFORMIN.  Allergies as of 04/13/2020      Reactions   Donepezil Nausea And Vomiting   Namenda [memantine Hcl] Other (See Comments)   Sleeps all the time   Phenergan [promethazine Hcl] Other (See Comments)   Aggressive       Medication List       Accurate as of April 13, 2020  7:45 PM. If you have any questions, ask your nurse or doctor.        STOP taking these medications   QUEtiapine 25 MG tablet Commonly known as: SEROQUEL Stopped by: Claretta Fraise, MD     TAKE these medications   aspirin 81 MG tablet Take 81 mg by mouth daily.   blood glucose meter kit and supplies Patient requests a Freestyle Meter. Use up to four times daily as directed. (FOR ICD-10 E10.9, E11.9).   escitalopram 10 MG tablet Commonly known as: LEXAPRO TAKE 1 TABLET DAILY)   FreeStyle Freedom Lite w/Device Kit USE TO CHECK FASTING AND 2 HOURS AFTER BIGGEST MEAL Dx E11.8   glucose blood test strip Commonly known as: FREESTYLE LITE USE TO CHECK FASTING AND 2 HOURS AFTER BIGGEST MEAL   metFORMIN 1000 MG tablet Commonly known as: GLUCOPHAGE TAKE 1 TABLET DAILY WITH BREAKFAST   omeprazole 20 MG capsule Commonly known as: PRILOSEC TAKE 1 CAPSULE DAILY   rivastigmine 4.5 MG capsule Commonly known as: EXELON TAKE 1 CAPSULE TWICE A DAY   Sure Comfort Pen Needles 32G X 6 MM Misc Generic drug: Insulin Pen Needle USE AS DIRECTED   tamsulosin 0.4 MG Caps capsule Commonly known as: FLOMAX Take by mouth.   TYLENOL ARTHRITIS EXT RELIEF PO Take by mouth.   Vitamin D 50 MCG (2000 UT) Caps Take 1 capsule by mouth daily.      Virtual Visit via E video  I discussed the limitations, risks, security and privacy concerns of performing an evaluation and management service by telephone and the availability of in person appointments. I also discussed with the patient that there may be a patient responsible charge related to  this service. The patient expressed understanding and agreed to proceed. Pt. Is at home. Dr. Livia Snellen is in his office.  Follow Up Instructions:   I discussed the assessment and treatment plan with the patient. The patient was provided an opportunity to ask questions and all were answered. The patient agreed with the plan and demonstrated an understanding of the instructions.   The patient was advised to call back or seek an in-person evaluation if the symptoms worsen or if the condition fails to improve as anticipated.  Total minutes including chart review, documentation and phone contact time: 19  Follow-up: No follow-ups on file.  Claretta Fraise, M.D.

## 2020-04-15 DIAGNOSIS — G3183 Dementia with Lewy bodies: Secondary | ICD-10-CM | POA: Diagnosis not present

## 2020-04-15 DIAGNOSIS — E119 Type 2 diabetes mellitus without complications: Secondary | ICD-10-CM | POA: Diagnosis not present

## 2020-04-15 DIAGNOSIS — E785 Hyperlipidemia, unspecified: Secondary | ICD-10-CM | POA: Diagnosis not present

## 2020-04-15 DIAGNOSIS — G309 Alzheimer's disease, unspecified: Secondary | ICD-10-CM | POA: Diagnosis not present

## 2020-04-15 DIAGNOSIS — Z993 Dependence on wheelchair: Secondary | ICD-10-CM | POA: Diagnosis not present

## 2020-04-15 DIAGNOSIS — I714 Abdominal aortic aneurysm, without rupture: Secondary | ICD-10-CM | POA: Diagnosis not present

## 2020-04-15 DIAGNOSIS — Z7982 Long term (current) use of aspirin: Secondary | ICD-10-CM | POA: Diagnosis not present

## 2020-04-15 DIAGNOSIS — R270 Ataxia, unspecified: Secondary | ICD-10-CM | POA: Diagnosis not present

## 2020-04-15 DIAGNOSIS — E559 Vitamin D deficiency, unspecified: Secondary | ICD-10-CM | POA: Diagnosis not present

## 2020-04-15 DIAGNOSIS — I10 Essential (primary) hypertension: Secondary | ICD-10-CM | POA: Diagnosis not present

## 2020-04-15 DIAGNOSIS — F0280 Dementia in other diseases classified elsewhere without behavioral disturbance: Secondary | ICD-10-CM | POA: Diagnosis not present

## 2020-04-15 DIAGNOSIS — Z87891 Personal history of nicotine dependence: Secondary | ICD-10-CM | POA: Diagnosis not present

## 2020-04-16 DIAGNOSIS — G3183 Dementia with Lewy bodies: Secondary | ICD-10-CM | POA: Diagnosis not present

## 2020-04-16 DIAGNOSIS — R270 Ataxia, unspecified: Secondary | ICD-10-CM | POA: Diagnosis not present

## 2020-04-16 DIAGNOSIS — E119 Type 2 diabetes mellitus without complications: Secondary | ICD-10-CM | POA: Diagnosis not present

## 2020-04-16 DIAGNOSIS — I10 Essential (primary) hypertension: Secondary | ICD-10-CM | POA: Diagnosis not present

## 2020-04-16 DIAGNOSIS — F0280 Dementia in other diseases classified elsewhere without behavioral disturbance: Secondary | ICD-10-CM | POA: Diagnosis not present

## 2020-04-16 DIAGNOSIS — G309 Alzheimer's disease, unspecified: Secondary | ICD-10-CM | POA: Diagnosis not present

## 2020-04-19 DIAGNOSIS — I10 Essential (primary) hypertension: Secondary | ICD-10-CM | POA: Diagnosis not present

## 2020-04-19 DIAGNOSIS — E119 Type 2 diabetes mellitus without complications: Secondary | ICD-10-CM | POA: Diagnosis not present

## 2020-04-19 DIAGNOSIS — F0280 Dementia in other diseases classified elsewhere without behavioral disturbance: Secondary | ICD-10-CM | POA: Diagnosis not present

## 2020-04-19 DIAGNOSIS — G3183 Dementia with Lewy bodies: Secondary | ICD-10-CM | POA: Diagnosis not present

## 2020-04-19 DIAGNOSIS — G309 Alzheimer's disease, unspecified: Secondary | ICD-10-CM | POA: Diagnosis not present

## 2020-04-19 DIAGNOSIS — R270 Ataxia, unspecified: Secondary | ICD-10-CM | POA: Diagnosis not present

## 2020-04-22 DIAGNOSIS — R270 Ataxia, unspecified: Secondary | ICD-10-CM | POA: Diagnosis not present

## 2020-04-22 DIAGNOSIS — G309 Alzheimer's disease, unspecified: Secondary | ICD-10-CM | POA: Diagnosis not present

## 2020-04-22 DIAGNOSIS — I10 Essential (primary) hypertension: Secondary | ICD-10-CM | POA: Diagnosis not present

## 2020-04-22 DIAGNOSIS — F0280 Dementia in other diseases classified elsewhere without behavioral disturbance: Secondary | ICD-10-CM | POA: Diagnosis not present

## 2020-04-22 DIAGNOSIS — G3183 Dementia with Lewy bodies: Secondary | ICD-10-CM | POA: Diagnosis not present

## 2020-04-22 DIAGNOSIS — E119 Type 2 diabetes mellitus without complications: Secondary | ICD-10-CM | POA: Diagnosis not present

## 2020-04-26 DIAGNOSIS — E119 Type 2 diabetes mellitus without complications: Secondary | ICD-10-CM | POA: Diagnosis not present

## 2020-04-26 DIAGNOSIS — G309 Alzheimer's disease, unspecified: Secondary | ICD-10-CM | POA: Diagnosis not present

## 2020-04-26 DIAGNOSIS — F0280 Dementia in other diseases classified elsewhere without behavioral disturbance: Secondary | ICD-10-CM | POA: Diagnosis not present

## 2020-04-26 DIAGNOSIS — R270 Ataxia, unspecified: Secondary | ICD-10-CM | POA: Diagnosis not present

## 2020-04-26 DIAGNOSIS — G3183 Dementia with Lewy bodies: Secondary | ICD-10-CM | POA: Diagnosis not present

## 2020-04-26 DIAGNOSIS — I10 Essential (primary) hypertension: Secondary | ICD-10-CM | POA: Diagnosis not present

## 2020-05-03 ENCOUNTER — Other Ambulatory Visit: Payer: Self-pay

## 2020-05-03 ENCOUNTER — Ambulatory Visit (INDEPENDENT_AMBULATORY_CARE_PROVIDER_SITE_OTHER): Payer: Medicare Other

## 2020-05-03 ENCOUNTER — Encounter: Payer: Self-pay | Admitting: Family Medicine

## 2020-05-03 ENCOUNTER — Telehealth: Payer: Self-pay

## 2020-05-03 ENCOUNTER — Other Ambulatory Visit: Payer: Self-pay | Admitting: Family Medicine

## 2020-05-03 DIAGNOSIS — E785 Hyperlipidemia, unspecified: Secondary | ICD-10-CM

## 2020-05-03 DIAGNOSIS — I714 Abdominal aortic aneurysm, without rupture: Secondary | ICD-10-CM | POA: Diagnosis not present

## 2020-05-03 DIAGNOSIS — I10 Essential (primary) hypertension: Secondary | ICD-10-CM | POA: Diagnosis not present

## 2020-05-03 DIAGNOSIS — R270 Ataxia, unspecified: Secondary | ICD-10-CM

## 2020-05-03 DIAGNOSIS — Z7982 Long term (current) use of aspirin: Secondary | ICD-10-CM | POA: Diagnosis not present

## 2020-05-03 DIAGNOSIS — G3183 Dementia with Lewy bodies: Secondary | ICD-10-CM | POA: Diagnosis not present

## 2020-05-03 DIAGNOSIS — E119 Type 2 diabetes mellitus without complications: Secondary | ICD-10-CM

## 2020-05-03 DIAGNOSIS — E559 Vitamin D deficiency, unspecified: Secondary | ICD-10-CM | POA: Diagnosis not present

## 2020-05-03 DIAGNOSIS — Z993 Dependence on wheelchair: Secondary | ICD-10-CM

## 2020-05-03 DIAGNOSIS — F0280 Dementia in other diseases classified elsewhere without behavioral disturbance: Secondary | ICD-10-CM

## 2020-05-03 DIAGNOSIS — Z87891 Personal history of nicotine dependence: Secondary | ICD-10-CM | POA: Diagnosis not present

## 2020-05-03 DIAGNOSIS — R300 Dysuria: Secondary | ICD-10-CM

## 2020-05-03 DIAGNOSIS — G309 Alzheimer's disease, unspecified: Secondary | ICD-10-CM | POA: Diagnosis not present

## 2020-05-03 NOTE — Telephone Encounter (Signed)
Okay 

## 2020-05-03 NOTE — Telephone Encounter (Signed)
Jon [name] with Jon [name] is calling to see if Dr. Darlyn Read would be in agreement with Kallie Edward, NP to administer Ativan (low dose) 2 x a day as needed for [name].  [name] is becoming really agitated when they are trying to bathe or handle personal [name] for him.  Please call and let her know if you agree/ok this.  Thank you.

## 2020-05-03 NOTE — Telephone Encounter (Signed)
Have her bring in specimen Tuesday. Put him on my Wednesday acute clinic as a televisit.

## 2020-05-03 NOTE — Telephone Encounter (Signed)
Wife called requesting that Dr Darlyn Read put in order for pt to have urine sample dropped off to be tested for UTI. Explained to wife that pt would need an appt for this. Wife refused appt and wanted me to send message for Dr Darlyn Read to just send order.

## 2020-05-03 NOTE — Telephone Encounter (Signed)
Verbal order given  

## 2020-05-04 ENCOUNTER — Ambulatory Visit (INDEPENDENT_AMBULATORY_CARE_PROVIDER_SITE_OTHER): Payer: Medicare Other | Admitting: Family Medicine

## 2020-05-04 DIAGNOSIS — R8290 Unspecified abnormal findings in urine: Secondary | ICD-10-CM | POA: Diagnosis not present

## 2020-05-04 LAB — URINALYSIS, COMPLETE
Bilirubin, UA: NEGATIVE
Ketones, UA: NEGATIVE
Leukocytes,UA: NEGATIVE
Nitrite, UA: NEGATIVE
Protein,UA: NEGATIVE
RBC, UA: NEGATIVE
Specific Gravity, UA: 1.02 (ref 1.005–1.030)
Urobilinogen, Ur: 1 mg/dL (ref 0.2–1.0)
pH, UA: 6.5 (ref 5.0–7.5)

## 2020-05-04 NOTE — Progress Notes (Signed)
Telephone visit  Subjective: CC:?  UTI PCP: Jon Fraise, MD QBH:ALPF Jon [name] is a 74 y.o. male calls for telephone consult today. Patient provides verbal consent for consult held via phone.  Due to COVID-19 pandemic this visit was conducted virtually. This visit type was conducted due to national recommendations for restrictions regarding the COVID-19 Pandemic (e.g. social distancing, sheltering in place) in an effort to limit this patient's exposure and mitigate transmission in our community. All issues noted in this document were discussed and addressed.  A physical exam was not performed with this format.   Location of patient: Home Location of provider: [facility] Others present for call: Jon [name]  1. Urinary symptoms Patient reports a couple day h/o cloudy urine and moodiness.  The last time he had similar symptoms he had a UTI.  He is not had any changes in urinary frequency, urgency.no dysuria, hematuria, fevers, chills, abdominal pain, nausea, vomiting. Patient has used nothing for symptoms.  Patient is bed ridden and much of this information is provided by his wife  ROS: Per HPI  Allergies  Allergen Reactions  . Donepezil Nausea And Vomiting  . Namenda [Memantine Hcl] Other (See Comments)    Sleeps all the time  . Phenergan [Promethazine Hcl] Other (See Comments)    Aggressive     Past Medical History:  Diagnosis Date  . AAA (abdominal aortic aneurysm) ([place])   . Alzheimer disease ([place])   . Anemia   . Ataxia   . Diabetes mellitus without complication ([place])   . History of gallstones   . Hyperlipidemia   . Hypertension   . Vitamin D deficiency     Current Outpatient Medications:  .  Acetaminophen (TYLENOL ARTHRITIS EXT RELIEF PO), Take by mouth., Disp: , Rfl:  .  aspirin 81 MG tablet, Take 81 mg by mouth daily., Disp: , Rfl:  .  blood glucose meter kit and supplies, Patient requests a Freestyle Meter. Use up to four times daily as directed. (FOR ICD-10 E10.9,  E11.9)., Disp: 1 each, Rfl: 0 .  Blood Glucose Monitoring Suppl (FREESTYLE FREEDOM LITE) w/Device KIT, USE TO CHECK FASTING AND 2 HOURS AFTER BIGGEST MEAL Dx E11.8, Disp: 1 each, Rfl: 0 .  Cholecalciferol (VITAMIN D) 50 MCG (2000 UT) CAPS, Take 1 capsule by mouth daily., Disp: , Rfl:  .  escitalopram (LEXAPRO) 10 MG tablet, TAKE 1 TABLET DAILY), Disp: , Rfl:  .  glucose blood (FREESTYLE LITE) test strip, USE TO CHECK FASTING AND 2 HOURS AFTER BIGGEST MEAL, Disp: 200 each, Rfl: 3 .  metFORMIN (GLUCOPHAGE) 1000 MG tablet, TAKE 1 TABLET DAILY WITH BREAKFAST, Disp: 90 tablet, Rfl: 0 .  omeprazole (PRILOSEC) 20 MG capsule, TAKE 1 CAPSULE DAILY, Disp: 90 capsule, Rfl: 3 .  rivastigmine (EXELON) 4.5 MG capsule, TAKE 1 CAPSULE TWICE A DAY, Disp: 180 capsule, Rfl: 3 .  SURE COMFORT PEN NEEDLES 32G X 6 MM MISC, USE AS DIRECTED, Disp: 100 each, Rfl: 3 .  tamsulosin (FLOMAX) 0.4 MG CAPS capsule, Take by mouth., Disp: , Rfl:   Assessment/ Plan: 74 y.o. male   1. Cloudy urine No evidence of UTI. Will send for culture.  Sugar in urine.  Wife will continue to monitor BGs.  Will contact with urine culture resuts. - Urinalysis, Complete - Urine Culture   Start time: 12:29pm; Called again with results and recommendations at 4:08pm End time: 12:34pm; 4:10pm  Total time spent on patient care (including telephone call/ virtual visit): 7 minutes  Jon [name] Jon [name]  Jon [name], [place] [phone]

## 2020-05-04 NOTE — Telephone Encounter (Signed)
Appt today

## 2020-05-05 DIAGNOSIS — R270 Ataxia, unspecified: Secondary | ICD-10-CM | POA: Diagnosis not present

## 2020-05-05 DIAGNOSIS — E119 Type 2 diabetes mellitus without complications: Secondary | ICD-10-CM | POA: Diagnosis not present

## 2020-05-05 DIAGNOSIS — I10 Essential (primary) hypertension: Secondary | ICD-10-CM | POA: Diagnosis not present

## 2020-05-05 DIAGNOSIS — G309 Alzheimer's disease, unspecified: Secondary | ICD-10-CM | POA: Diagnosis not present

## 2020-05-05 DIAGNOSIS — G3183 Dementia with Lewy bodies: Secondary | ICD-10-CM | POA: Diagnosis not present

## 2020-05-05 DIAGNOSIS — F0280 Dementia in other diseases classified elsewhere without behavioral disturbance: Secondary | ICD-10-CM | POA: Diagnosis not present

## 2020-05-06 LAB — URINE CULTURE

## 2020-05-07 DIAGNOSIS — I10 Essential (primary) hypertension: Secondary | ICD-10-CM | POA: Diagnosis not present

## 2020-05-07 DIAGNOSIS — G309 Alzheimer's disease, unspecified: Secondary | ICD-10-CM | POA: Diagnosis not present

## 2020-05-07 DIAGNOSIS — E119 Type 2 diabetes mellitus without complications: Secondary | ICD-10-CM | POA: Diagnosis not present

## 2020-05-07 DIAGNOSIS — F0280 Dementia in other diseases classified elsewhere without behavioral disturbance: Secondary | ICD-10-CM | POA: Diagnosis not present

## 2020-05-07 DIAGNOSIS — R270 Ataxia, unspecified: Secondary | ICD-10-CM | POA: Diagnosis not present

## 2020-05-07 DIAGNOSIS — G3183 Dementia with Lewy bodies: Secondary | ICD-10-CM | POA: Diagnosis not present

## 2020-05-11 DIAGNOSIS — E119 Type 2 diabetes mellitus without complications: Secondary | ICD-10-CM | POA: Diagnosis not present

## 2020-05-11 DIAGNOSIS — G309 Alzheimer's disease, unspecified: Secondary | ICD-10-CM | POA: Diagnosis not present

## 2020-05-11 DIAGNOSIS — F0280 Dementia in other diseases classified elsewhere without behavioral disturbance: Secondary | ICD-10-CM | POA: Diagnosis not present

## 2020-05-11 DIAGNOSIS — G3183 Dementia with Lewy bodies: Secondary | ICD-10-CM | POA: Diagnosis not present

## 2020-05-11 DIAGNOSIS — R270 Ataxia, unspecified: Secondary | ICD-10-CM | POA: Diagnosis not present

## 2020-05-11 DIAGNOSIS — I10 Essential (primary) hypertension: Secondary | ICD-10-CM | POA: Diagnosis not present

## 2020-05-15 DIAGNOSIS — R270 Ataxia, unspecified: Secondary | ICD-10-CM | POA: Diagnosis not present

## 2020-05-15 DIAGNOSIS — E559 Vitamin D deficiency, unspecified: Secondary | ICD-10-CM | POA: Diagnosis not present

## 2020-05-15 DIAGNOSIS — Z7982 Long term (current) use of aspirin: Secondary | ICD-10-CM | POA: Diagnosis not present

## 2020-05-15 DIAGNOSIS — F0280 Dementia in other diseases classified elsewhere without behavioral disturbance: Secondary | ICD-10-CM | POA: Diagnosis not present

## 2020-05-15 DIAGNOSIS — G3183 Dementia with Lewy bodies: Secondary | ICD-10-CM | POA: Diagnosis not present

## 2020-05-15 DIAGNOSIS — G309 Alzheimer's disease, unspecified: Secondary | ICD-10-CM | POA: Diagnosis not present

## 2020-05-15 DIAGNOSIS — Z87891 Personal history of nicotine dependence: Secondary | ICD-10-CM | POA: Diagnosis not present

## 2020-05-15 DIAGNOSIS — I714 Abdominal aortic aneurysm, without rupture: Secondary | ICD-10-CM | POA: Diagnosis not present

## 2020-05-15 DIAGNOSIS — E785 Hyperlipidemia, unspecified: Secondary | ICD-10-CM | POA: Diagnosis not present

## 2020-05-15 DIAGNOSIS — E119 Type 2 diabetes mellitus without complications: Secondary | ICD-10-CM | POA: Diagnosis not present

## 2020-05-15 DIAGNOSIS — I10 Essential (primary) hypertension: Secondary | ICD-10-CM | POA: Diagnosis not present

## 2020-05-15 DIAGNOSIS — Z993 Dependence on wheelchair: Secondary | ICD-10-CM | POA: Diagnosis not present

## 2020-05-18 DIAGNOSIS — G3183 Dementia with Lewy bodies: Secondary | ICD-10-CM | POA: Diagnosis not present

## 2020-05-18 DIAGNOSIS — R270 Ataxia, unspecified: Secondary | ICD-10-CM | POA: Diagnosis not present

## 2020-05-18 DIAGNOSIS — I10 Essential (primary) hypertension: Secondary | ICD-10-CM | POA: Diagnosis not present

## 2020-05-18 DIAGNOSIS — E119 Type 2 diabetes mellitus without complications: Secondary | ICD-10-CM | POA: Diagnosis not present

## 2020-05-18 DIAGNOSIS — G309 Alzheimer's disease, unspecified: Secondary | ICD-10-CM | POA: Diagnosis not present

## 2020-05-18 DIAGNOSIS — F0280 Dementia in other diseases classified elsewhere without behavioral disturbance: Secondary | ICD-10-CM | POA: Diagnosis not present

## 2020-05-20 DIAGNOSIS — G309 Alzheimer's disease, unspecified: Secondary | ICD-10-CM | POA: Diagnosis not present

## 2020-05-20 DIAGNOSIS — I10 Essential (primary) hypertension: Secondary | ICD-10-CM | POA: Diagnosis not present

## 2020-05-20 DIAGNOSIS — R270 Ataxia, unspecified: Secondary | ICD-10-CM | POA: Diagnosis not present

## 2020-05-20 DIAGNOSIS — E119 Type 2 diabetes mellitus without complications: Secondary | ICD-10-CM | POA: Diagnosis not present

## 2020-05-20 DIAGNOSIS — G3183 Dementia with Lewy bodies: Secondary | ICD-10-CM | POA: Diagnosis not present

## 2020-05-20 DIAGNOSIS — F0280 Dementia in other diseases classified elsewhere without behavioral disturbance: Secondary | ICD-10-CM | POA: Diagnosis not present

## 2020-05-24 DIAGNOSIS — G3183 Dementia with Lewy bodies: Secondary | ICD-10-CM | POA: Diagnosis not present

## 2020-05-24 DIAGNOSIS — G309 Alzheimer's disease, unspecified: Secondary | ICD-10-CM | POA: Diagnosis not present

## 2020-05-24 DIAGNOSIS — I10 Essential (primary) hypertension: Secondary | ICD-10-CM | POA: Diagnosis not present

## 2020-05-24 DIAGNOSIS — R270 Ataxia, unspecified: Secondary | ICD-10-CM | POA: Diagnosis not present

## 2020-05-24 DIAGNOSIS — F0280 Dementia in other diseases classified elsewhere without behavioral disturbance: Secondary | ICD-10-CM | POA: Diagnosis not present

## 2020-05-24 DIAGNOSIS — E119 Type 2 diabetes mellitus without complications: Secondary | ICD-10-CM | POA: Diagnosis not present

## 2020-06-01 ENCOUNTER — Telehealth: Payer: Self-pay

## 2020-06-01 DIAGNOSIS — F0280 Dementia in other diseases classified elsewhere without behavioral disturbance: Secondary | ICD-10-CM | POA: Diagnosis not present

## 2020-06-01 DIAGNOSIS — R270 Ataxia, unspecified: Secondary | ICD-10-CM | POA: Diagnosis not present

## 2020-06-01 DIAGNOSIS — E119 Type 2 diabetes mellitus without complications: Secondary | ICD-10-CM | POA: Diagnosis not present

## 2020-06-01 DIAGNOSIS — I10 Essential (primary) hypertension: Secondary | ICD-10-CM | POA: Diagnosis not present

## 2020-06-01 DIAGNOSIS — G309 Alzheimer's disease, unspecified: Secondary | ICD-10-CM | POA: Diagnosis not present

## 2020-06-01 DIAGNOSIS — G3183 Dementia with Lewy bodies: Secondary | ICD-10-CM | POA: Diagnosis not present

## 2020-06-01 NOTE — Telephone Encounter (Signed)
Spoke with wife - and she states that Dr. Darlyn Read told her to call the office to get patient in on Friday to talk with him about his BS? This is your walk in day.  Please advise

## 2020-06-01 NOTE — Telephone Encounter (Signed)
Yes, I spoke to her and he does have a need that can't wait.

## 2020-06-02 NOTE — Telephone Encounter (Signed)
Appointment scheduled.

## 2020-06-03 DIAGNOSIS — G309 Alzheimer's disease, unspecified: Secondary | ICD-10-CM | POA: Diagnosis not present

## 2020-06-03 DIAGNOSIS — I10 Essential (primary) hypertension: Secondary | ICD-10-CM | POA: Diagnosis not present

## 2020-06-03 DIAGNOSIS — F0280 Dementia in other diseases classified elsewhere without behavioral disturbance: Secondary | ICD-10-CM | POA: Diagnosis not present

## 2020-06-03 DIAGNOSIS — R270 Ataxia, unspecified: Secondary | ICD-10-CM | POA: Diagnosis not present

## 2020-06-03 DIAGNOSIS — G3183 Dementia with Lewy bodies: Secondary | ICD-10-CM | POA: Diagnosis not present

## 2020-06-03 DIAGNOSIS — E119 Type 2 diabetes mellitus without complications: Secondary | ICD-10-CM | POA: Diagnosis not present

## 2020-06-04 ENCOUNTER — Ambulatory Visit (INDEPENDENT_AMBULATORY_CARE_PROVIDER_SITE_OTHER): Payer: Medicare Other | Admitting: Family Medicine

## 2020-06-04 DIAGNOSIS — F0281 Dementia in other diseases classified elsewhere with behavioral disturbance: Secondary | ICD-10-CM | POA: Diagnosis not present

## 2020-06-04 DIAGNOSIS — E119 Type 2 diabetes mellitus without complications: Secondary | ICD-10-CM

## 2020-06-04 DIAGNOSIS — Z794 Long term (current) use of insulin: Secondary | ICD-10-CM

## 2020-06-04 DIAGNOSIS — R34 Anuria and oliguria: Secondary | ICD-10-CM

## 2020-06-04 DIAGNOSIS — R82998 Other abnormal findings in urine: Secondary | ICD-10-CM

## 2020-06-04 DIAGNOSIS — G3183 Dementia with Lewy bodies: Secondary | ICD-10-CM

## 2020-06-04 MED ORDER — METFORMIN HCL 1000 MG PO TABS: 1000.0000 mg | ORAL_TABLET | Freq: Every day | ORAL | 1 refills | Status: DC

## 2020-06-04 NOTE — Progress Notes (Signed)
Subjective:    Patient ID: Jon [name], male    DOB: [birth year], 74 y.o.   MRN: [id number]   HPI: Jon [name] is a 74 y.o. male presenting for elevated glucose. Fasting glucose is running 156, 136,112 today. Urinating less oftern and not at night. He isn't drinking as much. Urine is a little dark and cloudy.   Depression screen [facility] 2/9 02/13/2020 02/25/2019 02/14/2019 08/23/2018 05/03/2018  Decreased Interest 0 0 0 0 0  Down, Depressed, Hopeless 0 0 0 0 0  PHQ - 2 Score 0 0 0 0 0  Altered sleeping - - - - -  Tired, decreased energy - - - - -  Change in appetite - - - - -  Feeling bad or failure about yourself  - - - - -  Trouble concentrating - - - - -  Moving slowly or fidgety/restless - - - - -  Suicidal thoughts - - - - -  PHQ-9 Score - - - - -  Some recent data might be hidden     Relevant past medical, surgical, family and social history reviewed and updated as indicated.  Interim medical history since our last visit reviewed. Allergies and medications reviewed and updated.  ROS:  Review of Systems  Constitutional: Negative for fever.  Respiratory: Negative for shortness of breath.   Cardiovascular: Negative for chest pain.  Musculoskeletal: Positive for arthralgias.  Skin: Negative for rash.  Psychiatric/Behavioral: Positive for confusion and decreased concentration.     Social History   Tobacco Use  Smoking Status Former Smoker  . Packs/day: 1.00  . Years: 40.00  . Pack years: 40.00  . Types: Cigarettes  . Start date: 07/03/1966  . Quit date: 02/06/2009  . Years since quitting: 11.3  Smokeless Tobacco Never Used       Objective:     Wt Readings from Last 3 Encounters:  08/12/18 178 lb 9.2 oz (81 kg)  05/03/18 178 lb 12.8 oz (81.1 kg)  04/08/18 195 lb (88.5 kg)     Exam deferred. Pt. Harboring due to COVID 19. Phone visit performed.   Assessment & Plan:   1. Diabetes mellitus type 2, insulin dependent (HCC)   2. Dark urine   3. Decreased urine  output   4. Lewy body dementia with behavioral disturbance (HCC)     Meds ordered this encounter  Medications  . metFORMIN (GLUCOPHAGE) 1000 MG tablet    Sig: Take 1 tablet (1,000 mg total) by mouth daily with breakfast.    Dispense:  90 tablet    Refill:  1    Orders Placed This Encounter  Procedures  . Urine Culture  . Microscopic Examination  . Urinalysis, Routine w reflex microscopic      Diagnoses and all orders for this visit:  Diabetes mellitus type 2, insulin dependent (HCC) -     metFORMIN (GLUCOPHAGE) 1000 MG tablet; Take 1 tablet (1,000 mg total) by mouth daily with breakfast.  Dark urine -     Urine Culture -     Urinalysis, Routine w reflex microscopic -     Microscopic Examination  Decreased urine output -     Urine Culture -     Urinalysis, Routine w reflex microscopic -     Microscopic Examination  Lewy body dementia with behavioral disturbance (HCC)    Virtual Visit via telephone Note  I discussed the limitations, risks, security and privacy concerns of performing an evaluation and management service by  telephone and the availability of in person appointments. The patient was identified with two identifiers. Pt.expressed understanding and agreed to proceed. Pt. Is at home. Dr. Darlyn Read is in his office.  Follow Up Instructions:   I discussed the assessment and treatment plan with the patient. The patient was provided an opportunity to ask questions and all were answered. The patient agreed with the plan and demonstrated an understanding of the instructions.   The patient was advised to call back or seek an in-person evaluation if the symptoms worsen or if the condition fails to improve as anticipated.   Total minutes including chart review and phone contact time: 32   Follow up plan: Return in about 3 months (around 09/02/2020), or if symptoms worsen or fail to improve.  Mechele Claude, MD [place] [facility]

## 2020-06-07 ENCOUNTER — Other Ambulatory Visit: Payer: Self-pay

## 2020-06-07 ENCOUNTER — Other Ambulatory Visit: Payer: Medicare Other

## 2020-06-07 ENCOUNTER — Encounter: Payer: Self-pay | Admitting: Family Medicine

## 2020-06-07 DIAGNOSIS — R82998 Other abnormal findings in urine: Secondary | ICD-10-CM | POA: Diagnosis not present

## 2020-06-07 DIAGNOSIS — R34 Anuria and oliguria: Secondary | ICD-10-CM | POA: Diagnosis not present

## 2020-06-07 LAB — URINALYSIS, ROUTINE W REFLEX MICROSCOPIC
Bilirubin, UA: NEGATIVE
Glucose, UA: NEGATIVE
Ketones, UA: NEGATIVE
Leukocytes,UA: NEGATIVE
Nitrite, UA: NEGATIVE
Protein,UA: NEGATIVE
RBC, UA: NEGATIVE
Specific Gravity, UA: 1.025 (ref 1.005–1.030)
Urobilinogen, Ur: 4 mg/dL — ABNORMAL HIGH (ref 0.2–1.0)
pH, UA: 6 (ref 5.0–7.5)

## 2020-06-07 LAB — MICROSCOPIC EXAMINATION

## 2020-06-08 DIAGNOSIS — G309 Alzheimer's disease, unspecified: Secondary | ICD-10-CM | POA: Diagnosis not present

## 2020-06-08 DIAGNOSIS — G3183 Dementia with Lewy bodies: Secondary | ICD-10-CM | POA: Diagnosis not present

## 2020-06-08 DIAGNOSIS — E119 Type 2 diabetes mellitus without complications: Secondary | ICD-10-CM | POA: Diagnosis not present

## 2020-06-08 DIAGNOSIS — F0280 Dementia in other diseases classified elsewhere without behavioral disturbance: Secondary | ICD-10-CM | POA: Diagnosis not present

## 2020-06-08 DIAGNOSIS — R270 Ataxia, unspecified: Secondary | ICD-10-CM | POA: Diagnosis not present

## 2020-06-08 DIAGNOSIS — I10 Essential (primary) hypertension: Secondary | ICD-10-CM | POA: Diagnosis not present

## 2020-06-09 LAB — URINE CULTURE

## 2020-06-11 DIAGNOSIS — G309 Alzheimer's disease, unspecified: Secondary | ICD-10-CM | POA: Diagnosis not present

## 2020-06-11 DIAGNOSIS — G3183 Dementia with Lewy bodies: Secondary | ICD-10-CM | POA: Diagnosis not present

## 2020-06-11 DIAGNOSIS — R270 Ataxia, unspecified: Secondary | ICD-10-CM | POA: Diagnosis not present

## 2020-06-11 DIAGNOSIS — E119 Type 2 diabetes mellitus without complications: Secondary | ICD-10-CM | POA: Diagnosis not present

## 2020-06-11 DIAGNOSIS — I10 Essential (primary) hypertension: Secondary | ICD-10-CM | POA: Diagnosis not present

## 2020-06-11 DIAGNOSIS — F0280 Dementia in other diseases classified elsewhere without behavioral disturbance: Secondary | ICD-10-CM | POA: Diagnosis not present

## 2020-06-14 DIAGNOSIS — R270 Ataxia, unspecified: Secondary | ICD-10-CM | POA: Diagnosis not present

## 2020-06-14 DIAGNOSIS — G3183 Dementia with Lewy bodies: Secondary | ICD-10-CM | POA: Diagnosis not present

## 2020-06-14 DIAGNOSIS — E119 Type 2 diabetes mellitus without complications: Secondary | ICD-10-CM | POA: Diagnosis not present

## 2020-06-14 DIAGNOSIS — Z993 Dependence on wheelchair: Secondary | ICD-10-CM | POA: Diagnosis not present

## 2020-06-14 DIAGNOSIS — Z7984 Long term (current) use of oral hypoglycemic drugs: Secondary | ICD-10-CM | POA: Diagnosis not present

## 2020-06-14 DIAGNOSIS — I714 Abdominal aortic aneurysm, without rupture: Secondary | ICD-10-CM | POA: Diagnosis not present

## 2020-06-14 DIAGNOSIS — G309 Alzheimer's disease, unspecified: Secondary | ICD-10-CM | POA: Diagnosis not present

## 2020-06-14 DIAGNOSIS — I10 Essential (primary) hypertension: Secondary | ICD-10-CM | POA: Diagnosis not present

## 2020-06-14 DIAGNOSIS — Z7982 Long term (current) use of aspirin: Secondary | ICD-10-CM | POA: Diagnosis not present

## 2020-06-14 DIAGNOSIS — Z87891 Personal history of nicotine dependence: Secondary | ICD-10-CM | POA: Diagnosis not present

## 2020-06-14 DIAGNOSIS — E559 Vitamin D deficiency, unspecified: Secondary | ICD-10-CM | POA: Diagnosis not present

## 2020-06-14 DIAGNOSIS — F0280 Dementia in other diseases classified elsewhere without behavioral disturbance: Secondary | ICD-10-CM | POA: Diagnosis not present

## 2020-06-14 DIAGNOSIS — E785 Hyperlipidemia, unspecified: Secondary | ICD-10-CM | POA: Diagnosis not present

## 2020-06-15 ENCOUNTER — Telehealth: Payer: Self-pay

## 2020-06-15 ENCOUNTER — Telehealth: Payer: Self-pay | Admitting: Family Medicine

## 2020-06-15 DIAGNOSIS — R82998 Other abnormal findings in urine: Secondary | ICD-10-CM

## 2020-06-15 NOTE — Telephone Encounter (Signed)
Pt had gave a urine sample last Monday 12/6 - he is having lower back pain, urine is darker and swelling in the right side of his neck. Would like to discuss results

## 2020-06-15 NOTE — Telephone Encounter (Signed)
Spoke with wife, she will bring in urine sample tomorrow.  She will ask nurse if she can draw blood and let us know.

## 2020-06-15 NOTE — Telephone Encounter (Signed)
The culture didn't show a UTI really but wanted you to just look at it since pt is having issues.

## 2020-06-15 NOTE — Telephone Encounter (Signed)
Pts wife called to let nurse know that the nurse that comes to see pt does not draw blood work. Wife says that she will do her best to get pt here to the office one way or another to have blood work done hopefully tomorrow.  (regarding telephone message from earlier today)

## 2020-06-15 NOTE — Telephone Encounter (Signed)
Can she bring in a new specimen since he is having symptoms. Also, can their nurse draw bloodwork for CBC & CMP?

## 2020-06-16 ENCOUNTER — Other Ambulatory Visit: Payer: Medicare Other

## 2020-06-16 ENCOUNTER — Other Ambulatory Visit: Payer: Self-pay

## 2020-06-16 DIAGNOSIS — R82998 Other abnormal findings in urine: Secondary | ICD-10-CM | POA: Diagnosis not present

## 2020-06-16 DIAGNOSIS — G309 Alzheimer's disease, unspecified: Secondary | ICD-10-CM | POA: Diagnosis not present

## 2020-06-16 DIAGNOSIS — R34 Anuria and oliguria: Secondary | ICD-10-CM

## 2020-06-16 DIAGNOSIS — G3183 Dementia with Lewy bodies: Secondary | ICD-10-CM | POA: Diagnosis not present

## 2020-06-16 DIAGNOSIS — R739 Hyperglycemia, unspecified: Secondary | ICD-10-CM | POA: Diagnosis not present

## 2020-06-16 DIAGNOSIS — I10 Essential (primary) hypertension: Secondary | ICD-10-CM | POA: Diagnosis not present

## 2020-06-16 DIAGNOSIS — R270 Ataxia, unspecified: Secondary | ICD-10-CM | POA: Diagnosis not present

## 2020-06-16 DIAGNOSIS — E119 Type 2 diabetes mellitus without complications: Secondary | ICD-10-CM | POA: Diagnosis not present

## 2020-06-16 DIAGNOSIS — F0280 Dementia in other diseases classified elsewhere without behavioral disturbance: Secondary | ICD-10-CM | POA: Diagnosis not present

## 2020-06-16 LAB — MICROSCOPIC EXAMINATION

## 2020-06-16 LAB — URINALYSIS, COMPLETE
Bilirubin, UA: NEGATIVE
Glucose, UA: NEGATIVE
Ketones, UA: NEGATIVE
Leukocytes,UA: NEGATIVE
Nitrite, UA: NEGATIVE
Protein,UA: NEGATIVE
RBC, UA: NEGATIVE
Specific Gravity, UA: 1.015 (ref 1.005–1.030)
Urobilinogen, Ur: 1 mg/dL (ref 0.2–1.0)
pH, UA: 5 (ref 5.0–7.5)

## 2020-06-16 NOTE — Telephone Encounter (Signed)
Please review

## 2020-06-17 LAB — CMP14+EGFR
ALT: 34 IU/L (ref 0–44)
AST: 32 IU/L (ref 0–40)
Albumin/Globulin Ratio: 1 — ABNORMAL LOW (ref 1.2–2.2)
Albumin: 4 g/dL (ref 3.7–4.7)
Alkaline Phosphatase: 226 IU/L — ABNORMAL HIGH (ref 44–121)
BUN/Creatinine Ratio: 22 (ref 10–24)
BUN: 18 mg/dL (ref 8–27)
Bilirubin Total: 0.6 mg/dL (ref 0.0–1.2)
CO2: 25 mmol/L (ref 20–29)
Calcium: 9.5 mg/dL (ref 8.6–10.2)
Chloride: 99 mmol/L (ref 96–106)
Creatinine, Ser: 0.83 mg/dL (ref 0.76–1.27)
GFR calc Af Amer: 102 mL/min/{1.73_m2} (ref >59)
GFR calc non Af Amer: 88 mL/min/{1.73_m2} (ref >59)
Globulin, Total: 3.9 g/dL (ref 1.5–4.5)
Glucose: 183 mg/dL — ABNORMAL HIGH (ref 65–99)
Potassium: 4.3 mmol/L (ref 3.5–5.2)
Sodium: 137 mmol/L (ref 134–144)
Total Protein: 7.9 g/dL (ref 6.0–8.5)

## 2020-06-17 LAB — CBC WITH DIFFERENTIAL/PLATELET
Basophils Absolute: 0 10*3/uL (ref 0.0–0.2)
Basos: 0 %
EOS (ABSOLUTE): 0.1 10*3/uL (ref 0.0–0.4)
Eos: 1 %
Hematocrit: 48.9 % (ref 37.5–51.0)
Hemoglobin: 16.6 g/dL (ref 13.0–17.7)
Immature Grans (Abs): 0 10*3/uL (ref 0.0–0.1)
Immature Granulocytes: 0 %
Lymphocytes Absolute: 2.5 10*3/uL (ref 0.7–3.1)
Lymphs: 32 %
MCH: 31 pg (ref 26.6–33.0)
MCHC: 33.9 g/dL (ref 31.5–35.7)
MCV: 91 fL (ref 79–97)
Monocytes Absolute: 0.7 10*3/uL (ref 0.1–0.9)
Monocytes: 9 %
Neutrophils Absolute: 4.7 10*3/uL (ref 1.4–7.0)
Neutrophils: 58 %
Platelets: 151 10*3/uL (ref 150–450)
RBC: 5.35 x10E6/uL (ref 4.14–5.80)
RDW: 12.2 % (ref 11.6–15.4)
WBC: 8.1 10*3/uL (ref 3.4–10.8)

## 2020-06-18 DIAGNOSIS — I10 Essential (primary) hypertension: Secondary | ICD-10-CM | POA: Diagnosis not present

## 2020-06-18 DIAGNOSIS — R270 Ataxia, unspecified: Secondary | ICD-10-CM | POA: Diagnosis not present

## 2020-06-18 DIAGNOSIS — G3183 Dementia with Lewy bodies: Secondary | ICD-10-CM | POA: Diagnosis not present

## 2020-06-18 DIAGNOSIS — G309 Alzheimer's disease, unspecified: Secondary | ICD-10-CM | POA: Diagnosis not present

## 2020-06-18 DIAGNOSIS — E119 Type 2 diabetes mellitus without complications: Secondary | ICD-10-CM | POA: Diagnosis not present

## 2020-06-18 DIAGNOSIS — F0280 Dementia in other diseases classified elsewhere without behavioral disturbance: Secondary | ICD-10-CM | POA: Diagnosis not present

## 2020-06-18 LAB — URINE CULTURE

## 2020-06-18 NOTE — Progress Notes (Signed)
Hello  [name],    Your lab result is normal and/or stable.Some minor variations that are not significant are commonly marked abnormal, but do not represent any medical problem for you.   Best regards,  Sonakshi Rolland, M.D.

## 2020-06-22 DIAGNOSIS — I10 Essential (primary) hypertension: Secondary | ICD-10-CM | POA: Diagnosis not present

## 2020-06-22 DIAGNOSIS — E119 Type 2 diabetes mellitus without complications: Secondary | ICD-10-CM | POA: Diagnosis not present

## 2020-06-22 DIAGNOSIS — F0280 Dementia in other diseases classified elsewhere without behavioral disturbance: Secondary | ICD-10-CM | POA: Diagnosis not present

## 2020-06-22 DIAGNOSIS — R270 Ataxia, unspecified: Secondary | ICD-10-CM | POA: Diagnosis not present

## 2020-06-22 DIAGNOSIS — G3183 Dementia with Lewy bodies: Secondary | ICD-10-CM | POA: Diagnosis not present

## 2020-06-22 DIAGNOSIS — G309 Alzheimer's disease, unspecified: Secondary | ICD-10-CM | POA: Diagnosis not present

## 2020-06-22 LAB — SPECIMEN STATUS REPORT

## 2020-06-22 LAB — HGB A1C W/O EAG: Hgb A1c MFr Bld: 6.7 % — ABNORMAL HIGH (ref 4.8–5.6)

## 2020-06-29 ENCOUNTER — Telehealth: Payer: Self-pay

## 2020-06-29 DIAGNOSIS — F0280 Dementia in other diseases classified elsewhere without behavioral disturbance: Secondary | ICD-10-CM | POA: Diagnosis not present

## 2020-06-29 DIAGNOSIS — I10 Essential (primary) hypertension: Secondary | ICD-10-CM | POA: Diagnosis not present

## 2020-06-29 DIAGNOSIS — E119 Type 2 diabetes mellitus without complications: Secondary | ICD-10-CM | POA: Diagnosis not present

## 2020-06-29 DIAGNOSIS — R270 Ataxia, unspecified: Secondary | ICD-10-CM | POA: Diagnosis not present

## 2020-06-29 DIAGNOSIS — G309 Alzheimer's disease, unspecified: Secondary | ICD-10-CM | POA: Diagnosis not present

## 2020-06-29 DIAGNOSIS — G3183 Dementia with Lewy bodies: Secondary | ICD-10-CM | POA: Diagnosis not present

## 2020-07-01 NOTE — Telephone Encounter (Signed)
I will need to see him in the office for further eval.

## 2020-07-01 NOTE — Telephone Encounter (Signed)
Spoke with wife she said a nurse is coming out to see him next week. If she needs [place] after that she will call back.

## 2020-07-06 DIAGNOSIS — M79601 Pain in right arm: Secondary | ICD-10-CM | POA: Diagnosis not present

## 2020-07-06 DIAGNOSIS — G3183 Dementia with Lewy bodies: Secondary | ICD-10-CM | POA: Diagnosis not present

## 2020-07-06 DIAGNOSIS — R5381 Other malaise: Secondary | ICD-10-CM | POA: Diagnosis not present

## 2020-07-06 DIAGNOSIS — G20 Parkinson's disease: Secondary | ICD-10-CM | POA: Diagnosis not present

## 2020-07-07 DIAGNOSIS — G309 Alzheimer's disease, unspecified: Secondary | ICD-10-CM | POA: Diagnosis not present

## 2020-07-07 DIAGNOSIS — E119 Type 2 diabetes mellitus without complications: Secondary | ICD-10-CM | POA: Diagnosis not present

## 2020-07-07 DIAGNOSIS — R270 Ataxia, unspecified: Secondary | ICD-10-CM | POA: Diagnosis not present

## 2020-07-07 DIAGNOSIS — F0280 Dementia in other diseases classified elsewhere without behavioral disturbance: Secondary | ICD-10-CM | POA: Diagnosis not present

## 2020-07-07 DIAGNOSIS — I10 Essential (primary) hypertension: Secondary | ICD-10-CM | POA: Diagnosis not present

## 2020-07-07 DIAGNOSIS — G3183 Dementia with Lewy bodies: Secondary | ICD-10-CM | POA: Diagnosis not present

## 2020-07-08 DIAGNOSIS — F0280 Dementia in other diseases classified elsewhere without behavioral disturbance: Secondary | ICD-10-CM | POA: Diagnosis not present

## 2020-07-08 DIAGNOSIS — G309 Alzheimer's disease, unspecified: Secondary | ICD-10-CM | POA: Diagnosis not present

## 2020-07-08 DIAGNOSIS — E119 Type 2 diabetes mellitus without complications: Secondary | ICD-10-CM | POA: Diagnosis not present

## 2020-07-08 DIAGNOSIS — G3183 Dementia with Lewy bodies: Secondary | ICD-10-CM | POA: Diagnosis not present

## 2020-07-08 DIAGNOSIS — R270 Ataxia, unspecified: Secondary | ICD-10-CM | POA: Diagnosis not present

## 2020-07-08 DIAGNOSIS — I10 Essential (primary) hypertension: Secondary | ICD-10-CM | POA: Diagnosis not present

## 2020-07-12 ENCOUNTER — Telehealth: Payer: Self-pay | Admitting: Family Medicine

## 2020-07-12 NOTE — Telephone Encounter (Signed)
Made appt for Feb 1st and added to wait list but pt's wife requested that a message be sent to Dr. Darlyn Read about trying to work him in. Pt is having back pain and has Alzheimer's so he did not want to see any providers other than Dr. Darlyn Read

## 2020-07-12 NOTE — Telephone Encounter (Signed)
I do need to see him. I think I have something next week. If pain is too intense, I will call in something temporary.

## 2020-07-12 NOTE — Telephone Encounter (Signed)
Spoke with wife, appointment scheduled for 07/20/2020.

## 2020-07-13 DIAGNOSIS — E119 Type 2 diabetes mellitus without complications: Secondary | ICD-10-CM | POA: Diagnosis not present

## 2020-07-13 DIAGNOSIS — G3183 Dementia with Lewy bodies: Secondary | ICD-10-CM | POA: Diagnosis not present

## 2020-07-13 DIAGNOSIS — I10 Essential (primary) hypertension: Secondary | ICD-10-CM | POA: Diagnosis not present

## 2020-07-13 DIAGNOSIS — F0280 Dementia in other diseases classified elsewhere without behavioral disturbance: Secondary | ICD-10-CM | POA: Diagnosis not present

## 2020-07-13 DIAGNOSIS — R270 Ataxia, unspecified: Secondary | ICD-10-CM | POA: Diagnosis not present

## 2020-07-13 DIAGNOSIS — G309 Alzheimer's disease, unspecified: Secondary | ICD-10-CM | POA: Diagnosis not present

## 2020-07-14 DIAGNOSIS — G309 Alzheimer's disease, unspecified: Secondary | ICD-10-CM | POA: Diagnosis not present

## 2020-07-14 DIAGNOSIS — E559 Vitamin D deficiency, unspecified: Secondary | ICD-10-CM | POA: Diagnosis not present

## 2020-07-14 DIAGNOSIS — G3183 Dementia with Lewy bodies: Secondary | ICD-10-CM | POA: Diagnosis not present

## 2020-07-14 DIAGNOSIS — Z87891 Personal history of nicotine dependence: Secondary | ICD-10-CM | POA: Diagnosis not present

## 2020-07-14 DIAGNOSIS — Z993 Dependence on wheelchair: Secondary | ICD-10-CM | POA: Diagnosis not present

## 2020-07-14 DIAGNOSIS — Z7984 Long term (current) use of oral hypoglycemic drugs: Secondary | ICD-10-CM | POA: Diagnosis not present

## 2020-07-14 DIAGNOSIS — R270 Ataxia, unspecified: Secondary | ICD-10-CM | POA: Diagnosis not present

## 2020-07-14 DIAGNOSIS — E119 Type 2 diabetes mellitus without complications: Secondary | ICD-10-CM | POA: Diagnosis not present

## 2020-07-14 DIAGNOSIS — E785 Hyperlipidemia, unspecified: Secondary | ICD-10-CM | POA: Diagnosis not present

## 2020-07-14 DIAGNOSIS — F0280 Dementia in other diseases classified elsewhere without behavioral disturbance: Secondary | ICD-10-CM | POA: Diagnosis not present

## 2020-07-14 DIAGNOSIS — I10 Essential (primary) hypertension: Secondary | ICD-10-CM | POA: Diagnosis not present

## 2020-07-14 DIAGNOSIS — Z7982 Long term (current) use of aspirin: Secondary | ICD-10-CM | POA: Diagnosis not present

## 2020-07-14 DIAGNOSIS — I714 Abdominal aortic aneurysm, without rupture: Secondary | ICD-10-CM | POA: Diagnosis not present

## 2020-07-15 DIAGNOSIS — R270 Ataxia, unspecified: Secondary | ICD-10-CM | POA: Diagnosis not present

## 2020-07-15 DIAGNOSIS — G3183 Dementia with Lewy bodies: Secondary | ICD-10-CM | POA: Diagnosis not present

## 2020-07-15 DIAGNOSIS — F0280 Dementia in other diseases classified elsewhere without behavioral disturbance: Secondary | ICD-10-CM | POA: Diagnosis not present

## 2020-07-15 DIAGNOSIS — I10 Essential (primary) hypertension: Secondary | ICD-10-CM | POA: Diagnosis not present

## 2020-07-15 DIAGNOSIS — G309 Alzheimer's disease, unspecified: Secondary | ICD-10-CM | POA: Diagnosis not present

## 2020-07-15 DIAGNOSIS — E119 Type 2 diabetes mellitus without complications: Secondary | ICD-10-CM | POA: Diagnosis not present

## 2020-07-20 ENCOUNTER — Ambulatory Visit: Payer: Medicare Other | Admitting: Family Medicine

## 2020-07-21 DIAGNOSIS — G309 Alzheimer's disease, unspecified: Secondary | ICD-10-CM | POA: Diagnosis not present

## 2020-07-21 DIAGNOSIS — G3183 Dementia with Lewy bodies: Secondary | ICD-10-CM | POA: Diagnosis not present

## 2020-07-21 DIAGNOSIS — F0280 Dementia in other diseases classified elsewhere without behavioral disturbance: Secondary | ICD-10-CM | POA: Diagnosis not present

## 2020-07-21 DIAGNOSIS — I10 Essential (primary) hypertension: Secondary | ICD-10-CM | POA: Diagnosis not present

## 2020-07-21 DIAGNOSIS — E119 Type 2 diabetes mellitus without complications: Secondary | ICD-10-CM | POA: Diagnosis not present

## 2020-07-21 DIAGNOSIS — R270 Ataxia, unspecified: Secondary | ICD-10-CM | POA: Diagnosis not present

## 2020-07-23 DIAGNOSIS — I10 Essential (primary) hypertension: Secondary | ICD-10-CM | POA: Diagnosis not present

## 2020-07-23 DIAGNOSIS — E119 Type 2 diabetes mellitus without complications: Secondary | ICD-10-CM | POA: Diagnosis not present

## 2020-07-23 DIAGNOSIS — G3183 Dementia with Lewy bodies: Secondary | ICD-10-CM | POA: Diagnosis not present

## 2020-07-23 DIAGNOSIS — G309 Alzheimer's disease, unspecified: Secondary | ICD-10-CM | POA: Diagnosis not present

## 2020-07-23 DIAGNOSIS — R270 Ataxia, unspecified: Secondary | ICD-10-CM | POA: Diagnosis not present

## 2020-07-23 DIAGNOSIS — F0280 Dementia in other diseases classified elsewhere without behavioral disturbance: Secondary | ICD-10-CM | POA: Diagnosis not present

## 2020-07-26 ENCOUNTER — Ambulatory Visit: Payer: Medicare Other | Admitting: Family Medicine

## 2020-07-27 DIAGNOSIS — I10 Essential (primary) hypertension: Secondary | ICD-10-CM | POA: Diagnosis not present

## 2020-07-27 DIAGNOSIS — R270 Ataxia, unspecified: Secondary | ICD-10-CM | POA: Diagnosis not present

## 2020-07-27 DIAGNOSIS — G3183 Dementia with Lewy bodies: Secondary | ICD-10-CM | POA: Diagnosis not present

## 2020-07-27 DIAGNOSIS — G309 Alzheimer's disease, unspecified: Secondary | ICD-10-CM | POA: Diagnosis not present

## 2020-07-27 DIAGNOSIS — F0280 Dementia in other diseases classified elsewhere without behavioral disturbance: Secondary | ICD-10-CM | POA: Diagnosis not present

## 2020-07-27 DIAGNOSIS — E119 Type 2 diabetes mellitus without complications: Secondary | ICD-10-CM | POA: Diagnosis not present

## 2020-07-30 DIAGNOSIS — F0280 Dementia in other diseases classified elsewhere without behavioral disturbance: Secondary | ICD-10-CM | POA: Diagnosis not present

## 2020-07-30 DIAGNOSIS — G309 Alzheimer's disease, unspecified: Secondary | ICD-10-CM | POA: Diagnosis not present

## 2020-07-30 DIAGNOSIS — G3183 Dementia with Lewy bodies: Secondary | ICD-10-CM | POA: Diagnosis not present

## 2020-07-30 DIAGNOSIS — E119 Type 2 diabetes mellitus without complications: Secondary | ICD-10-CM | POA: Diagnosis not present

## 2020-07-30 DIAGNOSIS — I10 Essential (primary) hypertension: Secondary | ICD-10-CM | POA: Diagnosis not present

## 2020-07-30 DIAGNOSIS — R270 Ataxia, unspecified: Secondary | ICD-10-CM | POA: Diagnosis not present

## 2020-08-02 ENCOUNTER — Other Ambulatory Visit: Payer: Self-pay

## 2020-08-02 ENCOUNTER — Ambulatory Visit (INDEPENDENT_AMBULATORY_CARE_PROVIDER_SITE_OTHER): Payer: Medicare Other

## 2020-08-02 DIAGNOSIS — G309 Alzheimer's disease, unspecified: Secondary | ICD-10-CM | POA: Diagnosis not present

## 2020-08-02 DIAGNOSIS — Z7984 Long term (current) use of oral hypoglycemic drugs: Secondary | ICD-10-CM | POA: Diagnosis not present

## 2020-08-02 DIAGNOSIS — Z7982 Long term (current) use of aspirin: Secondary | ICD-10-CM | POA: Diagnosis not present

## 2020-08-02 DIAGNOSIS — G3183 Dementia with Lewy bodies: Secondary | ICD-10-CM | POA: Diagnosis not present

## 2020-08-02 DIAGNOSIS — E119 Type 2 diabetes mellitus without complications: Secondary | ICD-10-CM

## 2020-08-02 DIAGNOSIS — Z993 Dependence on wheelchair: Secondary | ICD-10-CM

## 2020-08-02 DIAGNOSIS — E785 Hyperlipidemia, unspecified: Secondary | ICD-10-CM

## 2020-08-02 DIAGNOSIS — E559 Vitamin D deficiency, unspecified: Secondary | ICD-10-CM | POA: Diagnosis not present

## 2020-08-02 DIAGNOSIS — F0280 Dementia in other diseases classified elsewhere without behavioral disturbance: Secondary | ICD-10-CM

## 2020-08-02 DIAGNOSIS — I10 Essential (primary) hypertension: Secondary | ICD-10-CM

## 2020-08-02 DIAGNOSIS — R270 Ataxia, unspecified: Secondary | ICD-10-CM

## 2020-08-02 DIAGNOSIS — I714 Abdominal aortic aneurysm, without rupture: Secondary | ICD-10-CM | POA: Diagnosis not present

## 2020-08-02 DIAGNOSIS — Z87891 Personal history of nicotine dependence: Secondary | ICD-10-CM

## 2020-08-03 ENCOUNTER — Ambulatory Visit: Payer: Medicare Other | Admitting: Family Medicine

## 2020-08-03 DIAGNOSIS — F0280 Dementia in other diseases classified elsewhere without behavioral disturbance: Secondary | ICD-10-CM | POA: Diagnosis not present

## 2020-08-03 DIAGNOSIS — I10 Essential (primary) hypertension: Secondary | ICD-10-CM | POA: Diagnosis not present

## 2020-08-03 DIAGNOSIS — G309 Alzheimer's disease, unspecified: Secondary | ICD-10-CM | POA: Diagnosis not present

## 2020-08-03 DIAGNOSIS — R270 Ataxia, unspecified: Secondary | ICD-10-CM | POA: Diagnosis not present

## 2020-08-03 DIAGNOSIS — G3183 Dementia with Lewy bodies: Secondary | ICD-10-CM | POA: Diagnosis not present

## 2020-08-03 DIAGNOSIS — E119 Type 2 diabetes mellitus without complications: Secondary | ICD-10-CM | POA: Diagnosis not present

## 2020-08-05 ENCOUNTER — Other Ambulatory Visit: Payer: Self-pay | Admitting: Family Medicine

## 2020-08-10 ENCOUNTER — Encounter: Payer: Self-pay | Admitting: Family Medicine

## 2020-08-10 ENCOUNTER — Ambulatory Visit (INDEPENDENT_AMBULATORY_CARE_PROVIDER_SITE_OTHER): Payer: Medicare Other

## 2020-08-10 ENCOUNTER — Other Ambulatory Visit: Payer: Self-pay

## 2020-08-10 ENCOUNTER — Ambulatory Visit (INDEPENDENT_AMBULATORY_CARE_PROVIDER_SITE_OTHER): Payer: Medicare Other | Admitting: Family Medicine

## 2020-08-10 VITALS — BP 107/64 | HR 76 | Temp 97.3°F

## 2020-08-10 DIAGNOSIS — G20 Parkinson's disease: Secondary | ICD-10-CM

## 2020-08-10 DIAGNOSIS — M546 Pain in thoracic spine: Secondary | ICD-10-CM | POA: Diagnosis not present

## 2020-08-10 DIAGNOSIS — F0281 Dementia in other diseases classified elsewhere with behavioral disturbance: Secondary | ICD-10-CM | POA: Diagnosis not present

## 2020-08-10 DIAGNOSIS — G3183 Dementia with Lewy bodies: Secondary | ICD-10-CM

## 2020-08-10 NOTE — Progress Notes (Signed)
Subjective:  Patient ID: Jon [name], male    DOB: 11/20/1945  Age: 75 y.o. MRN: [id number]  CC: Lower mid back pain   HPI [name] presents for ongoing back pain in the mid back.  He points to the spine at approximately T10-T12.  Patient does have a history of lumbar compression fracture a few years ago.  Currently he is bedridden to to his Parkinson's disease combined with Lewy body dementia and COPD.  His diabetes has been well controlled recently.  The pain is exacerbated by laying down flat and by trying to bend the back to sit up.  Has a air bed that will raise and lower for him but even as the bed lowers him he will have pain as it moves him upward or downward bending or straightening the back.[name]  He uses an air bed with fluctuating levels that move the pressure points from side to side up and down.  This is in danger of deflating if the electricity goes off.  As result his wife brings in a form today from 2-hour to complete so that in the power goes off their home gets priority for getting the lower back on.  His [place] lift is also dependent on electricity and he could not leave the home in emergency without the [facility] lift further Agricultural consultant a priority should the power go out.   Depression screen [facility] 2/9 08/10/2020 02/13/2020 02/25/2019  Decreased Interest 0 0 0  Down, Depressed, Hopeless 0 0 0  PHQ - 2 Score 0 0 0  Altered sleeping - - -  Tired, decreased energy - - -  Change in appetite - - -  Feeling bad or failure about yourself  - - -  Trouble concentrating - - -  Moving slowly or fidgety/restless - - -  Suicidal thoughts - - -  PHQ-9 Score - - -  Some recent data might be hidden    History Avan has a past medical history of AAA (abdominal aortic aneurysm) (HCC), Alzheimer disease (HCC), Anemia, Ataxia, Diabetes mellitus without complication (HCC), History of gallstones, Hyperlipidemia, Hypertension, and Vitamin D deficiency.   He has a past surgical history  that includes ERCP; Inguinal hernia repair; and Tonsillectomy.   His family history includes Diabetes in his brother and mother; Hip fracture in his mother; Hyperlipidemia in his brother; Hypertension in his brother; Lupus in his sister; Pulmonary embolism in his mother; Scleroderma in his sister.He reports that he quit smoking about 11 years ago. His smoking use included cigarettes. He started smoking about 54 years ago. He has a 40.00 pack-year smoking history. He has never used smokeless tobacco. He reports that he does not drink alcohol and does not use drugs.    ROS Review of Systems  Constitutional: Negative for fever.  Respiratory: Negative for shortness of breath.   Cardiovascular: Negative for chest pain.  Musculoskeletal: Positive for arthralgias and back pain (midback, midline).  Skin: Negative for rash.    Objective:  BP 107/64   Pulse 76   Temp (!) 97.3 F (36.3 C) (Temporal)   BP Readings from Last 3 Encounters:  08/10/20 107/64  02/13/20 96/60  07/23/19 108/75    Wt Readings from Last 3 Encounters:  08/12/18 178 lb 9.2 oz (81 kg)  05/03/18 178 lb 12.8 oz (81.1 kg)  04/08/18 195 lb (88.5 kg)     Physical Exam Vitals reviewed.  Constitutional:      Appearance: He is well-developed and  well-nourished.  HENT:     Head: Normocephalic and atraumatic.     Right Ear: Tympanic membrane and external ear normal. No decreased hearing noted.     Left Ear: Tympanic membrane and external ear normal. No decreased hearing noted.     Mouth/Throat:     Pharynx: No oropharyngeal exudate or posterior oropharyngeal erythema.  Eyes:     Pupils: Pupils are equal, round, and reactive to light.  Cardiovascular:     Rate and Rhythm: Normal rate and regular rhythm.     Heart sounds: No murmur heard.   Pulmonary:     Effort: No respiratory distress.     Comments: Distant breath sounds with decreased expiratory phase, consistent with COPD. Abdominal:     General: Bowel sounds  are normal.     Palpations: Abdomen is soft. There is no mass.     Tenderness: There is no abdominal tenderness.  Musculoskeletal:        General: Tenderness (at T10-12 for percussion of spinous processes) present. No swelling or deformity.     Cervical back: Normal range of motion and neck supple.     Comments: Patient is confined to an electrical wheelchair.  It is a heavy-duty chair that reclines for him as well as propels.  It is a special model provided by the [place] for his situation.  Psychiatric:        Mood and Affect: Mood normal.       Assessment & Plan:   Keeon was seen today for lower mid back pain.  Diagnoses and all orders for this visit:  Acute midline thoracic back pain -     DG Thoracic Spine 2 View; Future  Lewy body dementia with behavioral disturbance (HCC)  Primary parkinsonism (HCC)       I am having Demarea L. Vanvranken maintain his aspirin, Acetaminophen (TYLENOL ARTHRITIS EXT RELIEF PO), Vitamin D, tamsulosin, omeprazole, escitalopram, metFORMIN, rivastigmine, and LORazepam.  Allergies as of 08/10/2020      Reactions   Donepezil Nausea And Vomiting   Namenda [memantine Hcl] Other (See Comments)   Sleeps all the time   Phenergan [promethazine Hcl] Other (See Comments)   Aggressive       Medication List       Accurate as of August 10, 2020 11:59 PM. If you have any questions, ask your nurse or doctor.        aspirin 81 MG tablet Take 81 mg by mouth daily.   escitalopram 10 MG tablet Commonly known as: LEXAPRO TAKE 1 TABLET DAILY)   LORazepam 0.5 MG tablet Commonly known as: ATIVAN Take 0.5 mg by mouth 2 (two) times daily as needed.   metFORMIN 1000 MG tablet Commonly known as: GLUCOPHAGE Take 1 tablet (1,000 mg total) by mouth daily with breakfast.   omeprazole 20 MG capsule Commonly known as: PRILOSEC TAKE 1 CAPSULE DAILY   rivastigmine 4.5 MG capsule Commonly known as: EXELON TAKE 1 CAPSULE TWICE A DAY   tamsulosin 0.4 MG Caps  capsule Commonly known as: FLOMAX Take by mouth.   TYLENOL ARTHRITIS EXT RELIEF PO Take by mouth.   Vitamin D 50 MCG (2000 UT) Caps Take 1 capsule by mouth daily.        Follow-up: Return in about 6 weeks (around 09/21/2020), or if symptoms worsen or fail to improve.  Mechele Claude, M.D.

## 2020-08-12 DIAGNOSIS — E119 Type 2 diabetes mellitus without complications: Secondary | ICD-10-CM | POA: Diagnosis not present

## 2020-08-12 DIAGNOSIS — G309 Alzheimer's disease, unspecified: Secondary | ICD-10-CM | POA: Diagnosis not present

## 2020-08-12 DIAGNOSIS — R270 Ataxia, unspecified: Secondary | ICD-10-CM | POA: Diagnosis not present

## 2020-08-12 DIAGNOSIS — I10 Essential (primary) hypertension: Secondary | ICD-10-CM | POA: Diagnosis not present

## 2020-08-12 DIAGNOSIS — F0280 Dementia in other diseases classified elsewhere without behavioral disturbance: Secondary | ICD-10-CM | POA: Diagnosis not present

## 2020-08-12 DIAGNOSIS — G3183 Dementia with Lewy bodies: Secondary | ICD-10-CM | POA: Diagnosis not present

## 2020-08-13 ENCOUNTER — Encounter: Payer: Self-pay | Admitting: Family Medicine

## 2020-08-13 DIAGNOSIS — G3183 Dementia with Lewy bodies: Secondary | ICD-10-CM | POA: Diagnosis not present

## 2020-08-13 DIAGNOSIS — E559 Vitamin D deficiency, unspecified: Secondary | ICD-10-CM | POA: Diagnosis not present

## 2020-08-13 DIAGNOSIS — Z7982 Long term (current) use of aspirin: Secondary | ICD-10-CM | POA: Diagnosis not present

## 2020-08-13 DIAGNOSIS — E119 Type 2 diabetes mellitus without complications: Secondary | ICD-10-CM | POA: Diagnosis not present

## 2020-08-13 DIAGNOSIS — Z87891 Personal history of nicotine dependence: Secondary | ICD-10-CM | POA: Diagnosis not present

## 2020-08-13 DIAGNOSIS — Z993 Dependence on wheelchair: Secondary | ICD-10-CM | POA: Diagnosis not present

## 2020-08-13 DIAGNOSIS — G309 Alzheimer's disease, unspecified: Secondary | ICD-10-CM | POA: Diagnosis not present

## 2020-08-13 DIAGNOSIS — E785 Hyperlipidemia, unspecified: Secondary | ICD-10-CM | POA: Diagnosis not present

## 2020-08-13 DIAGNOSIS — I10 Essential (primary) hypertension: Secondary | ICD-10-CM | POA: Diagnosis not present

## 2020-08-13 DIAGNOSIS — Z7984 Long term (current) use of oral hypoglycemic drugs: Secondary | ICD-10-CM | POA: Diagnosis not present

## 2020-08-13 DIAGNOSIS — R270 Ataxia, unspecified: Secondary | ICD-10-CM | POA: Diagnosis not present

## 2020-08-13 DIAGNOSIS — I714 Abdominal aortic aneurysm, without rupture: Secondary | ICD-10-CM | POA: Diagnosis not present

## 2020-08-13 DIAGNOSIS — F0280 Dementia in other diseases classified elsewhere without behavioral disturbance: Secondary | ICD-10-CM | POA: Diagnosis not present

## 2020-08-17 DIAGNOSIS — I10 Essential (primary) hypertension: Secondary | ICD-10-CM | POA: Diagnosis not present

## 2020-08-17 DIAGNOSIS — E119 Type 2 diabetes mellitus without complications: Secondary | ICD-10-CM | POA: Diagnosis not present

## 2020-08-17 DIAGNOSIS — G309 Alzheimer's disease, unspecified: Secondary | ICD-10-CM | POA: Diagnosis not present

## 2020-08-17 DIAGNOSIS — G3183 Dementia with Lewy bodies: Secondary | ICD-10-CM | POA: Diagnosis not present

## 2020-08-17 DIAGNOSIS — F0280 Dementia in other diseases classified elsewhere without behavioral disturbance: Secondary | ICD-10-CM | POA: Diagnosis not present

## 2020-08-17 DIAGNOSIS — R270 Ataxia, unspecified: Secondary | ICD-10-CM | POA: Diagnosis not present

## 2020-08-24 DIAGNOSIS — G309 Alzheimer's disease, unspecified: Secondary | ICD-10-CM | POA: Diagnosis not present

## 2020-08-24 DIAGNOSIS — F0280 Dementia in other diseases classified elsewhere without behavioral disturbance: Secondary | ICD-10-CM | POA: Diagnosis not present

## 2020-08-24 DIAGNOSIS — I10 Essential (primary) hypertension: Secondary | ICD-10-CM | POA: Diagnosis not present

## 2020-08-24 DIAGNOSIS — G3183 Dementia with Lewy bodies: Secondary | ICD-10-CM | POA: Diagnosis not present

## 2020-08-24 DIAGNOSIS — R270 Ataxia, unspecified: Secondary | ICD-10-CM | POA: Diagnosis not present

## 2020-08-24 DIAGNOSIS — E119 Type 2 diabetes mellitus without complications: Secondary | ICD-10-CM | POA: Diagnosis not present

## 2020-08-27 ENCOUNTER — Other Ambulatory Visit: Payer: Self-pay | Admitting: Family Medicine

## 2020-08-31 DIAGNOSIS — G3183 Dementia with Lewy bodies: Secondary | ICD-10-CM | POA: Diagnosis not present

## 2020-08-31 DIAGNOSIS — G309 Alzheimer's disease, unspecified: Secondary | ICD-10-CM | POA: Diagnosis not present

## 2020-08-31 DIAGNOSIS — R270 Ataxia, unspecified: Secondary | ICD-10-CM | POA: Diagnosis not present

## 2020-08-31 DIAGNOSIS — I10 Essential (primary) hypertension: Secondary | ICD-10-CM | POA: Diagnosis not present

## 2020-08-31 DIAGNOSIS — E119 Type 2 diabetes mellitus without complications: Secondary | ICD-10-CM | POA: Diagnosis not present

## 2020-08-31 DIAGNOSIS — F0280 Dementia in other diseases classified elsewhere without behavioral disturbance: Secondary | ICD-10-CM | POA: Diagnosis not present

## 2020-09-07 DIAGNOSIS — R270 Ataxia, unspecified: Secondary | ICD-10-CM | POA: Diagnosis not present

## 2020-09-07 DIAGNOSIS — I10 Essential (primary) hypertension: Secondary | ICD-10-CM | POA: Diagnosis not present

## 2020-09-07 DIAGNOSIS — F0280 Dementia in other diseases classified elsewhere without behavioral disturbance: Secondary | ICD-10-CM | POA: Diagnosis not present

## 2020-09-07 DIAGNOSIS — G3183 Dementia with Lewy bodies: Secondary | ICD-10-CM | POA: Diagnosis not present

## 2020-09-07 DIAGNOSIS — E119 Type 2 diabetes mellitus without complications: Secondary | ICD-10-CM | POA: Diagnosis not present

## 2020-09-07 DIAGNOSIS — G309 Alzheimer's disease, unspecified: Secondary | ICD-10-CM | POA: Diagnosis not present

## 2020-09-10 DIAGNOSIS — M79601 Pain in right arm: Secondary | ICD-10-CM | POA: Diagnosis not present

## 2020-09-10 DIAGNOSIS — R5381 Other malaise: Secondary | ICD-10-CM | POA: Diagnosis not present

## 2020-09-10 DIAGNOSIS — G3183 Dementia with Lewy bodies: Secondary | ICD-10-CM | POA: Diagnosis not present

## 2020-09-10 DIAGNOSIS — G20 Parkinson's disease: Secondary | ICD-10-CM | POA: Diagnosis not present

## 2020-09-30 ENCOUNTER — Ambulatory Visit (INDEPENDENT_AMBULATORY_CARE_PROVIDER_SITE_OTHER): Payer: Medicare Other | Admitting: Family Medicine

## 2020-09-30 ENCOUNTER — Other Ambulatory Visit: Payer: Self-pay | Admitting: *Deleted

## 2020-09-30 DIAGNOSIS — R300 Dysuria: Secondary | ICD-10-CM

## 2020-09-30 DIAGNOSIS — F0281 Dementia in other diseases classified elsewhere with behavioral disturbance: Secondary | ICD-10-CM

## 2020-09-30 DIAGNOSIS — R8290 Unspecified abnormal findings in urine: Secondary | ICD-10-CM | POA: Diagnosis not present

## 2020-09-30 DIAGNOSIS — R410 Disorientation, unspecified: Secondary | ICD-10-CM

## 2020-09-30 DIAGNOSIS — F02818 Dementia in other diseases classified elsewhere, unspecified severity, with other behavioral disturbance: Secondary | ICD-10-CM

## 2020-09-30 DIAGNOSIS — G3183 Dementia with Lewy bodies: Secondary | ICD-10-CM | POA: Diagnosis not present

## 2020-09-30 LAB — URINALYSIS, COMPLETE
Bilirubin, UA: NEGATIVE
Glucose, UA: NEGATIVE
Ketones, UA: NEGATIVE
Leukocytes,UA: NEGATIVE
Nitrite, UA: NEGATIVE
Protein,UA: NEGATIVE
RBC, UA: NEGATIVE
Specific Gravity, UA: 1.025 (ref 1.005–1.030)
Urobilinogen, Ur: 2 mg/dL — ABNORMAL HIGH (ref 0.2–1.0)
pH, UA: 6 (ref 5.0–7.5)

## 2020-09-30 LAB — MICROSCOPIC EXAMINATION
Bacteria, UA: NONE SEEN
Epithelial Cells (non renal): NONE SEEN /hpf (ref 0–10)
RBC, Urine: NONE SEEN /hpf (ref 0–2)
WBC, UA: NONE SEEN /hpf (ref 0–5)

## 2020-09-30 MED ORDER — CIPROFLOXACIN HCL 250 MG PO TABS: 250.0000 mg | ORAL_TABLET | Freq: Two times a day (BID) | ORAL | 0 refills | Status: DC

## 2020-09-30 NOTE — Progress Notes (Signed)
Subjective:    Patient ID: Jon [name], male    DOB: 1946/03/01, 75 y.o.   MRN: [id number]   HPI: Jon [name] is a 75 y.o. male presenting for urine smells like ammonia. Acting funny. Repeating himself more than usual. Not himself. Whining, crying. No fever. Vitals are great says his wife. No frequency,     Depression screen [facility] 2/9 08/10/2020 02/13/2020 02/25/2019 02/14/2019 08/23/2018  Decreased Interest 0 0 0 0 0  Down, Depressed, Hopeless 0 0 0 0 0  PHQ - 2 Score 0 0 0 0 0  Altered sleeping - - - - -  Tired, decreased energy - - - - -  Change in appetite - - - - -  Feeling bad or failure about yourself  - - - - -  Trouble concentrating - - - - -  Moving slowly or fidgety/restless - - - - -  Suicidal thoughts - - - - -  PHQ-9 Score - - - - -  Some recent data might be hidden     Relevant past medical, surgical, family and social history reviewed and updated as indicated.  Interim medical history since our last visit reviewed. Allergies and medications reviewed and updated.  ROS:  Review of Systems  Constitutional: Negative for fever.  HENT: Negative.   Genitourinary: Negative for dysuria and frequency.  Musculoskeletal: Positive for arthralgias.  Psychiatric/Behavioral: Positive for confusion. Negative for agitation, sleep disturbance and suicidal ideas. The patient is not nervous/anxious.      Social History   Tobacco Use  Smoking Status Former Smoker  . Packs/day: 1.00  . Years: 40.00  . Pack years: 40.00  . Types: Cigarettes  . Start date: 07/03/1966  . Quit date: 02/06/2009  . Years since quitting: 11.6  Smokeless Tobacco Never Used       Objective:     Wt Readings from Last 3 Encounters:  08/12/18 178 lb 9.2 oz (81 kg)  05/03/18 178 lb 12.8 oz (81.1 kg)  04/08/18 195 lb (88.5 kg)     Exam deferred. Pt. Harboring due to COVID 19. Phone visit performed.   Assessment & Plan:   1. Episodic confusion   2. Lewy body dementia with behavioral  disturbance (HCC)   3. Abnormal urine odor     Meds ordered this encounter  Medications  . ciprofloxacin (CIPRO) 250 MG tablet    Sig: Take 1 tablet (250 mg total) by mouth 2 (two) times daily.    Dispense:  7 tablet    Refill:  0    No orders of the defined types were placed in this encounter.     Diagnoses and all orders for this visit:  Episodic confusion  Lewy body dementia with behavioral disturbance (HCC)  Abnormal urine odor  Other orders -     ciprofloxacin (CIPRO) 250 MG tablet; Take 1 tablet (250 mg total) by mouth 2 (two) times daily.   If urinalysis is normal and symptoms continue will consider increasing the dose of rivastigmine or adding other dementia related medication. Virtual Visit via telephone Note  I discussed the limitations, risks, security and privacy concerns of performing an evaluation and management service by telephone and the availability of in person appointments. The patient was identified with two identifiers. Pt.expressed understanding and agreed to proceed. Pt. Is at home. Dr. Darlyn Read is in his office.  Follow Up Instructions:   I discussed the assessment and treatment plan with the patient. The patient was provided  an opportunity to ask questions and all were answered. The patient agreed with the plan and demonstrated an understanding of the instructions.   The patient was advised to call back or seek an in-person evaluation if the symptoms worsen or if the condition fails to improve as anticipated.   Total minutes including chart review and phone contact time: 22   Follow up plan: Return in about 6 weeks (around 11/11/2020), or if symptoms worsen or fail to improve.  Mechele Claude, MD [place]

## 2020-10-02 LAB — URINE CULTURE

## 2020-10-03 ENCOUNTER — Encounter: Payer: Self-pay | Admitting: Family Medicine

## 2020-10-03 DIAGNOSIS — R410 Disorientation, unspecified: Secondary | ICD-10-CM | POA: Insufficient documentation

## 2020-10-05 ENCOUNTER — Telehealth: Payer: Self-pay

## 2020-10-05 NOTE — Telephone Encounter (Signed)
PATIENTS WIFE AWARE 

## 2020-10-05 NOTE — Telephone Encounter (Signed)
His test came back negative for infection. HE can stop the antibiotic.

## 2020-10-12 DIAGNOSIS — R270 Ataxia, unspecified: Secondary | ICD-10-CM | POA: Diagnosis not present

## 2020-10-12 DIAGNOSIS — F0280 Dementia in other diseases classified elsewhere without behavioral disturbance: Secondary | ICD-10-CM | POA: Diagnosis not present

## 2020-10-12 DIAGNOSIS — Z7984 Long term (current) use of oral hypoglycemic drugs: Secondary | ICD-10-CM | POA: Diagnosis not present

## 2020-10-12 DIAGNOSIS — G309 Alzheimer's disease, unspecified: Secondary | ICD-10-CM | POA: Diagnosis not present

## 2020-10-12 DIAGNOSIS — I714 Abdominal aortic aneurysm, without rupture: Secondary | ICD-10-CM | POA: Diagnosis not present

## 2020-10-12 DIAGNOSIS — Z87891 Personal history of nicotine dependence: Secondary | ICD-10-CM | POA: Diagnosis not present

## 2020-10-12 DIAGNOSIS — G3183 Dementia with Lewy bodies: Secondary | ICD-10-CM | POA: Diagnosis not present

## 2020-10-12 DIAGNOSIS — E785 Hyperlipidemia, unspecified: Secondary | ICD-10-CM | POA: Diagnosis not present

## 2020-10-12 DIAGNOSIS — E559 Vitamin D deficiency, unspecified: Secondary | ICD-10-CM | POA: Diagnosis not present

## 2020-10-12 DIAGNOSIS — Z791 Long term (current) use of non-steroidal anti-inflammatories (NSAID): Secondary | ICD-10-CM | POA: Diagnosis not present

## 2020-10-12 DIAGNOSIS — Z7982 Long term (current) use of aspirin: Secondary | ICD-10-CM | POA: Diagnosis not present

## 2020-10-12 DIAGNOSIS — E119 Type 2 diabetes mellitus without complications: Secondary | ICD-10-CM | POA: Diagnosis not present

## 2020-10-12 DIAGNOSIS — I10 Essential (primary) hypertension: Secondary | ICD-10-CM | POA: Diagnosis not present

## 2020-10-12 DIAGNOSIS — Z993 Dependence on wheelchair: Secondary | ICD-10-CM | POA: Diagnosis not present

## 2020-10-14 DIAGNOSIS — G3183 Dementia with Lewy bodies: Secondary | ICD-10-CM | POA: Diagnosis not present

## 2020-10-14 DIAGNOSIS — G309 Alzheimer's disease, unspecified: Secondary | ICD-10-CM | POA: Diagnosis not present

## 2020-10-14 DIAGNOSIS — I10 Essential (primary) hypertension: Secondary | ICD-10-CM | POA: Diagnosis not present

## 2020-10-14 DIAGNOSIS — F0280 Dementia in other diseases classified elsewhere without behavioral disturbance: Secondary | ICD-10-CM | POA: Diagnosis not present

## 2020-10-14 DIAGNOSIS — R270 Ataxia, unspecified: Secondary | ICD-10-CM | POA: Diagnosis not present

## 2020-10-14 DIAGNOSIS — E119 Type 2 diabetes mellitus without complications: Secondary | ICD-10-CM | POA: Diagnosis not present

## 2020-10-21 DIAGNOSIS — G3183 Dementia with Lewy bodies: Secondary | ICD-10-CM | POA: Diagnosis not present

## 2020-10-21 DIAGNOSIS — F0280 Dementia in other diseases classified elsewhere without behavioral disturbance: Secondary | ICD-10-CM | POA: Diagnosis not present

## 2020-10-21 DIAGNOSIS — G309 Alzheimer's disease, unspecified: Secondary | ICD-10-CM | POA: Diagnosis not present

## 2020-10-21 DIAGNOSIS — R270 Ataxia, unspecified: Secondary | ICD-10-CM | POA: Diagnosis not present

## 2020-10-21 DIAGNOSIS — E119 Type 2 diabetes mellitus without complications: Secondary | ICD-10-CM | POA: Diagnosis not present

## 2020-10-21 DIAGNOSIS — I10 Essential (primary) hypertension: Secondary | ICD-10-CM | POA: Diagnosis not present

## 2020-10-25 ENCOUNTER — Ambulatory Visit (INDEPENDENT_AMBULATORY_CARE_PROVIDER_SITE_OTHER): Payer: Medicare Other

## 2020-10-25 ENCOUNTER — Other Ambulatory Visit: Payer: Self-pay

## 2020-10-25 DIAGNOSIS — Z993 Dependence on wheelchair: Secondary | ICD-10-CM | POA: Diagnosis not present

## 2020-10-25 DIAGNOSIS — F0280 Dementia in other diseases classified elsewhere without behavioral disturbance: Secondary | ICD-10-CM | POA: Diagnosis not present

## 2020-10-25 DIAGNOSIS — R270 Ataxia, unspecified: Secondary | ICD-10-CM

## 2020-10-25 DIAGNOSIS — I714 Abdominal aortic aneurysm, without rupture: Secondary | ICD-10-CM | POA: Diagnosis not present

## 2020-10-25 DIAGNOSIS — Z7982 Long term (current) use of aspirin: Secondary | ICD-10-CM

## 2020-10-25 DIAGNOSIS — I10 Essential (primary) hypertension: Secondary | ICD-10-CM

## 2020-10-25 DIAGNOSIS — G309 Alzheimer's disease, unspecified: Secondary | ICD-10-CM

## 2020-10-25 DIAGNOSIS — Z791 Long term (current) use of non-steroidal anti-inflammatories (NSAID): Secondary | ICD-10-CM

## 2020-10-25 DIAGNOSIS — E785 Hyperlipidemia, unspecified: Secondary | ICD-10-CM | POA: Diagnosis not present

## 2020-10-25 DIAGNOSIS — G3183 Dementia with Lewy bodies: Secondary | ICD-10-CM

## 2020-10-25 DIAGNOSIS — Z7984 Long term (current) use of oral hypoglycemic drugs: Secondary | ICD-10-CM | POA: Diagnosis not present

## 2020-10-25 DIAGNOSIS — E119 Type 2 diabetes mellitus without complications: Secondary | ICD-10-CM

## 2020-10-25 DIAGNOSIS — E559 Vitamin D deficiency, unspecified: Secondary | ICD-10-CM | POA: Diagnosis not present

## 2020-11-02 DIAGNOSIS — G3183 Dementia with Lewy bodies: Secondary | ICD-10-CM | POA: Diagnosis not present

## 2020-11-02 DIAGNOSIS — R270 Ataxia, unspecified: Secondary | ICD-10-CM | POA: Diagnosis not present

## 2020-11-02 DIAGNOSIS — E119 Type 2 diabetes mellitus without complications: Secondary | ICD-10-CM | POA: Diagnosis not present

## 2020-11-02 DIAGNOSIS — F0280 Dementia in other diseases classified elsewhere without behavioral disturbance: Secondary | ICD-10-CM | POA: Diagnosis not present

## 2020-11-02 DIAGNOSIS — G309 Alzheimer's disease, unspecified: Secondary | ICD-10-CM | POA: Diagnosis not present

## 2020-11-02 DIAGNOSIS — I10 Essential (primary) hypertension: Secondary | ICD-10-CM | POA: Diagnosis not present

## 2020-11-10 DIAGNOSIS — R270 Ataxia, unspecified: Secondary | ICD-10-CM | POA: Diagnosis not present

## 2020-11-10 DIAGNOSIS — F0280 Dementia in other diseases classified elsewhere without behavioral disturbance: Secondary | ICD-10-CM | POA: Diagnosis not present

## 2020-11-10 DIAGNOSIS — I10 Essential (primary) hypertension: Secondary | ICD-10-CM | POA: Diagnosis not present

## 2020-11-10 DIAGNOSIS — E119 Type 2 diabetes mellitus without complications: Secondary | ICD-10-CM | POA: Diagnosis not present

## 2020-11-10 DIAGNOSIS — G309 Alzheimer's disease, unspecified: Secondary | ICD-10-CM | POA: Diagnosis not present

## 2020-11-10 DIAGNOSIS — G3183 Dementia with Lewy bodies: Secondary | ICD-10-CM | POA: Diagnosis not present

## 2020-11-11 DIAGNOSIS — G3183 Dementia with Lewy bodies: Secondary | ICD-10-CM | POA: Diagnosis not present

## 2020-11-11 DIAGNOSIS — E119 Type 2 diabetes mellitus without complications: Secondary | ICD-10-CM | POA: Diagnosis not present

## 2020-11-11 DIAGNOSIS — I10 Essential (primary) hypertension: Secondary | ICD-10-CM | POA: Diagnosis not present

## 2020-11-11 DIAGNOSIS — R270 Ataxia, unspecified: Secondary | ICD-10-CM | POA: Diagnosis not present

## 2020-11-11 DIAGNOSIS — E785 Hyperlipidemia, unspecified: Secondary | ICD-10-CM | POA: Diagnosis not present

## 2020-11-11 DIAGNOSIS — I714 Abdominal aortic aneurysm, without rupture: Secondary | ICD-10-CM | POA: Diagnosis not present

## 2020-11-11 DIAGNOSIS — Z993 Dependence on wheelchair: Secondary | ICD-10-CM | POA: Diagnosis not present

## 2020-11-11 DIAGNOSIS — Z87891 Personal history of nicotine dependence: Secondary | ICD-10-CM | POA: Diagnosis not present

## 2020-11-11 DIAGNOSIS — Z7982 Long term (current) use of aspirin: Secondary | ICD-10-CM | POA: Diagnosis not present

## 2020-11-11 DIAGNOSIS — Z7984 Long term (current) use of oral hypoglycemic drugs: Secondary | ICD-10-CM | POA: Diagnosis not present

## 2020-11-11 DIAGNOSIS — F0280 Dementia in other diseases classified elsewhere without behavioral disturbance: Secondary | ICD-10-CM | POA: Diagnosis not present

## 2020-11-11 DIAGNOSIS — G309 Alzheimer's disease, unspecified: Secondary | ICD-10-CM | POA: Diagnosis not present

## 2020-11-11 DIAGNOSIS — Z791 Long term (current) use of non-steroidal anti-inflammatories (NSAID): Secondary | ICD-10-CM | POA: Diagnosis not present

## 2020-11-11 DIAGNOSIS — E559 Vitamin D deficiency, unspecified: Secondary | ICD-10-CM | POA: Diagnosis not present

## 2020-11-19 DIAGNOSIS — G309 Alzheimer's disease, unspecified: Secondary | ICD-10-CM | POA: Diagnosis not present

## 2020-11-19 DIAGNOSIS — G3183 Dementia with Lewy bodies: Secondary | ICD-10-CM | POA: Diagnosis not present

## 2020-11-19 DIAGNOSIS — I10 Essential (primary) hypertension: Secondary | ICD-10-CM | POA: Diagnosis not present

## 2020-11-19 DIAGNOSIS — R270 Ataxia, unspecified: Secondary | ICD-10-CM | POA: Diagnosis not present

## 2020-11-19 DIAGNOSIS — F0280 Dementia in other diseases classified elsewhere without behavioral disturbance: Secondary | ICD-10-CM | POA: Diagnosis not present

## 2020-11-19 DIAGNOSIS — E119 Type 2 diabetes mellitus without complications: Secondary | ICD-10-CM | POA: Diagnosis not present

## 2020-11-23 ENCOUNTER — Ambulatory Visit (INDEPENDENT_AMBULATORY_CARE_PROVIDER_SITE_OTHER): Payer: Medicare Other | Admitting: Family Medicine

## 2020-11-23 ENCOUNTER — Other Ambulatory Visit: Payer: Medicare Other

## 2020-11-23 ENCOUNTER — Encounter: Payer: Self-pay | Admitting: Family Medicine

## 2020-11-23 DIAGNOSIS — Z515 Encounter for palliative care: Secondary | ICD-10-CM

## 2020-11-23 DIAGNOSIS — G3183 Dementia with Lewy bodies: Secondary | ICD-10-CM

## 2020-11-23 DIAGNOSIS — F0281 Dementia in other diseases classified elsewhere with behavioral disturbance: Secondary | ICD-10-CM

## 2020-11-23 DIAGNOSIS — R82998 Other abnormal findings in urine: Secondary | ICD-10-CM

## 2020-11-23 DIAGNOSIS — R400 Somnolence: Secondary | ICD-10-CM

## 2020-11-23 LAB — URINALYSIS
Bilirubin, UA: NEGATIVE
Glucose, UA: NEGATIVE
Ketones, UA: NEGATIVE
Leukocytes,UA: NEGATIVE
Nitrite, UA: NEGATIVE
Protein,UA: NEGATIVE
RBC, UA: NEGATIVE
Specific Gravity, UA: 1.025 (ref 1.005–1.030)
Urobilinogen, Ur: 2 mg/dL — ABNORMAL HIGH (ref 0.2–1.0)
pH, UA: 5.5 (ref 5.0–7.5)

## 2020-11-23 MED ORDER — CIPROFLOXACIN HCL 500 MG PO TABS: 500.0000 mg | ORAL_TABLET | Freq: Two times a day (BID) | ORAL | 0 refills | Status: DC

## 2020-11-23 NOTE — Progress Notes (Signed)
Subjective:    Patient ID: Jon [name], male    DOB: [birth year], 75 y.o.   MRN: [id number]   HPI: Jon [name] is a 75 y.o. male presenting for sleeping a lot. Urine dark. More back pain for the last 4 days. Refusing P.T. Not cooperative with therapist. Not even trying.    [facility]) giving him the lorazepam. Wife has held off on giving it the last few days, but he is still sleeping most of the time.  Depression screen [facility] 2/9 08/10/2020 02/13/2020 02/25/2019 02/14/2019 08/23/2018  Decreased Interest 0 0 0 0 0  Down, Depressed, Hopeless 0 0 0 0 0  PHQ - 2 Score 0 0 0 0 0  Altered sleeping - - - - -  Tired, decreased energy - - - - -  Change in appetite - - - - -  Feeling bad or failure about yourself  - - - - -  Trouble concentrating - - - - -  Moving slowly or fidgety/restless - - - - -  Suicidal thoughts - - - - -  PHQ-9 Score - - - - -  Some recent data might be hidden     Relevant past medical, surgical, family and social history reviewed and updated as indicated.  Interim medical history since our last visit reviewed. Allergies and medications reviewed and updated.  ROS:  Review of Systems  Constitutional: Positive for activity change. Negative for fever.  Respiratory: Negative for shortness of breath.   Cardiovascular: Negative for chest pain.  Musculoskeletal: Positive for back pain.  Skin: Negative for rash.  Psychiatric/Behavioral: Negative for sleep disturbance (sleeping more).     Social History   Tobacco Use  Smoking Status Former Smoker  . Packs/day: 1.00  . Years: 40.00  . Pack years: 40.00  . Types: Cigarettes  . Start date: 07/03/1966  . Quit date: 02/06/2009  . Years since quitting: 11.8  Smokeless Tobacco Never Used       Objective:     Wt Readings from Last 3 Encounters:  08/12/18 178 lb 9.2 oz (81 kg)  05/03/18 178 lb 12.8 oz (81.1 kg)  04/08/18 195 lb (88.5 kg)     Exam deferred. Pt. Harboring due to COVID 19. Phone visit  performed.   Assessment & Plan:   1. Lewy body dementia with behavioral disturbance (HCC)   2. Dark urine   3. Somnolence   4. Hospice care patient       Orders Placed This Encounter  Procedures  . Urine Culture    Wife to collect it and drop it off here  . Urinalysis      Diagnoses and all orders for this visit:  Lewy body dementia with behavioral disturbance (HCC)  Dark urine -     Urinalysis -     Urine Culture  Somnolence -     Urinalysis -     Urine Culture  Hospice care patient  Other orders -     ciprofloxacin (CIPRO) 500 MG tablet; Take 1 tablet (500 mg total) by mouth 2 (two) times daily.  Ms. Bogue does tend to be uncooperative at times historically.  This may represent a worsening of his overall dementia.  It may be just a cycle.  Possibly some infection including urinary tract.  His wife cannot bring in a urine specimen for [place] to process and if it shows any signs of abnormality see if we can get some blood work done by the  Oceanographer.  Once the specimen has been received we will get him on some antibiotics as well.  I  Virtual Visit via telephone Note  I discussed the limitations, risks, security and privacy concerns of performing an evaluation and management service by telephone and the availability of in person appointments. The patient was identified with two identifiers. Pt.expressed understanding and agreed to proceed. Pt. Is at home. Dr. Darlyn Read is in his office.  Follow Up Instructions:   I discussed the assessment and treatment plan with the patient. The patient was provided an opportunity to ask questions and all were answered. The patient agreed with the plan and demonstrated an understanding of the instructions.   The patient was advised to call back or seek an in-person evaluation if the symptoms worsen or if the condition fails to improve as anticipated.   Total minutes including chart review and phone contact time: 21   Follow up  plan: Return if symptoms worsen or fail to improve.  Mechele Claude, MD [place] [facility]

## 2020-11-24 DIAGNOSIS — F0280 Dementia in other diseases classified elsewhere without behavioral disturbance: Secondary | ICD-10-CM | POA: Diagnosis not present

## 2020-11-24 DIAGNOSIS — G3183 Dementia with Lewy bodies: Secondary | ICD-10-CM | POA: Diagnosis not present

## 2020-11-24 DIAGNOSIS — I10 Essential (primary) hypertension: Secondary | ICD-10-CM | POA: Diagnosis not present

## 2020-11-24 DIAGNOSIS — G309 Alzheimer's disease, unspecified: Secondary | ICD-10-CM | POA: Diagnosis not present

## 2020-11-24 DIAGNOSIS — E119 Type 2 diabetes mellitus without complications: Secondary | ICD-10-CM | POA: Diagnosis not present

## 2020-11-24 DIAGNOSIS — R270 Ataxia, unspecified: Secondary | ICD-10-CM | POA: Diagnosis not present

## 2020-11-25 LAB — URINE CULTURE

## 2020-11-28 ENCOUNTER — Encounter: Payer: Self-pay | Admitting: Family Medicine

## 2020-12-01 ENCOUNTER — Other Ambulatory Visit: Payer: Self-pay | Admitting: Family Medicine

## 2020-12-01 DIAGNOSIS — E119 Type 2 diabetes mellitus without complications: Secondary | ICD-10-CM

## 2020-12-03 DIAGNOSIS — R5381 Other malaise: Secondary | ICD-10-CM | POA: Diagnosis not present

## 2020-12-03 DIAGNOSIS — M79601 Pain in right arm: Secondary | ICD-10-CM | POA: Diagnosis not present

## 2020-12-03 DIAGNOSIS — G20 Parkinson's disease: Secondary | ICD-10-CM | POA: Diagnosis not present

## 2020-12-03 DIAGNOSIS — G3183 Dementia with Lewy bodies: Secondary | ICD-10-CM | POA: Diagnosis not present

## 2020-12-08 DIAGNOSIS — R270 Ataxia, unspecified: Secondary | ICD-10-CM | POA: Diagnosis not present

## 2020-12-08 DIAGNOSIS — I10 Essential (primary) hypertension: Secondary | ICD-10-CM | POA: Diagnosis not present

## 2020-12-08 DIAGNOSIS — G3183 Dementia with Lewy bodies: Secondary | ICD-10-CM | POA: Diagnosis not present

## 2020-12-08 DIAGNOSIS — E119 Type 2 diabetes mellitus without complications: Secondary | ICD-10-CM | POA: Diagnosis not present

## 2020-12-08 DIAGNOSIS — G309 Alzheimer's disease, unspecified: Secondary | ICD-10-CM | POA: Diagnosis not present

## 2020-12-08 DIAGNOSIS — F0280 Dementia in other diseases classified elsewhere without behavioral disturbance: Secondary | ICD-10-CM | POA: Diagnosis not present

## 2020-12-24 ENCOUNTER — Ambulatory Visit (INDEPENDENT_AMBULATORY_CARE_PROVIDER_SITE_OTHER): Payer: Medicare Other | Admitting: Family Medicine

## 2020-12-24 DIAGNOSIS — B356 Tinea cruris: Secondary | ICD-10-CM

## 2020-12-24 MED ORDER — CLOTRIMAZOLE-BETAMETHASONE 1-0.05 % EX CREA: 1.0000 "application " | TOPICAL_CREAM | Freq: Two times a day (BID) | CUTANEOUS | 1 refills | Status: DC

## 2020-12-24 NOTE — Progress Notes (Signed)
    Subjective:    Patient ID: Jon [name], male    DOB: [birth year], 75 y.o.   MRN: [id number]   HPI: Jon [name] is a 75 y.o. male presenting for rash on diaper area.   Minerin in use. Not helping. Some discomfort.   Depression screen [facility] 2/9 08/10/2020 02/13/2020 02/25/2019 02/14/2019 08/23/2018  Decreased Interest 0 0 0 0 0  Down, Depressed, Hopeless 0 0 0 0 0  PHQ - 2 Score 0 0 0 0 0  Altered sleeping - - - - -  Tired, decreased energy - - - - -  Change in appetite - - - - -  Feeling bad or failure about yourself  - - - - -  Trouble concentrating - - - - -  Moving slowly or fidgety/restless - - - - -  Suicidal thoughts - - - - -  PHQ-9 Score - - - - -  Some recent data might be hidden     Relevant past medical, surgical, family and social history reviewed and updated as indicated.  Interim medical history since our last visit reviewed. Allergies and medications reviewed and updated.  ROS:  Review of Systems  Constitutional:  Negative for fever. 7   Social History   Tobacco Use  Smoking Status Former   Packs/day: 1.00   Years: 40.00   Pack years: 40.00   Types: Cigarettes   Start date: 07/03/1966   Quit date: 02/06/2009   Years since quitting: 11.8  Smokeless Tobacco Never       Objective:     Wt Readings from Last 3 Encounters:  08/12/18 178 lb 9.2 oz (81 kg)  05/03/18 178 lb 12.8 oz (81.1 kg)  04/08/18 195 lb (88.5 kg)     Exam deferred. Pt. Harboring due to COVID 19. Phone visit performed.   Assessment & Plan:   1. Tinea cruris     Meds ordered this encounter  Medications   clotrimazole-betamethasone (LOTRISONE) cream    Sig: Apply 1 application topically 2 (two) times daily. To affected areas until rash clears    Dispense:  45 g    Refill:  1    No orders of the defined types were placed in this encounter.     Diagnoses and all orders for this visit:  Tinea cruris  Other orders -     clotrimazole-betamethasone (LOTRISONE) cream;  Apply 1 application topically 2 (two) times daily. To affected areas until rash clears   Virtual Visit via telephone Note  I discussed the limitations, risks, security and privacy concerns of performing an evaluation and management service by telephone and the availability of in person appointments. The patient was identified with two identifiers. Pt.expressed understanding and agreed to proceed. Pt. Is at home. Dr. Darlyn Read is in his office.  Follow Up Instructions:   I discussed the assessment and treatment plan with the patient. The patient was provided an opportunity to ask questions and all were answered. The patient agreed with the plan and demonstrated an understanding of the instructions.   The patient was advised to call back or seek an in-person evaluation if the symptoms worsen or if the condition fails to improve as anticipated.   Total minutes including chart review and phone contact time: 7   Follow up plan: No follow-ups on file.  Mechele Claude, MD [facility]

## 2020-12-27 ENCOUNTER — Encounter: Payer: Self-pay | Admitting: Family Medicine

## 2021-01-27 DIAGNOSIS — F0390 Unspecified dementia without behavioral disturbance: Secondary | ICD-10-CM | POA: Diagnosis not present

## 2021-02-18 ENCOUNTER — Telehealth: Payer: Self-pay | Admitting: Family Medicine

## 2021-02-18 NOTE — Telephone Encounter (Signed)
Wife states neurologist changed his Lexapro to Zoloft and wants to know if that will be okay with his Lorazepam.

## 2021-02-18 NOTE — Telephone Encounter (Signed)
Patients wife aware

## 2021-02-18 NOTE — Telephone Encounter (Signed)
Pt's wife has questions about his medications. She said he was prescribed a medicine by another provider and she wants to make sure they do not interfere with anything that he is taking per [facility]. Please call back and advise.

## 2021-02-18 NOTE — Telephone Encounter (Signed)
Yes, the two are okay. May sedate him a little extra is all.

## 2021-02-18 NOTE — Telephone Encounter (Signed)
Wife really needs to know before the weekend if it is ok for pt to take Zoloft with his Lorazepam.  Please advise and call wife [name]) [phone]

## 2021-02-21 ENCOUNTER — Ambulatory Visit (INDEPENDENT_AMBULATORY_CARE_PROVIDER_SITE_OTHER): Payer: Medicare Other

## 2021-02-21 VITALS — Ht 70.0 in | Wt 130.0 lb

## 2021-02-21 DIAGNOSIS — Z0000 Encounter for general adult medical examination without abnormal findings: Secondary | ICD-10-CM | POA: Diagnosis not present

## 2021-02-21 DIAGNOSIS — Z7409 Other reduced mobility: Secondary | ICD-10-CM | POA: Insufficient documentation

## 2021-02-21 DIAGNOSIS — G20 Parkinson's disease: Secondary | ICD-10-CM | POA: Diagnosis not present

## 2021-02-21 DIAGNOSIS — G3183 Dementia with Lewy bodies: Secondary | ICD-10-CM | POA: Diagnosis not present

## 2021-02-21 DIAGNOSIS — M79601 Pain in right arm: Secondary | ICD-10-CM | POA: Diagnosis not present

## 2021-02-21 DIAGNOSIS — R5381 Other malaise: Secondary | ICD-10-CM | POA: Diagnosis not present

## 2021-02-21 NOTE — Progress Notes (Signed)
Subjective:   Jon [name] is a 75 y.o. male who presents for Medicare Annual/Subsequent preventive examination.  Virtual Visit via Telephone Note  I connected with  Jon [name] on 02/21/21 at  1:15 PM EDT by telephone and verified that I am speaking with the correct person using two identifiers.  Location: Patient: Home Provider: [facility] Persons participating in the virtual visit: patient/Nurse Health Advisor   I discussed the limitations, risks, security and privacy concerns of performing an evaluation and management service by telephone and the availability of in person appointments. The patient expressed understanding and agreed to proceed.  Interactive audio and video telecommunications were attempted between this nurse and patient, however failed, due to patient having technical difficulties OR patient did not have access to video capability.  We continued and completed visit with audio only.  Some vital signs may be absent or patient reported.   Jon Fullbright E Shanoah Asbill, LPN   Review of Systems     Cardiac Risk Factors include: advanced age (>87men, >53 women);diabetes mellitus;male gender;sedentary lifestyle;dyslipidemia     Objective:    Today's Vitals   02/21/21 1609  Weight: 130 lb (59 kg)  Height: 5\' 10"  (1.778 m)   Body mass index is 18.65 kg/m.  Advanced Directives 02/21/2021 02/18/2020 02/14/2019 08/13/2018 08/12/2018 01/01/2018 04/29/2015  Does Patient Have a Medical Advance Directive? Yes Yes Yes Yes Yes Yes Yes  Type of 05/01/2015 of [place];Living will Healthcare Power of [place];Living will Living will;Healthcare Power of Attorney Living will Healthcare Power of [place];Living will - Healthcare Power of [place];Living will  Does patient want to make changes to medical advance directive? - No - Patient declined No - Patient declined No - Patient declined - - No - Patient declined  Copy of Healthcare Power of Attorney in Chart? No - copy  requested No - copy requested No - copy requested - - - No - copy requested  Would patient like information on creating a medical advance directive? - - - - - - -    Current Medications (verified) Outpatient Encounter Medications as of 02/21/2021  Medication Sig   Acetaminophen (TYLENOL ARTHRITIS EXT RELIEF PO) Take by mouth.   aspirin 81 MG tablet Take 81 mg by mouth daily.   Cholecalciferol (VITAMIN D) 50 MCG (2000 UT) CAPS Take 1 capsule by mouth daily.   clotrimazole-betamethasone (LOTRISONE) cream Apply 1 application topically 2 (two) times daily. To affected areas until rash clears   escitalopram (LEXAPRO) 10 MG tablet TAKE 1 TABLET DAILY)   LORazepam (ATIVAN) 0.5 MG tablet Take 0.5 mg by mouth 2 (two) times daily as needed.   metFORMIN (GLUCOPHAGE) 1000 MG tablet TAKE 1 TABLET DAILY WITH BREAKFAST   omeprazole (PRILOSEC) 20 MG capsule TAKE 1 CAPSULE DAILY   rivastigmine (EXELON) 4.5 MG capsule TAKE 1 CAPSULE TWICE A DAY   sertraline (ZOLOFT) 25 MG tablet Take 25 mg by mouth daily.   tamsulosin (FLOMAX) 0.4 MG CAPS capsule Take by mouth.   ciprofloxacin (CIPRO) 500 MG tablet Take 1 tablet (500 mg total) by mouth 2 (two) times daily. (Patient not taking: Reported on 02/21/2021)   No facility-administered encounter medications on file as of 02/21/2021.    Allergies (verified) Donepezil, Namenda [memantine hcl], and Phenergan [promethazine hcl]   History: Past Medical History:  Diagnosis Date   AAA (abdominal aortic aneurysm) (HCC)    Alzheimer disease (HCC)    Anemia    Ataxia    Diabetes mellitus without complication (HCC)  History of gallstones    Hyperlipidemia    Hypertension    Vitamin D deficiency    Past Surgical History:  Procedure Laterality Date   ERCP     INGUINAL HERNIA REPAIR     TONSILLECTOMY     Family History  Problem Relation Age of Onset   Diabetes Mother    Hip fracture Mother    Pulmonary embolism Mother        after hip fracture   Lupus  Sister    Scleroderma Sister    Diabetes Brother    Hyperlipidemia Brother    Hypertension Brother    Social History   Socioeconomic History   Marital status: Married    Spouse name: [name]   Number of children: 1   Years of education: 12   Highest education level: High school graduate  Occupational History   Occupation: retired  Tobacco Use   Smoking status: Former    Packs/day: 1.00    Years: 40.00    Pack years: 40.00    Types: Cigarettes    Start date: 07/03/1966    Quit date: 02/06/2009    Years since quitting: 12.0   Smokeless tobacco: Never  Vaping Use   Vaping Use: Never used  Substance and Sexual Activity   Alcohol use: No    Alcohol/week: 0.0 standard drinks   Drug use: No   Sexual activity: Not Currently  Other Topics Concern   Not on file  Social History Narrative   Completely dependent patient - lives home with his wife - bedridden - gets VA benefits   Has a CNA 4 days per week; wife and daughter provide care the rest of the time.   Social Determinants of Health   Financial Resource Strain: Low Risk    Difficulty of Paying Living Expenses: Not hard at all  Food Insecurity: No Food Insecurity   Worried About Programme researcher, broadcasting/film/video in the Last Year: Never true   Ran Out of Food in the Last Year: Never true  Transportation Needs: No Transportation Needs   Lack of Transportation (Medical): No   Lack of Transportation (Non-Medical): No  Physical Activity: Inactive   Days of Exercise per Week: 0 days   Minutes of Exercise per Session: 0 min  Stress: No Stress Concern Present   Feeling of Stress : Only a little  Social Connections: Moderately Isolated   Frequency of Communication with Friends and Family: Never   Frequency of Social Gatherings with Friends and Family: More than three times a week   Attends Religious Services: Never   Database administrator or Organizations: No   Attends Engineer, structural: Never   Marital Status: Married     Tobacco Counseling Counseling given: Not Answered   Clinical Intake:  Pre-visit preparation completed: Yes  Pain : No/denies pain     BMI - recorded: 18.65 Nutritional Status: BMI <19  Underweight Nutritional Risks: Unintentional weight loss Diabetes: Yes CBG done?: No Did pt. bring in CBG monitor from home?: No  How often do you need to have someone help you when you read instructions, pamphlets, or other written materials from your doctor or pharmacy?: 5 - Always  Diabetic? Nutrition Risk Assessment:  Has the patient had any N/V/D within the last 2 months?  No  Does the patient have any non-healing wounds?  No  Has the patient had any unintentional weight loss or weight gain?  Yes   Diabetes:  Is the patient diabetic?  Yes  If diabetic, was a CBG obtained today?  No  Did the patient bring in their glucometer from home?  No  How often do you monitor your CBG's? Once every week or 2.   Financial Strains and Diabetes Management:  Are you having any financial strains with the device, your supplies or your medication? No .  Does the patient want to be seen by Chronic Care Management for management of their diabetes?  No  Would the patient like to be referred to a Nutritionist or for Diabetic Management?  No   Diabetic Exams:  Diabetic Eye Exam: Completed 2016. Overdue for diabetic eye exam. Pt has been advised about the importance in completing this exam. This patient is exempt   Diabetic Foot Exam: Completed 2019. Pt has been advised about the importance in completing this exam. Pt is scheduled for diabetic foot exam on when able to come into office for face to face visit.    Interpreter Needed?: Yes  Comments: Wife speaks on behalf of patient Information entered by :: Zian Delair, LPN   Activities of Daily Living In your present state of health, do you have any difficulty performing the following activities: 02/21/2021  Hearing? N  Vision? N  Difficulty  concentrating or making decisions? Y  Walking or climbing stairs? Y  Dressing or bathing? Y  Doing errands, shopping? Y  Preparing Food and eating ? Y  Using the Toilet? Y  In the past six months, have you accidently leaked urine? Y  Do you have problems with loss of bowel control? Y  Managing your Medications? Y  Managing your Finances? Y  Housekeeping or managing your Housekeeping? Y  Some recent data might be hidden    Patient Care Team: Mechele Claude, MD as PCP - General (Family Medicine) Vallery Ridge, MD as Consulting Physician (Neurology) Everlena Cooper as Consulting Physician (Neurology)  Indicate any recent Medical Services you may have received from other than [facility] providers in the past year (date may be approximate).     Assessment:   This is a routine wellness examination for [place].  Hearing/Vision screen Hearing Screening - Comments:: No known hearing difficulties Vision Screening - Comments:: No known vision difficulties   Dietary issues and exercise activities discussed: Current Exercise Habits: The patient does not participate in regular exercise at present, Exercise limited by: neurologic condition(s);psychological condition(s);cardiac condition(s)   Goals Addressed             This Visit's Progress    Palliative Care Services   On track    Palliative care services and in home care needed in patient with Parkinson's and Lewy Body Dementia  Current Barriers:  Knowledge Deficits related to palliative/supportive care services  Nurse Case Manager Clinical Goal(s):  Over the next 7 days, patient will have in home assessment by palliative care services  Interventions:  Talked with wife by phone Chart reviewed Discussed palliative care services Discussed [facility] services and in home-aides Questions answered Encouraged wife to reach out to [facility] team as needed  Patient Self Care Activities:  Wife assists or performs ADLs/IADLs due to parkinson's and  dementia   Initial goal documentation        Depression Screen PHQ 2/9 Scores 02/21/2021 08/10/2020 02/13/2020 02/25/2019 02/14/2019 08/23/2018 05/03/2018  PHQ - 2 Score 0 0 0 0 0 0 0  PHQ- 9 Score - - - - - - -    Fall Risk Fall Risk  02/21/2021 08/10/2020 02/18/2020 02/13/2020 02/25/2019  Falls  in the past year? Exclusion - non ambulatory - 0 0 0  Number falls in past yr: 0 - - 0 -  Injury with Fall? 0 - - 0 -  Risk Factor Category  - - - - -  Risk for fall due to : Impaired mobility Impaired mobility - Impaired mobility -  Follow up Education provided;Falls prevention discussed - - Falls evaluation completed -    FALL RISK PREVENTION PERTAINING TO THE HOME:  Any stairs in or around the home? No  If so, are there any without handrails? No  Home free of loose throw rugs in walkways, pet beds, electrical cords, etc? Yes  Adequate lighting in your home to reduce risk of falls? Yes   ASSISTIVE DEVICES UTILIZED TO PREVENT FALLS:  Life alert? Yes  Use of a cane, walker or w/c? Yes  Grab bars in the bathroom? Yes  Shower chair or bench in shower? Yes  Elevated toilet seat or a handicapped toilet? Yes   TIMED UP AND GO:  Was the test performed? No . Non-ambulatory (plus telephonic visit)  Cognitive Function: Cognitive status assessed by direct observation. Patient has current diagnosis of cognitive impairment. Patient is followed by neurology for ongoing assessment. Patient is unable to complete screening 6CIT or MMSE.   MMSE - Mini Mental State Exam 02/14/2019 04/29/2015  Not completed: Unable to complete -  Orientation to time - 5  Orientation to Place - 5  Registration - 3  Attention/ Calculation - 4  Recall - 3  Language- name 2 objects - 2  Language- repeat - 1  Language- follow 3 step command - 3  Language- read & follow direction - 1  Write a sentence - 1  Copy design - 0  Total score - 28        Immunizations  There is no immunization history on file for this  patient.  TDAP status: Due, Education has been provided regarding the importance of this vaccine. Advised may receive this vaccine at local pharmacy or Health Dept. Aware to provide a copy of the vaccination record if obtained from local pharmacy or Health Dept. Verbalized acceptance and understanding.  Flu Vaccine status: Declined, Education has been provided regarding the importance of this vaccine but patient still declined. Advised may receive this vaccine at local pharmacy or Health Dept. Aware to provide a copy of the vaccination record if obtained from local pharmacy or Health Dept. Verbalized acceptance and understanding.  Pneumococcal vaccine status: Declined,  Education has been provided regarding the importance of this vaccine but patient still declined. Advised may receive this vaccine at local pharmacy or Health Dept. Aware to provide a copy of the vaccination record if obtained from local pharmacy or Health Dept. Verbalized acceptance and understanding.   Covid-19 vaccine status: Declined, Education has been provided regarding the importance of this vaccine but patient still declined. Advised may receive this vaccine at local pharmacy or Health Dept.or vaccine clinic. Aware to provide a copy of the vaccination record if obtained from local pharmacy or Health Dept. Verbalized acceptance and understanding.  Qualifies for Shingles Vaccine? Yes   Zostavax completed No   Shingrix Completed?: No.    Education has been provided regarding the importance of this vaccine. Patient has been advised to call insurance company to determine out of pocket expense if they have not yet received this vaccine. Advised may also receive vaccine at local pharmacy or Health Dept. Verbalized acceptance and understanding.  Screening Tests Health Maintenance  Topic Date Due   COVID-19 Vaccine (1) Never done   TETANUS/TDAP  Never done   Zoster Vaccines- Shingrix (1 of 2) Never done   COLONOSCOPY (Pts [id number]  Insurance coverage will need to be confirmed)  Never done   PNA vac Low Risk Adult (1 of 2 - PCV13) Never done   OPHTHALMOLOGY EXAM  01/15/2016   FOOT EXAM  09/08/2018   URINE MICROALBUMIN  12/25/2018   HEMOGLOBIN A1C  12/15/2020   INFLUENZA VACCINE  01/31/2021   Hepatitis C Screening  Completed   HPV VACCINES  Aged Out    Health Maintenance  Health Maintenance Due  Topic Date Due   COVID-19 Vaccine (1) Never done   TETANUS/TDAP  Never done   Zoster Vaccines- Shingrix (1 of 2) Never done   COLONOSCOPY (Pts [phone] Insurance coverage will need to be confirmed)  Never done   PNA vac Low Risk Adult (1 of 2 - PCV13) Never done   OPHTHALMOLOGY EXAM  01/15/2016   FOOT EXAM  09/08/2018   URINE MICROALBUMIN  12/25/2018   HEMOGLOBIN A1C  12/15/2020   INFLUENZA VACCINE  01/31/2021    Colorectal cancer screening: No longer required.   Lung Cancer Screening: (Low Dose CT Chest recommended if Age 63-80 years, 30 pack-year currently smoking OR have quit w/in 15years.) does not qualify.   Additional Screening:  Hepatitis C Screening: does qualify; Completed 08/15/2018  Vision Screening: Recommended annual ophthalmology exams for early detection of glaucoma and other disorders of the eye. Is the patient up to date with their annual eye exam?  No  Who is the provider or what is the name of the office in which the patient attends annual eye exams? none If pt is not established with a provider, would they like to be referred to a provider to establish care? No .   Dental Screening: Recommended annual dental exams for proper oral hygiene  Community Resource Referral / Chronic Care Management: CRR required this visit?  No   CCM required this visit?  No      Plan:     I have personally reviewed and noted the following in the patient's chart:   Medical and social history Use of alcohol, tobacco or illicit drugs  Current medications and supplements including opioid prescriptions.  Patient is not currently taking opioid prescriptions. Functional ability and status Nutritional status Physical activity Advanced directives List of other physicians Hospitalizations, surgeries, and ER visits in previous 12 months Vitals Screenings to include cognitive, depression, and falls Referrals and appointments  In addition, I have reviewed and discussed with patient certain preventive protocols, quality metrics, and best practice recommendations. A written personalized care plan for preventive services as well as general preventive health recommendations were provided to patient.     [place] Constablemy E Olie Scaffidi, LPN   1/19/14788/22/2022   Nurse Notes: None

## 2021-02-21 NOTE — Patient Instructions (Addendum)
[name] , Thank you for taking time to come for your Medicare Wellness Visit. I appreciate your ongoing commitment to your health goals. Please review the following plan we discussed and let me know if I can assist you in the future.   Screening recommendations/referrals: Colonoscopy: Not required Recommended yearly ophthalmology/optometry visit for glaucoma screening and checkup Recommended yearly dental visit for hygiene and checkup  Vaccinations: Influenza vaccine: declined Pneumococcal vaccine: declined Tdap vaccine: declined Shingles vaccine: declined   Covid-19: declined  Advanced directives: Please bring a copy of your health care power of attorney and living will to the office to be added to your chart at your convenience.   Conditions/risks identified: continue to monitor for bed sores, encourage healthier foods and water, try ensure.  Next appointment: Follow up in one year for your annual wellness visit.   Preventive Care 39 Years and Older, Male  Preventive care refers to lifestyle choices and visits with your health care provider that can promote health and wellness. What does preventive care include? A yearly physical exam. This is also called an annual well check. Dental exams once or twice a year. Routine eye exams. Ask your health care provider how often you should have your eyes checked. Personal lifestyle choices, including: Daily care of your teeth and gums. Regular physical activity. Eating a healthy diet. Avoiding tobacco and drug use. Limiting alcohol use. Practicing safe sex. Taking low doses of aspirin every day. Taking vitamin and mineral supplements as recommended by your health care provider. What happens during an annual well check? The services and screenings done by your health care provider during your annual well check will depend on your age, overall health, lifestyle risk factors, and family history of disease. Counseling  Your health care  provider may ask you questions about your: Alcohol use. Tobacco use. Drug use. Emotional well-being. Home and relationship well-being. Sexual activity. Eating habits. History of falls. Memory and ability to understand (cognition). Work and work Astronomer. Screening  You may have the following tests or measurements: Height, weight, and BMI. Blood pressure. Lipid and cholesterol levels. These may be checked every 5 years, or more frequently if you are over 98 years old. Skin check. Lung cancer screening. You may have this screening every year starting at age 10 if you have a 30-pack-year history of smoking and currently smoke or have quit within the past 15 years. Fecal occult blood test (FOBT) of the stool. You may have this test every year starting at age 97. Flexible sigmoidoscopy or colonoscopy. You may have a sigmoidoscopy every 5 years or a colonoscopy every 10 years starting at age 10. Prostate cancer screening. Recommendations will vary depending on your family history and other risks. Hepatitis C blood test. Hepatitis B blood test. Sexually transmitted disease (STD) testing. Diabetes screening. This is done by checking your blood sugar (glucose) after you have not eaten for a while (fasting). You may have this done every 1-3 years. Abdominal aortic aneurysm (AAA) screening. You may need this if you are a current or former smoker. Osteoporosis. You may be screened starting at age 30 if you are at high risk. Talk with your health care provider about your test results, treatment options, and if necessary, the need for more tests. Vaccines  Your health care provider may recommend certain vaccines, such as: Influenza vaccine. This is recommended every year. Tetanus, diphtheria, and acellular pertussis (Tdap, Td) vaccine. You may need a Td booster every 10 years. Zoster vaccine. You may  need this after age 38. Pneumococcal 13-valent conjugate (PCV13) vaccine. One dose is  recommended after age 8. Pneumococcal polysaccharide (PPSV23) vaccine. One dose is recommended after age 24. Talk to your health care provider about which screenings and vaccines you need and how often you need them. This information is not intended to replace advice given to you by your health care provider. Make sure you discuss any questions you have with your health care provider. Document Released: 07/16/2015 Document Revised: 03/08/2016 Document Reviewed: 04/20/2015 Elsevier Interactive Patient Education  2017 [employer] Prevention in the Home Falls can cause injuries. They can happen to people of all ages. There are many things you can do to make your home safe and to help prevent falls. What can I do on the outside of my home? Regularly fix the edges of walkways and driveways and fix any cracks. Remove anything that might make you trip as you walk through a door, such as a raised step or threshold. Trim any bushes or trees on the path to your home. Use bright outdoor lighting. Clear any walking paths of anything that might make someone trip, such as rocks or tools. Regularly check to see if handrails are loose or broken. Make sure that both sides of any steps have handrails. Any raised decks and porches should have guardrails on the edges. Have any leaves, snow, or ice cleared regularly. Use sand or salt on walking paths during winter. Clean up any spills in your garage right away. This includes oil or grease spills. What can I do in the bathroom? Use night lights. Install grab bars by the toilet and in the tub and shower. Do not use towel bars as grab bars. Use non-skid mats or decals in the tub or shower. If you need to sit down in the shower, use a plastic, non-slip stool. Keep the floor dry. Clean up any water that spills on the floor as soon as it happens. Remove soap buildup in the tub or shower regularly. Attach bath mats securely with double-sided non-slip rug  tape. Do not have throw rugs and other things on the floor that can make you trip. What can I do in the bedroom? Use night lights. Make sure that you have a light by your bed that is easy to reach. Do not use any sheets or blankets that are too big for your bed. They should not hang down onto the floor. Have a firm chair that has side arms. You can use this for support while you get dressed. Do not have throw rugs and other things on the floor that can make you trip. What can I do in the kitchen? Clean up any spills right away. Avoid walking on wet floors. Keep items that you use a lot in easy-to-reach places. If you need to reach something above you, use a strong step stool that has a grab bar. Keep electrical cords out of the way. Do not use floor polish or wax that makes floors slippery. If you must use wax, use non-skid floor wax. Do not have throw rugs and other things on the floor that can make you trip. What can I do with my stairs? Do not leave any items on the stairs. Make sure that there are handrails on both sides of the stairs and use them. Fix handrails that are broken or loose. Make sure that handrails are as long as the stairways. Check any carpeting to make sure that it is firmly attached  to the stairs. Fix any carpet that is loose or worn. Avoid having throw rugs at the top or bottom of the stairs. If you do have throw rugs, attach them to the floor with carpet tape. Make sure that you have a light switch at the top of the stairs and the bottom of the stairs. If you do not have them, ask someone to add them for you. What else can I do to help prevent falls? Wear shoes that: Do not have high heels. Have rubber bottoms. Are comfortable and fit you well. Are closed at the toe. Do not wear sandals. If you use a stepladder: Make sure that it is fully opened. Do not climb a closed stepladder. Make sure that both sides of the stepladder are locked into place. Ask someone to  hold it for you, if possible. Clearly mark and make sure that you can see: Any grab bars or handrails. First and last steps. Where the edge of each step is. Use tools that help you move around (mobility aids) if they are needed. These include: Canes. Walkers. Scooters. Crutches. Turn on the lights when you go into a dark area. Replace any light bulbs as soon as they burn out. Set up your furniture so you have a clear path. Avoid moving your furniture around. If any of your floors are uneven, fix them. If there are any pets around you, be aware of where they are. Review your medicines with your doctor. Some medicines can make you feel dizzy. This can increase your chance of falling. Ask your doctor what other things that you can do to help prevent falls. This information is not intended to replace advice given to you by your health care provider. Make sure you discuss any questions you have with your health care provider. Document Released: 04/15/2009 Document Revised: 11/25/2015 Document Reviewed: 07/24/2014 Elsevier Interactive Patient Education  2017 [employer].

## 2021-02-22 ENCOUNTER — Telehealth: Payer: Self-pay | Admitting: Family Medicine

## 2021-02-22 ENCOUNTER — Ambulatory Visit (INDEPENDENT_AMBULATORY_CARE_PROVIDER_SITE_OTHER): Payer: Medicare Other | Admitting: Family Medicine

## 2021-02-22 ENCOUNTER — Encounter: Payer: Self-pay | Admitting: Family Medicine

## 2021-02-22 DIAGNOSIS — R300 Dysuria: Secondary | ICD-10-CM | POA: Diagnosis not present

## 2021-02-22 MED ORDER — CIPROFLOXACIN HCL 500 MG PO TABS: 500.0000 mg | ORAL_TABLET | Freq: Two times a day (BID) | ORAL | 0 refills | Status: DC

## 2021-02-22 NOTE — Progress Notes (Signed)
Subjective:    Patient ID: Jon [name], male    DOB: December 26, 1945, 75 y.o.   MRN: [id number]   HPI: Jon [name] is a 75 y.o. male presenting for urine odor is strong. Color is yellowy red. No Azo given. Pt. Bedridden. Has dementia. Doesn't report symptoms. Seems okay. Is losing weight. Doesn't eat well. Will only eat sandwiches hot dogs and pizza.    Depression screen [facility] 2/9 02/21/2021 08/10/2020 02/13/2020 02/25/2019 02/14/2019  Decreased Interest 0 0 0 0 0  Down, Depressed, Hopeless 0 0 0 0 0  PHQ - 2 Score 0 0 0 0 0  Altered sleeping - - - - -  Tired, decreased energy - - - - -  Change in appetite - - - - -  Feeling bad or failure about yourself  - - - - -  Trouble concentrating - - - - -  Moving slowly or fidgety/restless - - - - -  Suicidal thoughts - - - - -  PHQ-9 Score - - - - -  Some recent data might be hidden     Relevant past medical, surgical, family and social history reviewed and updated as indicated.  Interim medical history since our last visit reviewed. Allergies and medications reviewed and updated.  ROS:  Review of Systems  Constitutional:  Positive for appetite change (continues to decline). Negative for activity change and fever.  Respiratory:  Negative for shortness of breath.   Cardiovascular:  Negative for chest pain.  Gastrointestinal:  Negative for abdominal pain.  Psychiatric/Behavioral:  Positive for confusion.     Social History   Tobacco Use  Smoking Status Former   Packs/day: 1.00   Years: 40.00   Pack years: 40.00   Types: Cigarettes   Start date: 07/03/1966   Quit date: 02/06/2009   Years since quitting: 12.0  Smokeless Tobacco Never       Objective:     Wt Readings from Last 3 Encounters:  02/21/21 130 lb (59 kg)  08/12/18 178 lb 9.2 oz (81 kg)  05/03/18 178 lb 12.8 oz (81.1 kg)     Exam deferred. Pt. Harboring due to COVID 19. Phone visit performed.   Assessment & Plan:   1. Dysuria     Meds ordered this encounter   Medications   ciprofloxacin (CIPRO) 500 MG tablet    Sig: Take 1 tablet (500 mg total) by mouth 2 (two) times daily.    Dispense:  20 tablet    Refill:  0    Orders Placed This Encounter  Procedures   Urine Culture   Urinalysis       Diagnoses and all orders for this visit:  Dysuria -     Urinalysis -     Urine Culture  Other orders -     ciprofloxacin (CIPRO) 500 MG tablet; Take 1 tablet (500 mg total) by mouth 2 (two) times daily.   Virtual Visit via telephone Note  I discussed the limitations, risks, security and privacy concerns of performing an evaluation and management service by telephone and the availability of in person appointments. The patient was identified with two identifiers. Pt.expressed understanding and agreed to proceed. Pt. Is at home. Dr. Darlyn Read is in his office.  Follow Up Instructions:   I discussed the assessment and treatment plan with the patient. The patient was provided an opportunity to ask questions and all were answered. The patient agreed with the plan and demonstrated an understanding of the instructions.  The patient was advised to call back or seek an in-person evaluation if the symptoms worsen or if the condition fails to improve as anticipated.   Total minutes including chart review and phone contact time: 8   Follow up plan: No follow-ups on file.  Mechele Claude, MD [place] [facility]

## 2021-02-23 ENCOUNTER — Other Ambulatory Visit: Payer: Medicare Other

## 2021-02-23 ENCOUNTER — Other Ambulatory Visit: Payer: Self-pay

## 2021-02-23 DIAGNOSIS — R300 Dysuria: Secondary | ICD-10-CM | POA: Diagnosis not present

## 2021-02-23 LAB — URINALYSIS
Bilirubin, UA: NEGATIVE
Ketones, UA: NEGATIVE
Leukocytes,UA: NEGATIVE
Nitrite, UA: NEGATIVE
Protein,UA: NEGATIVE
RBC, UA: NEGATIVE
Specific Gravity, UA: 1.025 (ref 1.005–1.030)
Urobilinogen, Ur: 4 mg/dL — ABNORMAL HIGH (ref 0.2–1.0)
pH, UA: 5.5 (ref 5.0–7.5)

## 2021-02-24 ENCOUNTER — Telehealth: Payer: Self-pay | Admitting: Family Medicine

## 2021-02-24 NOTE — Telephone Encounter (Signed)
Only based on the urinalysis, it does not look like an infection. Culture pending.

## 2021-02-24 NOTE — Telephone Encounter (Signed)
Wife aware and verbalizes understanding.  

## 2021-02-24 NOTE — Telephone Encounter (Signed)
Wanting UA results- cipro was sent on 8/23 but it was not started.  Wanted to wait until UA came back. Aware that culture is not back yet. Wants to know what UA shows and if patient needs to start abx? Covering PCP- please advise

## 2021-02-25 ENCOUNTER — Other Ambulatory Visit: Payer: Self-pay | Admitting: Family Medicine

## 2021-02-25 LAB — URINE CULTURE

## 2021-02-25 MED ORDER — AMOXICILLIN 500 MG PO CAPS: 500.0000 mg | ORAL_CAPSULE | Freq: Three times a day (TID) | ORAL | 0 refills | Status: DC

## 2021-02-25 NOTE — Progress Notes (Signed)
Your urine culture shows the presence of a germ that is resistant to the current antibiotic you are taking. Please discontinue that medication and take the new one I have sent to your pharmacy.  Best Regards, Anise Harbin, M.D.  

## 2021-03-01 ENCOUNTER — Other Ambulatory Visit: Payer: Self-pay | Admitting: Family Medicine

## 2021-03-01 DIAGNOSIS — E119 Type 2 diabetes mellitus without complications: Secondary | ICD-10-CM

## 2021-03-01 DIAGNOSIS — Z794 Long term (current) use of insulin: Secondary | ICD-10-CM

## 2021-03-01 NOTE — Telephone Encounter (Signed)
Stacks. NTBS last A1C Dec 2021 mail order not sent

## 2021-03-02 ENCOUNTER — Other Ambulatory Visit: Payer: Self-pay | Admitting: Family Medicine

## 2021-03-02 ENCOUNTER — Telehealth: Payer: Self-pay | Admitting: Family Medicine

## 2021-03-02 MED ORDER — CLOTRIMAZOLE-BETAMETHASONE 1-0.05 % EX CREA: 1.0000 "application " | TOPICAL_CREAM | Freq: Two times a day (BID) | CUTANEOUS | 1 refills | Status: AC

## 2021-03-02 NOTE — Telephone Encounter (Signed)
Please let the patient know that I sent their prescription to their pharmacy. Thanks, WS 

## 2021-03-02 NOTE — Telephone Encounter (Signed)
Patient has noticed since beginning the new antibiotic that he has having redness in the groin area but no other areas of his body.  He has used Lotrisone cream in the past for this and it has helped but he is out.  Would like to know if you will send in a refill to [facility].

## 2021-03-02 NOTE — Telephone Encounter (Signed)
Patients wife aware

## 2021-03-04 ENCOUNTER — Telehealth: Payer: Self-pay | Admitting: *Deleted

## 2021-03-04 NOTE — Telephone Encounter (Signed)
FYI: Vm from [place] w/ [facility] Wife called in to Palliative care, pt penis is red, swollen, tender to touch, no longer itching, or burning Palliative NP called in Diflucan & Nystatin powder for yeast infection.

## 2021-03-07 DIAGNOSIS — G20 Parkinson's disease: Secondary | ICD-10-CM | POA: Diagnosis not present

## 2021-03-07 DIAGNOSIS — G3183 Dementia with Lewy bodies: Secondary | ICD-10-CM | POA: Diagnosis not present

## 2021-03-07 DIAGNOSIS — R5381 Other malaise: Secondary | ICD-10-CM | POA: Diagnosis not present

## 2021-03-07 DIAGNOSIS — M79601 Pain in right arm: Secondary | ICD-10-CM | POA: Diagnosis not present

## 2021-03-11 NOTE — Telephone Encounter (Signed)
I spoke to wife and advised her that DM was added to problem list in 2014, mild cognitive impairment in 2014 and parkinsons in 11/2018. Pt's wife voiced understanding.

## 2021-03-11 NOTE — Telephone Encounter (Signed)
Pt's wife would like to know when the pt was diagnosed with dementia, parkinson and diabetes and needs documentation of when this was.

## 2021-03-16 ENCOUNTER — Telehealth: Payer: Self-pay | Admitting: Family Medicine

## 2021-03-16 NOTE — Telephone Encounter (Signed)
Office visit needed

## 2021-03-16 NOTE — Telephone Encounter (Signed)
  Incoming Patient Call  03/16/2021  What symptoms do you have? Urine is dark and smelly   How long have you been sick? Since last appt with Dr. Darlyn Read  Have you been seen for this problem? Yes he finished round of antibiotics wife thinks he still has UTI  If your provider decides to give you a prescription, which pharmacy would you like for it to be sent to? [facility]    Patient informed that this information will be sent to the clinical staff for review and that they should receive a follow up call.

## 2021-03-17 ENCOUNTER — Encounter: Payer: Self-pay | Admitting: Family Medicine

## 2021-03-17 ENCOUNTER — Ambulatory Visit (INDEPENDENT_AMBULATORY_CARE_PROVIDER_SITE_OTHER): Payer: Medicare Other | Admitting: Family Medicine

## 2021-03-17 DIAGNOSIS — R300 Dysuria: Secondary | ICD-10-CM | POA: Diagnosis not present

## 2021-03-17 MED ORDER — AMOXICILLIN-POT CLAVULANATE 875-125 MG PO TABS
1.0000 | ORAL_TABLET | Freq: Two times a day (BID) | ORAL | 0 refills | Status: AC
Start: 2021-03-17 — End: 2021-03-24

## 2021-03-17 NOTE — Progress Notes (Signed)
Virtual Visit via telephone Note Due to COVID-19 pandemic this visit was conducted virtually. This visit type was conducted due to national recommendations for restrictions regarding the COVID-19 Pandemic (e.g. social distancing, sheltering in place) in an effort to limit this patient's exposure and mitigate transmission in our community. All issues noted in this document were discussed and addressed.  A physical exam was not performed with this format.   I connected with Jon [name]'s caregiver on 03/17/2021 at 1320 by telephone and verified that I am speaking with the correct person using two identifiers. Jon [name] is currently located at home and  caregiver  is currently with them during visit. The provider, Kari Baars, FNP is located in their office at time of visit.  I discussed the limitations, risks, security and privacy concerns of performing an evaluation and management service by telephone and the availability of in person appointments. I also discussed with the patient that there may be a patient responsible charge related to this service. The patient expressed understanding and agreed to proceed.  Subjective:  Patient ID: Jon [name], male    DOB: [birth year], 75 y.o.   MRN: [id number]  Chief Complaint:  Dysuria   HPI: Jon [name] is a 75 y.o. male presenting on 03/17/2021 for Dysuria   Pt was recently treated for an UTI and yeast infection. Caregiver reports his urine is still dark, cloudy, and malodorous. He has completed the course of antibiotics. She denies fever, chills, or changes in mental status. She reports the yeast infection has cleared.     Relevant past medical, surgical, family, and social history reviewed and updated as indicated.  Allergies and medications reviewed and updated.   Past Medical History:  Diagnosis Date   AAA (abdominal aortic aneurysm) (HCC)    Alzheimer disease (HCC)    Anemia    Ataxia    Diabetes mellitus without complication  (HCC)    History of gallstones    Hyperlipidemia    Hypertension    Vitamin D deficiency     Past Surgical History:  Procedure Laterality Date   ERCP     INGUINAL HERNIA REPAIR     TONSILLECTOMY      Social History   Socioeconomic History   Marital status: Married    Spouse name: [name]   Number of children: 1   Years of education: 12   Highest education level: High school graduate  Occupational History   Occupation: retired  Tobacco Use   Smoking status: Former    Packs/day: 1.00    Years: 40.00    Pack years: 40.00    Types: Cigarettes    Start date: 07/03/1966    Quit date: 02/06/2009    Years since quitting: 12.1   Smokeless tobacco: Never  Vaping Use   Vaping Use: Never used  Substance and Sexual Activity   Alcohol use: No    Alcohol/week: 0.0 standard drinks   Drug use: No   Sexual activity: Not Currently  Other Topics Concern   Not on file  Social History Narrative   Completely dependent patient - lives home with his wife - bedridden - gets VA benefits   Has a CNA 4 days per week; wife and daughter provide care the rest of the time.   Social Determinants of Health   Financial Resource Strain: Low Risk    Difficulty of Paying Living Expenses: Not hard at all  Food Insecurity: No Food Insecurity   Worried About Running  Out of Food in the Last Year: Never true   Ran Out of Food in the Last Year: Never true  Transportation Needs: No Transportation Needs   Lack of Transportation (Medical): No   Lack of Transportation (Non-Medical): No  Physical Activity: Inactive   Days of Exercise per Week: 0 days   Minutes of Exercise per Session: 0 min  Stress: No Stress Concern Present   Feeling of Stress : Only a little  Social Connections: Moderately Isolated   Frequency of Communication with Friends and Family: Never   Frequency of Social Gatherings with Friends and Family: More than three times a week   Attends Religious Services: Never   Database administrator  or Organizations: No   Attends Banker Meetings: Never   Marital Status: Married  Catering manager Violence: Not At Risk   Fear of Current or Ex-Partner: No   Emotionally Abused: No   Physically Abused: No   Sexually Abused: No    Outpatient Encounter Medications as of 03/17/2021  Medication Sig   amoxicillin-clavulanate (AUGMENTIN) 875-125 MG tablet Take 1 tablet by mouth 2 (two) times daily for 7 days.   Acetaminophen (TYLENOL ARTHRITIS EXT RELIEF PO) Take by mouth.   aspirin 81 MG tablet Take 81 mg by mouth daily.   Cholecalciferol (VITAMIN D) 50 MCG (2000 UT) CAPS Take 1 capsule by mouth daily.   clotrimazole-betamethasone (LOTRISONE) cream Apply 1 application topically 2 (two) times daily. To affected areas until rash clears   escitalopram (LEXAPRO) 10 MG tablet TAKE 1 TABLET DAILY)   LORazepam (ATIVAN) 0.5 MG tablet Take 0.5 mg by mouth 2 (two) times daily as needed.   metFORMIN (GLUCOPHAGE) 1000 MG tablet TAKE 1 TABLET DAILY WITH BREAKFAST   omeprazole (PRILOSEC) 20 MG capsule TAKE 1 CAPSULE DAILY   rivastigmine (EXELON) 4.5 MG capsule TAKE 1 CAPSULE TWICE A DAY   sertraline (ZOLOFT) 25 MG tablet Take 25 mg by mouth daily.   tamsulosin (FLOMAX) 0.4 MG CAPS capsule Take by mouth.   [DISCONTINUED] amoxicillin (AMOXIL) 500 MG capsule Take 1 capsule (500 mg total) by mouth 3 (three) times daily.   No facility-administered encounter medications on file as of 03/17/2021.    Allergies  Allergen Reactions   Donepezil Nausea And Vomiting   Namenda [Memantine Hcl] Other (See Comments)    Sleeps all the time   Phenergan [Promethazine Hcl] Other (See Comments)    Aggressive      Review of Systems  Unable to perform ROS: Dementia (ROS per caregiver)  Constitutional:  Negative for chills and fever.  Genitourinary:  Positive for dysuria. Negative for decreased urine volume and difficulty urinating.       Dark, cloudy, malodorous urine         Observations/Objective: No vital signs or physical exam, this was a telephone or virtual health encounter.  Pt alert and oriented, answers all questions appropriately, and able to speak in full sentences.    Assessment and Plan: Jon [name] was seen today for dysuria.  Diagnoses and all orders for this visit:  Dysuria Ongoing UTI symptoms despite completion of antibiotic therapy. Caregiver will bring urine to office for analysis. Will empirically start Augmentin based on last urine culture results. Will adjust therapy if warranted. No reported indications of acute pyelonephritis.  -     Urine Culture -     Urinalysis, Complete -     amoxicillin-clavulanate (AUGMENTIN) 875-125 MG tablet; Take 1 tablet by mouth 2 (two) times daily for  7 days.    Follow Up Instructions: Return in about 4 weeks (around 04/14/2021), or if symptoms worsen or fail to improve, for urine recheck.    I discussed the assessment and treatment plan with the patient. The patient was provided an opportunity to ask questions and all were answered. The patient agreed with the plan and demonstrated an understanding of the instructions.   The patient was advised to call back or seek an in-person evaluation if the symptoms worsen or if the condition fails to improve as anticipated.  The above assessment and management plan was discussed with the patient. The patient verbalized understanding of and has agreed to the management plan. Patient is aware to call the clinic if they develop any new symptoms or if symptoms persist or worsen. Patient is aware when to return to the clinic for a follow-up visit. Patient educated on when it is appropriate to go to the emergency department.    I provided 12 minutes of non-face-to-face time during this encounter. The call started at 1320. The call ended at 1330. The other time was used for coordination of care.    Kari Baars, FNP-C [facility] [address] [place], [place] [address] [phone] 03/17/2021

## 2021-03-17 NOTE — Telephone Encounter (Signed)
Tele done today - antibiotic given - leaving urine tomorrow

## 2021-03-18 ENCOUNTER — Other Ambulatory Visit: Payer: Medicare Other

## 2021-03-18 DIAGNOSIS — R300 Dysuria: Secondary | ICD-10-CM | POA: Diagnosis not present

## 2021-03-18 LAB — URINALYSIS, COMPLETE
Bilirubin, UA: NEGATIVE
Ketones, UA: NEGATIVE
Leukocytes,UA: NEGATIVE
Nitrite, UA: NEGATIVE
Protein,UA: NEGATIVE
RBC, UA: NEGATIVE
Specific Gravity, UA: >1.03 — ABNORMAL HIGH (ref 1.005–1.030)
Urobilinogen, Ur: 2 mg/dL — ABNORMAL HIGH (ref 0.2–1.0)
pH, UA: 5 (ref 5.0–7.5)

## 2021-03-18 LAB — MICROSCOPIC EXAMINATION
RBC, Urine: NONE SEEN /hpf (ref 0–2)
Renal Epithel, UA: NONE SEEN /hpf
WBC, UA: NONE SEEN /hpf (ref 0–5)

## 2021-03-18 NOTE — Progress Notes (Signed)
Pending culture.

## 2021-03-21 ENCOUNTER — Other Ambulatory Visit: Payer: Self-pay | Admitting: *Deleted

## 2021-03-23 LAB — URINE CULTURE

## 2021-03-23 NOTE — Progress Notes (Signed)
Hello  Masson,    Your lab result is normal and/or stable.Some minor variations that are not significant are commonly marked abnormal, but do not represent any medical problem for you.   Best regards,  Mikie Misner, M.D.

## 2021-04-14 ENCOUNTER — Encounter: Payer: Self-pay | Admitting: Family Medicine

## 2021-04-14 ENCOUNTER — Telehealth (INDEPENDENT_AMBULATORY_CARE_PROVIDER_SITE_OTHER): Payer: Medicare Other | Admitting: Family Medicine

## 2021-04-14 DIAGNOSIS — Z515 Encounter for palliative care: Secondary | ICD-10-CM

## 2021-04-14 DIAGNOSIS — G3183 Dementia with Lewy bodies: Secondary | ICD-10-CM | POA: Diagnosis not present

## 2021-04-14 DIAGNOSIS — R5381 Other malaise: Secondary | ICD-10-CM | POA: Diagnosis not present

## 2021-04-14 DIAGNOSIS — F02818 Dementia in other diseases classified elsewhere, unspecified severity, with other behavioral disturbance: Secondary | ICD-10-CM | POA: Diagnosis not present

## 2021-04-14 DIAGNOSIS — Z7409 Other reduced mobility: Secondary | ICD-10-CM

## 2021-04-14 DIAGNOSIS — G20 Parkinson's disease: Secondary | ICD-10-CM

## 2021-04-14 DIAGNOSIS — R410 Disorientation, unspecified: Secondary | ICD-10-CM

## 2021-04-14 NOTE — Progress Notes (Signed)
Virtual Visit via MyChart Video Note Due to COVID-19 pandemic this visit was conducted virtually. This visit type was conducted due to national recommendations for restrictions regarding the COVID-19 Pandemic (e.g. social distancing, sheltering in place) in an effort to limit this patient's exposure and mitigate transmission in our community. All issues noted in this document were discussed and addressed.  A physical exam was not performed with this format.   I connected with Jon [name] and his daughter Jon [name] on 04/14/2021 at 1500 by MyChart Video and verified that I am speaking with the correct person using two identifiers. Jon [name] is currently located at home and family is currently with them during visit. The provider, Kari Baars, FNP is located in their office at time of visit.  I discussed the limitations, risks, security and privacy concerns of performing an evaluation and management service by telephone and the availability of in person appointments. I also discussed with the patient that there may be a patient responsible charge related to this service. The patient expressed understanding and agreed to proceed.  Subjective:  Patient ID: Jon [name], male    DOB: [birth year], 75 y.o.   MRN: [id number]  Chief Complaint:  FL2 forms   HPI: Jon [name] is a 75 y.o. male presenting on 04/14/2021 for [facility] forms   Patient visit today conducted for [facility] form completion.  Patient has Lewy body dementia with behavioral disturbances, physical debility and has been bedridden for the last 4 to 5 years, Parkinson's disease, episodic confusion, and urinary and bowel incontinence.  Patient's primary caregiver has been his wife for the last several years.  Wife is currently ill and Hospitalized at [facility].  Patient is alert to name, place, and date of birth.  He is confused to date and time.  He does give consent for this provider to speak with his  daughter Jon [name] pertaining to care. Daughter states patient can get agitated at times but is typically not physically abusive.  He has recently been placed on new medications for his agitation by neurology.  Daughter states he has been tolerating this well.  He is nonambulatory at and is incontinent of urine and stool.  He cannot communicate verbally and express needs.  He does not voice any complaints today.    Relevant past medical, surgical, family, and social history reviewed and updated as indicated.  Allergies and medications reviewed and updated.   Past Medical History:  Diagnosis Date   AAA (abdominal aortic aneurysm)    Alzheimer disease (HCC)    Anemia    Ataxia    Diabetes mellitus without complication (HCC)    History of gallstones    Hyperlipidemia    Hypertension    Vitamin D deficiency     Past Surgical History:  Procedure Laterality Date   ERCP     INGUINAL HERNIA REPAIR     TONSILLECTOMY      Social History   Socioeconomic History   Marital status: Married    Spouse name: [name]   Number of children: 1   Years of education: 12   Highest education level: High school graduate  Occupational History   Occupation: retired  Tobacco Use   Smoking status: Former    Packs/day: 1.00    Years: 40.00    Pack years: 40.00    Types: Cigarettes    Start date: 07/03/1966    Quit date: 02/06/2009    Years since quitting:  12.1   Smokeless tobacco: Never  Vaping Use   Vaping Use: Never used  Substance and Sexual Activity   Alcohol use: No    Alcohol/week: 0.0 standard drinks   Drug use: No   Sexual activity: Not Currently  Other Topics Concern   Not on file  Social History Narrative   Completely dependent patient - lives home with his wife - bedridden - gets [facility] benefits   Has a CNA 4 days per week; wife and daughter provide care the rest of the time.   Social Determinants of Health   Financial Resource Strain: Low Risk    Difficulty of Paying Living  Expenses: Not hard at all  Food Insecurity: No Food Insecurity   Worried About Programme researcher, broadcasting/film/video in the Last Year: Never true   Ran Out of Food in the Last Year: Never true  Transportation Needs: No Transportation Needs   Lack of Transportation (Medical): No   Lack of Transportation (Non-Medical): No  Physical Activity: Inactive   Days of Exercise per Week: 0 days   Minutes of Exercise per Session: 0 min  Stress: No Stress Concern Present   Feeling of Stress : Only a little  Social Connections: Moderately Isolated   Frequency of Communication with Friends and Family: Never   Frequency of Social Gatherings with Friends and Family: More than three times a week   Attends Religious Services: Never   Database administrator or Organizations: No   Attends Banker Meetings: Never   Marital Status: Married  Catering manager Violence: Not At Risk   Fear of Current or Ex-Partner: No   Emotionally Abused: No   Physically Abused: No   Sexually Abused: No    Outpatient Encounter Medications as of 04/14/2021  Medication Sig   Acetaminophen (TYLENOL ARTHRITIS EXT RELIEF PO) Take by mouth.   aspirin 81 MG tablet Take 81 mg by mouth daily.   Cholecalciferol (VITAMIN D) 50 MCG (2000 UT) CAPS Take 1 capsule by mouth daily.   clotrimazole-betamethasone (LOTRISONE) cream Apply 1 application topically 2 (two) times daily. To affected areas until rash clears   escitalopram (LEXAPRO) 10 MG tablet TAKE 1 TABLET DAILY)   LORazepam (ATIVAN) 0.5 MG tablet Take 0.5 mg by mouth 2 (two) times daily as needed.   metFORMIN (GLUCOPHAGE) 1000 MG tablet TAKE 1 TABLET DAILY WITH BREAKFAST   omeprazole (PRILOSEC) 20 MG capsule TAKE 1 CAPSULE DAILY   rivastigmine (EXELON) 4.5 MG capsule TAKE 1 CAPSULE TWICE A DAY   sertraline (ZOLOFT) 25 MG tablet Take 25 mg by mouth daily.   tamsulosin (FLOMAX) 0.4 MG CAPS capsule Take by mouth.   No facility-administered encounter medications on file as of  04/14/2021.    Allergies  Allergen Reactions   Donepezil Nausea And Vomiting   Namenda [Memantine Hcl] Other (See Comments)    Sleeps all the time   Phenergan [Promethazine Hcl] Other (See Comments)    Aggressive      Review of Systems  Unable to perform ROS: Dementia        Observations/Objective: No vital signs or physical exam, this was a telephone or virtual health encounter.  Pt alert and oriented, answers all questions appropriately, and able to speak in full sentences.    Assessment and Plan: Jeanpaul was seen today for fl2 forms.  Diagnoses and all orders for this visit:  Lewy body dementia with behavioral disturbance (HCC) Impaired functional mobility, balance, gait, and endurance Physical debility Episodic confusion  Hospice care patient Primary parkinsonism (HCC) FL2 form completed today. Pt and daughter aware to call for any concerns or needs. Follow up with PCP as scheduled.     Follow Up Instructions: Return if symptoms worsen or fail to improve.    I discussed the assessment and treatment plan with the patient. The patient was provided an opportunity to ask questions and all were answered. The patient agreed with the plan and demonstrated an understanding of the instructions.   The patient was advised to call back or seek an in-person evaluation if the symptoms worsen or if the condition fails to improve as anticipated.  The above assessment and management plan was discussed with the patient. The patient verbalized understanding of and has agreed to the management plan. Patient is aware to call the clinic if they develop any new symptoms or if symptoms persist or worsen. Patient is aware when to return to the clinic for a follow-up visit. Patient educated on when it is appropriate to go to the emergency department.    I provided 20 minutes of non-face-to-face time during this encounter. The call started at 1500. The call ended at 1520. The other time was  used for coordination of care.    Kari Baars, FNP-C [facility] [address] [place], [place] [address] [phone] 04/14/2021

## 2021-04-19 ENCOUNTER — Telehealth: Payer: Self-pay | Admitting: Family Medicine

## 2021-04-19 NOTE — Telephone Encounter (Signed)
Cathren Laine (NP for [facility]) called to check status of FL2 and if we had heard anything about placement for patient.  Please call Lory with update. [phone]

## 2021-04-20 NOTE — Telephone Encounter (Signed)
FL2 was completed and picked up by daughter the day after the visit. Family will have to call around in [place] to find a place for placement. When they find a place and they are needing OV notes or the [facility] faxed to them they will let me know.

## 2021-04-21 ENCOUNTER — Telehealth: Payer: Self-pay | Admitting: *Deleted

## 2021-04-21 DIAGNOSIS — G3183 Dementia with Lewy bodies: Secondary | ICD-10-CM | POA: Diagnosis not present

## 2021-04-21 DIAGNOSIS — R5381 Other malaise: Secondary | ICD-10-CM | POA: Diagnosis not present

## 2021-04-21 DIAGNOSIS — G20 Parkinson's disease: Secondary | ICD-10-CM | POA: Diagnosis not present

## 2021-04-21 DIAGNOSIS — M79601 Pain in right arm: Secondary | ICD-10-CM | POA: Diagnosis not present

## 2021-04-21 NOTE — Telephone Encounter (Signed)
TC w/ Lawson Fiscal, NP w/ [facility] Update: Pt ran low grade fever yesterday, today 102. [facility] which was [facility], she is thinking UTI so she Rxd Cipro and will see him in the morning. Trying to get help with placement for him but is a little difficult with his insurance. Wife has been in the hospital reason for the placement, not doing well conversation with daughter is that she has about 2 days to live and Hospice will be seeing her today. Lawson Fiscal may try to get pt transferred to Hospice and if so she maybe able to get him placed in [facility]pite at [facility].

## 2021-04-22 DIAGNOSIS — R112 Nausea with vomiting, unspecified: Secondary | ICD-10-CM | POA: Diagnosis not present

## 2021-04-22 DIAGNOSIS — Z87891 Personal history of nicotine dependence: Secondary | ICD-10-CM | POA: Diagnosis not present

## 2021-04-22 DIAGNOSIS — S32000S Wedge compression fracture of unspecified lumbar vertebra, sequela: Secondary | ICD-10-CM | POA: Diagnosis not present

## 2021-04-22 DIAGNOSIS — G3183 Dementia with Lewy bodies: Secondary | ICD-10-CM | POA: Diagnosis not present

## 2021-04-22 DIAGNOSIS — G4733 Obstructive sleep apnea (adult) (pediatric): Secondary | ICD-10-CM | POA: Diagnosis not present

## 2021-04-22 DIAGNOSIS — I70203 Unspecified atherosclerosis of native arteries of extremities, bilateral legs: Secondary | ICD-10-CM | POA: Diagnosis not present

## 2021-04-22 DIAGNOSIS — R5381 Other malaise: Secondary | ICD-10-CM | POA: Diagnosis not present

## 2021-04-22 DIAGNOSIS — E559 Vitamin D deficiency, unspecified: Secondary | ICD-10-CM | POA: Diagnosis not present

## 2021-04-22 DIAGNOSIS — J439 Emphysema, unspecified: Secondary | ICD-10-CM | POA: Diagnosis not present

## 2021-04-22 DIAGNOSIS — F02818 Dementia in other diseases classified elsewhere, unspecified severity, with other behavioral disturbance: Secondary | ICD-10-CM | POA: Diagnosis not present

## 2021-04-22 DIAGNOSIS — I714 Abdominal aortic aneurysm, without rupture, unspecified: Secondary | ICD-10-CM | POA: Diagnosis not present

## 2021-04-22 DIAGNOSIS — S32008S Other fracture of unspecified lumbar vertebra, sequela: Secondary | ICD-10-CM | POA: Diagnosis not present

## 2021-04-22 DIAGNOSIS — Z794 Long term (current) use of insulin: Secondary | ICD-10-CM | POA: Diagnosis not present

## 2021-04-22 DIAGNOSIS — I10 Essential (primary) hypertension: Secondary | ICD-10-CM | POA: Diagnosis not present

## 2021-04-22 DIAGNOSIS — E1169 Type 2 diabetes mellitus with other specified complication: Secondary | ICD-10-CM | POA: Diagnosis not present

## 2021-04-24 DIAGNOSIS — E1169 Type 2 diabetes mellitus with other specified complication: Secondary | ICD-10-CM | POA: Diagnosis not present

## 2021-04-24 DIAGNOSIS — F02818 Dementia in other diseases classified elsewhere, unspecified severity, with other behavioral disturbance: Secondary | ICD-10-CM | POA: Diagnosis not present

## 2021-04-24 DIAGNOSIS — I10 Essential (primary) hypertension: Secondary | ICD-10-CM | POA: Diagnosis not present

## 2021-04-24 DIAGNOSIS — R5381 Other malaise: Secondary | ICD-10-CM | POA: Diagnosis not present

## 2021-04-24 DIAGNOSIS — R112 Nausea with vomiting, unspecified: Secondary | ICD-10-CM | POA: Diagnosis not present

## 2021-04-24 DIAGNOSIS — G3183 Dementia with Lewy bodies: Secondary | ICD-10-CM | POA: Diagnosis not present

## 2021-04-25 ENCOUNTER — Telehealth: Payer: Self-pay | Admitting: Family Medicine

## 2021-04-25 DIAGNOSIS — F02818 Dementia in other diseases classified elsewhere, unspecified severity, with other behavioral disturbance: Secondary | ICD-10-CM | POA: Diagnosis not present

## 2021-04-25 DIAGNOSIS — E1169 Type 2 diabetes mellitus with other specified complication: Secondary | ICD-10-CM | POA: Diagnosis not present

## 2021-04-25 DIAGNOSIS — R5381 Other malaise: Secondary | ICD-10-CM | POA: Diagnosis not present

## 2021-04-25 DIAGNOSIS — R112 Nausea with vomiting, unspecified: Secondary | ICD-10-CM | POA: Diagnosis not present

## 2021-04-25 DIAGNOSIS — I10 Essential (primary) hypertension: Secondary | ICD-10-CM | POA: Diagnosis not present

## 2021-04-25 DIAGNOSIS — G3183 Dementia with Lewy bodies: Secondary | ICD-10-CM | POA: Diagnosis not present

## 2021-04-25 NOTE — Telephone Encounter (Signed)
FYI: see visit info from [facility]

## 2021-04-26 DIAGNOSIS — F02818 Dementia in other diseases classified elsewhere, unspecified severity, with other behavioral disturbance: Secondary | ICD-10-CM | POA: Diagnosis not present

## 2021-04-26 DIAGNOSIS — R112 Nausea with vomiting, unspecified: Secondary | ICD-10-CM | POA: Diagnosis not present

## 2021-04-26 DIAGNOSIS — R5381 Other malaise: Secondary | ICD-10-CM | POA: Diagnosis not present

## 2021-04-26 DIAGNOSIS — E1169 Type 2 diabetes mellitus with other specified complication: Secondary | ICD-10-CM | POA: Diagnosis not present

## 2021-04-26 DIAGNOSIS — I10 Essential (primary) hypertension: Secondary | ICD-10-CM | POA: Diagnosis not present

## 2021-04-26 DIAGNOSIS — G3183 Dementia with Lewy bodies: Secondary | ICD-10-CM | POA: Diagnosis not present

## 2021-04-27 DIAGNOSIS — G3183 Dementia with Lewy bodies: Secondary | ICD-10-CM | POA: Diagnosis not present

## 2021-04-27 DIAGNOSIS — I10 Essential (primary) hypertension: Secondary | ICD-10-CM | POA: Diagnosis not present

## 2021-04-27 DIAGNOSIS — E1169 Type 2 diabetes mellitus with other specified complication: Secondary | ICD-10-CM | POA: Diagnosis not present

## 2021-04-27 DIAGNOSIS — F02818 Dementia in other diseases classified elsewhere, unspecified severity, with other behavioral disturbance: Secondary | ICD-10-CM | POA: Diagnosis not present

## 2021-04-27 DIAGNOSIS — R5381 Other malaise: Secondary | ICD-10-CM | POA: Diagnosis not present

## 2021-04-27 DIAGNOSIS — R112 Nausea with vomiting, unspecified: Secondary | ICD-10-CM | POA: Diagnosis not present

## 2021-04-28 DIAGNOSIS — I10 Essential (primary) hypertension: Secondary | ICD-10-CM | POA: Diagnosis not present

## 2021-04-28 DIAGNOSIS — G3183 Dementia with Lewy bodies: Secondary | ICD-10-CM | POA: Diagnosis not present

## 2021-04-28 DIAGNOSIS — E1169 Type 2 diabetes mellitus with other specified complication: Secondary | ICD-10-CM | POA: Diagnosis not present

## 2021-04-28 DIAGNOSIS — R112 Nausea with vomiting, unspecified: Secondary | ICD-10-CM | POA: Diagnosis not present

## 2021-04-28 DIAGNOSIS — F02818 Dementia in other diseases classified elsewhere, unspecified severity, with other behavioral disturbance: Secondary | ICD-10-CM | POA: Diagnosis not present

## 2021-04-28 DIAGNOSIS — R5381 Other malaise: Secondary | ICD-10-CM | POA: Diagnosis not present

## 2021-04-29 DIAGNOSIS — G3183 Dementia with Lewy bodies: Secondary | ICD-10-CM | POA: Diagnosis not present

## 2021-04-29 DIAGNOSIS — E119 Type 2 diabetes mellitus without complications: Secondary | ICD-10-CM | POA: Diagnosis not present

## 2021-04-29 DIAGNOSIS — E1169 Type 2 diabetes mellitus with other specified complication: Secondary | ICD-10-CM | POA: Diagnosis not present

## 2021-04-29 DIAGNOSIS — J449 Chronic obstructive pulmonary disease, unspecified: Secondary | ICD-10-CM | POA: Diagnosis not present

## 2021-04-29 DIAGNOSIS — R112 Nausea with vomiting, unspecified: Secondary | ICD-10-CM | POA: Diagnosis not present

## 2021-04-29 DIAGNOSIS — I739 Peripheral vascular disease, unspecified: Secondary | ICD-10-CM | POA: Diagnosis not present

## 2021-04-29 DIAGNOSIS — F02818 Dementia in other diseases classified elsewhere, unspecified severity, with other behavioral disturbance: Secondary | ICD-10-CM | POA: Diagnosis not present

## 2021-04-29 DIAGNOSIS — Z8616 Personal history of COVID-19: Secondary | ICD-10-CM | POA: Diagnosis not present

## 2021-04-29 DIAGNOSIS — I714 Abdominal aortic aneurysm, without rupture, unspecified: Secondary | ICD-10-CM | POA: Diagnosis not present

## 2021-04-29 DIAGNOSIS — F02811 Dementia in other diseases classified elsewhere, unspecified severity, with agitation: Secondary | ICD-10-CM | POA: Diagnosis not present

## 2021-04-29 DIAGNOSIS — G4733 Obstructive sleep apnea (adult) (pediatric): Secondary | ICD-10-CM | POA: Diagnosis not present

## 2021-04-29 DIAGNOSIS — E559 Vitamin D deficiency, unspecified: Secondary | ICD-10-CM | POA: Diagnosis not present

## 2021-04-29 DIAGNOSIS — I10 Essential (primary) hypertension: Secondary | ICD-10-CM | POA: Diagnosis not present

## 2021-04-29 DIAGNOSIS — R5381 Other malaise: Secondary | ICD-10-CM | POA: Diagnosis not present

## 2021-05-02 DIAGNOSIS — L853 Xerosis cutis: Secondary | ICD-10-CM | POA: Diagnosis not present

## 2021-05-02 DIAGNOSIS — F02811 Dementia in other diseases classified elsewhere, unspecified severity, with agitation: Secondary | ICD-10-CM | POA: Diagnosis not present

## 2021-05-02 DIAGNOSIS — G3183 Dementia with Lewy bodies: Secondary | ICD-10-CM | POA: Diagnosis not present

## 2021-05-02 DIAGNOSIS — I10 Essential (primary) hypertension: Secondary | ICD-10-CM | POA: Diagnosis not present

## 2021-05-02 DIAGNOSIS — Z8616 Personal history of COVID-19: Secondary | ICD-10-CM | POA: Diagnosis not present

## 2021-05-02 DIAGNOSIS — E119 Type 2 diabetes mellitus without complications: Secondary | ICD-10-CM | POA: Diagnosis not present

## 2021-05-02 DIAGNOSIS — J449 Chronic obstructive pulmonary disease, unspecified: Secondary | ICD-10-CM | POA: Diagnosis not present

## 2021-05-03 DIAGNOSIS — I714 Abdominal aortic aneurysm, without rupture, unspecified: Secondary | ICD-10-CM | POA: Diagnosis not present

## 2021-05-03 DIAGNOSIS — F0390 Unspecified dementia without behavioral disturbance: Secondary | ICD-10-CM | POA: Diagnosis not present

## 2021-05-03 DIAGNOSIS — Z8616 Personal history of COVID-19: Secondary | ICD-10-CM | POA: Diagnosis not present

## 2021-05-03 DIAGNOSIS — G3183 Dementia with Lewy bodies: Secondary | ICD-10-CM | POA: Diagnosis not present

## 2021-05-03 DIAGNOSIS — E118 Type 2 diabetes mellitus with unspecified complications: Secondary | ICD-10-CM | POA: Diagnosis not present

## 2021-05-03 DIAGNOSIS — I10 Essential (primary) hypertension: Secondary | ICD-10-CM | POA: Diagnosis not present

## 2021-05-03 DIAGNOSIS — J449 Chronic obstructive pulmonary disease, unspecified: Secondary | ICD-10-CM | POA: Diagnosis not present

## 2021-05-03 DIAGNOSIS — G4733 Obstructive sleep apnea (adult) (pediatric): Secondary | ICD-10-CM | POA: Diagnosis not present

## 2021-05-03 DIAGNOSIS — E559 Vitamin D deficiency, unspecified: Secondary | ICD-10-CM | POA: Diagnosis not present

## 2021-05-03 DIAGNOSIS — F02811 Dementia in other diseases classified elsewhere, unspecified severity, with agitation: Secondary | ICD-10-CM | POA: Diagnosis not present

## 2021-05-03 DIAGNOSIS — I739 Peripheral vascular disease, unspecified: Secondary | ICD-10-CM | POA: Diagnosis not present

## 2021-05-03 DIAGNOSIS — M6281 Muscle weakness (generalized): Secondary | ICD-10-CM | POA: Diagnosis not present

## 2021-05-03 DIAGNOSIS — E119 Type 2 diabetes mellitus without complications: Secondary | ICD-10-CM | POA: Diagnosis not present

## 2021-05-05 DIAGNOSIS — I10 Essential (primary) hypertension: Secondary | ICD-10-CM | POA: Diagnosis not present

## 2021-05-05 DIAGNOSIS — E119 Type 2 diabetes mellitus without complications: Secondary | ICD-10-CM | POA: Diagnosis not present

## 2021-05-05 DIAGNOSIS — F02811 Dementia in other diseases classified elsewhere, unspecified severity, with agitation: Secondary | ICD-10-CM | POA: Diagnosis not present

## 2021-05-05 DIAGNOSIS — Z8616 Personal history of COVID-19: Secondary | ICD-10-CM | POA: Diagnosis not present

## 2021-05-05 DIAGNOSIS — J449 Chronic obstructive pulmonary disease, unspecified: Secondary | ICD-10-CM | POA: Diagnosis not present

## 2021-05-05 DIAGNOSIS — G3183 Dementia with Lewy bodies: Secondary | ICD-10-CM | POA: Diagnosis not present

## 2021-05-06 DIAGNOSIS — J449 Chronic obstructive pulmonary disease, unspecified: Secondary | ICD-10-CM | POA: Diagnosis not present

## 2021-05-06 DIAGNOSIS — G3183 Dementia with Lewy bodies: Secondary | ICD-10-CM | POA: Diagnosis not present

## 2021-05-06 DIAGNOSIS — Z8616 Personal history of COVID-19: Secondary | ICD-10-CM | POA: Diagnosis not present

## 2021-05-06 DIAGNOSIS — E118 Type 2 diabetes mellitus with unspecified complications: Secondary | ICD-10-CM | POA: Diagnosis not present

## 2021-05-06 DIAGNOSIS — M6281 Muscle weakness (generalized): Secondary | ICD-10-CM | POA: Diagnosis not present

## 2021-05-06 DIAGNOSIS — E119 Type 2 diabetes mellitus without complications: Secondary | ICD-10-CM | POA: Diagnosis not present

## 2021-05-06 DIAGNOSIS — F02811 Dementia in other diseases classified elsewhere, unspecified severity, with agitation: Secondary | ICD-10-CM | POA: Diagnosis not present

## 2021-05-06 DIAGNOSIS — I10 Essential (primary) hypertension: Secondary | ICD-10-CM | POA: Diagnosis not present

## 2021-05-06 DIAGNOSIS — F0390 Unspecified dementia without behavioral disturbance: Secondary | ICD-10-CM | POA: Diagnosis not present

## 2021-05-10 DIAGNOSIS — G3183 Dementia with Lewy bodies: Secondary | ICD-10-CM | POA: Diagnosis not present

## 2021-05-10 DIAGNOSIS — F02811 Dementia in other diseases classified elsewhere, unspecified severity, with agitation: Secondary | ICD-10-CM | POA: Diagnosis not present

## 2021-05-10 DIAGNOSIS — J449 Chronic obstructive pulmonary disease, unspecified: Secondary | ICD-10-CM | POA: Diagnosis not present

## 2021-05-10 DIAGNOSIS — E119 Type 2 diabetes mellitus without complications: Secondary | ICD-10-CM | POA: Diagnosis not present

## 2021-05-10 DIAGNOSIS — I10 Essential (primary) hypertension: Secondary | ICD-10-CM | POA: Diagnosis not present

## 2021-05-10 DIAGNOSIS — Z8616 Personal history of COVID-19: Secondary | ICD-10-CM | POA: Diagnosis not present

## 2021-05-11 DIAGNOSIS — E119 Type 2 diabetes mellitus without complications: Secondary | ICD-10-CM | POA: Diagnosis not present

## 2021-05-11 DIAGNOSIS — I10 Essential (primary) hypertension: Secondary | ICD-10-CM | POA: Diagnosis not present

## 2021-05-11 DIAGNOSIS — J449 Chronic obstructive pulmonary disease, unspecified: Secondary | ICD-10-CM | POA: Diagnosis not present

## 2021-05-11 DIAGNOSIS — F02811 Dementia in other diseases classified elsewhere, unspecified severity, with agitation: Secondary | ICD-10-CM | POA: Diagnosis not present

## 2021-05-11 DIAGNOSIS — Z8616 Personal history of COVID-19: Secondary | ICD-10-CM | POA: Diagnosis not present

## 2021-05-11 DIAGNOSIS — G3183 Dementia with Lewy bodies: Secondary | ICD-10-CM | POA: Diagnosis not present

## 2021-05-12 DIAGNOSIS — I10 Essential (primary) hypertension: Secondary | ICD-10-CM | POA: Diagnosis not present

## 2021-05-12 DIAGNOSIS — F02811 Dementia in other diseases classified elsewhere, unspecified severity, with agitation: Secondary | ICD-10-CM | POA: Diagnosis not present

## 2021-05-12 DIAGNOSIS — Z8616 Personal history of COVID-19: Secondary | ICD-10-CM | POA: Diagnosis not present

## 2021-05-12 DIAGNOSIS — E119 Type 2 diabetes mellitus without complications: Secondary | ICD-10-CM | POA: Diagnosis not present

## 2021-05-12 DIAGNOSIS — J449 Chronic obstructive pulmonary disease, unspecified: Secondary | ICD-10-CM | POA: Diagnosis not present

## 2021-05-12 DIAGNOSIS — G3183 Dementia with Lewy bodies: Secondary | ICD-10-CM | POA: Diagnosis not present

## 2021-05-17 DIAGNOSIS — J449 Chronic obstructive pulmonary disease, unspecified: Secondary | ICD-10-CM | POA: Diagnosis not present

## 2021-05-17 DIAGNOSIS — Z8616 Personal history of COVID-19: Secondary | ICD-10-CM | POA: Diagnosis not present

## 2021-05-17 DIAGNOSIS — G3183 Dementia with Lewy bodies: Secondary | ICD-10-CM | POA: Diagnosis not present

## 2021-05-17 DIAGNOSIS — F02811 Dementia in other diseases classified elsewhere, unspecified severity, with agitation: Secondary | ICD-10-CM | POA: Diagnosis not present

## 2021-05-17 DIAGNOSIS — I10 Essential (primary) hypertension: Secondary | ICD-10-CM | POA: Diagnosis not present

## 2021-05-17 DIAGNOSIS — E119 Type 2 diabetes mellitus without complications: Secondary | ICD-10-CM | POA: Diagnosis not present

## 2021-05-20 DIAGNOSIS — F02811 Dementia in other diseases classified elsewhere, unspecified severity, with agitation: Secondary | ICD-10-CM | POA: Diagnosis not present

## 2021-05-20 DIAGNOSIS — J449 Chronic obstructive pulmonary disease, unspecified: Secondary | ICD-10-CM | POA: Diagnosis not present

## 2021-05-20 DIAGNOSIS — I10 Essential (primary) hypertension: Secondary | ICD-10-CM | POA: Diagnosis not present

## 2021-05-20 DIAGNOSIS — Z8616 Personal history of COVID-19: Secondary | ICD-10-CM | POA: Diagnosis not present

## 2021-05-20 DIAGNOSIS — E119 Type 2 diabetes mellitus without complications: Secondary | ICD-10-CM | POA: Diagnosis not present

## 2021-05-20 DIAGNOSIS — G3183 Dementia with Lewy bodies: Secondary | ICD-10-CM | POA: Diagnosis not present

## 2021-05-24 DIAGNOSIS — Z8616 Personal history of COVID-19: Secondary | ICD-10-CM | POA: Diagnosis not present

## 2021-05-24 DIAGNOSIS — G3183 Dementia with Lewy bodies: Secondary | ICD-10-CM | POA: Diagnosis not present

## 2021-05-24 DIAGNOSIS — E119 Type 2 diabetes mellitus without complications: Secondary | ICD-10-CM | POA: Diagnosis not present

## 2021-05-24 DIAGNOSIS — I10 Essential (primary) hypertension: Secondary | ICD-10-CM | POA: Diagnosis not present

## 2021-05-24 DIAGNOSIS — F02811 Dementia in other diseases classified elsewhere, unspecified severity, with agitation: Secondary | ICD-10-CM | POA: Diagnosis not present

## 2021-05-24 DIAGNOSIS — J449 Chronic obstructive pulmonary disease, unspecified: Secondary | ICD-10-CM | POA: Diagnosis not present

## 2021-05-25 DIAGNOSIS — I10 Essential (primary) hypertension: Secondary | ICD-10-CM | POA: Diagnosis not present

## 2021-05-25 DIAGNOSIS — E119 Type 2 diabetes mellitus without complications: Secondary | ICD-10-CM | POA: Diagnosis not present

## 2021-05-25 DIAGNOSIS — Z8616 Personal history of COVID-19: Secondary | ICD-10-CM | POA: Diagnosis not present

## 2021-05-25 DIAGNOSIS — F02811 Dementia in other diseases classified elsewhere, unspecified severity, with agitation: Secondary | ICD-10-CM | POA: Diagnosis not present

## 2021-05-25 DIAGNOSIS — J449 Chronic obstructive pulmonary disease, unspecified: Secondary | ICD-10-CM | POA: Diagnosis not present

## 2021-05-25 DIAGNOSIS — G3183 Dementia with Lewy bodies: Secondary | ICD-10-CM | POA: Diagnosis not present

## 2021-06-02 DIAGNOSIS — G4733 Obstructive sleep apnea (adult) (pediatric): Secondary | ICD-10-CM | POA: Diagnosis not present

## 2021-06-02 DIAGNOSIS — E119 Type 2 diabetes mellitus without complications: Secondary | ICD-10-CM | POA: Diagnosis not present

## 2021-06-02 DIAGNOSIS — I10 Essential (primary) hypertension: Secondary | ICD-10-CM | POA: Diagnosis not present

## 2021-06-02 DIAGNOSIS — I739 Peripheral vascular disease, unspecified: Secondary | ICD-10-CM | POA: Diagnosis not present

## 2021-06-02 DIAGNOSIS — J449 Chronic obstructive pulmonary disease, unspecified: Secondary | ICD-10-CM | POA: Diagnosis not present

## 2021-06-02 DIAGNOSIS — E559 Vitamin D deficiency, unspecified: Secondary | ICD-10-CM | POA: Diagnosis not present

## 2021-06-02 DIAGNOSIS — I714 Abdominal aortic aneurysm, without rupture, unspecified: Secondary | ICD-10-CM | POA: Diagnosis not present

## 2021-06-02 DIAGNOSIS — G3183 Dementia with Lewy bodies: Secondary | ICD-10-CM | POA: Diagnosis not present

## 2021-06-02 DIAGNOSIS — Z8616 Personal history of COVID-19: Secondary | ICD-10-CM | POA: Diagnosis not present

## 2021-06-02 DIAGNOSIS — F02811 Dementia in other diseases classified elsewhere, unspecified severity, with agitation: Secondary | ICD-10-CM | POA: Diagnosis not present

## 2021-06-03 DIAGNOSIS — I10 Essential (primary) hypertension: Secondary | ICD-10-CM | POA: Diagnosis not present

## 2021-06-03 DIAGNOSIS — E119 Type 2 diabetes mellitus without complications: Secondary | ICD-10-CM | POA: Diagnosis not present

## 2021-06-03 DIAGNOSIS — J449 Chronic obstructive pulmonary disease, unspecified: Secondary | ICD-10-CM | POA: Diagnosis not present

## 2021-06-03 DIAGNOSIS — F02811 Dementia in other diseases classified elsewhere, unspecified severity, with agitation: Secondary | ICD-10-CM | POA: Diagnosis not present

## 2021-06-03 DIAGNOSIS — Z8616 Personal history of COVID-19: Secondary | ICD-10-CM | POA: Diagnosis not present

## 2021-06-03 DIAGNOSIS — G3183 Dementia with Lewy bodies: Secondary | ICD-10-CM | POA: Diagnosis not present

## 2021-06-07 DIAGNOSIS — J449 Chronic obstructive pulmonary disease, unspecified: Secondary | ICD-10-CM | POA: Diagnosis not present

## 2021-06-07 DIAGNOSIS — E119 Type 2 diabetes mellitus without complications: Secondary | ICD-10-CM | POA: Diagnosis not present

## 2021-06-07 DIAGNOSIS — I10 Essential (primary) hypertension: Secondary | ICD-10-CM | POA: Diagnosis not present

## 2021-06-07 DIAGNOSIS — Z8616 Personal history of COVID-19: Secondary | ICD-10-CM | POA: Diagnosis not present

## 2021-06-07 DIAGNOSIS — F02811 Dementia in other diseases classified elsewhere, unspecified severity, with agitation: Secondary | ICD-10-CM | POA: Diagnosis not present

## 2021-06-07 DIAGNOSIS — G3183 Dementia with Lewy bodies: Secondary | ICD-10-CM | POA: Diagnosis not present

## 2021-06-08 DIAGNOSIS — E119 Type 2 diabetes mellitus without complications: Secondary | ICD-10-CM | POA: Diagnosis not present

## 2021-06-08 DIAGNOSIS — Z8616 Personal history of COVID-19: Secondary | ICD-10-CM | POA: Diagnosis not present

## 2021-06-08 DIAGNOSIS — I10 Essential (primary) hypertension: Secondary | ICD-10-CM | POA: Diagnosis not present

## 2021-06-08 DIAGNOSIS — G3183 Dementia with Lewy bodies: Secondary | ICD-10-CM | POA: Diagnosis not present

## 2021-06-08 DIAGNOSIS — F02811 Dementia in other diseases classified elsewhere, unspecified severity, with agitation: Secondary | ICD-10-CM | POA: Diagnosis not present

## 2021-06-08 DIAGNOSIS — J449 Chronic obstructive pulmonary disease, unspecified: Secondary | ICD-10-CM | POA: Diagnosis not present

## 2021-06-09 DIAGNOSIS — Z8616 Personal history of COVID-19: Secondary | ICD-10-CM | POA: Diagnosis not present

## 2021-06-09 DIAGNOSIS — I10 Essential (primary) hypertension: Secondary | ICD-10-CM | POA: Diagnosis not present

## 2021-06-09 DIAGNOSIS — J449 Chronic obstructive pulmonary disease, unspecified: Secondary | ICD-10-CM | POA: Diagnosis not present

## 2021-06-09 DIAGNOSIS — F02811 Dementia in other diseases classified elsewhere, unspecified severity, with agitation: Secondary | ICD-10-CM | POA: Diagnosis not present

## 2021-06-09 DIAGNOSIS — E119 Type 2 diabetes mellitus without complications: Secondary | ICD-10-CM | POA: Diagnosis not present

## 2021-06-09 DIAGNOSIS — G3183 Dementia with Lewy bodies: Secondary | ICD-10-CM | POA: Diagnosis not present

## 2021-06-10 DIAGNOSIS — I10 Essential (primary) hypertension: Secondary | ICD-10-CM | POA: Diagnosis not present

## 2021-06-10 DIAGNOSIS — G3183 Dementia with Lewy bodies: Secondary | ICD-10-CM | POA: Diagnosis not present

## 2021-06-10 DIAGNOSIS — Z8616 Personal history of COVID-19: Secondary | ICD-10-CM | POA: Diagnosis not present

## 2021-06-10 DIAGNOSIS — J449 Chronic obstructive pulmonary disease, unspecified: Secondary | ICD-10-CM | POA: Diagnosis not present

## 2021-06-10 DIAGNOSIS — E119 Type 2 diabetes mellitus without complications: Secondary | ICD-10-CM | POA: Diagnosis not present

## 2021-06-10 DIAGNOSIS — F02811 Dementia in other diseases classified elsewhere, unspecified severity, with agitation: Secondary | ICD-10-CM | POA: Diagnosis not present

## 2021-06-13 DIAGNOSIS — F02811 Dementia in other diseases classified elsewhere, unspecified severity, with agitation: Secondary | ICD-10-CM | POA: Diagnosis not present

## 2021-06-13 DIAGNOSIS — J449 Chronic obstructive pulmonary disease, unspecified: Secondary | ICD-10-CM | POA: Diagnosis not present

## 2021-06-13 DIAGNOSIS — E119 Type 2 diabetes mellitus without complications: Secondary | ICD-10-CM | POA: Diagnosis not present

## 2021-06-13 DIAGNOSIS — Z8616 Personal history of COVID-19: Secondary | ICD-10-CM | POA: Diagnosis not present

## 2021-06-13 DIAGNOSIS — I10 Essential (primary) hypertension: Secondary | ICD-10-CM | POA: Diagnosis not present

## 2021-06-13 DIAGNOSIS — G3183 Dementia with Lewy bodies: Secondary | ICD-10-CM | POA: Diagnosis not present

## 2021-06-20 DIAGNOSIS — Z8616 Personal history of COVID-19: Secondary | ICD-10-CM | POA: Diagnosis not present

## 2021-06-20 DIAGNOSIS — E119 Type 2 diabetes mellitus without complications: Secondary | ICD-10-CM | POA: Diagnosis not present

## 2021-06-20 DIAGNOSIS — J449 Chronic obstructive pulmonary disease, unspecified: Secondary | ICD-10-CM | POA: Diagnosis not present

## 2021-06-20 DIAGNOSIS — I10 Essential (primary) hypertension: Secondary | ICD-10-CM | POA: Diagnosis not present

## 2021-06-20 DIAGNOSIS — G3183 Dementia with Lewy bodies: Secondary | ICD-10-CM | POA: Diagnosis not present

## 2021-06-20 DIAGNOSIS — F02811 Dementia in other diseases classified elsewhere, unspecified severity, with agitation: Secondary | ICD-10-CM | POA: Diagnosis not present

## 2021-06-23 DIAGNOSIS — E119 Type 2 diabetes mellitus without complications: Secondary | ICD-10-CM | POA: Diagnosis not present

## 2021-06-23 DIAGNOSIS — I10 Essential (primary) hypertension: Secondary | ICD-10-CM | POA: Diagnosis not present

## 2021-06-23 DIAGNOSIS — F02811 Dementia in other diseases classified elsewhere, unspecified severity, with agitation: Secondary | ICD-10-CM | POA: Diagnosis not present

## 2021-06-23 DIAGNOSIS — J449 Chronic obstructive pulmonary disease, unspecified: Secondary | ICD-10-CM | POA: Diagnosis not present

## 2021-06-23 DIAGNOSIS — G3183 Dementia with Lewy bodies: Secondary | ICD-10-CM | POA: Diagnosis not present

## 2021-06-23 DIAGNOSIS — Z8616 Personal history of COVID-19: Secondary | ICD-10-CM | POA: Diagnosis not present

## 2021-06-24 DIAGNOSIS — E119 Type 2 diabetes mellitus without complications: Secondary | ICD-10-CM | POA: Diagnosis not present

## 2021-06-24 DIAGNOSIS — J449 Chronic obstructive pulmonary disease, unspecified: Secondary | ICD-10-CM | POA: Diagnosis not present

## 2021-06-24 DIAGNOSIS — Z8616 Personal history of COVID-19: Secondary | ICD-10-CM | POA: Diagnosis not present

## 2021-06-24 DIAGNOSIS — I10 Essential (primary) hypertension: Secondary | ICD-10-CM | POA: Diagnosis not present

## 2021-06-24 DIAGNOSIS — G3183 Dementia with Lewy bodies: Secondary | ICD-10-CM | POA: Diagnosis not present

## 2021-06-24 DIAGNOSIS — F02811 Dementia in other diseases classified elsewhere, unspecified severity, with agitation: Secondary | ICD-10-CM | POA: Diagnosis not present

## 2021-06-27 DIAGNOSIS — F02811 Dementia in other diseases classified elsewhere, unspecified severity, with agitation: Secondary | ICD-10-CM | POA: Diagnosis not present

## 2021-06-27 DIAGNOSIS — Z8616 Personal history of COVID-19: Secondary | ICD-10-CM | POA: Diagnosis not present

## 2021-06-27 DIAGNOSIS — I10 Essential (primary) hypertension: Secondary | ICD-10-CM | POA: Diagnosis not present

## 2021-06-27 DIAGNOSIS — J449 Chronic obstructive pulmonary disease, unspecified: Secondary | ICD-10-CM | POA: Diagnosis not present

## 2021-06-27 DIAGNOSIS — E119 Type 2 diabetes mellitus without complications: Secondary | ICD-10-CM | POA: Diagnosis not present

## 2021-06-27 DIAGNOSIS — G3183 Dementia with Lewy bodies: Secondary | ICD-10-CM | POA: Diagnosis not present

## 2021-06-28 DIAGNOSIS — F02811 Dementia in other diseases classified elsewhere, unspecified severity, with agitation: Secondary | ICD-10-CM | POA: Diagnosis not present

## 2021-06-28 DIAGNOSIS — G3183 Dementia with Lewy bodies: Secondary | ICD-10-CM | POA: Diagnosis not present

## 2021-06-28 DIAGNOSIS — E119 Type 2 diabetes mellitus without complications: Secondary | ICD-10-CM | POA: Diagnosis not present

## 2021-06-28 DIAGNOSIS — I10 Essential (primary) hypertension: Secondary | ICD-10-CM | POA: Diagnosis not present

## 2021-06-28 DIAGNOSIS — J449 Chronic obstructive pulmonary disease, unspecified: Secondary | ICD-10-CM | POA: Diagnosis not present

## 2021-06-28 DIAGNOSIS — Z8616 Personal history of COVID-19: Secondary | ICD-10-CM | POA: Diagnosis not present

## 2021-06-29 DIAGNOSIS — Z8616 Personal history of COVID-19: Secondary | ICD-10-CM | POA: Diagnosis not present

## 2021-06-29 DIAGNOSIS — E119 Type 2 diabetes mellitus without complications: Secondary | ICD-10-CM | POA: Diagnosis not present

## 2021-06-29 DIAGNOSIS — J449 Chronic obstructive pulmonary disease, unspecified: Secondary | ICD-10-CM | POA: Diagnosis not present

## 2021-06-29 DIAGNOSIS — I10 Essential (primary) hypertension: Secondary | ICD-10-CM | POA: Diagnosis not present

## 2021-06-29 DIAGNOSIS — G3183 Dementia with Lewy bodies: Secondary | ICD-10-CM | POA: Diagnosis not present

## 2021-06-29 DIAGNOSIS — F02811 Dementia in other diseases classified elsewhere, unspecified severity, with agitation: Secondary | ICD-10-CM | POA: Diagnosis not present

## 2021-06-30 ENCOUNTER — Telehealth: Payer: Self-pay | Admitting: Family Medicine

## 2021-06-30 DIAGNOSIS — F329 Major depressive disorder, single episode, unspecified: Secondary | ICD-10-CM | POA: Diagnosis not present

## 2021-06-30 DIAGNOSIS — E118 Type 2 diabetes mellitus with unspecified complications: Secondary | ICD-10-CM | POA: Diagnosis not present

## 2021-06-30 DIAGNOSIS — F0390 Unspecified dementia without behavioral disturbance: Secondary | ICD-10-CM | POA: Diagnosis not present

## 2021-06-30 DIAGNOSIS — U071 COVID-19: Secondary | ICD-10-CM | POA: Diagnosis not present

## 2021-06-30 NOTE — Telephone Encounter (Signed)
Left message to call back and schedule AWV, needs after 02/21/22

## 2021-07-03 DIAGNOSIS — J449 Chronic obstructive pulmonary disease, unspecified: Secondary | ICD-10-CM | POA: Diagnosis not present

## 2021-07-03 DIAGNOSIS — E119 Type 2 diabetes mellitus without complications: Secondary | ICD-10-CM | POA: Diagnosis not present

## 2021-07-03 DIAGNOSIS — E559 Vitamin D deficiency, unspecified: Secondary | ICD-10-CM | POA: Diagnosis not present

## 2021-07-03 DIAGNOSIS — G4733 Obstructive sleep apnea (adult) (pediatric): Secondary | ICD-10-CM | POA: Diagnosis not present

## 2021-07-03 DIAGNOSIS — I10 Essential (primary) hypertension: Secondary | ICD-10-CM | POA: Diagnosis not present

## 2021-07-03 DIAGNOSIS — G3183 Dementia with Lewy bodies: Secondary | ICD-10-CM | POA: Diagnosis not present

## 2021-07-03 DIAGNOSIS — I714 Abdominal aortic aneurysm, without rupture, unspecified: Secondary | ICD-10-CM | POA: Diagnosis not present

## 2021-07-03 DIAGNOSIS — Z8616 Personal history of COVID-19: Secondary | ICD-10-CM | POA: Diagnosis not present

## 2021-07-03 DIAGNOSIS — I739 Peripheral vascular disease, unspecified: Secondary | ICD-10-CM | POA: Diagnosis not present

## 2021-07-03 DIAGNOSIS — F02811 Dementia in other diseases classified elsewhere, unspecified severity, with agitation: Secondary | ICD-10-CM | POA: Diagnosis not present

## 2021-07-05 DIAGNOSIS — I10 Essential (primary) hypertension: Secondary | ICD-10-CM | POA: Diagnosis not present

## 2021-07-05 DIAGNOSIS — Z8616 Personal history of COVID-19: Secondary | ICD-10-CM | POA: Diagnosis not present

## 2021-07-05 DIAGNOSIS — J449 Chronic obstructive pulmonary disease, unspecified: Secondary | ICD-10-CM | POA: Diagnosis not present

## 2021-07-05 DIAGNOSIS — F02811 Dementia in other diseases classified elsewhere, unspecified severity, with agitation: Secondary | ICD-10-CM | POA: Diagnosis not present

## 2021-07-05 DIAGNOSIS — G3183 Dementia with Lewy bodies: Secondary | ICD-10-CM | POA: Diagnosis not present

## 2021-07-05 DIAGNOSIS — E119 Type 2 diabetes mellitus without complications: Secondary | ICD-10-CM | POA: Diagnosis not present

## 2021-07-06 DIAGNOSIS — F02811 Dementia in other diseases classified elsewhere, unspecified severity, with agitation: Secondary | ICD-10-CM | POA: Diagnosis not present

## 2021-07-06 DIAGNOSIS — J449 Chronic obstructive pulmonary disease, unspecified: Secondary | ICD-10-CM | POA: Diagnosis not present

## 2021-07-06 DIAGNOSIS — G3183 Dementia with Lewy bodies: Secondary | ICD-10-CM | POA: Diagnosis not present

## 2021-07-06 DIAGNOSIS — Z8616 Personal history of COVID-19: Secondary | ICD-10-CM | POA: Diagnosis not present

## 2021-07-06 DIAGNOSIS — E119 Type 2 diabetes mellitus without complications: Secondary | ICD-10-CM | POA: Diagnosis not present

## 2021-07-06 DIAGNOSIS — I10 Essential (primary) hypertension: Secondary | ICD-10-CM | POA: Diagnosis not present

## 2021-07-07 DIAGNOSIS — F02811 Dementia in other diseases classified elsewhere, unspecified severity, with agitation: Secondary | ICD-10-CM | POA: Diagnosis not present

## 2021-07-07 DIAGNOSIS — Z8616 Personal history of COVID-19: Secondary | ICD-10-CM | POA: Diagnosis not present

## 2021-07-07 DIAGNOSIS — E119 Type 2 diabetes mellitus without complications: Secondary | ICD-10-CM | POA: Diagnosis not present

## 2021-07-07 DIAGNOSIS — I10 Essential (primary) hypertension: Secondary | ICD-10-CM | POA: Diagnosis not present

## 2021-07-07 DIAGNOSIS — G3183 Dementia with Lewy bodies: Secondary | ICD-10-CM | POA: Diagnosis not present

## 2021-07-07 DIAGNOSIS — J449 Chronic obstructive pulmonary disease, unspecified: Secondary | ICD-10-CM | POA: Diagnosis not present

## 2021-07-08 DIAGNOSIS — G3183 Dementia with Lewy bodies: Secondary | ICD-10-CM | POA: Diagnosis not present

## 2021-07-08 DIAGNOSIS — E119 Type 2 diabetes mellitus without complications: Secondary | ICD-10-CM | POA: Diagnosis not present

## 2021-07-08 DIAGNOSIS — I10 Essential (primary) hypertension: Secondary | ICD-10-CM | POA: Diagnosis not present

## 2021-07-08 DIAGNOSIS — Z8616 Personal history of COVID-19: Secondary | ICD-10-CM | POA: Diagnosis not present

## 2021-07-08 DIAGNOSIS — J449 Chronic obstructive pulmonary disease, unspecified: Secondary | ICD-10-CM | POA: Diagnosis not present

## 2021-07-08 DIAGNOSIS — F02811 Dementia in other diseases classified elsewhere, unspecified severity, with agitation: Secondary | ICD-10-CM | POA: Diagnosis not present

## 2021-07-11 DIAGNOSIS — I10 Essential (primary) hypertension: Secondary | ICD-10-CM | POA: Diagnosis not present

## 2021-07-11 DIAGNOSIS — Z8616 Personal history of COVID-19: Secondary | ICD-10-CM | POA: Diagnosis not present

## 2021-07-11 DIAGNOSIS — F02811 Dementia in other diseases classified elsewhere, unspecified severity, with agitation: Secondary | ICD-10-CM | POA: Diagnosis not present

## 2021-07-11 DIAGNOSIS — J449 Chronic obstructive pulmonary disease, unspecified: Secondary | ICD-10-CM | POA: Diagnosis not present

## 2021-07-11 DIAGNOSIS — E119 Type 2 diabetes mellitus without complications: Secondary | ICD-10-CM | POA: Diagnosis not present

## 2021-07-11 DIAGNOSIS — G3183 Dementia with Lewy bodies: Secondary | ICD-10-CM | POA: Diagnosis not present

## 2021-07-12 DIAGNOSIS — G3183 Dementia with Lewy bodies: Secondary | ICD-10-CM | POA: Diagnosis not present

## 2021-07-12 DIAGNOSIS — E119 Type 2 diabetes mellitus without complications: Secondary | ICD-10-CM | POA: Diagnosis not present

## 2021-07-12 DIAGNOSIS — J449 Chronic obstructive pulmonary disease, unspecified: Secondary | ICD-10-CM | POA: Diagnosis not present

## 2021-07-12 DIAGNOSIS — I10 Essential (primary) hypertension: Secondary | ICD-10-CM | POA: Diagnosis not present

## 2021-07-12 DIAGNOSIS — Z8616 Personal history of COVID-19: Secondary | ICD-10-CM | POA: Diagnosis not present

## 2021-07-12 DIAGNOSIS — F02811 Dementia in other diseases classified elsewhere, unspecified severity, with agitation: Secondary | ICD-10-CM | POA: Diagnosis not present

## 2021-08-03 DEATH — deceased
